# Patient Record
Sex: Male | Born: 1960
Health system: Southern US, Community
[De-identification: ages and names within clinical notes are randomized; demographics above are authoritative.]

## PROBLEM LIST (undated history)

## (undated) DIAGNOSIS — D500 Iron deficiency anemia secondary to blood loss (chronic): Secondary | ICD-10-CM

## (undated) DIAGNOSIS — H359 Unspecified retinal disorder: Secondary | ICD-10-CM

## (undated) DIAGNOSIS — C641 Malignant neoplasm of right kidney, except renal pelvis: Secondary | ICD-10-CM

## (undated) DIAGNOSIS — Z923 Personal history of irradiation: Secondary | ICD-10-CM

## (undated) DIAGNOSIS — M170 Bilateral primary osteoarthritis of knee: Secondary | ICD-10-CM

## (undated) DIAGNOSIS — K4090 Unilateral inguinal hernia, without obstruction or gangrene, not specified as recurrent: Secondary | ICD-10-CM

## (undated) DIAGNOSIS — K2980 Duodenitis without bleeding: Secondary | ICD-10-CM

## (undated) DIAGNOSIS — Z7189 Other specified counseling: Secondary | ICD-10-CM

## (undated) DIAGNOSIS — J189 Pneumonia, unspecified organism: Secondary | ICD-10-CM

## (undated) HISTORY — PX: HERNIA REPAIR: SHX51

## (undated) HISTORY — DX: Personal history of irradiation: Z92.3

## (undated) HISTORY — DX: Other specified counseling: Z71.89

## (undated) HISTORY — DX: Malignant neoplasm of right kidney, except renal pelvis: C64.1

## (undated) HISTORY — DX: Unilateral inguinal hernia, without obstruction or gangrene, not specified as recurrent: K40.90

## (undated) HISTORY — PX: INGUINAL HERNIA REPAIR: SUR1180

## (undated) HISTORY — DX: Bilateral primary osteoarthritis of knee: M17.0

## (undated) HISTORY — DX: Iron deficiency anemia secondary to blood loss (chronic): D50.0

---

## 2016-05-24 ENCOUNTER — Emergency Department (INDEPENDENT_AMBULATORY_CARE_PROVIDER_SITE_OTHER)
Admission: EM | Admit: 2016-05-24 | Discharge: 2016-05-24 | Disposition: A | Discharge status: Home / Self Care | Payer: BLUE CROSS/BLUE SHIELD | Source: Home / Self Care | Attending: Family Medicine | Admitting: Family Medicine

## 2016-05-24 ENCOUNTER — Encounter: Payer: Self-pay | Admitting: *Deleted

## 2016-05-24 DIAGNOSIS — L255 Unspecified contact dermatitis due to plants, except food: Secondary | ICD-10-CM

## 2016-05-24 MED ORDER — PREDNISONE 20 MG PO TABS: 20.0000 mg | ORAL_TABLET | Freq: Two times a day (BID) | ORAL | Status: DC

## 2016-05-24 MED ORDER — METHYLPREDNISOLONE SODIUM SUCC 125 MG IJ SOLR
80.0000 mg | Freq: Once | INTRAMUSCULAR | Status: AC
  Administered 2016-05-24: 80 mg via INTRAMUSCULAR

## 2016-05-24 NOTE — ED Provider Notes (Signed)
CSN: [id number]     Arrival date & time 05/24/16  1635 History   First MD Initiated Contact with Patient 05/24/16 1806     Chief Complaint  Patient presents with  . Rash      HPI Comments: Patient came into contact with poison ivy on his arms 4 days ago, and has persistent pruritic rash.  Patient is a 55 y.o. male presenting with poison ivy. The history is provided by the patient.  Poison Karlene Einstein This is a new problem. Episode onset: 4 days ago. The problem occurs constantly. The problem has been gradually worsening. Exacerbated by: sweating. Nothing relieves the symptoms. Treatments tried: anti itch cream. The treatment provided no relief.    History reviewed. No pertinent past medical history. Past Surgical History  Procedure Laterality Date  . Hernia repair     Family History  Problem Relation Age of Onset  . Heart disease Mother   . Diabetes Mother   . Heart disease Father    Social History  Substance Use Topics  . Smoking status: Never Smoker   . Smokeless tobacco: Never Used  . Alcohol Use: Yes     Comment: 0-1    Review of Systems  All other systems reviewed and are negative.   Allergies  Review of patient's allergies indicates no known allergies.  Home Medications   Prior to Admission medications   Medication Sig Start Date End Date Taking? Authorizing Provider  predniSONE (DELTASONE) 20 MG tablet Take 1 tablet (20 mg total) by mouth 2 (two) times daily. Take with food. 05/24/16   Kandra Nicolas, MD   Meds Ordered and Administered this Visit   Medications  methylPREDNISolone sodium succinate (SOLU-MEDROL) 125 mg/2 mL injection 80 mg (not administered)    BP 143/76 mmHg  Pulse 69  Temp(Src) 98.7 F (37.1 C) (Oral)  Ht 6' (1.829 m)  Wt 250 lb (113.399 kg)  BMI 33.90 kg/m2  SpO2 96% No data found.   Physical Exam  Constitutional: He is oriented to person, place, and time. He appears well-developed and well-nourished. He appears distressed.  HENT:    Head: Normocephalic.  Mouth/Throat: Oropharynx is clear and moist.  Eyes: Conjunctivae are normal. Pupils are equal, round, and reactive to light.  Neurological: He is alert and oriented to person, place, and time.  Skin: Skin is warm and dry. Rash noted. Rash is not pustular and not vesicular.     Scattered vesicular erythematous lesions on forearms and hands.  No evidence cellulitis  Nursing note and vitals reviewed.   ED Course  Procedures none  MDM   1. Rhus dermatitis    DepoMedrol 80mg  IM  Begin prednisone burtst Tuesday, May 25, 2016. May take an antihistamine such as Benadryl or Zyrtec as needed for itching. If lesions begin to weep, may apply soaks with DomeBoro solution (available non-prescription).    Kandra Nicolas, MD 05/24/16 Vernelle Emerald

## 2016-05-24 NOTE — Discharge Instructions (Signed)
Begin prednisone Tuesday, May 25, 2016. May take an antihistamine such as Benadryl or Zyrtec as needed for itching. If lesions begin to weep, may apply soaks with DomeBoro solution (available non-prescription).   Poison [employer] ivy is a rash caused by touching the leaves of the poison ivy plant. The rash often shows up 48 hours later. You might just have bumps, redness, and itching. Sometimes, blisters appear and break open. Your eyes may get puffy (swollen). Poison ivy often heals in 2 to 3 weeks without treatment. HOME CARE  If you touch poison ivy:  Wash your skin with soap and water right away. Wash under your fingernails. Do not rub the skin very hard.  Wash any clothes you were wearing.  Avoid poison ivy in the future. Poison ivy has 3 leaves on a stem.  Use medicine to help with itching as told by your doctor. Do not drive when you take this medicine.  Keep open sores dry, clean, and covered with a bandage and medicated cream, if needed.  Ask your doctor about medicine for children. GET HELP RIGHT AWAY IF:  You have open sores.  Redness spreads beyond the area of the rash.  There is yellowish white fluid (pus) coming from the rash.  Pain gets worse.  You have a temperature by mouth above 102 F (38.9 C), not controlled by medicine. MAKE SURE YOU:  Understand these instructions.  Will watch your condition.  Will get help right away if you are not doing well or get worse.   This information is not intended to replace advice given to you by your health care provider. Make sure you discuss any questions you have with your health care provider.   Document Released: 12/25/2010 Document Revised: 02/14/2012 Document Reviewed: 04/30/2015 Elsevier Interactive Patient Education [employer].

## 2016-05-24 NOTE — ED Notes (Signed)
Pt c/o possible poison ivy rash to both arms x 3 days. Blisters @ some sites. Used otc anti-itch cream with minimal relief. He is going out of the country in 6 days.

## 2016-06-06 ENCOUNTER — Encounter: Payer: Self-pay | Admitting: Emergency Medicine

## 2016-06-06 ENCOUNTER — Emergency Department (INDEPENDENT_AMBULATORY_CARE_PROVIDER_SITE_OTHER)
Admission: EM | Admit: 2016-06-06 | Discharge: 2016-06-06 | Disposition: A | Discharge status: Home / Self Care | Payer: BLUE CROSS/BLUE SHIELD | Source: Home / Self Care | Attending: Family Medicine | Admitting: Family Medicine

## 2016-06-06 DIAGNOSIS — L259 Unspecified contact dermatitis, unspecified cause: Secondary | ICD-10-CM

## 2016-06-06 MED ORDER — DOXYCYCLINE HYCLATE 100 MG PO CAPS: 100.0000 mg | ORAL_CAPSULE | Freq: Two times a day (BID) | ORAL | Status: DC

## 2016-06-06 MED ORDER — HYDROXYZINE HCL 25 MG PO TABS: 25.0000 mg | ORAL_TABLET | Freq: Four times a day (QID) | ORAL | Status: DC | PRN

## 2016-06-06 MED ORDER — CLOBETASOL PROPIONATE 0.05 % EX CREA: 1.0000 "application " | TOPICAL_CREAM | Freq: Two times a day (BID) | CUTANEOUS | Status: DC

## 2016-06-06 NOTE — ED Provider Notes (Signed)
CSN: [id number]     Arrival date & time 06/06/16  1107 History   First MD Initiated Contact with Patient 06/06/16 1131     Chief Complaint  Patient presents with  . Rash   (Consider location/radiation/quality/duration/timing/severity/associated sxs/prior Treatment) HPI Alexander Fry is a 55 y.o. male presenting to UC with c/o persistent erythematous, moderately pruritic rash to arms and now a few spots on his legs. Pt notes he was seen at [facility] 2 weeks ago, given a shot of steroids and discharged with prednisone but denies relief of symptoms.  He notes he did spend the last 1 week in [place] and was staying in a hotel so was unable to wash clothes but does not believe rash is from being down there. Denies fever, chills, n/v/d. Denies difficulty breathing or swallowing. No new soaps, lotions, or medications.   History reviewed. No pertinent past medical history. Past Surgical History  Procedure Laterality Date  . Hernia repair     Family History  Problem Relation Age of Onset  . Heart disease Mother   . Diabetes Mother   . Heart disease Father    Social History  Substance Use Topics  . Smoking status: Never Smoker   . Smokeless tobacco: Never Used  . Alcohol Use: Yes     Comment: 0-1    Review of Systems  Constitutional: Negative for fever and chills.  Respiratory: Negative for chest tightness, shortness of breath, wheezing and stridor.   Gastrointestinal: Negative for nausea, vomiting and diarrhea.    Allergies  Review of patient's allergies indicates no known allergies.  Home Medications   Prior to Admission medications   Medication Sig Start Date End Date Taking? Authorizing Provider  clobetasol cream (TEMOVATE) [id number] % Apply 1 application topically 2 (two) times daily. 06/06/16   Noland Fordyce, PA-C  doxycycline (VIBRAMYCIN) 100 MG capsule Take 1 capsule (100 mg total) by mouth 2 (two) times daily. One po bid x 7 days 06/06/16   Noland Fordyce, PA-C  hydrOXYzine  (ATARAX/VISTARIL) 25 MG tablet Take 1 tablet (25 mg total) by mouth every 6 (six) hours as needed for itching. 06/06/16   Noland Fordyce, PA-C  predniSONE (DELTASONE) 20 MG tablet Take 1 tablet (20 mg total) by mouth 2 (two) times daily. Take with food. 05/24/16   Kandra Nicolas, MD   Meds Ordered and Administered this Visit  Medications - No data to display  BP 122/77 mmHg  Pulse 78  Temp(Src) 98.6 F (37 C) (Oral)  Ht 6' (1.829 m)  Wt 244 lb 8 oz (110.904 kg)  BMI 33.15 kg/m2  SpO2 93% No data found.   Physical Exam  Constitutional: He is oriented to person, place, and time. He appears well-developed and well-nourished.  HENT:  Head: Normocephalic and atraumatic.  Mouth/Throat: Oropharynx is clear and moist.  Eyes: EOM are normal.  Neck: Normal range of motion.  Cardiovascular: Normal rate.   Pulmonary/Chest: Effort normal. No stridor. No respiratory distress.  Musculoskeletal: Normal range of motion.  Neurological: He is alert and oriented to person, place, and time.  Skin: Skin is warm and dry. Rash noted. There is erythema.  Scattered erythematous vesicular rash on both forearms and sparse appearance on lower legs. Worst of rash on Left wrist, multiple grouped vesicles, dried yellow discharge, 2in diameter of grouped lesions.  Psychiatric: He has a normal mood and affect. His behavior is normal.  Nursing note and vitals reviewed.   ED Course  Procedures (including critical care time)  Labs Review Labs Reviewed - No data to display  Imaging Review No results found.    MDM   1. Contact dermatitis    Pt c/o persistent erythematous pruritic rash reports mild relief of itching from injection of depomedrol but declining additional IM steroids or oral steroids as he states "they do not work"  Encouraged to make sure all clothing and linens including sheets and towels that may have oils on be washed. Keep rashes clean with soap and water.  Rx: clobetasol cream,  doxycycline to cover for secondary skin infection on Left wrist, and atarax   F/u with PCP in 1 week if not improving, sooner if worsening. Patient verbalized understanding and agreement with treatment plan.    Noland Fordyce, PA-C 06/06/16 1440

## 2016-06-06 NOTE — Discharge Instructions (Signed)
Atarax (hydroxizine) is an antihistamine that can be taken to help with itching and inflammation. This medication can cause drowsiness so do not drive or drink alcohol while taking.     Contact Dermatitis Dermatitis is redness, soreness, and swelling (inflammation) of the skin. Contact dermatitis is a reaction to certain substances that touch the skin. There are two types of contact dermatitis:   Irritant contact dermatitis. This type is caused by something that irritates your skin, such as dry hands from washing them too much. This type does not require previous exposure to the substance for a reaction to occur. This type is more common.  Allergic contact dermatitis. This type is caused by a substance that you are allergic to, such as a nickel allergy or poison ivy. This type only occurs if you have been exposed to the substance (allergen) before. Upon a repeat exposure, your body reacts to the substance. This type is less common. CAUSES  Many different substances can cause contact dermatitis. Irritant contact dermatitis is most commonly caused by exposure to:   Makeup.   Soaps.   Detergents.   Bleaches.   Acids.   Metal salts, such as nickel.  Allergic contact dermatitis is most commonly caused by exposure to:   Poisonous plants.   Chemicals.   Jewelry.   Latex.   Medicines.   Preservatives in products, such as clothing.  RISK FACTORS This condition is more likely to develop in:   People who have jobs that expose them to irritants or allergens.  People who have certain medical conditions, such as asthma or eczema.  SYMPTOMS  Symptoms of this condition may occur anywhere on your body where the irritant has touched you or is touched by you. Symptoms include:  Dryness or flaking.   Redness.   Cracks.   Itching.   Pain or a burning feeling.   Blisters.  Drainage of small amounts of blood or clear fluid from skin cracks. With allergic contact  dermatitis, there may also be swelling in areas such as the eyelids, mouth, or genitals.  DIAGNOSIS  This condition is diagnosed with a medical history and physical exam. A patch skin test may be performed to help determine the cause. If the condition is related to your job, you may need to see an occupational medicine specialist. TREATMENT Treatment for this condition includes figuring out what caused the reaction and protecting your skin from further contact. Treatment may also include:   Steroid creams or ointments. Oral steroid medicines may be needed in more severe cases.  Antibiotics or antibacterial ointments, if a skin infection is present.  Antihistamine lotion or an antihistamine taken by mouth to ease itching.  A bandage (dressing). HOME CARE INSTRUCTIONS Skin Care  Moisturize your skin as needed.   Apply cool compresses to the affected areas.  Try taking a bath with:  Epsom salts. Follow the instructions on the packaging. You can get these at your local pharmacy or grocery store.  Baking soda. Pour a small amount into the bath as directed by your health care provider.  Colloidal oatmeal. Follow the instructions on the packaging. You can get this at your local pharmacy or grocery store.  Try applying baking soda paste to your skin. Stir water into baking soda until it reaches a paste-like consistency.  Do not scratch your skin.  Bathe less frequently, such as every other day.  Bathe in lukewarm water. Avoid using hot water. Medicines  Take or apply over-the-counter and prescription medicines only  as told by your health care provider.   If you were prescribed an antibiotic medicine, take or apply your antibiotic as told by your health care provider. Do not stop using the antibiotic even if your condition starts to improve. General Instructions  Keep all follow-up visits as told by your health care provider. This is important.  Avoid the substance that caused  your reaction. If you do not know what caused it, keep a journal to try to track what caused it. Write down:  What you eat.  What cosmetic products you use.  What you drink.  What you wear in the affected area. This includes jewelry.  If you were given a dressing, take care of it as told by your health care provider. This includes when to change and remove it. SEEK MEDICAL CARE IF:   Your condition does not improve with treatment.  Your condition gets worse.  You have signs of infection such as swelling, tenderness, redness, soreness, or warmth in the affected area.  You have a fever.  You have new symptoms. SEEK IMMEDIATE MEDICAL CARE IF:   You have a severe headache, neck pain, or neck stiffness.  You vomit.  You feel very sleepy.  You notice red streaks coming from the affected area.  Your bone or joint underneath the affected area becomes painful after the skin has healed.  The affected area turns darker.  You have difficulty breathing.   This information is not intended to replace advice given to you by your health care provider. Make sure you discuss any questions you have with your health care provider.   Document Released: 11/19/2000 Document Revised: 08/13/2015 Document Reviewed: 04/09/2015 Elsevier Interactive Patient Education 2016 [employer].  Allergies An allergy is when your body reacts to a substance in a way that is not normal. An allergic reaction can happen after you:  Eat something.  Breathe in something.  Touch something. WHAT KINDS OF ALLERGIES ARE THERE? You can be allergic to:  Things that are only around during certain seasons, like molds and pollens.  Foods.  Drugs.  Insects.  Animal dander. WHAT ARE SYMPTOMS OF ALLERGIES?  Puffiness (swelling). This may happen on the lips, face, tongue, mouth, or throat.  Sneezing.  Coughing.  Breathing loudly (wheezing).  Stuffy nose.  Tingling in the mouth.  A  rash.  Itching.  Itchy, red, puffy areas of skin (hives).  Watery eyes.  Throwing up (vomiting).  Watery poop (diarrhea).  Dizziness.  Feeling faint or fainting.  Trouble breathing or swallowing.  A tight feeling in the chest.  A fast heartbeat. HOW ARE ALLERGIES DIAGNOSED? Allergies can be diagnosed with:  A medical and family history.  Skin tests.  Blood tests.  A food diary. A food diary is a record of all the foods, drinks, and symptoms you have each day.  The results of an elimination diet. This diet involves making sure not to eat certain foods and then seeing what happens when you start eating them again. HOW ARE ALLERGIES TREATED? There is no cure for allergies, but allergic reactions can be treated with medicine. Severe reactions usually need to be treated at a hospital.  HOW CAN REACTIONS BE PREVENTED? The best way to prevent an allergic reaction is to avoid the thing you are allergic to. Allergy shots and medicines can also help prevent reactions in some cases.   This information is not intended to replace advice given to you by your health care provider. Make sure  you discuss any questions you have with your health care provider.   Document Released: 03/19/2013 Document Revised: 12/13/2014 Document Reviewed: 09/03/2014 Elsevier Interactive Patient Education 2016 [employer]  Allergy Skin Testing WHY AM I HAVING THIS TEST? Allergy skin testing is done to check whether you have an allergy to something. Testing may be done in one of two ways:  Injecting a small amount of the substance you may be allergic to (allergen).  Applying patches to your skin. Your health care provider will determine the results of your test by checking for an allergic reaction on the skin where the allergen was injected or where the patches were applied.  HOW DO I PREPARE FOR THE TEST?  Let your health care provider know about all medicines you are taking, including vitamins,  herbs, eye drops, creams, and over-the-counter medicines. Some medicines can affect test results. Your health care provider will let you know when to stop taking those medicines and when you can begin taking them again.  If you are having a patch test:  Do not apply ointments, creams, or lotion to the skin where the patch will be placed. Usually, the patches are placed on your forearm or on your back.  Bring any items that you think you are allergic to, such as cosmetics, soaps, and perfume. WILL I NEED TO DO ANYTHING AT HOME? If you will receive an injection, you will not need to do anything at home. If patches will be applied to your skin, you will need to:  Wear them for 48 hours.  Return to your health care provider's office to have them removed. Do not remove them yourself.  Avoid bathing and activities that cause heavy sweating until after the patches are removed. WHAT ARE THE REFERENCE RANGES? Reference ranges are considered healthy ranges established after testing a large group of healthy people. Reference ranges may vary among different people, labs, and hospitals.  The reference range for allergy skin testing is a swollen area of skin (wheal) less than 65mm in diameter, with surrounding redness and swelling (flare) less than 18mm in diameter.  WHAT DO THE RESULTS MEAN?  A result within the reference range means you are probably not allergic to the allergen.  A result in which the wheal is 30mm or more and the flare is 35mm or more means you are likely allergic to the allergen. Your health care provider will consider the results of your test in addition to your symptoms before diagnosing you with an allergy. Talk with your health care provider to discuss your results, treatment options, and if necessary, the need for more tests. Talk with your health care provider if you have any questions about your results.   This information is not intended to replace advice given to you by your  health care provider. Make sure you discuss any questions you have with your health care provider.   Document Released: 12/15/2004 Document Revised: 12/13/2014 Document Reviewed: 09/03/2014 Elsevier Interactive Patient Education [employer].

## 2016-06-06 NOTE — ED Notes (Signed)
Pt c/o rash unsure if it's poison oak or poison ivy x 2 weeks, itching, no relief, no change w/Prednisone 20mg .  Rash in on arms.

## 2016-12-09 ENCOUNTER — Encounter: Payer: Self-pay | Admitting: Emergency Medicine

## 2016-12-09 ENCOUNTER — Emergency Department (INDEPENDENT_AMBULATORY_CARE_PROVIDER_SITE_OTHER): Payer: BLUE CROSS/BLUE SHIELD

## 2016-12-09 ENCOUNTER — Emergency Department (INDEPENDENT_AMBULATORY_CARE_PROVIDER_SITE_OTHER)
Admission: EM | Admit: 2016-12-09 | Discharge: 2016-12-09 | Disposition: A | Discharge status: Home / Self Care | Payer: BLUE CROSS/BLUE SHIELD | Source: Home / Self Care | Attending: Family Medicine | Admitting: Family Medicine

## 2016-12-09 DIAGNOSIS — Z760 Encounter for issue of repeat prescription: Secondary | ICD-10-CM

## 2016-12-09 DIAGNOSIS — R9389 Abnormal findings on diagnostic imaging of other specified body structures: Secondary | ICD-10-CM

## 2016-12-09 DIAGNOSIS — R05 Cough: Secondary | ICD-10-CM | POA: Diagnosis not present

## 2016-12-09 DIAGNOSIS — R053 Chronic cough: Secondary | ICD-10-CM

## 2016-12-09 DIAGNOSIS — S39012A Strain of muscle, fascia and tendon of lower back, initial encounter: Secondary | ICD-10-CM

## 2016-12-09 MED ORDER — HYDROCODONE-ACETAMINOPHEN 5-325 MG PO TABS: 1.0000 | ORAL_TABLET | Freq: Four times a day (QID) | ORAL | 0 refills | Status: DC | PRN

## 2016-12-09 MED ORDER — TERBINAFINE HCL 250 MG PO TABS: 250.0000 mg | ORAL_TABLET | Freq: Every day | ORAL | 0 refills | Status: DC

## 2016-12-09 MED ORDER — BENZONATATE 100 MG PO CAPS: 100.0000 mg | ORAL_CAPSULE | Freq: Three times a day (TID) | ORAL | 0 refills | Status: DC

## 2016-12-09 MED ORDER — CYCLOBENZAPRINE HCL 10 MG PO TABS: 10.0000 mg | ORAL_TABLET | Freq: Two times a day (BID) | ORAL | 0 refills | Status: DC | PRN

## 2016-12-09 NOTE — ED Provider Notes (Signed)
CSN: [id number]     Arrival date & time 12/09/16  [id number] History   First MD Initiated Contact with Patient 12/09/16 [phone]     Chief Complaint  Patient presents with  . Back Pain  . Cough   (Consider location/radiation/quality/duration/timing/severity/associated sxs/prior Treatment) HPI  Taliq Chilcoat is a 56 y.o. male presenting to UC with c/o cough that is mildly productive for about 3 months, and now Right lower back pain that is aching and sore, sharp on occasions while lying down. Back pain has been present for about 1 week, worse yesterday.  Hx of back strain a few years ago. Denies radiation of pain or numbness in arms or legs.  For the cough, he has taken Benadryl, which seems to have helped but cough is becoming more productive.  Denies fever, chills, n/v/d.   Denies chest pain or SOB. No hx of asthma or COPD. Denies change in bowel or bladder habits.  Pt also requesting a refill on his terbinafine as he has a hx of toenail fungus that has improved on Right foot but he feels he needs more medication to help with the nail on his Left foot.  Pt is new to area and has not established with a PCP yet.   History reviewed. No pertinent past medical history. Past Surgical History:  Procedure Laterality Date  . HERNIA REPAIR     Family History  Problem Relation Age of Onset  . Heart disease Mother   . Diabetes Mother   . Heart disease Father    Social History  Substance Use Topics  . Smoking status: Never Smoker  . Smokeless tobacco: Never Used  . Alcohol use Yes     Comment: 0-1    Review of Systems  Constitutional: Negative for chills and fever.  HENT: Positive for congestion and postnasal drip. Negative for ear pain, sore throat, trouble swallowing and voice change.   Respiratory: Positive for cough. Negative for shortness of breath.   Cardiovascular: Negative for chest pain and palpitations.  Gastrointestinal: Negative for abdominal pain, diarrhea, nausea and vomiting.   Musculoskeletal: Positive for back pain and myalgias. Negative for arthralgias.  Skin: Negative for rash.  Neurological: Negative for dizziness, light-headedness and headaches.    Allergies  Patient has no known allergies.  Home Medications   Prior to Admission medications   Medication Sig Start Date End Date Taking? Authorizing Provider  benzonatate (TESSALON) 100 MG capsule Take 1-2 capsules (100-200 mg total) by mouth every 8 (eight) hours. 12/09/16   Noland Fordyce, PA-C  cyclobenzaprine (FLEXERIL) 10 MG tablet Take 1 tablet (10 mg total) by mouth 2 (two) times daily as needed. 12/09/16   Noland Fordyce, PA-C  HYDROcodone-acetaminophen (NORCO/VICODIN) 5-325 MG tablet Take 1-2 tablets by mouth every 6 (six) hours as needed for severe pain. 12/09/16   Noland Fordyce, PA-C  terbinafine (LAMISIL) 250 MG tablet Take 1 tablet (250 mg total) by mouth daily. 12/09/16   Noland Fordyce, PA-C   Meds Ordered and Administered this Visit  Medications - No data to display  Ht 6' (1.829 m)   Wt 234 lb (106.1 kg)   BMI 31.74 kg/m  No data found.   Physical Exam  Constitutional: He is oriented to person, place, and time. He appears well-developed and well-nourished. No distress.  HENT:  Head: Normocephalic and atraumatic.  Right Ear: Tympanic membrane normal.  Left Ear: Tympanic membrane normal.  Nose: Nose normal.  Mouth/Throat: Uvula is midline, oropharynx is clear and moist and mucous  membranes are normal.  Eyes: EOM are normal.  Neck: Normal range of motion. Neck supple.  Cardiovascular: Normal rate and regular rhythm.   Pulmonary/Chest: Effort normal and breath sounds normal. No stridor. No respiratory distress. He has no wheezes. He has no rales.  Musculoskeletal: Normal range of motion. He exhibits tenderness. He exhibits no edema.  No midline spinal tenderness. Tenderness to Right lower lumbar muscles.   Lymphadenopathy:    He has no cervical adenopathy.  Neurological: He is alert and  oriented to person, place, and time.  Skin: Skin is warm and dry. No rash noted. He is not diaphoretic. No erythema.  Psychiatric: He has a normal mood and affect. His behavior is normal.  Nursing note and vitals reviewed.   Urgent Care Course   Clinical Course     Procedures (including critical care time)  Labs Review Labs Reviewed - No data to display  Imaging Review Dg Chest 2 View  Result Date: 12/09/2016 CLINICAL DATA:  Cough for past 3 months EXAM: CHEST  2 VIEW COMPARISON:  None in PACs FINDINGS: The lungs are adequately inflated. There is hilar prominence predominantly on the right but to a lesser extent on the left. The perihilar lung markings are increased as well. The cardiac silhouette is enlarged. The trachea is midline. There is no pleural effusion. There is soft tissue fullness along the left lower lateral ribcage that is of uncertain etiology. There is faint calcification in the wall of the aortic arch. IMPRESSION: Bilateral hilar prominence with perihilar interstitial prominence consistent with lymphadenopathy with mild interstitial infiltrate or noncardiac edema. Given the patient's chronic persistent cough, contrast-enhanced chest CT scanning is recommended to exclude occult malignancy and to further evaluate the mediastinum, hila, and pulmonary interstitium. Electronically Signed   By: David  Martinique M.D.   On: 12/09/2016 10:08     MDM   1. Chronic cough   2. Low back strain, initial encounter   3. Abnormal chest x-ray   4. Medication refill    Pt c/o cough for 3 months and now has Right lower back pain he believes from his cough.  Cough somewhat improving with Benadryl.  CXR: bilateral hilar prominence c/w lymphadenopathy Recommend contrast-enhanced chest CT   Consulted with Dr. Madilyn Fireman, Primary Care, recommends pt establish care next early next week with PCP for CT scan and further evaluation.  Pt provided copy of CXR results and strongly advised to f/u next  week with PCP for further evaluation and management of cough.  Back pain likely due to muscle strain, will treat with pain medication and muscle relaxer. May have ibuprofen OTC.  Patient verbalized understanding and agreement with treatment plan.     Noland Fordyce, PA-C 12/09/16 [place], PA-C 12/09/16 1154

## 2016-12-09 NOTE — Discharge Instructions (Signed)
°  Norco/Vicodin (hydrocodone-acetaminophen) is a narcotic pain medication, do not combine these medications with others containing tylenol. While taking, do not drink alcohol, drive, or perform any other activities that requires focus while taking these medications.   Flexeril is a muscle relaxer and may cause drowsiness. Do not drink alcohol, drive, or operate heavy machinery while taking.

## 2016-12-09 NOTE — ED Triage Notes (Signed)
LBP, mostly right side x 1 week Cough dry x 3 months

## 2016-12-10 ENCOUNTER — Ambulatory Visit (INDEPENDENT_AMBULATORY_CARE_PROVIDER_SITE_OTHER): Payer: BLUE CROSS/BLUE SHIELD | Admitting: Physician Assistant

## 2016-12-10 ENCOUNTER — Ambulatory Visit (INDEPENDENT_AMBULATORY_CARE_PROVIDER_SITE_OTHER): Payer: BLUE CROSS/BLUE SHIELD

## 2016-12-10 ENCOUNTER — Encounter: Payer: Self-pay | Admitting: Physician Assistant

## 2016-12-10 VITALS — BP 116/74 | HR 101 | Ht 71.0 in | Wt 238.0 lb

## 2016-12-10 DIAGNOSIS — K8020 Calculus of gallbladder without cholecystitis without obstruction: Secondary | ICD-10-CM

## 2016-12-10 DIAGNOSIS — K769 Liver disease, unspecified: Secondary | ICD-10-CM | POA: Diagnosis not present

## 2016-12-10 DIAGNOSIS — R9389 Abnormal findings on diagnostic imaging of other specified body structures: Secondary | ICD-10-CM | POA: Insufficient documentation

## 2016-12-10 DIAGNOSIS — N401 Enlarged prostate with lower urinary tract symptoms: Secondary | ICD-10-CM | POA: Diagnosis not present

## 2016-12-10 DIAGNOSIS — R590 Localized enlarged lymph nodes: Secondary | ICD-10-CM | POA: Insufficient documentation

## 2016-12-10 DIAGNOSIS — R938 Abnormal findings on diagnostic imaging of other specified body structures: Secondary | ICD-10-CM

## 2016-12-10 DIAGNOSIS — Z0000 Encounter for general adult medical examination without abnormal findings: Secondary | ICD-10-CM

## 2016-12-10 DIAGNOSIS — R351 Nocturia: Secondary | ICD-10-CM

## 2016-12-10 DIAGNOSIS — R918 Other nonspecific abnormal finding of lung field: Secondary | ICD-10-CM | POA: Insufficient documentation

## 2016-12-10 LAB — TSH: TSH: 3.21 mIU/L (ref 0.40–4.50)

## 2016-12-10 LAB — COMPREHENSIVE METABOLIC PANEL
ALK PHOS: 103 U/L (ref 40–115)
ALT: 15 U/L (ref 9–46)
AST: 17 U/L (ref 10–35)
Albumin: 3.4 g/dL — ABNORMAL LOW (ref 3.6–5.1)
BUN: 16 mg/dL (ref 7–25)
CO2: 26 mmol/L (ref 20–31)
CREATININE: 0.64 mg/dL — AB (ref 0.70–1.33)
Calcium: 9 mg/dL (ref 8.6–10.3)
Chloride: 99 mmol/L (ref 98–110)
Glucose, Bld: 80 mg/dL (ref 65–99)
Potassium: 4.1 mmol/L (ref 3.5–5.3)
SODIUM: 138 mmol/L (ref 135–146)
TOTAL PROTEIN: 6.9 g/dL (ref 6.1–8.1)
Total Bilirubin: 0.6 mg/dL (ref 0.2–1.2)

## 2016-12-10 LAB — LIPID PANEL
CHOL/HDL RATIO: 3.9 ratio (ref <5.0)
Cholesterol: 153 mg/dL (ref <200)
HDL: 39 mg/dL — ABNORMAL LOW (ref >40)
LDL CALC: 95 mg/dL (ref <100)
TRIGLYCERIDES: 93 mg/dL (ref <150)
VLDL: 19 mg/dL (ref <30)

## 2016-12-10 LAB — CBC
HEMATOCRIT: 35.2 % — AB (ref 38.5–50.0)
Hemoglobin: 11.5 g/dL — ABNORMAL LOW (ref 13.2–17.1)
MCH: 26.2 pg — AB (ref 27.0–33.0)
MCHC: 32.7 g/dL (ref 32.0–36.0)
MCV: 80.2 fL (ref 80.0–100.0)
MPV: 8.5 fL (ref 7.5–12.5)
Platelets: 418 10*3/uL — ABNORMAL HIGH (ref 140–400)
RBC: 4.39 MIL/uL (ref 4.20–5.80)
RDW: 14.5 % (ref 11.0–15.0)
WBC: 9.3 10*3/uL (ref 3.8–10.8)

## 2016-12-10 LAB — HEMOGLOBIN A1C
HEMOGLOBIN A1C: 5.4 % (ref <5.7)
Mean Plasma Glucose: 108 mg/dL

## 2016-12-10 MED ORDER — TAMSULOSIN HCL 0.4 MG PO CAPS: 0.4000 mg | ORAL_CAPSULE | Freq: Every day | ORAL | 3 refills | Status: DC

## 2016-12-10 MED ORDER — IOPAMIDOL (ISOVUE-300) INJECTION 61%
75.0000 mL | Freq: Once | INTRAVENOUS | Status: AC | PRN
  Administered 2016-12-10: 75 mL via INTRAVENOUS

## 2016-12-10 NOTE — Patient Instructions (Signed)
Please go downstairs for your Cat Scan Afterwards, go to the lab for blood work  Benign Prostatic Hyperplasia An enlarged prostate (benign prostatic hyperplasia) is common in older men. You may experience the following:  Weak urine stream.  Dribbling.  Feeling like the bladder has not emptied completely.  Difficulty starting urination.  Getting up frequently at night to urinate.  Urinating more frequently during the day. HOME CARE INSTRUCTIONS  Monitor your prostatic hyperplasia for any changes. The following actions may help to alleviate any discomfort you are experiencing:  Give yourself time when you urinate.  Stay away from alcohol.  Avoid beverages containing caffeine, such as coffee, tea, and colas, because they can make the problem worse.  Avoid decongestants, antihistamines, and some prescription medicines that can make the problem worse.  Follow up with your health care provider for further treatment as recommended. SEEK MEDICAL CARE IF:  You are experiencing progressive difficulty voiding.  Your urine stream is progressively getting narrower.  You are awaking from sleep with the urge to void more frequently.  You are constantly feeling the need to void.  You experience loss of urine, especially in small amounts. SEEK IMMEDIATE MEDICAL CARE IF:   You develop increased pain with urination or are unable to urinate.  You develop severe abdominal pain, vomiting, a high fever, or fainting.  You develop back pain or blood in your urine. MAKE SURE YOU:   Understand these instructions.  Will watch your condition.  Will get help right away if you are not doing well or get worse. This information is not intended to replace advice given to you by your health care provider. Make sure you discuss any questions you have with your health care provider. Document Released: 11/22/2005 Document Revised: 12/13/2014 Document Reviewed: 04/24/2013 Elsevier Interactive  Patient Education  2017 [employer].

## 2016-12-10 NOTE — Progress Notes (Signed)
HPI:                                                                Alexander Fry is a 56 y.o. male who presents to [facility]: [place] today to establish care and follow-up on an abnormal CXR  Patient was seen yesterday in urgent care for a chronic cough x 3 months. CXR showed bilateral hilar and perihilar adenopathy and CT chest was recommended.  Patient also complains of urinary symptoms. Endorses nocturia and difficulty starting to void.   Patient denies any other medical problems.  Patient recently relocated from [place]. He has not seen a PCP since 2016. He travels frequently for business.  Health Maintenance Health Maintenance  Topic Date Due  . Hepatitis C Screening  1961-12-03  . HIV Screening  07/22/1975  . COLONOSCOPY  07/21/2010  . INFLUENZA VACCINE  07/06/2016  . TETANUS/TDAP  12/06/2021  Has never had a colonoscopy  Past Medical History:  Diagnosis Date  . Inguinal hernia of left side without obstruction or gangrene    Past Surgical History:  Procedure Laterality Date  . HERNIA REPAIR    . INGUINAL HERNIA REPAIR Left    Social History  Substance Use Topics  . Smoking status: Never Smoker  . Smokeless tobacco: Never Used  . Alcohol use Yes     Comment: 0-1   family history includes Alcohol abuse in his brother; Diabetes in his mother; Heart disease in his father and mother.  Review of Systems  Constitutional: Negative for chills, fever and malaise/fatigue.  Respiratory: Positive for cough and sputum production.   Cardiovascular: Negative for chest pain and palpitations.  Gastrointestinal: Negative for abdominal pain.  Genitourinary: Positive for frequency. Negative for hematuria.  Musculoskeletal: Positive for back pain and myalgias (left rib).  Skin: Negative.   Neurological: Negative.      Medications: Current Outpatient Prescriptions  Medication Sig Dispense Refill  . benzonatate (TESSALON) 100 MG capsule  Take 1-2 capsules (100-200 mg total) by mouth every 8 (eight) hours. 21 capsule 0  . cyclobenzaprine (FLEXERIL) 10 MG tablet Take 1 tablet (10 mg total) by mouth 2 (two) times daily as needed. 20 tablet 0  . HYDROcodone-acetaminophen (NORCO/VICODIN) 5-325 MG tablet Take 1-2 tablets by mouth every 6 (six) hours as needed for severe pain. 12 tablet 0  . terbinafine (LAMISIL) 250 MG tablet Take 250 mg by mouth daily.    . tamsulosin (FLOMAX) 0.4 MG CAPS capsule Take 1 capsule (0.4 mg total) by mouth daily. 30 capsule 3   No current facility-administered medications for this visit.    No Known Allergies     Objective:  BP 116/74   Pulse (!) 101   Ht 5\' 11"  (1.803 m)   Wt 238 lb (108 kg)   BMI 33.19 kg/m  Gen: well-groomed, cooperative, not ill-appearing, no distress HEENT: normal conjunctiva, TM's clear, oropharynx clear, moist mucus membranes, no thyromegaly or tenderness Lungs: Normal work of breathing, clear to auscultation bilaterally, no wheezes, rales or rhonchi Heart: Rate 84, regular rhythm, s1 and s2 distinct, no murmurs, clicks or rubs appreciated on this exam, no carotid bruit Abd: Soft. Nondistended, Nontender Neuro: alert and oriented x 3, EOM's intact, PERRLA, DTR's intact MSK: strength 5/5 and  symmetric, normal gait Extremities: distal pulses intact, no peripheral edema Skin: warm and dry, no rashes or lesions on exposed skin Psych: normal affect, pleasant mood, normal speech and thought content    Dg Chest 2 View  Result Date: 12/09/2016 CLINICAL DATA:  Cough for past 3 months EXAM: CHEST  2 VIEW COMPARISON:  None in PACs FINDINGS: The lungs are adequately inflated. There is hilar prominence predominantly on the right but to a lesser extent on the left. The perihilar lung markings are increased as well. The cardiac silhouette is enlarged. The trachea is midline. There is no pleural effusion. There is soft tissue fullness along the left lower lateral ribcage that is of  uncertain etiology. There is faint calcification in the wall of the aortic arch. IMPRESSION: Bilateral hilar prominence with perihilar interstitial prominence consistent with lymphadenopathy with mild interstitial infiltrate or noncardiac edema. Given the patient's chronic persistent cough, contrast-enhanced chest CT scanning is recommended to exclude occult malignancy and to further evaluate the mediastinum, hila, and pulmonary interstitium. Electronically Signed   By: David  Martinique M.D.   On: 12/09/2016 10:08   Ct Chest W Contrast  Result Date: 12/10/2016 CLINICAL DATA:  Cough for past 3 months.  Abnormal chest x-ray EXAM: CT CHEST WITH CONTRAST TECHNIQUE: Multidetector CT imaging of the chest was performed during intravenous contrast administration. CONTRAST:  67mL ISOVUE-300 IOPAMIDOL (ISOVUE-300) INJECTION 61% COMPARISON:  12/09/2016 FINDINGS: Cardiovascular: Heart is normal size. Aorta is normal caliber. Mediastinum/Nodes: Extensive mediastinal and bilateral hilar adenopathy. Subcarinal lymph node has a short axis diameter of 3 cm. Right axillary lymph node has a short axis diameter of 3.1 cm. Left hilar lymph node has a short axis diameter of 2 cm. Posterior mediastinal adenopathy adjacent to the mid esophagus. No axillary adenopathy. Lungs/Pleura: There are numerous bilateral pulmonary nodules concerning for metastatic disease. The largest nodule on the right is in the right lower lobe where there is an elongated nodule measuring 2.6 x 1.3 cm on image 80. Largest nodule on the left is in the superior segment of the left lower lobe posteriorly measuring up to 1.5 cm. There is also a pleural or subpleural masslike area noted posteriorly in the right left lower hemithorax measuring up to 2.8 cm on image 44. No pleural effusions. Upper Abdomen: Low-density lesion in the right hepatic lobe measures 2.5 cm on image 53. Gallstones in the gallbladder. Musculoskeletal: No chest wall abnormality. There is a lucent  lesion partially imaged on the lowest image (number 67 within the L1 vertebral body measuring 3.3 cm. This appears expansile and destructive along the anterior right superior L1 vertebral body. IMPRESSION: Extensive mediastinal and bilateral hilar adenopathy. Numerous bilateral pulmonary nodules. Possible pleural mass in the posterior left lower chest. Low-density lesion within the right hepatic lobe and lytic destructive process in the superior L1 vertebral body. These findings are concerning for metastatic disease of unknown primary. Cholelithiasis. Electronically Signed   By: Rolm Baptise M.D.   On: 12/10/2016 11:08      Assessment and Plan: 56 y.o. male with  Multiple pulmonary nodules determined by computed tomography of lung - CT CHEST W CONTRAST ordered stat today. Received the results prior to signing the chart. 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 - Placed stat referral to Hem/Onc - I called the patient and discussed the results. He is aware that a referral to a cancer specialist has been placed. Instructed him to proceed with getting labs ordered today. Also explained that I may  order further imaging, such as CT Abd/Pel.  Encounter for preventive health examination - PSA, total and free - Comprehensive metabolic panel - Hemoglobin A1c - Hepatitis C antibody - Lipid panel - TSH - VITAMIN D 25 Hydroxy (Vit-D Deficiency, Fractures) - CBC - Ambulatory referral to Gastroenterology for screening colonoscopy  3. Benign prostatic hyperplasia with nocturia - tamsulosin (FLOMAX) 0.4 MG CAPS capsule; Take 1 capsule (0.4 mg total) by mouth daily.  Dispense: 30 capsule; Refill: 3   Orders Placed This Encounter  Procedures  . CT CHEST W CONTRAST    Order Specific Question:   If indicated for the ordered procedure, I authorize the administration of contrast media per Radiology protocol    Answer:   Yes    Order Specific Question:   Reason for Exam  (SYMPTOM  OR DIAGNOSIS REQUIRED)    Answer:   lymphadenopathy on cxr, chronic cough    Order Specific Question:   Preferred imaging location?    Answer:   [facility]  . PSA, total and free  . Comprehensive metabolic panel    Order Specific Question:   Has the patient fasted?    Answer:   No  . Hemoglobin A1c  . Hepatitis C antibody  . Lipid panel    Order Specific Question:   Has the patient fasted?    Answer:   No  . TSH  . VITAMIN D 25 Hydroxy (Vit-D Deficiency, Fractures)  . CBC  . Ambulatory referral to Gastroenterology    Referral Priority:   Routine    Referral Type:   Consultation    Referral Reason:   Specialty Services Required    Referred to Provider:   Jennye Boroughs, MD    Requested Specialty:   Gastroenterology    Number of Visits Requested:   1  . Ambulatory referral to Hematology / Oncology    Referral Priority:   Urgent    Referral Type:   Consultation    Referral Reason:   Specialty Services Required    Number of Visits Requested:   1    Patient education and anticipatory guidance given Patient agrees with treatment plan Follow-up in 2 weeks or sooner as needed  Darlyne Russian PA-C

## 2016-12-11 ENCOUNTER — Encounter: Payer: Self-pay | Admitting: Physician Assistant

## 2016-12-11 LAB — HEPATITIS C ANTIBODY: HCV Ab: NEGATIVE

## 2016-12-11 LAB — VITAMIN D 25 HYDROXY (VIT D DEFICIENCY, FRACTURES): VIT D 25 HYDROXY: 29 ng/mL — AB (ref 30–100)

## 2016-12-14 LAB — PSA, TOTAL AND FREE
PSA, % Free: 18 % — ABNORMAL LOW (ref >25)
PSA, Free: 0.3 ng/mL
PSA, Total: 1.7 ng/mL (ref <=4.0)

## 2016-12-15 ENCOUNTER — Ambulatory Visit: Payer: BLUE CROSS/BLUE SHIELD | Admitting: Hematology & Oncology

## 2016-12-15 ENCOUNTER — Other Ambulatory Visit: Payer: BLUE CROSS/BLUE SHIELD

## 2016-12-15 ENCOUNTER — Ambulatory Visit: Payer: BLUE CROSS/BLUE SHIELD

## 2016-12-20 ENCOUNTER — Encounter: Payer: Self-pay | Admitting: Physician Assistant

## 2016-12-21 ENCOUNTER — Other Ambulatory Visit: Payer: Self-pay | Admitting: Physician Assistant

## 2016-12-21 DIAGNOSIS — M5441 Lumbago with sciatica, right side: Secondary | ICD-10-CM

## 2016-12-21 MED ORDER — PREDNISONE 50 MG PO TABS: 50.0000 mg | ORAL_TABLET | Freq: Every day | ORAL | 0 refills | Status: DC

## 2016-12-22 ENCOUNTER — Other Ambulatory Visit (HOSPITAL_BASED_OUTPATIENT_CLINIC_OR_DEPARTMENT_OTHER): Payer: BLUE CROSS/BLUE SHIELD

## 2016-12-22 ENCOUNTER — Ambulatory Visit (HOSPITAL_BASED_OUTPATIENT_CLINIC_OR_DEPARTMENT_OTHER): Payer: BLUE CROSS/BLUE SHIELD | Admitting: Hematology & Oncology

## 2016-12-22 ENCOUNTER — Encounter: Payer: Self-pay | Admitting: Hematology & Oncology

## 2016-12-22 ENCOUNTER — Ambulatory Visit: Payer: BLUE CROSS/BLUE SHIELD

## 2016-12-22 VITALS — BP 128/60 | HR 94 | Temp 98.1°F | Resp 16 | Wt 230.0 lb

## 2016-12-22 DIAGNOSIS — R918 Other nonspecific abnormal finding of lung field: Secondary | ICD-10-CM

## 2016-12-22 DIAGNOSIS — D649 Anemia, unspecified: Secondary | ICD-10-CM

## 2016-12-22 DIAGNOSIS — R590 Localized enlarged lymph nodes: Secondary | ICD-10-CM | POA: Diagnosis not present

## 2016-12-22 DIAGNOSIS — Z7189 Other specified counseling: Secondary | ICD-10-CM

## 2016-12-22 HISTORY — DX: Other specified counseling: Z71.89

## 2016-12-22 LAB — CMP (CANCER CENTER ONLY)
ALBUMIN: 3.3 g/dL (ref 3.3–5.5)
ALT(SGPT): 25 U/L (ref 10–47)
AST: 24 U/L (ref 11–38)
Alkaline Phosphatase: 134 U/L — ABNORMAL HIGH (ref 26–84)
BUN, Bld: 19 mg/dL (ref 7–22)
CALCIUM: 9.8 mg/dL (ref 8.0–10.3)
CHLORIDE: 101 meq/L (ref 98–108)
CO2: 27 meq/L (ref 18–33)
Creat: 0.8 mg/dl (ref 0.6–1.2)
Glucose, Bld: 94 mg/dL (ref 73–118)
Potassium: 4 mEq/L (ref 3.3–4.7)
Sodium: 140 mEq/L (ref 128–145)
Total Bilirubin: 0.5 mg/dl (ref 0.20–1.60)
Total Protein: 8.6 g/dL — ABNORMAL HIGH (ref 6.4–8.1)

## 2016-12-22 LAB — CBC WITH DIFFERENTIAL (CANCER CENTER ONLY)
BASO#: 0 10*3/uL (ref 0.0–0.2)
BASO%: 0.2 % (ref 0.0–2.0)
EOS%: 0.4 % (ref 0.0–7.0)
Eosinophils Absolute: 0.1 10*3/uL (ref 0.0–0.5)
HEMATOCRIT: 36.9 % — AB (ref 38.7–49.9)
HEMOGLOBIN: 12.1 g/dL — AB (ref 13.0–17.1)
LYMPH#: 2.9 10*3/uL (ref 0.9–3.3)
LYMPH%: 22.1 % (ref 14.0–48.0)
MCH: 26.1 pg — AB (ref 28.0–33.4)
MCHC: 32.8 g/dL (ref 32.0–35.9)
MCV: 80 fL — ABNORMAL LOW (ref 82–98)
MONO#: 1.3 10*3/uL — ABNORMAL HIGH (ref 0.1–0.9)
MONO%: 10.2 % (ref 0.0–13.0)
NEUT%: 67.1 % (ref 40.0–80.0)
NEUTROS ABS: 8.8 10*3/uL — AB (ref 1.5–6.5)
Platelets: 567 10*3/uL — ABNORMAL HIGH (ref 145–400)
RBC: 4.64 10*6/uL (ref 4.20–5.70)
RDW: 14 % (ref 11.1–15.7)
WBC: 13.1 10*3/uL — ABNORMAL HIGH (ref 4.0–10.0)

## 2016-12-22 LAB — CHCC SATELLITE - SMEAR

## 2016-12-22 NOTE — Progress Notes (Signed)
Referral MD  Reason for Referral: Bilateral mediastinal and hilar adenopathy and pulmonary nodules   No chief complaint on file. : I have an abnormal CT scan  HPI: Alexander Fry is a very nice 56 year old white male. He is originally from [place]. He has a incredibly intriguing job. 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 We talked a lot about this. He is lived in many different areas. He has been living in [place] for the past 3 years.  He has never smoked. He does not chew tobacco. He really does not have any alcohol use.   There may be some occupational exposures with asbestos.  He began to have a cough back in September. He was not coughing up any mucus or blood. He has not lost any weight recently. 3 years ago he weighed 300 pounds.  He's had no change in bowel or bladder habits. He's had no rashes. He's had no palpable lymph glands.  He ultimately had a CT scan done. This was done in early January. Surprisingly, this showed numerous pulmonary nodules. The largest was in the right lower lobe measuring 2.6 x 1.3 cm. 1 in the right lower lobe also measured 2.8 cm. He has a low-density lesion in the right hepatic lobe measuring 2.5 cm. He has extensive mediastinal and bilateral hilar adenopathy. He has a 3 cm subcarinal lymph node. Also noted is a right axillary lymph node measuring 3.1 cm.  He has had no sweats.  He had a PSA that was done 2 weeks ago that was normal.  He has never had a colonoscopy.  He's never had any skin lesions removed.  Again, there is no family history of malignancy.  He's had no leg swelling.  He's had no hoarseness. There's no dysphagia or odynophagia.  I must say, that he does look quite healthy. His overall performance status is ECOG 0.    Past Medical History:  Diagnosis Date  . Inguinal hernia of left side without obstruction or gangrene   :  Past Surgical History:  Procedure Laterality Date  . HERNIA REPAIR    . INGUINAL HERNIA REPAIR Left    :   Current Outpatient Prescriptions:  .  tamsulosin (FLOMAX) 0.4 MG CAPS capsule, Take 1 capsule (0.4 mg total) by mouth daily., Disp: 30 capsule, Rfl: 3 .  terbinafine (LAMISIL) 250 MG tablet, Take 250 mg by mouth daily., Disp: , Rfl: :  :  No Known Allergies:  Family History  Problem Relation Age of Onset  . Heart disease Mother   . Diabetes Mother   . Heart disease Father   . Alcohol abuse Brother   :  Social History   Social History  . Marital status: Single    Spouse name: N/A  . Number of children: N/A  . Years of education: N/A   Occupational History  . Not on file.   Social History Main Topics  . Smoking status: Never Smoker  . Smokeless tobacco: Never Used  . Alcohol use Yes     Comment: 0-1  . Drug use: No  . Sexual activity: Not Currently   Other Topics Concern  . Not on file   Social History Narrative  . No narrative on file  :  Pertinent items are noted in HPI.  Exam: @IPVITALS @ Well-developed and well-nourished white male in no obvious distress. Vital signs show temperature of 98.1. Pulse 94. Blood pressure 128/60. Weight is 20-30 pounds. And neck exam shows no ocular or oral lesions. He has  no palpable cervical or supraclavicular lymph nodes. Lungs are clear bilaterally. Cardiac exam regular rate and rhythm with no murmurs, rubs or bruits. Abdomen is soft. He has good bowel sounds. There is no fluid wave. There is no guarding or rebound tenderness. There is no palpable liver or spleen tip. Axillary exam shows no bilateral axillary adenopathy. Back exam shows no tenderness over the spine, ribs or hips. Extremities shows no clubbing, cyanosis or edema. Neurological exam shows no focal neurological deficits. Skin exam shows no rashes, ecchymoses or petechia.   Recent Labs  12/22/16 1203  WBC 13.1*  HGB 12.1*  HCT 36.9*  PLT 567*    Recent Labs  12/22/16 1203  NA 140  K 4.0  CL 101  CO2 27  GLUCOSE 94  BUN 19  CREATININE 0.8   CALCIUM 9.8    Blood smear review:  None   Pathology: None     Assessment and Plan:  Alexander Fry is a very nice 56 year old white male. He has mediastinal and hilar adenopathy. He has bilateral pulmonary nodules. He has a liver lesion. There is right axillary adenopathy.   I am struck by the fact that he is slightly anemic with a low MCV. His [place] count is high. I have to suspect iron deficiency. As such, I would suspect that he may have a colorectal primary which might be the source of his CAT scan abnormalities. He says that he is going to have a colonoscopy at the end of January.  We definitely need a tissue biopsy. A PET scan can help [name] on this. I think if the right axillary lymph node is active, I would favor that as a biopsy site. This should be the least invasive way of making a diagnosis.   I told he and his son that we'll we are looking at is highly likely to be treatable but not curable. I think that the only curable malignancies would be lymphoma, Hodgkin's disease, testicular cancer. These would be highly on likely.   I started to talk to him about goals of care. I told him that we would focus on quality of life and that our goal of treatment would be to decrease his tumor burden so that he would be able to function and still work. He obviously likes to work. He travels. Again I find it fascinating as to what job he has.  Both he and his son realize understand that in all likelihood we are dealing with is a malignancy. I cannot imagine this being non-malignant.   Maybe, we will get "lucky" and have this be a colorectal primary that we can treat with good success.   I showed them the CAT scans. I went over the CAT scan findings. I answered all their questions. I spent about an hour and a half with he and his son.  We will try to get our PET scan done quickly. This will then dictate what the next step will be.

## 2016-12-23 ENCOUNTER — Encounter: Payer: Self-pay | Admitting: Hematology & Oncology

## 2016-12-24 ENCOUNTER — Other Ambulatory Visit: Payer: Self-pay | Admitting: Physician Assistant

## 2016-12-24 ENCOUNTER — Other Ambulatory Visit: Payer: Self-pay

## 2016-12-24 DIAGNOSIS — M5441 Lumbago with sciatica, right side: Secondary | ICD-10-CM | POA: Insufficient documentation

## 2016-12-24 DIAGNOSIS — Z9189 Other specified personal risk factors, not elsewhere classified: Secondary | ICD-10-CM

## 2016-12-24 LAB — CEA (IN HOUSE-CHCC): CEA (CHCC-In House): 1 ng/mL (ref 0.00–5.00)

## 2016-12-24 LAB — BETA HCG QUANT (REF LAB)

## 2016-12-24 LAB — LACTATE DEHYDROGENASE: LDH: 196 U/L (ref 125–245)

## 2016-12-24 LAB — BETA 2 MICROGLOBULIN, SERUM: BETA 2: 2.5 mg/L — AB (ref 0.6–2.4)

## 2016-12-24 LAB — AFP TUMOR MARKER: AFP, SERUM, TUMOR MARKER: 2.2 ng/mL (ref 0.0–8.3)

## 2016-12-24 LAB — PSA: PROSTATE SPECIFIC AG, SERUM: 1.2 ng/mL (ref 0.0–4.0)

## 2016-12-24 MED ORDER — MELOXICAM 15 MG PO TABS: 15.0000 mg | ORAL_TABLET | Freq: Every day | ORAL | 2 refills | Status: DC

## 2016-12-24 NOTE — Progress Notes (Signed)
Patient continues to have right-sided low-back pain with radiation down the right leg. Patient was seen for this in urgent care on 12/09/16 and prescribed Flexeril and Norco. Patient states medications did not help his pain. Sent a Prednisone 50mg  burst to his pharmacy on 12/21/16 and patient reported that he felt improved. Today he states that he overdid it working in the yard yesterday. Sent Meloxicam 15mg  to start when he completes Prednisone burst. Ice or heat x 15 minutes every 2 hours. Rest. Follow-up with Sports Medicine.  Patient requests a TB test for travel to [place] in the last year. Ordered Quantiferon gold.

## 2016-12-25 LAB — QUANTIFERON TB GOLD ASSAY (BLOOD)
INTERFERON GAMMA RELEASE ASSAY: NEGATIVE
Mitogen-Nil: 6.52 IU/mL
Quantiferon Nil Value: 0.03 IU/mL
Quantiferon Tb Ag Minus Nil Value: 0.01 IU/mL

## 2016-12-27 ENCOUNTER — Encounter: Payer: Self-pay | Admitting: Hematology & Oncology

## 2016-12-31 ENCOUNTER — Telehealth: Payer: Self-pay | Admitting: *Deleted

## 2016-12-31 NOTE — Telephone Encounter (Signed)
Was able to move PET scan up from Feb 2nd to January 29th at [facility] with patient and notified him of time change. Also instructed him to not eat or drink (NPO) after midnight the night before. He understood.  << Less Detail',event)" href="javascript:;">More Detail >>    RE: Visit Follow-Up Question  Alexander Fry  Sent: Thu December 30, 2016 11:02 AM  To: Nobie Putnam Nurse Hp            Message   Nurse Roselyn Reef,    Feb 2 is the first available open appointment. I would like it to be earlier as well.    The other question I have is with all the blood work? What is the results in English for me ? Were these good or is there some concerns?    Alexander Fry  ----- Message -----  From: Nurse Ewing Schlein  Sent: 12/27/2016 3:09 PM EST  To: [name]  Subject: RE: Visit Follow-Up Question  Alexander Fry,    Sorry for the confusion. It has only recently changed to where patient's are now scheduling their own radiology exams. Dr Marin Olp may have said we would do it for you out of habit. I apologize that the scheduling was unclear.     Was the earliest available February 2nd? It would be great if this could be sooner, but maybe your schedule had a conflict?    Roselyn Reef RN      ----- Message -----   From: [name]   Sent: 12/27/2016 2:48 PM EST    To: [name], MD  Subject: RE: Visit Follow-Up Question    Nurse Roselyn Reef    Thank you for the response. This is different than what was explained to me on Wednesday last week. I understand now, But I was still under the assumption that these were being scheduled for me. I found out today that I had to call and schedule the Pet Scan which will be on Feb 2.    Alexander Fry

## 2017-01-03 ENCOUNTER — Encounter (HOSPITAL_COMMUNITY)
Admission: RE | Admit: 2017-01-03 | Discharge: 2017-01-03 | Disposition: A | Discharge status: Home / Self Care | Payer: BLUE CROSS/BLUE SHIELD | Source: Ambulatory Visit | Attending: Hematology & Oncology | Admitting: Hematology & Oncology

## 2017-01-03 DIAGNOSIS — R918 Other nonspecific abnormal finding of lung field: Secondary | ICD-10-CM | POA: Diagnosis present

## 2017-01-03 DIAGNOSIS — R590 Localized enlarged lymph nodes: Secondary | ICD-10-CM | POA: Diagnosis present

## 2017-01-03 LAB — GLUCOSE, CAPILLARY: GLUCOSE-CAPILLARY: 127 mg/dL — AB (ref 65–99)

## 2017-01-03 MED ORDER — FLUDEOXYGLUCOSE F - 18 (FDG) INJECTION
11.5000 | Freq: Once | INTRAVENOUS | Status: AC | PRN
  Administered 2017-01-03: 11.5 via INTRAVENOUS

## 2017-01-04 ENCOUNTER — Encounter: Payer: Self-pay | Admitting: Hematology & Oncology

## 2017-01-05 ENCOUNTER — Encounter: Payer: Self-pay | Admitting: Physician Assistant

## 2017-01-05 ENCOUNTER — Other Ambulatory Visit: Payer: Self-pay | Admitting: Hematology & Oncology

## 2017-01-05 ENCOUNTER — Encounter: Payer: Self-pay | Admitting: Hematology & Oncology

## 2017-01-05 DIAGNOSIS — N2889 Other specified disorders of kidney and ureter: Secondary | ICD-10-CM

## 2017-01-05 DIAGNOSIS — C641 Malignant neoplasm of right kidney, except renal pelvis: Secondary | ICD-10-CM

## 2017-01-05 HISTORY — DX: Malignant neoplasm of right kidney, except renal pelvis: C64.1

## 2017-01-06 ENCOUNTER — Other Ambulatory Visit: Payer: Self-pay | Admitting: Physician Assistant

## 2017-01-07 ENCOUNTER — Encounter (HOSPITAL_COMMUNITY): Payer: BLUE CROSS/BLUE SHIELD

## 2017-01-07 ENCOUNTER — Encounter: Payer: Self-pay | Admitting: Physician Assistant

## 2017-01-07 ENCOUNTER — Ambulatory Visit (HOSPITAL_COMMUNITY)
Admission: RE | Admit: 2017-01-07 | Discharge: 2017-01-07 | Disposition: A | Discharge status: Home / Self Care | Payer: BLUE CROSS/BLUE SHIELD | Source: Ambulatory Visit | Attending: Hematology & Oncology | Admitting: Hematology & Oncology

## 2017-01-07 ENCOUNTER — Other Ambulatory Visit: Payer: Self-pay | Admitting: Family

## 2017-01-07 ENCOUNTER — Encounter (HOSPITAL_COMMUNITY): Payer: Self-pay

## 2017-01-07 DIAGNOSIS — Z833 Family history of diabetes mellitus: Secondary | ICD-10-CM | POA: Diagnosis not present

## 2017-01-07 DIAGNOSIS — C7951 Secondary malignant neoplasm of bone: Secondary | ICD-10-CM | POA: Insufficient documentation

## 2017-01-07 DIAGNOSIS — C799 Secondary malignant neoplasm of unspecified site: Secondary | ICD-10-CM | POA: Insufficient documentation

## 2017-01-07 DIAGNOSIS — C641 Malignant neoplasm of right kidney, except renal pelvis: Secondary | ICD-10-CM | POA: Diagnosis present

## 2017-01-07 DIAGNOSIS — R52 Pain, unspecified: Secondary | ICD-10-CM

## 2017-01-07 DIAGNOSIS — Z811 Family history of alcohol abuse and dependence: Secondary | ICD-10-CM | POA: Diagnosis not present

## 2017-01-07 DIAGNOSIS — Z8249 Family history of ischemic heart disease and other diseases of the circulatory system: Secondary | ICD-10-CM | POA: Diagnosis not present

## 2017-01-07 DIAGNOSIS — N2889 Other specified disorders of kidney and ureter: Secondary | ICD-10-CM | POA: Diagnosis present

## 2017-01-07 DIAGNOSIS — R590 Localized enlarged lymph nodes: Secondary | ICD-10-CM | POA: Diagnosis not present

## 2017-01-07 DIAGNOSIS — K648 Other hemorrhoids: Secondary | ICD-10-CM | POA: Insufficient documentation

## 2017-01-07 LAB — CBC
HCT: 34.8 % — ABNORMAL LOW (ref 39.0–52.0)
Hemoglobin: 11 g/dL — ABNORMAL LOW (ref 13.0–17.0)
MCH: 25.1 pg — ABNORMAL LOW (ref 26.0–34.0)
MCHC: 31.6 g/dL (ref 30.0–36.0)
MCV: 79.5 fL (ref 78.0–100.0)
Platelets: 385 K/uL (ref 150–400)
RBC: 4.38 MIL/uL (ref 4.22–5.81)
RDW: 14.1 % (ref 11.5–15.5)
WBC: 9.6 K/uL (ref 4.0–10.5)

## 2017-01-07 LAB — APTT: aPTT: 41 s — ABNORMAL HIGH (ref 24–36)

## 2017-01-07 LAB — PROTIME-INR
INR: 1.16
PROTHROMBIN TIME: 14.8 s (ref 11.4–15.2)

## 2017-01-07 MED ORDER — MIDAZOLAM HCL 2 MG/2ML IJ SOLN
INTRAMUSCULAR | Status: AC
  Filled 2017-01-07: qty 2

## 2017-01-07 MED ORDER — MIDAZOLAM HCL 2 MG/2ML IJ SOLN
INTRAMUSCULAR | Status: AC
  Filled 2017-01-07: qty 4

## 2017-01-07 MED ORDER — SODIUM CHLORIDE 0.9 % IV SOLN
INTRAVENOUS | Status: DC
  Administered 2017-01-07: 10 mL/h via INTRAVENOUS

## 2017-01-07 MED ORDER — FENTANYL CITRATE (PF) 100 MCG/2ML IJ SOLN
INTRAMUSCULAR | Status: AC
  Filled 2017-01-07: qty 4

## 2017-01-07 MED ORDER — MIDAZOLAM HCL 2 MG/2ML IJ SOLN
INTRAMUSCULAR | Status: AC | PRN
  Administered 2017-01-07: 0.5 mg via INTRAVENOUS
  Administered 2017-01-07: 1 mg via INTRAVENOUS
  Administered 2017-01-07: 0.5 mg via INTRAVENOUS

## 2017-01-07 MED ORDER — FENTANYL CITRATE (PF) 100 MCG/2ML IJ SOLN
INTRAMUSCULAR | Status: AC | PRN
  Administered 2017-01-07: 50 ug via INTRAVENOUS
  Administered 2017-01-07 (×2): 25 ug via INTRAVENOUS

## 2017-01-07 MED ORDER — LIDOCAINE-EPINEPHRINE (PF) 1.5 %-1:200000 IJ SOLN
30.0000 mL | Freq: Once | INTRAMUSCULAR | Status: DC
  Filled 2017-01-07: qty 30

## 2017-01-07 MED ORDER — OXYCODONE-ACETAMINOPHEN 5-325 MG PO TABS: 1.0000 | ORAL_TABLET | Freq: Four times a day (QID) | ORAL | 0 refills | Status: DC | PRN

## 2017-01-07 MED ORDER — GELATIN ABSORBABLE 12-7 MM EX MISC
CUTANEOUS | Status: AC
Start: 2017-01-07 — End: 2017-01-08
  Filled 2017-01-07: qty 1

## 2017-01-07 MED FILL — OXYCODONE W/APAP 5/325 TAB: 5-325 | 7 days supply | Qty: 60 | Fill #0

## 2017-01-07 NOTE — Sedation Documentation (Signed)
Patient is resting comfortably. 

## 2017-01-07 NOTE — H&P (Signed)
Chief Complaint: Patient was seen in consultation today for paraspinal mass biopsy at the request of Ennever,Peter R  Referring Physician(s): Ennever,Peter R  Supervising Physician: Sandi Mariscal  Patient Status: [facility] - Out-pt  History of Present Illness: Alexander Fry is a 56 y.o. male   Cough since October Worsening back pain for months Now has severe Rt leg pain--unable to sleep; Tylenol no real relief  PMD ordered CXR--abnormal CT followed 12/10/16: IMPRESSION: Extensive mediastinal and bilateral hilar adenopathy. Numerous bilateral pulmonary nodules. Possible pleural mass in the posterior left lower chest. Low-density lesion within the right hepatic lobe and lytic destructive process in the superior L1 vertebral body. These findings are concerning for metastatic disease of unknown primary.  Referred to Dr Marin Olp PET 1/29: IMPRESSION: 0000000 cm hypermetabolic mass involving middle and lower pole of right kidney, consistent with primary renal cell carcinoma. Metastatic lymphadenopathy in aortocaval space, and throughout the mediastinum and bilateral hilar regions. Diffuse bilateral pulmonary metastases. Liver metastasis in the posterior right hepatic lobe. 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  Now scheduled for right paraspinal mass biopsy per Dr Marin Olp Need tissue diagnosis for treatment   Past Medical History:  Diagnosis Date  . Goals of care, counseling/discussion 12/22/2016  . Inguinal hernia of left side without obstruction or gangrene   . 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 HERNIA REPAIR    . INGUINAL HERNIA REPAIR Left     Allergies: Patient has no known allergies.  Medications: Prior to Admission medications   Medication Sig Start Date End Date Taking? Authorizing Provider  acetaminophen (TYLENOL) 500 MG tablet Take 1,000 mg by mouth every 6 (six) hours as needed  for mild pain or moderate pain.   Yes Historical Provider, MD  tamsulosin (FLOMAX) 0.4 MG CAPS capsule Take 1 capsule (0.4 mg total) by mouth daily. 12/10/16  Yes [name], PA-C  terbinafine (LAMISIL) 250 MG tablet Take 250 mg by mouth daily.   Yes Historical Provider, MD  meloxicam (MOBIC) 15 MG tablet Take 1 tablet (15 mg total) by mouth daily. Patient not taking: Reported on 01/06/2017 12/24/16   [name], PA-C     Family History  Problem Relation Age of Onset  . Heart disease Mother   . Diabetes Mother   . Heart disease Father   . Alcohol abuse Brother     Social History   Social History  . Marital status: Single    Spouse name: N/A  . Number of children: N/A  . Years of education: N/A   Social History Main Topics  . Smoking status: Never Smoker  . Smokeless tobacco: Never Used  . Alcohol use Yes     Comment: 0-1  . Drug use: No  . Sexual activity: Not Currently   Other Topics Concern  . None   Social History Narrative  . None     Review of Systems: A 12 point ROS discussed and pertinent positives are indicated in the HPI above.  All other systems are negative.  Review of Systems  Constitutional: Positive for activity change and unexpected weight change. Negative for appetite change.  Respiratory: Positive for cough. Negative for shortness of breath.   Gastrointestinal: Negative for abdominal pain.  Musculoskeletal: Positive for back pain.  Neurological: Negative for dizziness and weakness.  Psychiatric/Behavioral: Negative for behavioral problems and confusion.    Vital Signs: BP 131/71   Pulse 91  Temp 98.6 F (37 C)   Resp 18   Ht 6' (1.829 m)   Wt 219 lb (99.3 kg)   SpO2 95%   BMI 29.70 kg/m   Physical Exam  Constitutional: He is oriented to person, place, and time. He appears well-nourished.  Cardiovascular: Normal rate, regular rhythm and normal heart sounds.   Pulmonary/Chest: Effort normal and breath sounds  normal.  Abdominal: Soft. Bowel sounds are normal.  Musculoskeletal: Normal range of motion. He exhibits no edema.  Neurological: He is alert and oriented to person, place, and time.  Skin: Skin is warm and dry.  Psychiatric: He has a normal mood and affect. His behavior is normal. Judgment and thought content normal.  Nursing note and vitals reviewed.   Mallampati Score:  MD Evaluation Airway: WNL Heart: WNL Abdomen: WNL Chest/ Lungs: WNL ASA  Classification: 3 Mallampati/Airway Score: One  Imaging: Dg Chest 2 View  Result Date: 12/09/2016 CLINICAL DATA:  Cough for past 3 months EXAM: CHEST  2 VIEW COMPARISON:  None in PACs FINDINGS: The lungs are adequately inflated. There is hilar prominence predominantly on the right but to a lesser extent on the left. The perihilar lung markings are increased as well. The cardiac silhouette is enlarged. The trachea is midline. There is no pleural effusion. There is soft tissue fullness along the left lower lateral ribcage that is of uncertain etiology. There is faint calcification in the wall of the aortic arch. IMPRESSION: Bilateral hilar prominence with perihilar interstitial prominence consistent with lymphadenopathy with mild interstitial infiltrate or noncardiac edema. Given the patient's chronic persistent cough, contrast-enhanced chest CT scanning is recommended to exclude occult malignancy and to further evaluate the mediastinum, hila, and pulmonary interstitium. Electronically Signed   By: David  Martinique M.D.   On: 12/09/2016 10:08   Ct Chest W Contrast  Result Date: 12/10/2016 CLINICAL DATA:  Cough for past 3 months.  Abnormal chest x-ray EXAM: CT CHEST WITH CONTRAST TECHNIQUE: Multidetector CT imaging of the chest was performed during intravenous contrast administration. CONTRAST:  57mL ISOVUE-300 IOPAMIDOL (ISOVUE-300) INJECTION 61% COMPARISON:  12/09/2016 FINDINGS: Cardiovascular: Heart is normal size. Aorta is normal caliber.  Mediastinum/Nodes: Extensive mediastinal and bilateral hilar adenopathy. Subcarinal lymph node has a short axis diameter of 3 cm. Right axillary lymph node has a short axis diameter of 3.1 cm. Left hilar lymph node has a short axis diameter of 2 cm. Posterior mediastinal adenopathy adjacent to the mid esophagus. No axillary adenopathy. Lungs/Pleura: There are numerous bilateral pulmonary nodules concerning for metastatic disease. The largest nodule on the right is in the right lower lobe where there is an elongated nodule measuring 2.6 x 1.3 cm on image 80. Largest nodule on the left is in the superior segment of the left lower lobe posteriorly measuring up to 1.5 cm. There is also a pleural or subpleural masslike area noted posteriorly in the right left lower hemithorax measuring up to 2.8 cm on image 44. No pleural effusions. Upper Abdomen: Low-density lesion in the right hepatic lobe measures 2.5 cm on image 53. Gallstones in the gallbladder. Musculoskeletal: No chest wall abnormality. There is a lucent lesion partially imaged on the lowest image (number 67 within the L1 vertebral body measuring 3.3 cm. This appears expansile and destructive along the anterior right superior L1 vertebral body. IMPRESSION: Extensive mediastinal and bilateral hilar adenopathy. Numerous bilateral pulmonary nodules. Possible pleural mass in the posterior left lower chest. Low-density lesion within the right hepatic lobe and lytic destructive process in the  superior L1 vertebral body. These findings are concerning for metastatic disease of unknown primary. Cholelithiasis. Electronically Signed   By: Rolm Baptise M.D.   On: 12/10/2016 11:08   Nm Pet Image Initial (pi) Skull Base To Thigh  Result Date: 01/03/2017 CLINICAL DATA:  Initial treatment strategy for pulmonary nodules and thoracic lymphadenopathy. EXAM: NUCLEAR MEDICINE PET SKULL BASE TO THIGH TECHNIQUE: 11.5 cm mCi F-18 FDG was injected intravenously. Full-ring PET imaging  was performed from the skull base to thigh after the radiotracer. CT data was obtained and used for attenuation correction and anatomic localization. FASTING BLOOD GLUCOSE:  Value: 127 mg/dl COMPARISON:  Chest CT on 12/10/2016 FINDINGS: NECK No hypermetabolic lymph nodes or other masses identified. CHEST Hypermetabolic mediastinal and bilateral hilar lymphadenopathy is seen. Index lymph node in the subcarinal region measures 2.5 cm short axis on image 75/4, with SUV max of 14.5. Numerous hypermetabolic bilateral pulmonary nodules are seen, consistent with pulmonary metastases. Index subpleural nodule posterior left lower lobe measures 2.5 x 1 cm image 94/4, and has SUV max of 15.2. This may represent a pulmonary or pleural metastasis. No evidence of pleural effusion. ABDOMEN/PELVIS 3.1 cm low-attenuation in segment 7 of the right hepatic lobe measures 3.1 cm on image 102/4, and has SUV max of 7.0. No other definite liver masses are identified on this unenhanced exam. A large hypermetabolic soft tissue mass is seen involving the mid and lower pole of the right kidney, measuring 11.5 x 8.3 cm on image 133/4 . This has SUV max of 161.7, and is consistent with primary renal cell carcinoma. This mass shows involvement of Gerota's fascia. 1.5 cm aorto-caval lymph node is seen on image 141/4, which is hypermetabolic with SUV max of 123XX123. No other hypermetabolic lymph nodes within the abdomen or pelvis. Hypermetabolic activity is seen in both adrenal glands, without evidence of mass on CT. Cholelithiasis is noted, without evidence of cholecystitis. SKELETON Hypermetabolic lytic bone metastases are seen involving L1 and L4 vertebral bodies, with associated right paraspinal soft tissue extension these levels . Index lesion at L4 has SUV max of 7.7. A small lytic hypermetabolic bone metastasis is also seen right ilium. IMPRESSION: 0000000 cm hypermetabolic mass involving middle and lower pole of right kidney, consistent with  primary renal cell carcinoma. Metastatic lymphadenopathy in aortocaval space, and throughout the mediastinum and bilateral hilar regions. Diffuse bilateral pulmonary metastases. Liver metastasis in the posterior right hepatic lobe. 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 Electronically Signed   By: Earle Gell M.D.   On: 01/03/2017 09:34    Labs:  CBC:  Recent Labs  12/10/16 1049 12/22/16 1203  WBC 9.3 13.1*  HGB 11.5* 12.1*  HCT 35.2* 36.9*  PLT 418* 567*    COAGS: No results for input(s): INR, APTT in the last 8760 hours.  BMP:  Recent Labs  12/10/16 1049 12/22/16 1203  NA 138 140  K 4.1 4.0  CL 99 101  CO2 26 27  GLUCOSE 80 94  BUN 16 19  CALCIUM 9.0 9.8  CREATININE 0.64* 0.8    LIVER FUNCTION TESTS:  Recent Labs  12/10/16 1049 12/22/16 1203  BILITOT 0.6 0.50  AST 17 24  ALT 15 25  ALKPHOS 103 134*  PROT 6.9 8.6*  ALBUMIN 3.4* 3.3    TUMOR MARKERS: No results for input(s): AFPTM, CEA, CA199, CHROMGRNA in the last 8760 hours.  Assessment and Plan:  Cough and worsening back pain for months Work up revealed: + hilar ;  mediastinal Lymphadenopathy Liver mass; bony lesions Rt renal mass Scheduled for Rt paraspinal mass biopsy Risks and Benefits discussed with the patient including, but not limited to bleeding, infection, damage to adjacent structures or low yield requiring additional tests. All of the patient's questions were answered, patient is agreeable to proceed. Consent signed and in chart.  Thank you for this interesting consult.  I greatly enjoyed meeting Alexander Fry and look forward to participating in their care.  A copy of this report was sent to the requesting provider on this date.  Electronically Signed: Monia Sabal A 01/07/2017, 10:09 AM   I spent a total of  30 Minutes   in face to face in clinical consultation, greater than 50% of which was counseling/coordinating care for right paraspinal mass  biopsy

## 2017-01-07 NOTE — Progress Notes (Signed)
Pt discharged but waiting for transportation from nephew.

## 2017-01-07 NOTE — Procedures (Signed)
Technically successful CT guided biopsy of lytic mass involving the right side of the L4 vertebral body. EBL: Minimal.  No immediate post procedural complications.   Ronny Bacon, MD Pager #: [phone]

## 2017-01-07 NOTE — Discharge Instructions (Signed)
Needle Biopsy, Care After °Introduction °These instructions give you information about caring for yourself after your procedure. Your doctor may also give you more specific instructions. Call your doctor if you have any problems or questions after your procedure. °Follow these instructions at home: °· Rest as told by your doctor. °· Take medicines only as told by your doctor. °· There are many different ways to close and cover the biopsy site, including stitches (sutures), skin glue, and adhesive strips. Follow instructions from your doctor about: °¨ How to take care of your biopsy site. °¨ When and how you should change your bandage (dressing). °¨ When you should remove your dressing. °¨ Removing whatever was used to close your biopsy site. °· Check your biopsy site every day for signs of infection. Watch for: °¨ Redness, swelling, or pain. °¨ Fluid, blood, or pus. °Contact a doctor if: °· You have a fever. °· You have redness, swelling, or pain at the biopsy site, and it lasts longer than a few days. °· You have fluid, blood, or pus coming from the biopsy site. °· You feel sick to your stomach (nauseous). °· You throw up (vomit). °Get help right away if: °· You are short of breath. °· You have trouble breathing. °· Your chest hurts. °· You feel dizzy or you pass out (faint). °· You have bleeding that does not stop with pressure or a bandage. °· You cough up blood. °· Your belly (abdomen) hurts. °This information is not intended to replace advice given to you by your health care provider. Make sure you discuss any questions you have with your health care provider. °Document Released: 11/04/2008 Document Revised: 04/29/2016 Document Reviewed: 11/18/2014 °© 2017 Elsevier ° °

## 2017-01-11 ENCOUNTER — Encounter: Payer: Self-pay | Admitting: Hematology & Oncology

## 2017-01-11 ENCOUNTER — Other Ambulatory Visit: Payer: Self-pay | Admitting: Hematology & Oncology

## 2017-01-11 DIAGNOSIS — C641 Malignant neoplasm of right kidney, except renal pelvis: Secondary | ICD-10-CM

## 2017-01-11 MED ORDER — PAZOPANIB HCL 200 MG PO TABS: 800.0000 mg | ORAL_TABLET | Freq: Every day | ORAL | 2 refills | Status: DC

## 2017-01-13 ENCOUNTER — Ambulatory Visit (HOSPITAL_BASED_OUTPATIENT_CLINIC_OR_DEPARTMENT_OTHER): Payer: BLUE CROSS/BLUE SHIELD

## 2017-01-13 ENCOUNTER — Other Ambulatory Visit (HOSPITAL_BASED_OUTPATIENT_CLINIC_OR_DEPARTMENT_OTHER): Payer: BLUE CROSS/BLUE SHIELD

## 2017-01-13 ENCOUNTER — Ambulatory Visit (HOSPITAL_BASED_OUTPATIENT_CLINIC_OR_DEPARTMENT_OTHER): Payer: BLUE CROSS/BLUE SHIELD | Admitting: Hematology & Oncology

## 2017-01-13 ENCOUNTER — Other Ambulatory Visit: Payer: Self-pay | Admitting: Family

## 2017-01-13 VITALS — BP 132/59 | HR 84 | Temp 96.8°F | Resp 18 | Wt 225.5 lb

## 2017-01-13 DIAGNOSIS — C641 Malignant neoplasm of right kidney, except renal pelvis: Secondary | ICD-10-CM | POA: Diagnosis not present

## 2017-01-13 DIAGNOSIS — C7951 Secondary malignant neoplasm of bone: Secondary | ICD-10-CM

## 2017-01-13 DIAGNOSIS — C787 Secondary malignant neoplasm of liver and intrahepatic bile duct: Secondary | ICD-10-CM

## 2017-01-13 LAB — CMP (CANCER CENTER ONLY)
ALBUMIN: 3.1 g/dL — AB (ref 3.3–5.5)
ALK PHOS: 176 U/L — AB (ref 26–84)
ALT(SGPT): 23 U/L (ref 10–47)
AST: 20 U/L (ref 11–38)
BUN, Bld: 12 mg/dL (ref 7–22)
CHLORIDE: 95 meq/L — AB (ref 98–108)
CO2: 30 mEq/L (ref 18–33)
CREATININE: 0.7 mg/dL (ref 0.6–1.2)
Calcium: 9.9 mg/dL (ref 8.0–10.3)
Glucose, Bld: 91 mg/dL (ref 73–118)
Potassium: 4.3 mEq/L (ref 3.3–4.7)
SODIUM: 141 meq/L (ref 128–145)
TOTAL PROTEIN: 8.6 g/dL — AB (ref 6.4–8.1)
Total Bilirubin: 0.7 mg/dl (ref 0.20–1.60)

## 2017-01-13 LAB — CBC WITH DIFFERENTIAL (CANCER CENTER ONLY)
BASO#: 0 10*3/uL (ref 0.0–0.2)
BASO%: 0.3 % (ref 0.0–2.0)
EOS ABS: 0.1 10*3/uL (ref 0.0–0.5)
EOS%: 1.2 % (ref 0.0–7.0)
HCT: 38.4 % — ABNORMAL LOW (ref 38.7–49.9)
HEMOGLOBIN: 12.2 g/dL — AB (ref 13.0–17.1)
LYMPH#: 2.5 10*3/uL (ref 0.9–3.3)
LYMPH%: 28.4 % (ref 14.0–48.0)
MCH: 25.6 pg — AB (ref 28.0–33.4)
MCHC: 31.8 g/dL — ABNORMAL LOW (ref 32.0–35.9)
MCV: 81 fL — AB (ref 82–98)
MONO#: 1 10*3/uL — AB (ref 0.1–0.9)
MONO%: 11.5 % (ref 0.0–13.0)
NEUT%: 58.6 % (ref 40.0–80.0)
NEUTROS ABS: 5.1 10*3/uL (ref 1.5–6.5)
Platelets: 455 10*3/uL — ABNORMAL HIGH (ref 145–400)
RBC: 4.76 10*6/uL (ref 4.20–5.70)
RDW: 14.2 % (ref 11.1–15.7)
WBC: 8.7 10*3/uL (ref 4.0–10.0)

## 2017-01-13 LAB — LACTATE DEHYDROGENASE: LDH: 223 U/L (ref 125–245)

## 2017-01-13 MED ORDER — KETOROLAC TROMETHAMINE 15 MG/ML IJ SOLN
INTRAMUSCULAR | Status: AC
  Filled 2017-01-13: qty 2

## 2017-01-13 MED ORDER — HYDROMORPHONE HCL 1 MG/ML IJ SOLN
INTRAMUSCULAR | Status: AC
  Filled 2017-01-13: qty 2

## 2017-01-13 MED ORDER — PROCHLORPERAZINE MALEATE 10 MG PO TABS: 10.0000 mg | ORAL_TABLET | Freq: Four times a day (QID) | ORAL | 0 refills | Status: DC | PRN

## 2017-01-13 MED ORDER — HYDROMORPHONE HCL 4 MG PO TABS: 4.0000 mg | ORAL_TABLET | ORAL | 0 refills | Status: DC | PRN

## 2017-01-13 MED ORDER — FENTANYL 12 MCG/HR TD PT72: 25.0000 ug | MEDICATED_PATCH | TRANSDERMAL | 0 refills | Status: DC

## 2017-01-13 MED ORDER — ONDANSETRON HCL 8 MG PO TABS: 8.0000 mg | ORAL_TABLET | Freq: Three times a day (TID) | ORAL | 3 refills | Status: DC | PRN

## 2017-01-13 MED ORDER — KETOROLAC TROMETHAMINE 15 MG/ML IJ SOLN
30.0000 mg | Freq: Once | INTRAMUSCULAR | Status: AC
  Administered 2017-01-13: 30 mg via INTRAVENOUS

## 2017-01-13 MED ORDER — SODIUM CHLORIDE 0.9 % IV SOLN
INTRAVENOUS | Status: AC
  Administered 2017-01-13: 13:00:00 via INTRAVENOUS

## 2017-01-13 MED ORDER — HYDROMORPHONE HCL 1 MG/ML IJ SOLN
2.0000 mg | Freq: Once | INTRAMUSCULAR | Status: AC
  Administered 2017-01-13: 2 mg via INTRAVENOUS

## 2017-01-13 MED ORDER — DENOSUMAB 120 MG/1.7ML ~~LOC~~ SOLN
120.0000 mg | Freq: Once | SUBCUTANEOUS | Status: AC
  Administered 2017-01-13: 120 mg via SUBCUTANEOUS
  Filled 2017-01-13: qty 1.7

## 2017-01-13 MED FILL — ONDANSETRON HCL 8 MG TABLET: 8 | 13 days supply | Qty: 40 | Fill #0

## 2017-01-13 MED FILL — HYDROmorphone HCL 4 MG TABS: 4 | 15 days supply | Qty: 90 | Fill #0

## 2017-01-13 MED FILL — fentaNYL 12 MCG/HR PT72: 12 | 15 days supply | Qty: 10 | Fill #0

## 2017-01-13 MED FILL — PROCHLORPERAZINE 10 MG TAB: 10 | 15 days supply | Qty: 60 | Fill #0

## 2017-01-13 NOTE — Patient Instructions (Signed)
Denosumab injection What is this medicine? DENOSUMAB (den oh sue mab) slows bone breakdown. Prolia is used to treat osteoporosis in women after menopause and in men. Xgeva is used to prevent bone fractures and other bone problems caused by cancer bone metastases. Xgeva is also used to treat giant cell tumor of the bone. COMMON BRAND NAME(S): Prolia, XGEVA What should I tell my health care provider before I take this medicine? They need to know if you have any of these conditions: -dental disease -eczema -infection or history of infections -kidney disease or on dialysis -low blood calcium or vitamin D -malabsorption syndrome -scheduled to have surgery or tooth extraction -taking medicine that contains denosumab -thyroid or parathyroid disease -an unusual reaction to denosumab, other medicines, foods, dyes, or preservatives -pregnant or trying to get pregnant -breast-feeding How should I use this medicine? This medicine is for injection under the skin. It is given by a health care professional in a hospital or clinic setting. If you are getting Prolia, a special MedGuide will be given to you by the pharmacist with each prescription and refill. Be sure to read this information carefully each time. For Prolia, talk to your pediatrician regarding the use of this medicine in children. Special care may be needed. For Xgeva, talk to your pediatrician regarding the use of this medicine in children. While this drug may be prescribed for children as young as 13 years for selected conditions, precautions do apply. What if I miss a dose? It is important not to miss your dose. Call your doctor or health care professional if you are unable to keep an appointment. What may interact with this medicine? Do not take this medicine with any of the following medications: -other medicines containing denosumab This medicine may also interact with the following medications: -medicines that suppress the immune  system -medicines that treat cancer -steroid medicines like prednisone or cortisone What should I watch for while using this medicine? Visit your doctor or health care professional for regular checks on your progress. Your doctor or health care professional may order blood tests and other tests to see how you are doing. Call your doctor or health care professional if you get a cold or other infection while receiving this medicine. Do not treat yourself. This medicine may decrease your body's ability to fight infection. You should make sure you get enough calcium and vitamin D while you are taking this medicine, unless your doctor tells you not to. Discuss the foods you eat and the vitamins you take with your health care professional. See your dentist regularly. Brush and floss your teeth as directed. Before you have any dental work done, tell your dentist you are receiving this medicine. Do not become pregnant while taking this medicine or for 5 months after stopping it. Women should inform their doctor if they wish to become pregnant or think they might be pregnant. There is a potential for serious side effects to an unborn child. Talk to your health care professional or pharmacist for more information. What side effects may I notice from receiving this medicine? Side effects that you should report to your doctor or health care professional as soon as possible: -allergic reactions like skin rash, itching or hives, swelling of the face, lips, or tongue -breathing problems -chest pain -fast, irregular heartbeat -feeling faint or lightheaded, falls -fever, chills, or any other sign of infection -muscle spasms, tightening, or twitches -numbness or tingling -skin blisters or bumps, or is dry, peels, or red -slow   healing or unexplained pain in the mouth or jaw -unusual bleeding or bruising Side effects that usually do not require medical attention (report to your doctor or health care professional  if they continue or are bothersome): -muscle pain -stomach upset, gas Where should I keep my medicine? This medicine is only given in a clinic, doctor's office, or other health care setting and will not be stored at home.  2017 Elsevier/Gold Standard (2015-12-25 10:06:55)  

## 2017-01-13 NOTE — Progress Notes (Signed)
Hematology and Oncology Follow Up Visit  Alexander Fry [id number] 10/30/1961 56 y.o. 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 This is a second office visit. I first saw him on January 17. At that point, he presented with pulmonary nodules.   We did routine staging studies. Shockingly enough, he was found to have a large right renal mass. This is where the primary is. He had a liver metastasis. He has lymphadenopathy. He has bone lesions.  We did get a biopsy. This was a bone biopsy of the L4 vertebral body. The pathology report [id number]) revealed metastatic renal cell carcinoma. This was clear cell histology.  He has not yet had his brain MRI.  The PET scan that he had done showed marked activity in multiple areas.  His renal lesion measures 11.5 cm. It starts in the lower pole of the right kidney.  I felt that because of the extent of his metastatic disease, that he probably would not benefit from upfront nephrectomy.  He's having some discomfort in the right hip. There is no weakness in the right leg. He's having some constipation and pain medications.  He has a poor appetite. Some of the poor appetite because he is hurting.  He's had no cough. His been no bleeding. Has been no headache. He's had no visual difficulties.  He appears to be a little bit dehydrated. I say that overall, his performance status is ECOG 1.  Medications:  Current Outpatient Prescriptions:  .  fentaNYL (DURAGESIC - DOSED MCG/HR) 12 MCG/HR, Place 2 patches (25 mcg total) onto the skin every 3 (three) days., Disp: 10 patch, Rfl: 0 .  HYDROmorphone (DILAUDID) 4 MG tablet, Take 1 tablet (4 mg total) by mouth every 4 (four) hours as needed for severe pain., Disp: 90  tablet, Rfl: 0 .  ondansetron (ZOFRAN) 8 MG tablet, Take 1 tablet (8 mg total) by mouth every 8 (eight) hours as needed for nausea or vomiting., Disp: 40 tablet, Rfl: 3 .  pazopanib (VOTRIENT) 200 MG tablet, Take 4 tablets (800 mg total) by mouth daily. Take on an empty stomach., Disp: 120 tablet, Rfl: 2 .  prochlorperazine (COMPAZINE) 10 MG tablet, Take 1 tablet (10 mg total) by mouth every 6 (six) hours as needed for nausea or vomiting., Disp: 60 tablet, Rfl: 0 .  tamsulosin (FLOMAX) 0.4 MG CAPS capsule, Take 1 capsule (0.4 mg total) by mouth daily., Disp: 30 capsule, Rfl: 3  Allergies: No Known Allergies  Past Medical History, Surgical history, Social history, and Family History were reviewed and updated.  Review of Systems:  As above  Physical Exam:  weight is 225 lb 8 oz (102.3 kg). His oral temperature is 96.8 F (36 C) (abnormal). His blood pressure is 132/59 (abnormal) and his pulse is 84. His respiration is 18 and oxygen saturation is 98%.   Wt Readings from Last 3 Encounters:  01/13/17 225 lb 8 oz (102.3 kg)  01/07/17 219 lb (99.3 kg)  12/22/16 230 lb (104.3 kg)     Head and neck exam shows no ocular or oral lesions. He has no palpable cervical or supraclavicular lymph nodes. Lungs are clear bilaterally. Cardiac exam regular rate and rhythm with no murmurs, rubs or bruits. Abdomen is soft. He has  good bowel sounds. There is no fluid wave. There is no guarding or rebound tenderness. There is no palpable liver or spleen tip. Axillary exam shows no bilateral axillary adenopathy. Back exam shows no tenderness over the spine, ribs or hips. Extremities shows no clubbing, cyanosis or edema. Neurological exam shows no focal neurological deficits. Skin exam shows no rashes, ecchymoses or petechia.  Lab Results  Component Value Date   WBC 8.7 01/13/2017   HGB 12.2 (L) 01/13/2017   HCT 38.4 (L) 01/13/2017   MCV 81 (L) 01/13/2017   PLT 455 (H) 01/13/2017     Chemistry      Component  Value Date/Time   NA 141 01/13/2017 1055   K 4.3 01/13/2017 1055   CL 95 (L) 01/13/2017 1055   CO2 30 01/13/2017 1055   BUN 12 01/13/2017 1055   CREATININE 0.7 01/13/2017 1055      Component Value Date/Time   CALCIUM 9.9 01/13/2017 1055   ALKPHOS 176 (H) 01/13/2017 1055   AST 20 01/13/2017 1055   ALT 23 01/13/2017 1055   BILITOT 0.70 01/13/2017 1055         Impression and Plan: Alexander Fry is a 56 her old white male. He has widely metastatic renal cell carcinoma of the right kidney. Thankfully, this is clear-cell histology.  I believe that standard of care right now would be Votrient. I have ordered Votrient for him. Unfortunately, he does not have prescription insurance. I will have to see how we can get this for him. If we cannot, we may have to consider IV therapy with immunotherapy. A recent clinical trial showed that combination of Nivolumab with Ipilimumab was superior to oral Sutent. There was some toxicity with this however. However, he clearly needs to have some type of therapy instituted fairly quickly.  I talked to him about Votrient. I talked him about the side effects. He understands the side effects. We will have to watch his liver function tests. We will have to watch for any pulmonary issues. We need to be careful with diarrhea.  I will go ahead and give him some Xgeva today. He clearly needs [name]. If the pain is not improved, then we may have to consider radiation therapy to the hip.  I talked him about goals of care. He was with his son. He understands fully that when he has is treatable but not curable. He did not ask about timeframe.  I told him that our goal of therapy was to improve his quality of life. I want him to have less pain. I want him to gain weight. I want him to have more functionality. Only way to do this is with therapy. I told him that with Votrient, the chance of responding to treatment should ultimately be about 50%. I would have him retested with a CT  scan in about 2 months.  Again, he understands completely that this is not curable. Thankfully, we have a lot of therapeutic options that we can use if necessary. He still has a good performance status.  I will give him IV fluids today. I think this will help.  I would like to see him back in 2 weeks. We will check his liver function test.  I spent about 45 minutes with he and his son. I answered all of their questions. I believe that he has a very good idea as to what is going on.     [name], MD 2/8/20185:52 PM

## 2017-01-14 ENCOUNTER — Other Ambulatory Visit: Payer: Self-pay | Admitting: *Deleted

## 2017-01-14 DIAGNOSIS — C641 Malignant neoplasm of right kidney, except renal pelvis: Secondary | ICD-10-CM

## 2017-01-14 MED ORDER — PAZOPANIB HCL 200 MG PO TABS: 800.0000 mg | ORAL_TABLET | Freq: Every day | ORAL | 2 refills | Status: DC

## 2017-01-17 ENCOUNTER — Encounter: Payer: Self-pay | Admitting: Hematology & Oncology

## 2017-01-17 ENCOUNTER — Other Ambulatory Visit: Payer: Self-pay | Admitting: *Deleted

## 2017-01-17 DIAGNOSIS — C641 Malignant neoplasm of right kidney, except renal pelvis: Secondary | ICD-10-CM

## 2017-01-17 MED ORDER — PAZOPANIB HCL 200 MG PO TABS: 800.0000 mg | ORAL_TABLET | Freq: Every day | ORAL | 2 refills | Status: DC

## 2017-01-17 MED FILL — VOTRIENT 200 MG TABLET: 200 | 30 days supply | Qty: 120 | Fill #0

## 2017-01-20 ENCOUNTER — Encounter: Payer: Self-pay | Admitting: Hematology & Oncology

## 2017-01-20 ENCOUNTER — Ambulatory Visit (HOSPITAL_BASED_OUTPATIENT_CLINIC_OR_DEPARTMENT_OTHER): Payer: BLUE CROSS/BLUE SHIELD | Admitting: Hematology & Oncology

## 2017-01-20 ENCOUNTER — Other Ambulatory Visit: Payer: Self-pay | Admitting: Hematology & Oncology

## 2017-01-20 VITALS — BP 118/88 | HR 88 | Temp 98.1°F | Resp 18 | Wt 220.0 lb

## 2017-01-20 DIAGNOSIS — C787 Secondary malignant neoplasm of liver and intrahepatic bile duct: Secondary | ICD-10-CM | POA: Diagnosis not present

## 2017-01-20 DIAGNOSIS — Z1389 Encounter for screening for other disorder: Secondary | ICD-10-CM

## 2017-01-20 DIAGNOSIS — C641 Malignant neoplasm of right kidney, except renal pelvis: Secondary | ICD-10-CM

## 2017-01-20 DIAGNOSIS — Z0189 Encounter for other specified special examinations: Secondary | ICD-10-CM

## 2017-01-20 DIAGNOSIS — C7951 Secondary malignant neoplasm of bone: Secondary | ICD-10-CM

## 2017-01-20 DIAGNOSIS — C7800 Secondary malignant neoplasm of unspecified lung: Secondary | ICD-10-CM

## 2017-01-21 ENCOUNTER — Encounter: Payer: Self-pay | Admitting: Hematology & Oncology

## 2017-01-21 ENCOUNTER — Encounter: Payer: Self-pay | Admitting: *Deleted

## 2017-01-21 ENCOUNTER — Telehealth: Payer: Self-pay | Admitting: *Deleted

## 2017-01-21 NOTE — Telephone Encounter (Signed)
Oral Chemotherapy Follow-Up Form Original Start date of oral chemotherapy: 01/14/2017 ? Called patient today to follow up regarding patient's oral chemotherapy medication: Votrient 800mg  daily  Pt is doing well today  Pt reports 0 tablets/doses missed in the last week/month.   Pt reports the following side effects: None Other Issues: None

## 2017-01-21 NOTE — Progress Notes (Signed)
Hematology and Oncology Follow Up Visit  Alexander Fry [id number] [birth year] 56 y.o. 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 He is doing well. He is now on Votrient. He is tolerated this well so far.  He wanted to talk to me about being able to work. Apparently, his company is worried about him being on chemotherapy and also on pain medication. He has tolerated both very well so far. The pain medication is actually making him very functional. He's had no adverse side effects from the pain medication. He is not confused. He is not fatigued. He is not wished to have a radiation therapy right now. He would like to hold off on radiation therapy until he is sees how the Votrient will work.  He has had no diarrhea. In fact, he's a little constipation. I told him to try some MiraLAX.  He's had no fever. He's had no cough. There's been no bleeding.  He's had no leg swelling.  This is actually the best that I have seen him look since I first saw him. Overall, I would say that his performance status is ECOG 1.  Medications:  Current Outpatient Prescriptions:  .  fentaNYL (DURAGESIC - DOSED MCG/HR) 12 MCG/HR, Place 2 patches (25 mcg total) onto the skin every 3 (three) days., Disp: 10 patch, Rfl: 0 .  HYDROmorphone (DILAUDID) 4 MG tablet, Take 1 tablet (4 mg total) by mouth every 4 (four) hours as needed for severe pain., Disp: 90 tablet, Rfl: 0 .  ondansetron (ZOFRAN) 8 MG tablet, Take 1 tablet (8 mg total) by mouth every 8 (eight) hours as needed for nausea or vomiting., Disp: 40 tablet, Rfl: 3 .  pazopanib (VOTRIENT) 200 MG tablet, Take 4 tablets (800 mg total) by mouth daily. Take on an empty stomach., Disp: 120 tablet, Rfl: 2 .  prochlorperazine  (COMPAZINE) 10 MG tablet, Take 1 tablet (10 mg total) by mouth every 6 (six) hours as needed for nausea or vomiting., Disp: 60 tablet, Rfl: 0 .  tamsulosin (FLOMAX) 0.4 MG CAPS capsule, Take 1 capsule (0.4 mg total) by mouth daily., Disp: 30 capsule, Rfl: 3  Allergies: No Known Allergies  Past Medical History, Surgical history, Social history, and Family History were reviewed and updated.  Review of Systems:  As above  Physical Exam:  weight is 220 lb (99.8 kg). His oral temperature is 98.1 F (36.7 C). His blood pressure is 118/88 and his pulse is 88. His respiration is 18 and oxygen saturation is 95%.   Wt Readings from Last 3 Encounters:  01/20/17 220 lb (99.8 kg)  01/13/17 225 lb 8 oz (102.3 kg)  01/07/17 219 lb (99.3 kg)     Head and neck exam shows no ocular or oral lesions. He has no palpable cervical or supraclavicular lymph nodes. Lungs are clear bilaterally. Cardiac exam regular rate and rhythm with no murmurs, rubs or bruits. Abdomen is soft. He has good bowel sounds. There is no fluid wave. There is no guarding or rebound tenderness. There is no palpable liver or spleen tip. Axillary exam shows no bilateral axillary adenopathy. Back exam shows no tenderness over the spine, ribs or hips. Extremities shows no clubbing, cyanosis or edema. Neurological exam shows no focal neurological deficits. Skin  exam shows no rashes, ecchymoses or petechia.  Lab Results  Component Value Date   WBC 8.7 01/13/2017   HGB 12.2 (L) 01/13/2017   HCT 38.4 (L) 01/13/2017   MCV 81 (L) 01/13/2017   PLT 455 (H) 01/13/2017     Chemistry      Component Value Date/Time   NA 141 01/13/2017 1055   K 4.3 01/13/2017 1055   CL 95 (L) 01/13/2017 1055   CO2 30 01/13/2017 1055   BUN 12 01/13/2017 1055   CREATININE 0.7 01/13/2017 1055      Component Value Date/Time   CALCIUM 9.9 01/13/2017 1055   ALKPHOS 176 (H) 01/13/2017 1055   AST 20 01/13/2017 1055   ALT 23 01/13/2017 1055   BILITOT 0.70  01/13/2017 1055         Impression and Plan: Alexander Fry is a 56 her old white male. He has widely metastatic renal cell carcinoma of the right kidney. Thankfully, this is clear-cell histology.  So far, he is doing quite well.  I don't see any problems with him wanting to work. I certainly would be okay with this. I don't think there are any restrictions although I told him that there really should be no lifting of more than 25 pounds.  We will continue him on his regular schedule. He is getting the Xgeva to try to help with his bones.  We can always consider radiation therapy for him if he has worsening issues with his right hip.  I spent about 25 minutes with him. I answered all other questions. We will write him a letter for his work so that they will have our documentation that he is okay for work. He has a lot of commonsense so he will know when he will have issues and will need to be out of work. If that does happen, we will be able to get him disability.   [name], MD 2/16/20181:11 PM

## 2017-01-22 ENCOUNTER — Ambulatory Visit (HOSPITAL_BASED_OUTPATIENT_CLINIC_OR_DEPARTMENT_OTHER)
Admission: RE | Admit: 2017-01-22 | Discharge: 2017-01-22 | Disposition: A | Discharge status: Home / Self Care | Payer: BLUE CROSS/BLUE SHIELD | Source: Ambulatory Visit | Attending: Hematology & Oncology | Admitting: Hematology & Oncology

## 2017-01-22 DIAGNOSIS — Z1389 Encounter for screening for other disorder: Secondary | ICD-10-CM | POA: Diagnosis not present

## 2017-01-22 DIAGNOSIS — C799 Secondary malignant neoplasm of unspecified site: Secondary | ICD-10-CM | POA: Insufficient documentation

## 2017-01-22 DIAGNOSIS — C641 Malignant neoplasm of right kidney, except renal pelvis: Secondary | ICD-10-CM | POA: Diagnosis present

## 2017-01-22 MED ORDER — GADOBENATE DIMEGLUMINE 529 MG/ML IV SOLN: 20.0000 mL | Freq: Once | INTRAVENOUS | Status: DC | PRN

## 2017-01-24 ENCOUNTER — Ambulatory Visit (HOSPITAL_BASED_OUTPATIENT_CLINIC_OR_DEPARTMENT_OTHER): Payer: BLUE CROSS/BLUE SHIELD | Admitting: Hematology & Oncology

## 2017-01-24 ENCOUNTER — Other Ambulatory Visit: Payer: Self-pay | Admitting: Radiation Therapy

## 2017-01-24 ENCOUNTER — Telehealth: Payer: Self-pay

## 2017-01-24 ENCOUNTER — Ambulatory Visit: Payer: BLUE CROSS/BLUE SHIELD | Admitting: Radiation Oncology

## 2017-01-24 DIAGNOSIS — C7949 Secondary malignant neoplasm of other parts of nervous system: Principal | ICD-10-CM

## 2017-01-24 DIAGNOSIS — C7951 Secondary malignant neoplasm of bone: Secondary | ICD-10-CM | POA: Diagnosis not present

## 2017-01-24 DIAGNOSIS — C787 Secondary malignant neoplasm of liver and intrahepatic bile duct: Secondary | ICD-10-CM

## 2017-01-24 DIAGNOSIS — C641 Malignant neoplasm of right kidney, except renal pelvis: Secondary | ICD-10-CM | POA: Diagnosis not present

## 2017-01-24 DIAGNOSIS — C7800 Secondary malignant neoplasm of unspecified lung: Secondary | ICD-10-CM

## 2017-01-24 DIAGNOSIS — C7931 Secondary malignant neoplasm of brain: Secondary | ICD-10-CM

## 2017-01-24 MED ORDER — FENTANYL 25 MCG/HR TD PT72: 25.0000 ug | MEDICATED_PATCH | TRANSDERMAL | 0 refills | Status: DC

## 2017-01-24 NOTE — Telephone Encounter (Signed)
Received confirmation that pt's Votrient has been approved from 11/14/16 through 01/14/2018 per [facility]. Auth # [id number] dph

## 2017-01-24 NOTE — Addendum Note (Signed)
Addended by: [name] on: 01/24/2017 08:33 AM   Modules accepted: Orders

## 2017-01-25 NOTE — Progress Notes (Signed)
Location/Histology of Brain Tumor:  01/22/17 MRI Brain IMPRESSION: 1. 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 Mild localized vasogenic edema without significant mass effect. 2. Additional osseous metastasis positioned at the superolateral aspect of the bony left orbit. Evidence for possible early extension into the superolateral aspect of the bony left orbit with probable underlying dural involvement. Correlation with dedicated CT is suggested for complete evaluation. 3. Additional punctate focus of enhancement within the parasagittal right parietal lobe as above, indeterminate, but could reflect a tiny early metastatic lesion. Attention at follow-up recommended  Patient presented with symptoms of:  None, MRI Brain performed for staging.   Past or anticipated interventions, if any, per neurosurgery:  None  Past or anticipated interventions, if any, per medical oncology:  Dr. Marin Olp last saw 01/24/17 Current Therapy:         Votrient 800 mg by mouth  Xgeva 120 mg subcutaneous every month                                       Dose of Decadron, if applicable: N/A  Recent neurologic symptoms, if any:   Seizures: No  Headaches: No  Nausea: He has had some nausea, and feels like it might have been related to starting his chemotherapy pills.  Dizziness/ataxia: No  Difficulty with hand coordination: No  Focal numbness/weakness: He does have general weakness since his Renal Cell Diagnosis.   Visual deficits/changes: No  Confusion/Memory deficits: No  Painful bone metastases at present, if any: His Right Hip is painful. He has tingling to his Right Shin area. He is using fentanyl patch with relief. He has decreased his use of dilaudid pills, but still uses them occasionally.   SAFETY ISSUES:  Prior radiation? No  Pacemaker/ICD? No  Possible current pregnancy? N/A  Is the patient on methotrexate? 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   The patient reports Dr. Marin Olp had mentioned possible radiation before his MRI to his Right Leg. He has questions about this today.   BP 127/88   Pulse 78   Temp 98.7 F (37.1 C)   Ht 6' (1.829 m)   Wt 213 lb 9.6 oz (96.9 kg)   SpO2 97% Comment: room air  BMI 28.97 kg/m    Wt Readings from Last 3 Encounters:  01/28/17 213 lb 9.6 oz (96.9 kg)  01/20/17 220 lb (99.8 kg)  01/13/17 225 lb 8 oz (102.3 kg)

## 2017-01-25 NOTE — Progress Notes (Signed)
Hematology and Oncology Follow Up Visit  Joe Fazzina [id number] 02-Apr-1961 56 y.o. 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 Baltzer is back for follow-up. 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 These were both quite small. There were located in the right frontal lobe. One measures 8 x 10 x 11 mm. The second is much smaller. No mention is made as to size. Also noted is an osseous metastasis along the left orbit measuring 17 x 26 x 31 mm.  There is no vasogenic edema noted.  He's had no neurological issues. There is no visual issues.  I have spoken with radiation oncology. I think he would be a good candidate for SRS. Hopefully they will see him this week.  Vital think he needs any kind of steroids.  He has go to [place] for his mother's 80th birthday. I know this is a big event. I want him to be able to go.  He has started the Votrient. So far, he's done well with this.   Overall, I would say that his performance status is ECOG 1.  Medications:  Current Outpatient Prescriptions:  .  fentaNYL (DURAGESIC - DOSED MCG/HR) 25 MCG/HR patch, Place 1 patch (25 mcg total) onto the skin every 3 (three) days., Disp: 10 patch, Rfl: 0 .  HYDROmorphone (DILAUDID) 4 MG tablet, Take 1 tablet (4 mg total) by mouth every 4 (four) hours as needed for severe pain., Disp: 90 tablet, Rfl: 0 .  ondansetron (ZOFRAN) 8 MG tablet, Take 1 tablet (8 mg total) by mouth every 8 (eight) hours as needed for nausea or vomiting., Disp: 40 tablet, Rfl: 3 .  pazopanib (VOTRIENT) 200 MG tablet, Take 4 tablets (800 mg total) by mouth daily. Take on an empty stomach., Disp: 120 tablet, Rfl: 2 .  prochlorperazine (COMPAZINE) 10 MG tablet, Take 1 tablet (10 mg total) by  mouth every 6 (six) hours as needed for nausea or vomiting., Disp: 60 tablet, Rfl: 0 .  tamsulosin (FLOMAX) 0.4 MG CAPS capsule, Take 1 capsule (0.4 mg total) by mouth daily., Disp: 30 capsule, Rfl: 3  Allergies: No Known Allergies  Past Medical History, Surgical history, Social history, and Family History were reviewed and updated.  Review of Systems:  As above  Physical Exam:  vitals were not taken for this visit.  Wt Readings from Last 3 Encounters:  01/20/17 220 lb (99.8 kg)  01/13/17 225 lb 8 oz (102.3 kg)  01/07/17 219 lb (99.3 kg)     Head and neck exam shows no ocular or oral lesions. He has no palpable cervical or supraclavicular lymph nodes. Lungs are clear bilaterally. Cardiac exam regular rate and rhythm with no murmurs, rubs or bruits. Abdomen is soft. He has good bowel sounds. There is no fluid wave. There is no guarding or rebound tenderness. There is no palpable liver or spleen tip. Axillary exam shows no bilateral axillary adenopathy. Back exam shows no tenderness over the spine, ribs or hips. Extremities shows no clubbing, cyanosis or edema. Neurological exam shows no focal neurological deficits. Skin exam shows no rashes, ecchymoses or petechia.  Lab Results  Component Value Date   WBC 8.7 01/13/2017   HGB 12.2 (L) 01/13/2017   HCT 38.4 (L) 01/13/2017  MCV 81 (L) 01/13/2017   PLT 455 (H) 01/13/2017     Chemistry      Component Value Date/Time   NA 141 01/13/2017 1055   K 4.3 01/13/2017 1055   CL 95 (L) 01/13/2017 1055   CO2 30 01/13/2017 1055   BUN 12 01/13/2017 1055   CREATININE 0.7 01/13/2017 1055      Component Value Date/Time   CALCIUM 9.9 01/13/2017 1055   ALKPHOS 176 (H) 01/13/2017 1055   AST 20 01/13/2017 1055   ALT 23 01/13/2017 1055   BILITOT 0.70 01/13/2017 1055         Impression and Plan: Mr. Domres is a 56 Year old white male. He has widely metastatic renal cell carcinoma of the right kidney. Thankfully, this is clear-cell  histology.  I suppose that it should be no surprise that he has brain metastasis. Again exam he may have just the one lesion. This second lesion is unclear as to whether or not it is malignant.  Hopefully, radiation oncology can get to him this week. I'm sure that they will want to do a 3T  MRI.  Again I don't thing we had to give him Decadron.  We will keep his appointments as scheduled.  [name], MD 2/20/20189:54 AM

## 2017-01-27 ENCOUNTER — Inpatient Hospital Stay: Admission: RE | Admit: 2017-01-27 | Payer: BLUE CROSS/BLUE SHIELD | Source: Ambulatory Visit

## 2017-01-27 ENCOUNTER — Other Ambulatory Visit (HOSPITAL_BASED_OUTPATIENT_CLINIC_OR_DEPARTMENT_OTHER): Payer: BLUE CROSS/BLUE SHIELD

## 2017-01-27 ENCOUNTER — Ambulatory Visit: Payer: BLUE CROSS/BLUE SHIELD | Admitting: Hematology & Oncology

## 2017-01-27 DIAGNOSIS — C641 Malignant neoplasm of right kidney, except renal pelvis: Secondary | ICD-10-CM

## 2017-01-27 LAB — CBC WITH DIFFERENTIAL (CANCER CENTER ONLY)
BASO#: 0 10*3/uL (ref 0.0–0.2)
BASO%: 0.4 % (ref 0.0–2.0)
EOS ABS: 0.1 10*3/uL (ref 0.0–0.5)
EOS%: 1.3 % (ref 0.0–7.0)
HEMATOCRIT: 41.4 % (ref 38.7–49.9)
HEMOGLOBIN: 13.6 g/dL (ref 13.0–17.1)
LYMPH#: 2.6 10*3/uL (ref 0.9–3.3)
LYMPH%: 37.9 % (ref 14.0–48.0)
MCH: 25.4 pg — AB (ref 28.0–33.4)
MCHC: 32.9 g/dL (ref 32.0–35.9)
MCV: 77 fL — ABNORMAL LOW (ref 82–98)
MONO#: 0.8 10*3/uL (ref 0.1–0.9)
MONO%: 11.4 % (ref 0.0–13.0)
NEUT%: 49 % (ref 40.0–80.0)
NEUTROS ABS: 3.3 10*3/uL (ref 1.5–6.5)
Platelets: 369 10*3/uL (ref 145–400)
RBC: 5.35 10*6/uL (ref 4.20–5.70)
RDW: 14.4 % (ref 11.1–15.7)
WBC: 6.8 10*3/uL (ref 4.0–10.0)

## 2017-01-27 LAB — COMPREHENSIVE METABOLIC PANEL
ALBUMIN: 2.9 g/dL — AB (ref 3.5–5.0)
ALK PHOS: 123 U/L (ref 40–150)
ALT: 18 U/L (ref 0–55)
ANION GAP: 9 meq/L (ref 3–11)
AST: 21 U/L (ref 5–34)
BILIRUBIN TOTAL: 0.36 mg/dL (ref 0.20–1.20)
BUN: 10.1 mg/dL (ref 7.0–26.0)
CALCIUM: 9.7 mg/dL (ref 8.4–10.4)
CO2: 26 mEq/L (ref 22–29)
Chloride: 103 mEq/L (ref 98–109)
Creatinine: 0.7 mg/dL (ref 0.7–1.3)
Glucose: 91 mg/dl (ref 70–140)
Potassium: 4.4 mEq/L (ref 3.5–5.1)
Sodium: 138 mEq/L (ref 136–145)
TOTAL PROTEIN: 8.3 g/dL (ref 6.4–8.3)

## 2017-01-27 LAB — LACTATE DEHYDROGENASE: LDH: 228 U/L (ref 125–245)

## 2017-01-27 MED FILL — fentaNYL 25 MCG/HR PT72: 25 | 30 days supply | Qty: 10 | Fill #0

## 2017-01-28 ENCOUNTER — Ambulatory Visit
Admission: RE | Admit: 2017-01-28 | Discharge: 2017-01-28 | Disposition: A | Discharge status: Home / Self Care | Payer: BLUE CROSS/BLUE SHIELD | Source: Ambulatory Visit | Attending: Radiation Oncology | Admitting: Radiation Oncology

## 2017-01-28 ENCOUNTER — Encounter: Payer: Self-pay | Admitting: Radiation Oncology

## 2017-01-28 DIAGNOSIS — Z510 Encounter for antineoplastic radiation therapy: Secondary | ICD-10-CM | POA: Insufficient documentation

## 2017-01-28 DIAGNOSIS — C7931 Secondary malignant neoplasm of brain: Secondary | ICD-10-CM

## 2017-01-28 DIAGNOSIS — C649 Malignant neoplasm of unspecified kidney, except renal pelvis: Secondary | ICD-10-CM | POA: Insufficient documentation

## 2017-01-28 DIAGNOSIS — C7951 Secondary malignant neoplasm of bone: Secondary | ICD-10-CM | POA: Diagnosis not present

## 2017-01-28 HISTORY — DX: Unspecified retinal disorder: H35.9

## 2017-01-28 HISTORY — DX: Pneumonia, unspecified organism: J18.9

## 2017-01-28 NOTE — Progress Notes (Signed)
Radiation Oncology         [phone]) [phone] ________________________________  Initial Outpatient Consultation  Name: Alexander Fry MRN: [id number]  Date: 01/28/2017  DOB: 1961-11-28  [id number] Rob Bunting, PA-C  Ennever, Rudell Cobb, MD   REFERRING PHYSICIAN: [name], MD  DIAGNOSIS: Diagnoses of Brain metastases [facility]) and Bone metastases [facility]) were pertinent to this visit.    ICD-9-CM ICD-10-CM   1. Brain metastases (HCC) 198.3 C79.31   2. Bone metastases (HCC) 198.5 C79.51    STAGE IV RENAL CANCER  HISTORY OF PRESENT ILLNESS::Alexander Fry is a 56 y.o. male with a history of metastatic renal carcinoma   He began having a cough in early September and low back pain which he related to strain from coughing. This led to a CXR which was abnormal and eventually at CT scan in January which showed bilateral pulmonary nodules, extensive mediastinal and hilar LAD, and lytic bony disease. He then underwent PET scan on 01/03/17 which showed known disease and a 0000000 cm hypermetabolic mass of the right kidney favored to be a primary renal cell carcinoma. 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 He has since been started on daily pazopanib 800 mg and Xgeva 120 mg subcutaneous every month.   Brain MRI on 01/22/17 showed two intracranial metastatic lesions involving the frontal lobe, one measuring 8 x 10 x 11 mm, and the other much smaller and unmeasured. There is additional osseous metastasis positioned at the superolateral aspect of the bony left orbit, measuring 17 x 26 x 31 mm. There is evidence for possible early extension into the superolateral aspect of the bony left orbit with probable underlying dural involvement.  I looked at the patient's MRI and PET personally and showed the images to him and his son  On review of systems, the patient denies any seizures, headaches, confusion or memory deficits,  dizziness or ataxia, difficulty with hand coordination, or visual deficits or changes. He reports some nausea, which he attributes to his chemotherapy. He reports generalized weakness since his initial diagnosis. The patient reports pain to his right hip and mid to lower lumbar region, as well as tingling to his right shin area. He is using Fentanyl patch with relief. He has decreased his use of dilaudid pills, but still uses them occassionally.  The patient has an upcoming brain 3T MRI on 02/07/17.   PREVIOUS RADIATION THERAPY: No  PAST MEDICAL HISTORY:  has a past medical history of Goals of care, counseling/discussion (12/22/2016); Inguinal hernia of left side without obstruction or gangrene; Pneumonia; Renal cell carcinoma, right ([place]) (01/05/2017); and Retina disorder.    PAST SURGICAL HISTORY: Past Surgical History:  Procedure Laterality Date  . HERNIA REPAIR    . INGUINAL HERNIA REPAIR Left     FAMILY HISTORY: family history includes Alcohol abuse in his brother; Diabetes in his mother; Heart disease in his father and mother.  SOCIAL HISTORY:  reports that he has never smoked. He has never used smokeless tobacco. He reports that he drinks alcohol. He reports that he does not use drugs.  ALLERGIES: Patient has no known allergies.  MEDICATIONS:  Current Outpatient Prescriptions  Medication Sig Dispense Refill  . fentaNYL (DURAGESIC - DOSED MCG/HR) 25 MCG/HR patch Place 1 patch (25 mcg total) onto the skin every 3 (three) days. 10 patch 0  . HYDROmorphone (DILAUDID) 4 MG tablet Take 1 tablet (4 mg total) by  mouth every 4 (four) hours as needed for severe pain. 90 tablet 0  . pazopanib (VOTRIENT) 200 MG tablet Take 4 tablets (800 mg total) by mouth daily. Take on an empty stomach. 120 tablet 2  . tamsulosin (FLOMAX) 0.4 MG CAPS capsule Take 1 capsule (0.4 mg total) by mouth daily. 30 capsule 3  . ondansetron (ZOFRAN) 8 MG tablet Take 1 tablet (8 mg total) by mouth every 8 (eight) hours as  needed for nausea or vomiting. (Patient not taking: Reported on 01/28/2017) 40 tablet 3  . prochlorperazine (COMPAZINE) 10 MG tablet Take 1 tablet (10 mg total) by mouth every 6 (six) hours as needed for nausea or vomiting. (Patient not taking: Reported on 01/28/2017) 60 tablet 0   No current facility-administered medications for this encounter.     REVIEW OF SYSTEMS:   As above   PHYSICAL EXAM:  height is 6' (1.829 m) and weight is 213 lb 9.6 oz (96.9 kg). His temperature is 98.7 F (37.1 C). His blood pressure is 127/88 and his pulse is 78. His oxygen saturation is 97%.   General: Alert and oriented, in no acute distress. HEENT: Extraocular movements are intact. Oropharynx and oral cavity are clear. Neck: Neck is supple, no palpable cervical or supraclavicular lymphadenopathy or masses. Heart: Regular in rate and rhythm with no murmurs. Chest: Clear to auscultation bilaterally. Abdomen: Soft, nontender, nondistended. Extremities: No edema. Lymphatics: see Neck Exam Musculoskeletal: symmetric strength and muscle tone throughout upper body. On exam there is no obvious weakness in his lower extremities.  No reproducible tenderness to palpation in the lumbar spine. Neurologic: No obvious focalities. Speech is fluent. Coordination is intact. Sensation is grossly intact in the lower extremities. Psychiatric: Judgment and insight are intact. Affect is appropriate.  LABORATORY DATA:  Lab Results  Component Value Date   WBC 6.8 01/27/2017   HGB 13.6 01/27/2017   HCT 41.4 01/27/2017   MCV 77 (L) 01/27/2017   PLT 369 01/27/2017   CMP     Component Value Date/Time   NA 138 01/27/2017 1128   K 4.4 01/27/2017 1128   CL 95 (L) 01/13/2017 1055   CO2 26 01/27/2017 1128   GLUCOSE 91 01/27/2017 1128   GLUCOSE 91 01/13/2017 1055   BUN 10.1 01/27/2017 1128   CREATININE 0.7 01/27/2017 1128   CALCIUM 9.7 01/27/2017 1128   PROT 8.3 01/27/2017 1128   ALBUMIN 2.9 (L) 01/27/2017 1128   AST 21  01/27/2017 1128   ALT 18 01/27/2017 1128   ALKPHOS 123 01/27/2017 1128   BILITOT 0.36 01/27/2017 1128     RADIOGRAPHY: Dg Eye Foreign Body  Result Date: 01/22/2017 CLINICAL DATA:  Metal working/exposure; clearance prior to MRI, prior extraction of a bit of metal from a drill removed from his RIGHT eye he thinks EXAM: ORBITS FOR FOREIGN BODY - 2 VIEW COMPARISON:  None FINDINGS: No orbital metallic foreign bodies. Pain and sinuses clear. Nasal septum midline. Bones unremarkable. IMPRESSION: No orbital metallic foreign body identified. Electronically Signed   By: Lavonia Dana M.D.   On: 01/22/2017 15:11   Mr Jeri Cos [id number] Contrast  Result Date: 01/22/2017 CLINICAL DATA:  Initial evaluation for staging. Recently diagnosed with metastatic renal carcinoma. No current neurological complaints. The the the EXAM: MRI HEAD WITHOUT AND WITH CONTRAST TECHNIQUE: Multiplanar, multiecho pulse sequences of the brain and surrounding structures were obtained without and with intravenous contrast. CONTRAST:  20 cc of MultiHance. COMPARISON:  None available. FINDINGS: Brain: Age appropriate cerebral atrophy present.  Minimal patchy T2/FLAIR hyperintensity within the periventricular and deep white matter, most consistent with chronic microvascular ischemic disease, felt to be within normal limits for age. No abnormal foci of restricted diffusion to suggest acute infarct. No evidence for acute or chronic intracranial hemorrhage. No evidence for chronic infarction. There is a ring-enhancing mass centered along the gray-white matter differentiation within the anterior right frontal lobe that measures 8 x 10 x 11 mm, consistent with an intracranial metastasis (series 13, image 38). Mild localized vasogenic edema without significant mass effect. No associated hemorrhage. Second small 4 mm ring-enhancing lesion more anteriorly and inferiorly within the right frontal lobe (series 13, image 33). Associated mild edema without significant  mass effect. Additional osseous metastasis located at the superior/lateral aspect of the bony left orbit measures 17 x 26 x 31 mm in greatest dimensions (series 14, image 25 on coronal sequence, series 13, image 29 on axial sequence, series 15, image 20 on sagittal sequence). Evidence of early extension into the superolateral aspect of the left orbit (series 15, image 25). Additionally, probable underlying involvement of the dura is evidenced by relative thickening and enhancement (series 13, image 32). No mass effect within the underlying brain parenchyma. Additional faint punctate focus of possible enhancement noted within the parasagittal right parietal lobe (series 13, image 37 on axial sequence, series 15, image 11 on sagittal sequence). Finding may reflect an early metastatic lesion or possibly small vascular structure. Attention at follow-up recommended. No other intracranial metastases identified. No other mass lesion. No midline shift or significant mass effect. No hydrocephalus. No extra-axial fluid collection. Major dural sinuses are grossly patent. Pituitary gland and suprasellar region within normal limits. Vascular: Major intracranial vascular flow voids are maintained. Skull and upper cervical spine: Craniocervical junction within normal limits. Visualized upper cervical spine unremarkable. Osseous metastasis at the superolateral left orbit as above. Calvarium otherwise intact. Sinuses/Orbits: Osseous metastasis at the superolateral left orbit as above. Globes and orbital soft tissues otherwise unremarkable. Few scattered retention cyst noted within the maxillary sinuses bilaterally. Paranasal sinuses are otherwise clear. Trace opacity bilateral mastoid air cells. Inner ear structures within normal limits. IMPRESSION: 1. 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 Mild localized vasogenic edema without significant mass effect. 2. Additional osseous metastasis  positioned at the superolateral aspect of the bony left orbit. Evidence for possible early extension into the superolateral aspect of the bony left orbit with probable underlying dural involvement. Correlation with dedicated CT is suggested for complete evaluation. 3. Additional punctate focus of enhancement within the parasagittal right parietal lobe as above, indeterminate, but could reflect a tiny early metastatic lesion. Attention at follow-up recommended. Electronically Signed   By: Jeannine Boga M.D.   On: 01/22/2017 22:38   Nm Pet Image Initial (pi) Skull Base To Thigh  Result Date: 01/03/2017 CLINICAL DATA:  Initial treatment strategy for pulmonary nodules and thoracic lymphadenopathy. EXAM: NUCLEAR MEDICINE PET SKULL BASE TO THIGH TECHNIQUE: 11.5 cm mCi F-18 FDG was injected intravenously. Full-ring PET imaging was performed from the skull base to thigh after the radiotracer. CT data was obtained and used for attenuation correction and anatomic localization. FASTING BLOOD GLUCOSE:  Value: 127 mg/dl COMPARISON:  Chest CT on 12/10/2016 FINDINGS: NECK No hypermetabolic lymph nodes or other masses identified. CHEST Hypermetabolic mediastinal and bilateral hilar lymphadenopathy is seen. Index lymph node in the subcarinal region measures 2.5 cm short axis on image 75/4, with SUV max of 14.5. Numerous hypermetabolic bilateral pulmonary nodules are seen,  consistent with pulmonary metastases. Index subpleural nodule posterior left lower lobe measures 2.5 x 1 cm image 94/4, and has SUV max of 15.2. This may represent a pulmonary or pleural metastasis. No evidence of pleural effusion. ABDOMEN/PELVIS 3.1 cm low-attenuation in segment 7 of the right hepatic lobe measures 3.1 cm on image 102/4, and has SUV max of 7.0. No other definite liver masses are identified on this unenhanced exam. A large hypermetabolic soft tissue mass is seen involving the mid and lower pole of the right kidney, measuring 11.5 x 8.3  cm on image 133/4 . This has SUV max of 161.7, and is consistent with primary renal cell carcinoma. This mass shows involvement of Gerota's fascia. 1.5 cm aorto-caval lymph node is seen on image 141/4, which is hypermetabolic with SUV max of 123XX123. No other hypermetabolic lymph nodes within the abdomen or pelvis. Hypermetabolic activity is seen in both adrenal glands, without evidence of mass on CT. Cholelithiasis is noted, without evidence of cholecystitis. SKELETON Hypermetabolic lytic bone metastases are seen involving L1 and L4 vertebral bodies, with associated right paraspinal soft tissue extension these levels . Index lesion at L4 has SUV max of 7.7. A small lytic hypermetabolic bone metastasis is also seen right ilium. IMPRESSION: 0000000 cm hypermetabolic mass involving middle and lower pole of right kidney, consistent with primary renal cell carcinoma. Metastatic lymphadenopathy in aortocaval space, and throughout the mediastinum and bilateral hilar regions. Diffuse bilateral pulmonary metastases. Liver metastasis in the posterior right hepatic lobe. 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 Electronically Signed   By: Earle Gell M.D.   On: 01/03/2017 09:34   Ct Biopsy  Result Date: 01/07/2017 INDICATION: History of presumed metastatic renal cell carcinoma. Please perform CT-guided biopsy of lytic expansile mass involving the right-sided the L4 vertebral body. EXAM: CT GUIDED BIOPSY OF LYTIC EXPANSILE MASS INVOLVING THE RIGHT-SIDED THE L4 VERTEBRAL BODY COMPARISON:  PET-CT - 01/03/2017 MEDICATIONS: None. ANESTHESIA/SEDATION: Fentanyl 100 mcg IV; Versed 2 mg IV Sedation time: 10 minutes; The patient was continuously monitored during the procedure by the interventional radiology nurse under my direct supervision. CONTRAST:  None. COMPLICATIONS: None immediate. PROCEDURE: Informed consent was obtained from the patient following an explanation of the procedure, risks, benefits and  alternatives. A time out was performed prior to the initiation of the procedure. The patient was positioned prone on the CT table and a limited CT was performed for procedural planning demonstrating grossly unchanged appearance of known lytic expansile mass involving the right-side of the L4 vertebral body with paraspinal extension measuring at least 6.2 x 3.2 cm (image 61, series 2). The procedure was planned. The operative site was prepped and draped in the usual sterile fashion. Appropriate trajectory was confirmed with a 22 gauge spinal needle after the adjacent tissues were anesthetized with 1% Lidocaine with epinephrine. Under intermittent CT guidance, a 17 gauge coaxial needle was advanced into the peripheral aspect of the mass. Appropriate positioning was confirmed and 5 core needle biopsy samples were obtained with an 18 gauge core needle biopsy device. The co-axial needle was removed following the administration of a Gel-Foam slurry. Superficial hemostasis was achieved with manual compression. A limited postprocedural CT was negative for hemorrhage or additional complication. A dressing was placed. The patient tolerated the procedure well without immediate postprocedural complication. IMPRESSION: Technically successful CT guided core needle biopsy of lytic expansile mass involving the right side of the L4 vertebral body. Electronically Signed   By: Eldridge Abrahams.D.  On: 01/07/2017 12:58      IMPRESSION/PLAN: This is a very pleasant 56 y.o. gentleman with metastatic disease to the brain.  I had a lengthy discussion with the patient after reviewing their MRI results with them.  We spoke about whole brain radiotherapy versus stereotactic radiosurgery to the brain. We spoke about the differing risks benefits and side effects of both of these treatments. During part of our discussion, we spoke about the hair loss, fatigue and cognitive effects that can result from whole brain radiotherapy.  Additionally,  we spoke about radionecrosis that can result from stereotactic radiosurgery. I explained that whole brain radiotherapy is more comprehensive and therefore can decrease the chance of recurrences elsewhere in the brain, while stereotactic radiosurgery only treats the areas of gross disease while sparing the rest of the brain parenchyma.  We also discussed the patient's radiating low back and hip pain, with tingling down the front of the right leg, very likely attributed to the L4 lesion. We discussed the potential for radiation therapy to the spine to alleviate some of the patient's discomfort. It may be necessary to have an MRI of this area to determine appropriate treatment - SRS vs conventional.  Will put on for tumor board discussion.   After lengthy discussion, the patient would like to proceed with stereotactic brain radiosurgery to their metastatic disease, pending upcoming MRI results. They will meet with neurosurgery in the near future to discuss this further; a neurosurgeon will participate in their case.  I would like the neurosurgeon to weigh in on whether the other sites of bony disease from the PET seem symptomatic.  Currently his exam and history seem to favor the L4 lesion alone.  CT simulation will take place on 02/08/17 and treatment on 02/11/17  (after his [place] trip)   We will plan to discuss this patient at the upcoming tumor board.  I spent 50 minutes face to face with the patient and more than 50% of that time was spent in counseling and/or coordination of care.    __________________________________________   [name], MD  This document serves as a record of services personally performed by [name], MD. It was created on her behalf by Maryla Morrow, a trained medical scribe. The creation of this record is based on the scribe's personal observations and the provider's statements to them. This document has been checked and approved by the attending provider.

## 2017-01-30 DIAGNOSIS — C7951 Secondary malignant neoplasm of bone: Secondary | ICD-10-CM | POA: Insufficient documentation

## 2017-01-30 DIAGNOSIS — C7931 Secondary malignant neoplasm of brain: Secondary | ICD-10-CM | POA: Insufficient documentation

## 2017-01-31 ENCOUNTER — Ambulatory Visit: Payer: BLUE CROSS/BLUE SHIELD | Admitting: Radiation Oncology

## 2017-02-01 ENCOUNTER — Ambulatory Visit: Payer: BLUE CROSS/BLUE SHIELD | Admitting: Radiation Oncology

## 2017-02-01 ENCOUNTER — Ambulatory Visit: Payer: BLUE CROSS/BLUE SHIELD

## 2017-02-01 ENCOUNTER — Institutional Professional Consult (permissible substitution): Payer: BLUE CROSS/BLUE SHIELD | Admitting: Radiation Oncology

## 2017-02-03 ENCOUNTER — Other Ambulatory Visit: Payer: Self-pay | Admitting: Radiation Therapy

## 2017-02-03 ENCOUNTER — Encounter: Payer: Self-pay | Admitting: Physician Assistant

## 2017-02-03 DIAGNOSIS — C7951 Secondary malignant neoplasm of bone: Secondary | ICD-10-CM

## 2017-02-07 ENCOUNTER — Ambulatory Visit
Admission: RE | Admit: 2017-02-07 | Discharge: 2017-02-07 | Disposition: A | Discharge status: Home / Self Care | Payer: BLUE CROSS/BLUE SHIELD | Source: Ambulatory Visit | Attending: Radiation Oncology | Admitting: Radiation Oncology

## 2017-02-07 ENCOUNTER — Ambulatory Visit (HOSPITAL_BASED_OUTPATIENT_CLINIC_OR_DEPARTMENT_OTHER): Payer: BLUE CROSS/BLUE SHIELD | Admitting: Hematology & Oncology

## 2017-02-07 ENCOUNTER — Ambulatory Visit (HOSPITAL_BASED_OUTPATIENT_CLINIC_OR_DEPARTMENT_OTHER): Payer: BLUE CROSS/BLUE SHIELD

## 2017-02-07 VITALS — BP 143/78 | HR 69 | Temp 98.3°F | Wt 215.1 lb

## 2017-02-07 DIAGNOSIS — C7800 Secondary malignant neoplasm of unspecified lung: Secondary | ICD-10-CM | POA: Diagnosis not present

## 2017-02-07 DIAGNOSIS — C641 Malignant neoplasm of right kidney, except renal pelvis: Secondary | ICD-10-CM | POA: Diagnosis not present

## 2017-02-07 DIAGNOSIS — C7951 Secondary malignant neoplasm of bone: Secondary | ICD-10-CM

## 2017-02-07 DIAGNOSIS — C787 Secondary malignant neoplasm of liver and intrahepatic bile duct: Secondary | ICD-10-CM

## 2017-02-07 DIAGNOSIS — C7931 Secondary malignant neoplasm of brain: Secondary | ICD-10-CM

## 2017-02-07 DIAGNOSIS — C7949 Secondary malignant neoplasm of other parts of nervous system: Principal | ICD-10-CM

## 2017-02-07 LAB — CBC WITH DIFFERENTIAL (CANCER CENTER ONLY)
BASO#: 0 10*3/uL (ref 0.0–0.2)
BASO%: 0.5 % (ref 0.0–2.0)
EOS%: 7.5 % — AB (ref 0.0–7.0)
Eosinophils Absolute: 0.5 10*3/uL (ref 0.0–0.5)
HEMATOCRIT: 42.2 % (ref 38.7–49.9)
HEMOGLOBIN: 13.9 g/dL (ref 13.0–17.1)
LYMPH#: 2.4 10*3/uL (ref 0.9–3.3)
LYMPH%: 39.5 % (ref 14.0–48.0)
MCH: 25.8 pg — ABNORMAL LOW (ref 28.0–33.4)
MCHC: 32.9 g/dL (ref 32.0–35.9)
MCV: 78 fL — AB (ref 82–98)
MONO#: 0.6 10*3/uL (ref 0.1–0.9)
MONO%: 9.4 % (ref 0.0–13.0)
NEUT%: 43.1 % (ref 40.0–80.0)
NEUTROS ABS: 2.7 10*3/uL (ref 1.5–6.5)
Platelets: 244 10*3/uL (ref 145–400)
RBC: 5.38 10*6/uL (ref 4.20–5.70)
RDW: 16.1 % — AB (ref 11.1–15.7)
WBC: 6.2 10*3/uL (ref 4.0–10.0)

## 2017-02-07 LAB — CMP (CANCER CENTER ONLY)
ALK PHOS: 134 U/L — AB (ref 26–84)
ALT: 49 U/L — AB (ref 10–47)
AST: 50 U/L — AB (ref 11–38)
Albumin: 3 g/dL — ABNORMAL LOW (ref 3.3–5.5)
BILIRUBIN TOTAL: 0.6 mg/dL (ref 0.20–1.60)
BUN, Bld: 10 mg/dL (ref 7–22)
CALCIUM: 9.2 mg/dL (ref 8.0–10.3)
CO2: 27 mEq/L (ref 18–33)
Chloride: 101 mEq/L (ref 98–108)
Creat: 0.8 mg/dl (ref 0.6–1.2)
GLUCOSE: 131 mg/dL — AB (ref 73–118)
Potassium: 3.7 mEq/L (ref 3.3–4.7)
Sodium: 139 mEq/L (ref 128–145)
TOTAL PROTEIN: 7.8 g/dL (ref 6.4–8.1)

## 2017-02-07 MED ORDER — DEXAMETHASONE 4 MG PO TABS: ORAL_TABLET | ORAL | 3 refills | Status: DC

## 2017-02-07 MED ORDER — GADOBENATE DIMEGLUMINE 529 MG/ML IV SOLN
20.0000 mL | Freq: Once | INTRAVENOUS | Status: AC | PRN
  Administered 2017-02-07: 20 mL via INTRAVENOUS

## 2017-02-07 MED FILL — DEXAMETHASONE 4 MG TABLET: 4 | 15 days supply | Qty: 60 | Fill #0

## 2017-02-07 NOTE — Progress Notes (Signed)
Has armband been applied?  Yes  Does patient have an allergy to IV contrast dye?: NO   Has patient ever received premedication for IV contrast dye?: NO  Does patient take metformin?: NO  If patient does take metformin when was the last dose: not diabetic  Date of lab work: 01/27/17 BUN: 10.1 CR: 0.7  IV site: LAC  Has IV site been added to flowsheet?  Yes Patient gave name and dob as identification, s IV start, tried left hand first,  Then tried left antecubital x 1 attempt, excellent blood return, patient toleratedd well 9:48 AM  [name], brain navigator removed patient IV catheter,tiop intact stated  10:46 AM

## 2017-02-07 NOTE — Progress Notes (Signed)
Hematology and Oncology Follow Up Visit  Alexander Fry [id number] [birth year] 56 y.o. 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 He is started the Votrient. So far, he has done well with Votrient. He's had no diarrhea. He's had no mouth sores. He's had no abdominal pain.  He still has a tingling in the right leg. At believe that this is from metastatic disease to the spine.  He has seen Dr. Camelia Eng of radiation oncology. She will do his SRS this Friday. His hale she also will consider some external beam radiation for his back.  He had an MRI of his brain today. This showed increase in the size of his metastatic disease. A right frontal lesion measures 5.5 mm. A second lesion measures 12 x 12 mm. A left frontal calvarial lesion measures 27 x 12 mm.  He's had no complaints of headache. There is no visual issues.  He had a wonderful time up in [place] for his mother's 80th birthday. It was a very nice family event. I'm glad he could go.  He has had no problems with cough. He's had no shortness of breath.  His pain regimen with the fentanyl patch and oxycodone seem to be helping quite a bit.  I do want to have moderate some Decadron. This is, more so for the spinal lesion and not so much for the brain metastasis. It does not look like he has much in the way of vasogenic edema.  Overall, his performance status is ECOG 1.   Medications:  Current Outpatient Prescriptions:  .  dexamethasone (DECADRON) 4 MG tablet, Take 4 pills a day with food., Disp: 60 tablet, Rfl: 3 .  fentaNYL (DURAGESIC - DOSED MCG/HR) 25 MCG/HR patch, Place 1 patch (25 mcg total) onto the skin every 3 (three) days., Disp: 10 patch, Rfl: 0 .  HYDROmorphone (DILAUDID) 4 MG tablet,  Take 1 tablet (4 mg total) by mouth every 4 (four) hours as needed for severe pain., Disp: 90 tablet, Rfl: 0 .  ondansetron (ZOFRAN) 8 MG tablet, Take 1 tablet (8 mg total) by mouth every 8 (eight) hours as needed for nausea or vomiting. (Patient not taking: Reported on 01/28/2017), Disp: 40 tablet, Rfl: 3 .  pazopanib (VOTRIENT) 200 MG tablet, Take 4 tablets (800 mg total) by mouth daily. Take on an empty stomach., Disp: 120 tablet, Rfl: 2 .  prochlorperazine (COMPAZINE) 10 MG tablet, Take 1 tablet (10 mg total) by mouth every 6 (six) hours as needed for nausea or vomiting. (Patient not taking: Reported on 01/28/2017), Disp: 60 tablet, Rfl: 0 .  tamsulosin (FLOMAX) 0.4 MG CAPS capsule, Take 1 capsule (0.4 mg total) by mouth daily., Disp: 30 capsule, Rfl: 3  Allergies: No Known Allergies  Past Medical History, Surgical history, Social history, and Family History were reviewed and updated.  Review of Systems:  As above  Physical Exam:  weight is 215 lb 1 oz (97.6 kg). His oral temperature is 98.3 F (36.8 C). His blood pressure is 143/78 (abnormal) and his pulse is 69.   Wt Readings from Last 3 Encounters:  02/07/17 215 lb 1 oz (97.6 kg)  01/28/17 213 lb 9.6 oz (96.9 kg)  01/20/17 220 lb (99.8 kg)     Head and neck  exam shows no ocular or oral lesions. He has no palpable cervical or supraclavicular lymph nodes. Lungs are clear bilaterally. Cardiac exam regular rate and rhythm with no murmurs, rubs or bruits. Abdomen is soft. He has good bowel sounds. There is no fluid wave. There is no guarding or rebound tenderness. There is no palpable liver or spleen tip. Axillary exam shows no bilateral axillary adenopathy. Back exam shows no tenderness over the spine, ribs or hips. Extremities shows no clubbing, cyanosis or edema. Neurological exam shows no focal neurological deficits. Skin exam shows no rashes, ecchymoses or petechia.  Lab Results  Component Value Date   WBC 6.2 02/07/2017   HGB 13.9  02/07/2017   HCT 42.2 02/07/2017   MCV 78 (L) 02/07/2017   PLT 244 02/07/2017     Chemistry      Component Value Date/Time   NA 139 02/07/2017 1527   NA 138 01/27/2017 1128   K 3.7 02/07/2017 1527   K 4.4 01/27/2017 1128   CL 101 02/07/2017 1527   CO2 27 02/07/2017 1527   CO2 26 01/27/2017 1128   BUN 10 02/07/2017 1527   BUN 10.1 01/27/2017 1128   CREATININE 0.8 02/07/2017 1527   CREATININE 0.7 01/27/2017 1128      Component Value Date/Time   CALCIUM 9.2 02/07/2017 1527   CALCIUM 9.7 01/27/2017 1128   ALKPHOS 134 (H) 02/07/2017 1527   ALKPHOS 123 01/27/2017 1128   AST 50 (H) 02/07/2017 1527   AST 21 01/27/2017 1128   ALT 49 (H) 02/07/2017 1527   ALT 18 01/27/2017 1128   BILITOT 0.60 02/07/2017 1527   BILITOT 0.36 01/27/2017 1128         Impression and Plan: Alexander Fry is a 56 year old white male. He has widely metastatic renal cell carcinoma of the right kidney. Thankfully, this is clear-cell histology.  It is still too early to know how well he is responding. He only has been on the Votrient for less than one month.   He clearly needs the [facility] for the brain lesions. I also suspect that he will need external beam radiation for his back. Hopefully, the Decadron will help his back and brain lesions with respect to edema.   His liver function tests are slightly elevated. This could be from the [place].  I need to follow-up and repeat his liver function studies in a week or so.   I will plan to see him back myself in 3 weeks.   I spent about 35 minutes with him today.   [name], MD 3/5/20186:14 PM

## 2017-02-08 ENCOUNTER — Ambulatory Visit
Admission: RE | Admit: 2017-02-08 | Discharge: 2017-02-08 | Disposition: A | Discharge status: Home / Self Care | Payer: BLUE CROSS/BLUE SHIELD | Source: Ambulatory Visit | Attending: Radiation Oncology | Admitting: Radiation Oncology

## 2017-02-08 VITALS — BP 141/91 | HR 83 | Temp 97.8°F | Resp 20 | Wt 215.0 lb

## 2017-02-08 DIAGNOSIS — C7931 Secondary malignant neoplasm of brain: Secondary | ICD-10-CM

## 2017-02-08 DIAGNOSIS — Z510 Encounter for antineoplastic radiation therapy: Secondary | ICD-10-CM | POA: Diagnosis not present

## 2017-02-08 MED ORDER — SODIUM CHLORIDE 0.9% FLUSH
10.0000 mL | Freq: Once | INTRAVENOUS | Status: AC
Start: 2017-02-08 — End: 2017-02-08
  Administered 2017-02-08: 10 mL via INTRAVENOUS

## 2017-02-08 NOTE — Progress Notes (Signed)
  Name: Alexander Fry MRN: [id number]  Date: 02/08/2017  DOB: Mar 04, 1961  SIMULATION AND TREATMENT PLANNING NOTE    ICD-9-CM ICD-10-CM   1. Brain metastases ([place]) 198.3 C79.31       Radiation Oncology         [phone] ________________________________  Name: Alexander Fry MRN: [id number]  Date: 02/08/2017  DOB: August 18, 1961  SIMULATION AND TREATMENT PLANNING NOTE    ICD-9-CM ICD-10-CM   1. Brain metastases ([place]) 198.3 C79.31     DIAGNOSIS:  As above  NARRATIVE:  The patient was brought to the [facility].  Identity was confirmed.  All relevant records and images related to the planned course of therapy were reviewed.  The patient freely provided informed written consent to proceed with treatment after reviewing the details related to the planned course of therapy. The consent form was witnessed and verified by the simulation staff. Intravenous access was established for contrast administration. Then, the patient was set-up in a stable reproducible supine position for radiation therapy.  A relocatable thermoplastic stereotactic head frame was fabricated for precise immobilization.  CT images were obtained.  Surface markings were placed.  The CT images were loaded into the planning software and fused with the patient's targeting MRI scan.  Then the target and avoidance structures were contoured.  Treatment planning then occurred.  The radiation prescription was entered and confirmed.  I have requested 3D planning  I have requested a DVH of the following structures: Brain stem, brain, left eye, right eye, lenses, optic chiasm, target volumes, uninvolved brain, and normal tissue.    SPECIAL TREATMENT PROCEDURE:  The planned course of therapy using radiation constitutes a special treatment procedure. Special care is required in the management of this patient for the following reasons:  High dose per fraction requiring special monitoring for increased toxicities of treatment including daily  imaging.  The special nature of the planned course of radiotherapy will require increased physician supervision and oversight to ensure patient's safety with optimal treatment outcomes.  PLAN:  The patient will receive 20 Gy in 1 fraction to the two parenchymal brain metastases  ________________________________    [name], MD

## 2017-02-09 ENCOUNTER — Ambulatory Visit (HOSPITAL_COMMUNITY)
Admission: RE | Admit: 2017-02-09 | Discharge: 2017-02-09 | Disposition: A | Discharge status: Home / Self Care | Payer: BLUE CROSS/BLUE SHIELD | Source: Ambulatory Visit | Attending: Radiation Oncology | Admitting: Radiation Oncology

## 2017-02-09 DIAGNOSIS — M48061 Spinal stenosis, lumbar region without neurogenic claudication: Secondary | ICD-10-CM | POA: Diagnosis not present

## 2017-02-09 DIAGNOSIS — M5137 Other intervertebral disc degeneration, lumbosacral region: Secondary | ICD-10-CM | POA: Diagnosis not present

## 2017-02-09 DIAGNOSIS — M5126 Other intervertebral disc displacement, lumbar region: Secondary | ICD-10-CM | POA: Diagnosis not present

## 2017-02-09 DIAGNOSIS — C7951 Secondary malignant neoplasm of bone: Secondary | ICD-10-CM | POA: Diagnosis present

## 2017-02-09 DIAGNOSIS — Z510 Encounter for antineoplastic radiation therapy: Secondary | ICD-10-CM | POA: Diagnosis not present

## 2017-02-09 DIAGNOSIS — M4856XA Collapsed vertebra, not elsewhere classified, lumbar region, initial encounter for fracture: Secondary | ICD-10-CM | POA: Diagnosis not present

## 2017-02-09 DIAGNOSIS — R590 Localized enlarged lymph nodes: Secondary | ICD-10-CM | POA: Insufficient documentation

## 2017-02-09 MED ORDER — GADOBENATE DIMEGLUMINE 529 MG/ML IV SOLN
20.0000 mL | Freq: Once | INTRAVENOUS | Status: DC | PRN
Start: 2017-02-09 — End: 2017-02-10

## 2017-02-10 ENCOUNTER — Encounter: Payer: Self-pay | Admitting: Hematology & Oncology

## 2017-02-11 ENCOUNTER — Ambulatory Visit
Admission: RE | Admit: 2017-02-11 | Discharge: 2017-02-11 | Disposition: A | Discharge status: Home / Self Care | Payer: BLUE CROSS/BLUE SHIELD | Source: Ambulatory Visit | Attending: Radiation Oncology | Admitting: Radiation Oncology

## 2017-02-11 ENCOUNTER — Encounter: Payer: Self-pay | Admitting: Radiation Oncology

## 2017-02-11 VITALS — BP 139/90 | HR 58 | Temp 97.8°F | Resp 20

## 2017-02-11 DIAGNOSIS — Z923 Personal history of irradiation: Secondary | ICD-10-CM

## 2017-02-11 DIAGNOSIS — C7931 Secondary malignant neoplasm of brain: Secondary | ICD-10-CM

## 2017-02-11 DIAGNOSIS — Z510 Encounter for antineoplastic radiation therapy: Secondary | ICD-10-CM | POA: Diagnosis not present

## 2017-02-11 HISTORY — DX: Personal history of irradiation: Z92.3

## 2017-02-11 NOTE — Progress Notes (Signed)
  Radiation Oncology         [phone] ________________________________  Stereotactic Treatment Procedure Note  Name: Devynn Scheff MRN: [id number]  Date: 02/11/2017  DOB: [birth year]  SPECIAL TREATMENT PROCEDURE   ICD-9-CM ICD-10-CM   1. Brain metastases ([place]) 198.3 C79.31     3D TREATMENT PLANNING AND DOSIMETRY:  The patient's radiation plan was reviewed and approved by neurosurgery and radiation oncology prior to treatment.  It showed 3-dimensional radiation distributions overlaid onto the planning CT/MRI image set.  The [facility] for the target structures as well as the organs at risk were reviewed. The documentation of the 3D plan and dosimetry are filed in the radiation oncology EMR.  NARRATIVE:  Navdeep Halt was brought to the TrueBeam stereotactic radiation treatment machine and placed supine on the CT couch. The head frame was applied, and the patient was set up for stereotactic radiosurgery.  Neurosurgery was present for the set-up and delivery  SIMULATION VERIFICATION:  In the couch zero-angle position, the patient underwent Exactrac imaging using the Brainlab system with orthogonal KV images.  These were carefully aligned and repeated to confirm treatment position for each of the isocenters.  The Exactrac snap film verification was repeated at each couch angle.  SPECIAL TREATMENT PROCEDURE: [name] received stereotactic radiosurgery to the following targets: Right posterior frontal 58mm target was treated using 3 Dynamic Conformal Arcs to a prescription dose of 20 Gy.  ExacTrac Snap verification was performed for each couch angle.  Right anterior frontal 67mm target was treated using 3 Dynamic Conformal Arcs to a prescription dose of 20 Gy.  ExacTrac Snap verification was performed for each couch angle.  This constitutes a special treatment procedure due to the ablative dose delivered and the technical nature of treatment.  This highly technical modality of treatment ensures that the  ablative dose is centered on the patient's tumor while sparing normal tissues from excessive dose and risk of detrimental effects.  STEREOTACTIC TREATMENT MANAGEMENT:  Following delivery, the patient was transported to nursing in stable condition and monitored for possible acute effects.  Vital signs were recorded BP 139/90   Pulse (!) 58   Temp 97.8 F (36.6 C)   Resp 20   SpO2 99% Comment: room air. The patient tolerated treatment without significant acute effects, and was discharged to home in stable condition.    PLAN: Follow-up in a week for spine simulation. ________________________________   [name], MD   This document serves as a record of services personally performed by [name], MD. It was created on her behalf by Bethann Humble, a trained medical scribe. The creation of this record is based on the scribe's personal observations and the provider's statements to them. This document has been checked and approved by the attending provider.

## 2017-02-11 NOTE — Progress Notes (Signed)
  Radiation Oncology         [phone] ________________________________  Name: Alexander Fry MRN: [id number]  Date: 02/11/2017  DOB: 09-Oct-1961  End of Treatment Note  Diagnosis:   brain metastases, renal cell carcinoma     Indication for treatment:  palliative       Radiation treatment dates:   02-11-17  Site/dose/Beams/energy:   Right posterior frontal 27mm target was treated using 3 Dynamic Conformal Arcs to a prescription dose of 20 Gy.    Right anterior frontal 29mm target was treated using 3 Dynamic Conformal Arcs to a prescription dose of 20 Gy.    6FFF photons were used for the entire radiosurgical brain treatment  Narrative: The patient tolerated radiation treatment relatively well.      Plan: The patient has completed radiation treatment. The patient will return to radiation oncology clinic spine simulation. I advised them to call or return sooner if they have any questions or concerns related to their recovery or treatment.  -----------------------------------  [name], MD

## 2017-02-11 NOTE — Op Note (Signed)
  Name: Alexander Fry  MRN: [id number]  Date: 02/11/2017   DOB: 1961/07/05  Stereotactic Radiosurgery Operative Note  PRE-OPERATIVE DIAGNOSIS:  Multiple Brain Metastases  POST-OPERATIVE DIAGNOSIS:  Multiple Brain Metastases  PROCEDURE:  Stereotactic Radiosurgery  SURGEON:  Peggyann Shoals, MD  NARRATIVE: The patient underwent a radiation treatment planning session in the radiation oncology simulation suite under the care of the radiation oncology physician and physicist.  I participated closely in the radiation treatment planning afterwards. The patient underwent planning CT which was fused to 3T high resolution MRI with 1 mm axial slices.  These images were fused on the planning system.  We contoured the gross target volumes and subsequently expanded this to yield the Planning Target Volume. I actively participated in the planning process.  I helped to define and review the target contours and also the contours of the optic pathway, eyes, brainstem and selected nearby organs at risk.  All the dose constraints for critical structures were reviewed and compared to AAPM Task Group 101.  The prescription dose conformity was reviewed.  I approved the plan electronically.    Accordingly, [name] was brought to the TrueBeam stereotactic radiation treatment linac and placed in the custom immobilization mask.  The patient was aligned according to the IR fiducial markers with BrainLab Exactrac, then orthogonal x-rays were used in ExacTrac with the 6DOF robotic table and the shifts were made to align the patient  [name] received stereotactic radiosurgery uneventfully.    Lesions treated:  2   Complex lesions treated:  0 (>3.5 cm, <23mm of optic path, or within the brainstem)   The detailed description of the procedure is recorded in the radiation oncology procedure note.  I was present for the duration of the procedure.  DISPOSITION:  Following delivery, the patient was transported to nursing in stable  condition and monitored for possible acute effects to be discharged to home in stable condition with follow-up in one month.  Peggyann Shoals, MD 02/11/2017 3:04 PM

## 2017-02-14 ENCOUNTER — Other Ambulatory Visit: Payer: Self-pay | Admitting: Radiation Therapy

## 2017-02-14 DIAGNOSIS — C7951 Secondary malignant neoplasm of bone: Secondary | ICD-10-CM

## 2017-02-15 ENCOUNTER — Other Ambulatory Visit (HOSPITAL_BASED_OUTPATIENT_CLINIC_OR_DEPARTMENT_OTHER): Payer: BLUE CROSS/BLUE SHIELD

## 2017-02-15 ENCOUNTER — Other Ambulatory Visit: Payer: Self-pay | Admitting: Family

## 2017-02-15 DIAGNOSIS — C641 Malignant neoplasm of right kidney, except renal pelvis: Secondary | ICD-10-CM

## 2017-02-15 LAB — CMP (CANCER CENTER ONLY)
ALT(SGPT): 88 U/L — ABNORMAL HIGH (ref 10–47)
AST: 45 U/L — ABNORMAL HIGH (ref 11–38)
Albumin: 3.5 g/dL (ref 3.3–5.5)
Alkaline Phosphatase: 114 U/L — ABNORMAL HIGH (ref 26–84)
BUN: 18 mg/dL (ref 7–22)
CHLORIDE: 99 meq/L (ref 98–108)
CO2: 28 meq/L (ref 18–33)
CREATININE: 0.6 mg/dL (ref 0.6–1.2)
Calcium: 9.2 mg/dL (ref 8.0–10.3)
GLUCOSE: 130 mg/dL — AB (ref 73–118)
POTASSIUM: 4 meq/L (ref 3.3–4.7)
Sodium: 141 mEq/L (ref 128–145)
TOTAL PROTEIN: 7.6 g/dL (ref 6.4–8.1)
Total Bilirubin: 0.8 mg/dl (ref 0.20–1.60)

## 2017-02-15 LAB — CBC WITH DIFFERENTIAL (CANCER CENTER ONLY)
BASO#: 0 10*3/uL (ref 0.0–0.2)
BASO%: 0.1 % (ref 0.0–2.0)
EOS ABS: 0 10*3/uL (ref 0.0–0.5)
EOS%: 0 % (ref 0.0–7.0)
HCT: 45.9 % (ref 38.7–49.9)
HGB: 15.5 g/dL (ref 13.0–17.1)
LYMPH#: 1.7 10*3/uL (ref 0.9–3.3)
LYMPH%: 12.6 % — AB (ref 14.0–48.0)
MCH: 26.2 pg — AB (ref 28.0–33.4)
MCHC: 33.8 g/dL (ref 32.0–35.9)
MCV: 78 fL — ABNORMAL LOW (ref 82–98)
MONO#: 0.4 10*3/uL (ref 0.1–0.9)
MONO%: 3 % (ref 0.0–13.0)
NEUT#: 11.4 10*3/uL — ABNORMAL HIGH (ref 1.5–6.5)
NEUT%: 84.3 % — AB (ref 40.0–80.0)
PLATELETS: 340 10*3/uL (ref 145–400)
RBC: 5.92 10*6/uL — AB (ref 4.20–5.70)
RDW: 19.4 % — AB (ref 11.1–15.7)
WBC: 13.6 10*3/uL — AB (ref 4.0–10.0)

## 2017-02-15 LAB — LACTATE DEHYDROGENASE: LDH: 238 U/L (ref 125–245)

## 2017-02-17 ENCOUNTER — Ambulatory Visit (INDEPENDENT_AMBULATORY_CARE_PROVIDER_SITE_OTHER): Payer: BLUE CROSS/BLUE SHIELD | Admitting: Physician Assistant

## 2017-02-17 VITALS — BP 153/91 | HR 76 | Wt 224.0 lb

## 2017-02-17 DIAGNOSIS — L84 Corns and callosities: Secondary | ICD-10-CM | POA: Diagnosis not present

## 2017-02-17 DIAGNOSIS — B351 Tinea unguium: Secondary | ICD-10-CM

## 2017-02-17 DIAGNOSIS — Z1389 Encounter for screening for other disorder: Secondary | ICD-10-CM | POA: Diagnosis not present

## 2017-02-17 DIAGNOSIS — Z1331 Encounter for screening for depression: Secondary | ICD-10-CM

## 2017-02-17 MED ORDER — CICLOPIROX 8 % EX KIT: PACK | CUTANEOUS | 2 refills | Status: DC

## 2017-02-17 NOTE — Patient Instructions (Addendum)
Apply lacquer to left great toe daily. Remove every 7 days with alcohol  Keep right 4th toe covered with band aid to prevent rubbing Return in 2 weeks for repeat cryotherapy treatment  Corns and Calluses Corns are small areas of thickened skin that occur on the top, sides, or tip of a toe. They contain a cone-shaped core with a point that can press on a nerve below. This causes pain. Calluses are areas of thickened skin that can occur anywhere on the body including hands, fingers, palms, soles of the feet, and heels.Calluses are usually larger than corns. What are the causes? Corns and calluses are caused by rubbing (friction) or pressure, such as from shoes that are too tight or do not fit properly. What increases the risk? Corns are more likely to develop in people who have toe deformities, such as hammer toes. Since calluses can occur with friction to any area of the skin, calluses are more likely to develop in people who:  Work with their hands.  Wear shoes that fit poorly, shoes that are too tight, or shoes that are high-heeled.  Have toes deformities. What are the signs or symptoms? Symptoms of a corn or callus include:  A hard growth on the skin.  Pain or tenderness under the skin.  Redness and swelling.  Increased discomfort while wearing tight-fitting shoes. How is this diagnosed? Corns and calluses may be diagnosed with a medical history and physical exam. How is this treated? Corns and calluses may be treated with:  Removing the cause of the friction or pressure. This may include:  Changing your shoes.  Wearing shoe inserts (orthotics) or other protective layers in your shoes, such as a corn pad.  Wearing gloves.  Medicines to help soften skin in the hardened, thickened areas.  Reducing the size of the corn or callus by removing the dead layers of skin.  Antibiotic medicines to treat infection.  Surgery, if a toe deformity is the cause. Follow these  instructions at home:  Take medicines only as directed by your health care provider.  If you were prescribed an antibiotic, finish all of it even if you start to feel better.  Wear shoes that fit well. Avoid wearing high-heeled shoes and shoes that are too tight or too loose.  Wear any padding, protective layers, gloves, or orthotics as directed by your health care provider.  Soak your hands or feet and then use a file or pumice stone to soften your corn or callus. Do this as directed by your health care provider.  Check your corn or callus every day for signs of infection. Watch for:  Redness, swelling, or pain.  Fluid, blood, or pus. Contact a health care provider if:  Your symptoms do not improve with treatment.  You have increased redness, swelling, or pain at the site of your corn or callus.  You have fluid, blood, or pus coming from your corn or callus.  You have new symptoms. This information is not intended to replace advice given to you by your health care provider. Make sure you discuss any questions you have with your health care provider. Document Released: 08/28/2004 Document Revised: 06/11/2016 Document Reviewed: 11/18/2014 Elsevier Interactive Patient Education  2017 [place].   Fungal Nail Infection Fungal nail infection is a common fungal infection of the toenails or fingernails. This condition affects toenails more often than fingernails. More than one nail may be infected. The condition can be passed from person to person (is contagious). What  are the causes? This condition is caused by a fungus. Several types of funguses can cause the infection. These funguses are common in moist and warm areas. If your hands or feet come into contact with the fungus, it may get into a crack in your fingernail or toenail and cause the infection. What increases the risk? The following factors may make you more likely to develop this condition:  Being male.  Having  diabetes.  Being of older age.  Living with someone who has the fungus.  Walking barefoot in areas where the fungus thrives, such as showers or locker rooms.  Having poor circulation.  Wearing shoes and socks that cause your feet to sweat.  Having athlete's foot.  Having a nail injury or history of a recent nail surgery.  Having psoriasis.  Having a weak body defense system (immune system). What are the signs or symptoms? Symptoms of this condition include:  A pale spot on the nail.  Thickening of the nail.  A nail that becomes yellow or brown.  A brittle or ragged nail edge.  A crumbling nail.  A nail that has lifted away from the nail bed. How is this diagnosed? This condition is diagnosed with a physical exam. Your health care provider may take a scraping or clipping from your nail to test for the fungus. How is this treated? Mild infections do not need treatment. If you have significant nail changes, treatment may include:  Oral antifungal medicines. You may need to take the medicine for several weeks or several months, and you may not see the results for a long time. These medicines can cause side effects. Ask your health care provider what problems to watch for.  Antifungal nail polish and nail cream. These may be used along with oral antifungal medicines.  Laser treatment of the nail.  Surgery to remove the nail. This may be needed for the most severe infections. Treatment takes a long time, and the infection may come back. Follow these instructions at home: Medicines   Take or apply over-the-counter and prescription medicines only as told by your health care provider.  Ask your health care provider about using over-the-counter mentholated ointment on your nails. Lifestyle    Do not share personal items, such as towels or nail clippers.  Trim your nails often.  Wash and dry your hands and feet every day.  Wear absorbent socks, and change your socks  frequently.  Wear shoes that allow air to circulate, such as sandals or canvas tennis shoes. Throw out old shoes.  Wear rubber gloves if you are working with your hands in wet areas.  Do not walk barefoot in shower rooms or locker rooms.  Do not use a nail salon that does not use clean instruments.  Do not use artificial nails. General instructions   Keep all follow-up visits as told by your health care provider. This is important.  Use antifungal foot powder on your feet and in your shoes. Contact a health care provider if: Your infection is not getting better or it is getting worse after several months. This information is not intended to replace advice given to you by your health care provider. Make sure you discuss any questions you have with your health care provider. Document Released: 11/19/2000 Document Revised: 04/29/2016 Document Reviewed: 05/26/2015 Elsevier Interactive Patient Education  2017 [employer].

## 2017-02-18 ENCOUNTER — Encounter: Payer: Self-pay | Admitting: Radiation Oncology

## 2017-02-18 DIAGNOSIS — Z510 Encounter for antineoplastic radiation therapy: Secondary | ICD-10-CM | POA: Diagnosis not present

## 2017-02-20 DIAGNOSIS — R740 Nonspecific elevation of levels of transaminase and lactic acid dehydrogenase [LDH]: Secondary | ICD-10-CM

## 2017-02-20 DIAGNOSIS — R7401 Elevation of levels of liver transaminase levels: Secondary | ICD-10-CM | POA: Insufficient documentation

## 2017-02-20 NOTE — Progress Notes (Signed)
HPI:                                                                Alexander Fry is a 56 y.o. male who presents to [facility] today for plantar's wart and toenail fungus  Patient with PMHx of metastatic right renal cell carcinoma (2018, active) reports a plantar's wart on his right fourth toe that has been present for at least 1 year. It is nontender, nonpruritic, and does not bleed or drain. Patient also reports persistent fungal infection under his left great toenail for which he states he has taken oral antifungals in the past. Denies fever, chills, or pain.   Past Medical History:  Diagnosis Date  . Goals of care, counseling/discussion 12/22/2016  . Inguinal hernia of left side without obstruction or gangrene   . Pneumonia    left lung  . Renal cell carcinoma, right ([place]) 01/05/2017  . 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 His vision has "spots" at times   Past Surgical History:  Procedure Laterality Date  . HERNIA REPAIR    . INGUINAL HERNIA REPAIR Left    Social History  Substance Use Topics  . Smoking status: Never Smoker  . Smokeless tobacco: Never Used  . Alcohol use Yes     Comment: social use in the past   family history includes Alcohol abuse in his brother; Diabetes in his mother; Heart disease in his father and mother.  ROS: negative except as noted in the HPI  Medications: Current Outpatient Prescriptions  Medication Sig Dispense Refill  . pazopanib (VOTRIENT) 200 MG tablet Take 4 tablets (800 mg total) by mouth daily. Take on an empty stomach. 120 tablet 2  . tamsulosin (FLOMAX) 0.4 MG CAPS capsule Take 1 capsule (0.4 mg total) by mouth daily. 30 capsule 3  . Ciclopirox 8 % KIT Apply to affected nail daily. Remove with alcohol every 7 days. 1 each 2   No current facility-administered medications for this visit.    No Known Allergies     Objective:  BP (!) 153/91   Pulse 76   Wt 224 lb  (101.6 kg)   BMI 30.38 kg/m  Gen: well-groomed, cooperative, not ill-appearing, no distress Skin: warm, dry, intact; thickened hyperkeratotic 0.2cm lesion on the lateral aspect of the right 4th toe; left great toe with thickening and yellowing of the distal nail plate Psych: good eye contact, appropriate affect, euthymic mood, normal speech and thought content  Depression screen [facility] 2/9 02/20/2017 01/28/2017  Decreased Interest 0 0  Down, Depressed, Hopeless 0 0  PHQ - 2 Score 0 0    Assessment and Plan: 56 y.o. male with  1. Onychomycosis of left great toe - avoiding oral agents given patient is currently undergoing chemotherapy and radiation treatments and do not want to cause any leukopenia - Ciclopirox 8 % KIT; Apply to affected nail daily. Remove with alcohol every 7 days.  Dispense: 1 each; Refill: 2  2. Corn of foot Procedure: Cryodestruction of: corn on lateral aspect of right 4th toe Consent obtained and verified. Time-out conducted. Noted no overlying erythema, induration, or other signs of local infection. Completed without difficulty using Cryo-Gun. Advised to call if  fevers/chills, erythema, induration, drainage, or persistent bleeding.  3. Screening for depression - patient is coping well with his diagnosis. He has good family supports locally and at home in [place]. He denies symptoms of depression or anxiety today.   Patient education and anticipatory guidance given Patient agrees with treatment plan Follow-up in 2 weeks for repeat cryotherapy if needed   Darlyne Russian PA-C

## 2017-02-21 ENCOUNTER — Ambulatory Visit
Admission: RE | Admit: 2017-02-21 | Discharge: 2017-02-21 | Disposition: A | Discharge status: Home / Self Care | Payer: BLUE CROSS/BLUE SHIELD | Source: Ambulatory Visit | Attending: Radiation Oncology | Admitting: Radiation Oncology

## 2017-02-21 ENCOUNTER — Encounter: Payer: Self-pay | Admitting: Radiation Oncology

## 2017-02-21 VITALS — BP 158/94 | HR 97 | Temp 97.6°F

## 2017-02-21 DIAGNOSIS — C7951 Secondary malignant neoplasm of bone: Secondary | ICD-10-CM

## 2017-02-21 DIAGNOSIS — C7931 Secondary malignant neoplasm of brain: Secondary | ICD-10-CM

## 2017-02-21 DIAGNOSIS — Z510 Encounter for antineoplastic radiation therapy: Secondary | ICD-10-CM | POA: Diagnosis not present

## 2017-02-21 MED ORDER — GADOBENATE DIMEGLUMINE 529 MG/ML IV SOLN
20.0000 mL | Freq: Once | INTRAVENOUS | Status: AC | PRN
  Administered 2017-02-21: 20 mL via INTRAVENOUS

## 2017-02-21 NOTE — Addendum Note (Signed)
Encounter addended by: [name], MD on: 02/21/2017  6:09 PM<BR>    Actions taken: Visit diagnoses modified, Sign clinical note

## 2017-02-21 NOTE — Progress Notes (Addendum)
.    Radiation Oncology         (336) [phone] ________________________________  Stereotactic Treatment Procedure Note  Name: Alexander Fry MRN: [id number]  Date: 02/21/2017  DOB: 1961/02/05  SPECIAL TREATMENT PROCEDURE   ICD-9-CM ICD-10-CM   1. Brain metastases ([place]) 198.3 C79.31     3D TREATMENT PLANNING AND DOSIMETRY:  The patient's radiation plan was reviewed and approved by neurosurgery and radiation oncology prior to treatment.  It showed 3-dimensional radiation distributions overlaid onto the planning CT/MRI image set.  The [facility] for the target structures as well as the organs at risk were reviewed. The documentation of the 3D plan and dosimetry are filed in the radiation oncology EMR.  NARRATIVE:  Alexander Fry was brought to the TrueBeam stereotactic radiation treatment machine and placed supine on the CT couch. The head frame was applied, and the patient was set up for stereotactic radiosurgery.  Neurosurgery was present for the set-up and delivery  SIMULATION VERIFICATION:  In the couch zero-angle position, the patient underwent Exactrac imaging using the Brainlab system with orthogonal KV images.  These were carefully aligned and repeated to confirm treatment position for each of the isocenters.  The Exactrac snap film verification was repeated at each couch angle.  SPECIAL TREATMENT PROCEDURE: Alexander Fry received stereotactic radiosurgery to the following targets: Right Post Parietal 4 mm target was treated using 4 Dynamic Conformal Arcs to a prescription dose of 20 Gy.  ExacTrac Snap verification was performed for each couch angle.  Left Post Parietal 3 mm target was treated using 3 circular collimator arcs to a prescription dose of 20 Gy.  ExacTrac Snap verification was performed for each couch angle.  Right Occipital 4 mm target was treated using 3 Dynamic Conformal Arcs to a prescription dose of 20 Gy.  ExacTrac Snap verification was performed for each couch angle.  Left Occipital 4 mm  target was treated using 3 Dynamic Conformal Arcs to a prescription dose of 20 Gy.  ExacTrac Snap verification was performed for each couch angle.  This constitutes a special treatment procedure due to the ablative dose delivered and the technical nature of treatment.  This highly technical modality of treatment ensures that the ablative dose is centered on the patient's tumor while sparing normal tissues from excessive dose and risk of detrimental effects.  STEREOTACTIC TREATMENT MANAGEMENT:  Following delivery, the patient was transported to nursing in stable condition and monitored for possible acute effects.  Vital signs were recorded .  Vitals with BMI 02/21/2017  Height   Weight   BMI   Systolic 409  Diastolic 94  Pulse 97  Respirations   The patient tolerated treatment without significant acute effects.  PLAN: Simulation for spine today to follow. Follow-up in a week for spine radiosurgery ________________________________   [name], MD

## 2017-02-21 NOTE — Progress Notes (Addendum)
      Radiation Oncology         [phone] ________________________________  Name: Alexander Fry MRN: [id number]  Date: 02/21/2017  DOB: 09/13/1961  SIMULATION AND TREATMENT PLANNING NOTE Outpatient    ICD-9-CM ICD-10-CM   1. Bone metastases ([facility]) 198.5 C79.51     DIAGNOSIS:  As above  NARRATIVE:  The patient was brought to the [facility].  Identity was confirmed.  All relevant records and images related to the planned course of therapy were reviewed.  The patient freely provided informed written consent to proceed with treatment after reviewing the details related to the planned course of therapy. The consent form was witnessed and verified by the simulation staff.   Then, the patient was set-up in a stable reproducible supine position for radiation therapy.  A body stereotactic frame was fabricated for precise immobilization with vacuum suction.  CT images were obtained.  Surface markings were placed.  The CT images were loaded into the planning software and fused with the patient's targeting MRI scan.  Then the target and avoidance structures were contoured.  Treatment planning then occurred.  The radiation prescription was entered and confirmed.  I have requested 3D radiosurgical planning  With IMRT if necessary.  I have requested a DVH of the following structures: spinal cord, kidneys, bowel, target volumes, and normal tissue.    SPECIAL TREATMENT PROCEDURE:  The planned course of therapy using radiation constitutes a special treatment procedure. Special care is required in the management of this patient for the following reasons:  High dose per fraction requiring special monitoring for increased toxicities of treatment including daily imaging.  The special nature of the planned course of radiotherapy will require increased physician supervision and oversight to ensure patient's safety with optimal treatment outcomes.  PLAN:  The patient will receive 27 Gy in 3 fractions to  L1 and L4 of the spine.  ________________________________    [name], MD

## 2017-02-21 NOTE — Progress Notes (Signed)
Patient completed SRS brain CT simulation treatment.  Vital signs accessed.  Patient denies any pain, dizziness, or headache.  Alert & Oriented X 4  Vitals:   02/21/17 0946  BP: (!) 158/94  Pulse: 97  Temp: 97.6 F (36.4 C)  TempSrc: Oral  SpO2: 98%

## 2017-02-21 NOTE — Op Note (Signed)
  Name: Alexander Fry  MRN: [id number]  Date: 02/21/2017   DOB: 09/05/1961  Stereotactic Radiosurgery Operative Note  PRE-OPERATIVE DIAGNOSIS:  Multiple Brain Metastases  POST-OPERATIVE DIAGNOSIS:  Multiple Brain Metastases  PROCEDURE:  Stereotactic Radiosurgery  SURGEON:  Peggyann Shoals, MD  NARRATIVE: The patient underwent a radiation treatment planning session in the radiation oncology simulation suite under the care of the radiation oncology physician and physicist.  I participated closely in the radiation treatment planning afterwards. The patient underwent planning CT which was fused to 3T high resolution MRI with 1 mm axial slices.  These images were fused on the planning system.  We contoured the gross target volumes and subsequently expanded this to yield the Planning Target Volume. I actively participated in the planning process.  I helped to define and review the target contours and also the contours of the optic pathway, eyes, brainstem and selected nearby organs at risk.  All the dose constraints for critical structures were reviewed and compared to AAPM Task Group 101.  The prescription dose conformity was reviewed.  I approved the plan electronically.    Accordingly, [name] was brought to the TrueBeam stereotactic radiation treatment linac and placed in the custom immobilization mask.  The patient was aligned according to the IR fiducial markers with BrainLab Exactrac, then orthogonal x-rays were used in ExacTrac with the 6DOF robotic table and the shifts were made to align the patient  [name] received stereotactic radiosurgery uneventfully.    Lesions treated:  4   Complex lesions treated:  0 (>3.5 cm, <81mm of optic path, or within the brainstem)   The detailed description of the procedure is recorded in the radiation oncology procedure note.  I was present for the duration of the procedure.  DISPOSITION:  Following delivery, the patient was transported to nursing in stable  condition and monitored for possible acute effects to be discharged to home in stable condition with follow-up in one month.  Peggyann Shoals, MD 02/21/2017 8:44 AM

## 2017-02-22 DIAGNOSIS — Z510 Encounter for antineoplastic radiation therapy: Secondary | ICD-10-CM | POA: Diagnosis not present

## 2017-02-23 MED FILL — DEXAMETHASONE 4 MG TABLET: 4 | 15 days supply | Qty: 60 | Fill #1

## 2017-02-24 DIAGNOSIS — Z510 Encounter for antineoplastic radiation therapy: Secondary | ICD-10-CM | POA: Diagnosis not present

## 2017-02-25 ENCOUNTER — Ambulatory Visit
Admission: RE | Admit: 2017-02-25 | Discharge: 2017-02-25 | Disposition: A | Discharge status: Home / Self Care | Payer: BLUE CROSS/BLUE SHIELD | Source: Ambulatory Visit | Attending: Radiation Oncology | Admitting: Radiation Oncology

## 2017-02-25 ENCOUNTER — Other Ambulatory Visit: Payer: Self-pay | Admitting: *Deleted

## 2017-02-25 VITALS — BP 156/100 | HR 64 | Temp 98.5°F | Resp 18

## 2017-02-25 DIAGNOSIS — Z510 Encounter for antineoplastic radiation therapy: Secondary | ICD-10-CM | POA: Diagnosis not present

## 2017-02-25 DIAGNOSIS — C7951 Secondary malignant neoplasm of bone: Secondary | ICD-10-CM

## 2017-02-25 DIAGNOSIS — C641 Malignant neoplasm of right kidney, except renal pelvis: Secondary | ICD-10-CM

## 2017-02-25 NOTE — Progress Notes (Addendum)
  Radiation Oncology         [phone]) [phone] ________________________________  Stereotactic Treatment Procedure Note  outpatient  Name: Alexander Fry MRN: [id number]  Date: 02/25/2017  DOB: 1961-07-20   ICD-9-CM ICD-10-CM   1. Bone metastases ([place]) 198.5 C79.51     SPECIAL TREATMENT PROCEDURE  3D TREATMENT PLANNING AND DOSIMETRY:  The patient's radiation plan was reviewed and approved by neurosurgery and radiation oncology prior to treatment.  It showed 3-dimensional radiation distributions overlaid onto the planning CT/MRI image set.  The [facility] for the target structures as well as the organs at risk were reviewed. The documentation of the 3D plan and dosimetry are filed in the radiation oncology EMR.  NARRATIVE:  Alexander Fry was brought to the TrueBeam stereotactic radiation treatment machine and placed supine on the CT couch. The head frame was applied, and the patient was set up for stereotactic radiosurgery.  Neurosurgery was present for the set-up and delivery  SIMULATION VERIFICATION:  In the couch zero-angle position, the patient underwent Exactrac imaging using the Brainlab system with orthogonal KV images.  These were carefully aligned and repeated to confirm treatment position for each of the isocenters.  The Exactrac snap film verification was repeated at each couch angle.  SPECIAL TREATMENT PROCEDURE: Alexander Fry received stereotactic radiosurgery to the following targets: Comments: L1 Spine target was treated using 3 Rapid Arc VMAT Beams to a prescription dose of 9 Gy out of 27 Gy.  ExacTrac Snap verification was performed for each couch angle. Comments: L4 Spine target was treated using 3 Rapid Arc VMAT Beams to a prescription dose of 9 Gy out of 27 Gy.  ExacTrac Snap verification was performed for each couch angle.   This constitutes a special treatment procedure due to the ablative dose delivered and the technical nature of treatment.  This highly technical modality of treatment ensures  that the ablative dose is centered on the patient's tumor while sparing normal tissues from excessive dose and risk of detrimental effects.  STEREOTACTIC TREATMENT MANAGEMENT:  Following delivery, the patient was transported to nursing in stable condition and monitored for possible acute effects.  Vital signs were recorded BP (!) 156/100 (BP Location: Left Arm, Patient Position: Sitting, Cuff Size: Normal)   Pulse 64   Temp 98.5 F (36.9 C) (Oral)   Resp 18   SpO2 97% .  No oral thrush on exam today. The patient tolerated treatment without significant acute effects, and was discharged to home in stable condition.    PLAN: Follow-up in 3 days for next fraction.  On 03-09-17 taper Dexamethasone to 1 tablet daily. On 03-23-17 taper to 1/2 tablet daily. On 04-06-17 taper to 1/2 tablet every other day. On 04-20-17 take you last dose and then stop. ________________________________   [name], MD

## 2017-02-28 ENCOUNTER — Other Ambulatory Visit (HOSPITAL_BASED_OUTPATIENT_CLINIC_OR_DEPARTMENT_OTHER): Payer: BLUE CROSS/BLUE SHIELD

## 2017-02-28 ENCOUNTER — Ambulatory Visit (HOSPITAL_BASED_OUTPATIENT_CLINIC_OR_DEPARTMENT_OTHER): Payer: BLUE CROSS/BLUE SHIELD | Admitting: Hematology & Oncology

## 2017-02-28 ENCOUNTER — Encounter: Payer: Self-pay | Admitting: Radiation Oncology

## 2017-02-28 ENCOUNTER — Ambulatory Visit (HOSPITAL_BASED_OUTPATIENT_CLINIC_OR_DEPARTMENT_OTHER): Payer: BLUE CROSS/BLUE SHIELD

## 2017-02-28 ENCOUNTER — Ambulatory Visit
Admission: RE | Admit: 2017-02-28 | Discharge: 2017-02-28 | Disposition: A | Discharge status: Home / Self Care | Payer: BLUE CROSS/BLUE SHIELD | Source: Ambulatory Visit | Attending: Radiation Oncology | Admitting: Radiation Oncology

## 2017-02-28 VITALS — BP 144/93 | HR 76 | Temp 98.1°F | Resp 19 | Wt 220.1 lb

## 2017-02-28 VITALS — BP 125/99 | HR 78 | Temp 98.6°F | Resp 20

## 2017-02-28 DIAGNOSIS — C7951 Secondary malignant neoplasm of bone: Secondary | ICD-10-CM

## 2017-02-28 DIAGNOSIS — C641 Malignant neoplasm of right kidney, except renal pelvis: Secondary | ICD-10-CM

## 2017-02-28 DIAGNOSIS — C7800 Secondary malignant neoplasm of unspecified lung: Secondary | ICD-10-CM | POA: Diagnosis not present

## 2017-02-28 DIAGNOSIS — Z510 Encounter for antineoplastic radiation therapy: Secondary | ICD-10-CM | POA: Diagnosis not present

## 2017-02-28 DIAGNOSIS — C787 Secondary malignant neoplasm of liver and intrahepatic bile duct: Secondary | ICD-10-CM | POA: Diagnosis not present

## 2017-02-28 LAB — CBC WITH DIFFERENTIAL (CANCER CENTER ONLY)
BASO#: 0 10*3/uL (ref 0.0–0.2)
BASO%: 0.2 % (ref 0.0–2.0)
EOS%: 0 % (ref 0.0–7.0)
Eosinophils Absolute: 0 10*3/uL (ref 0.0–0.5)
HCT: 45.2 % (ref 38.7–49.9)
HGB: 15.4 g/dL (ref 13.0–17.1)
LYMPH#: 1 10*3/uL (ref 0.9–3.3)
LYMPH%: 9.3 % — ABNORMAL LOW (ref 14.0–48.0)
MCH: 27 pg — ABNORMAL LOW (ref 28.0–33.4)
MCHC: 34.1 g/dL (ref 32.0–35.9)
MCV: 79 fL — ABNORMAL LOW (ref 82–98)
MONO#: 0.3 10*3/uL (ref 0.1–0.9)
MONO%: 3.2 % (ref 0.0–13.0)
NEUT#: 9.2 10*3/uL — ABNORMAL HIGH (ref 1.5–6.5)
NEUT%: 87.3 % — AB (ref 40.0–80.0)
Platelets: 140 10*3/uL — ABNORMAL LOW (ref 145–400)
RBC: 5.7 10*6/uL (ref 4.20–5.70)
RDW: 21.1 % — AB (ref 11.1–15.7)
WBC: 10.6 10*3/uL — ABNORMAL HIGH (ref 4.0–10.0)

## 2017-02-28 LAB — CMP (CANCER CENTER ONLY)
ALT(SGPT): 62 U/L — ABNORMAL HIGH (ref 10–47)
AST: 37 U/L (ref 11–38)
Albumin: 3 g/dL — ABNORMAL LOW (ref 3.3–5.5)
Alkaline Phosphatase: 113 U/L — ABNORMAL HIGH (ref 26–84)
BUN, Bld: 14 mg/dL (ref 7–22)
CO2: 29 meq/L (ref 18–33)
Calcium: 9.1 mg/dL (ref 8.0–10.3)
Chloride: 98 mEq/L (ref 98–108)
Creat: 0.8 mg/dl (ref 0.6–1.2)
GLUCOSE: 135 mg/dL — AB (ref 73–118)
POTASSIUM: 4.4 meq/L (ref 3.3–4.7)
Sodium: 140 mEq/L (ref 128–145)
Total Bilirubin: 0.9 mg/dl (ref 0.20–1.60)
Total Protein: 6.1 g/dL — ABNORMAL LOW (ref 6.4–8.1)

## 2017-02-28 MED ORDER — DENOSUMAB 120 MG/1.7ML ~~LOC~~ SOLN
120.0000 mg | Freq: Once | SUBCUTANEOUS | Status: AC
  Administered 2017-02-28: 120 mg via SUBCUTANEOUS
  Filled 2017-02-28: qty 1.7

## 2017-02-28 NOTE — Progress Notes (Signed)
  Radiation Oncology         [phone]) [phone] ________________________________  Stereotactic Treatment Procedure Note outpatient  Name: Alexander Fry                       MRN: [id number]         Date: 02/28/2017                      DOB: [birth year]    ICD-9-CM ICD-10-CM   1. Bone metastases ([place]) 198.5 C79.51     SPECIAL TREATMENT PROCEDURE  3D TREATMENT PLANNING AND DOSIMETRY:  The patient's radiation plan was reviewed and approved by neurosurgery and radiation oncology prior to treatment.  It showed 3-dimensional radiation distributions overlaid onto the planning CT/MRI image set.  The [facility] for the target structures as well as the organs at risk were reviewed. The documentation of the 3D plan and dosimetry are filed in the radiation oncology EMR.  NARRATIVE:  Alexander Fry was brought to the TrueBeam stereotactic radiation treatment machine and placed supine on the CT couch. The head frame was applied, and the patient was set up for stereotactic radiosurgery.  Neurosurgery was present for the set-up and delivery  SIMULATION VERIFICATION:  In the couch zero-angle position, the patient underwent Exactrac imaging using the Brainlab system with orthogonal KV images.  These were carefully aligned and repeated to confirm treatment position for each of the isocenters.  The Exactrac snap film verification was repeated at each couch angle.  SPECIAL TREATMENT PROCEDURE: Alexander Fry received stereotactic radiosurgery to the following targets: Comments: L1 Spine target was treated using 3 Rapid Arc VMAT Beams to a prescription dose of 9 Gy out of 27 Gy.  ExacTrac Snap verification was performed for each couch angle. Comments: L4 Spine target was treated using 3 Rapid Arc VMAT Beams to a prescription dose of 9 Gy out of 27 Gy.  ExacTrac Snap verification was performed for each couch angle.   This constitutes a special treatment procedure due to the ablative dose delivered and the technical nature of  treatment.  This highly technical modality of treatment ensures that the ablative dose is centered on the patient's tumor while sparing normal tissues from excessive dose and risk of detrimental effects.  STEREOTACTIC TREATMENT MANAGEMENT:  Following delivery, the patient was transported to nursing in stable condition and monitored for possible acute effects.  Vital signs were recorded  Vitals:   02/28/17 1315  BP: (!) 125/99  Pulse: 78  Resp: 20  Temp: 98.6 F (37 C)   .  No oral thrush on exam today. The patient tolerated treatment without significant acute effects, and was discharged to home in stable condition.    PLAN: Follow-up in later this week for next fraction of radiosurgery.   ------------------------------------------------  Jodelle Gross, MD, PhD

## 2017-02-28 NOTE — Progress Notes (Signed)
Patient ambulated to room 12, steady gait,, completed SRS 2/3 Spine, L1& L4, tolerated well, monitor for 15 minutes, will return this Wednesday for final SRS tx, no nausea, no pain, does have stated left temporal pressure point at times, none at present, no pain, patient  will call for any unusual symptoms, profuse vomiting, fever >100.5,  1:22 PM Gaspar Garbe, RN II Rad/Onc

## 2017-02-28 NOTE — Patient Instructions (Signed)

## 2017-02-28 NOTE — Progress Notes (Signed)
Hematology and Oncology Follow Up Visit  Alexander Fry [id number] 12/05/1961 56 y.o. 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 He is doing quite well. He has done radiation therapy to the brain. He had SRS. He tolerated this well.  He now is getting radiation to the spine. I think he has 3 more treatments left.  He has had no problems with pain. His pain is doing better. I have him on some Decadron. The radicular pain down the right leg seems to be much less.  He's tolerated the Votrient well. He's had no diarrhea. He's had no cough. His liver function tests are improving.   He's had no diarrhea. He's had a little nausea. He says he has had a couple episodes of emesis.   He's had no leg swelling.  He's had no headache.  Overall, his performance status is ECOG 1.   Medications:  Current Outpatient Prescriptions:  .  dexamethasone (DECADRON) 4 MG tablet, Take 2 tablets by mouth daily., Disp: , Rfl:  .  pazopanib (VOTRIENT) 200 MG tablet, Take 4 tablets (800 mg total) by mouth daily. Take on an empty stomach., Disp: 120 tablet, Rfl: 2 .  tamsulosin (FLOMAX) 0.4 MG CAPS capsule, Take 1 capsule (0.4 mg total) by mouth daily., Disp: 30 capsule, Rfl: 3 No current facility-administered medications for this visit.   Facility-Administered Medications Ordered in Other Visits:  .  denosumab (XGEVA) injection 120 mg, 120 mg, Subcutaneous, Once, [name], MD  Allergies: No Known Allergies  Past Medical History, Surgical history, Social history, and Family History were reviewed and updated.  Review of Systems:  As above  Physical Exam:  weight is 220 lb 1.3 oz (99.8 kg). His oral temperature is 98.1  F (36.7 C). His blood pressure is 144/93 (abnormal) and his pulse is 76. His respiration is 19 and oxygen saturation is 97%.   Wt Readings from Last 3 Encounters:  02/28/17 220 lb 1.3 oz (99.8 kg)  02/17/17 224 lb (101.6 kg)  02/08/17 215 lb (97.5 kg)     Head and neck exam shows no ocular or oral lesions. He has no palpable cervical or supraclavicular lymph nodes. Lungs are clear bilaterally. Cardiac exam regular rate and rhythm with no murmurs, rubs or bruits. Abdomen is soft. He has good bowel sounds. There is no fluid wave. There is no guarding or rebound tenderness. There is no palpable liver or spleen tip. Axillary exam shows no bilateral axillary adenopathy. Back exam shows no tenderness over the spine, ribs or hips. Extremities shows no clubbing, cyanosis or edema. Neurological exam shows no focal neurological deficits. Skin exam shows no rashes, ecchymoses or petechia.  Lab Results  Component Value Date   WBC 10.6 (H) 02/28/2017   HGB 15.4 02/28/2017   HCT 45.2 02/28/2017   MCV 79 (L) 02/28/2017   PLT 140 (L) 02/28/2017     Chemistry      Component Value Date/Time   NA 140 02/28/2017 1415   NA 138 01/27/2017 1128   K 4.4 02/28/2017 1415   K 4.4 01/27/2017 1128   CL 98 02/28/2017 1415   CO2 29 02/28/2017 1415   CO2 26 01/27/2017 1128   BUN  14 02/28/2017 1415   BUN 10.1 01/27/2017 1128   CREATININE 0.8 02/28/2017 1415   CREATININE 0.7 01/27/2017 1128      Component Value Date/Time   CALCIUM 9.1 02/28/2017 1415   CALCIUM 9.7 01/27/2017 1128   ALKPHOS 113 (H) 02/28/2017 1415   ALKPHOS 123 01/27/2017 1128   AST 37 02/28/2017 1415   AST 21 01/27/2017 1128   ALT 62 (H) 02/28/2017 1415   ALT 18 01/27/2017 1128   BILITOT 0.90 02/28/2017 1415   BILITOT 0.36 01/27/2017 1128         Impression and Plan: Mr. Chuba is a 56 year old white male. He has widely metastatic renal cell carcinoma of the right kidney. Thankfully, this is clear-cell histology.  I think that  things are working well. I believe that the Votrient along with the radiation is helping him.  I will give him Xgeva today. I think this will definitely help.  We will plan to get him back in one month. He does not need to have scans done probably until May.   I am just faint for that he is more active and is able to do more.   I spent about 35 minutes with him today.   [name], MD 3/26/20184:25 PM

## 2017-02-28 NOTE — Op Note (Signed)
   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  I participated closely in the radiation treatment planning afterwards. The patient underwent planning CT myelogram which was fused to the MRI.  These images were fused on the planning system.  Radiation oncology contoured the gross target volume and subsequently expanded this to yield the Planning Target Volume. I actively participated in the planning process.  I helped to define and review the target contours and also the contours of the spinal cord, and selected nearby organs at risk.  All the dose constraints for critical structures were reviewed and compared to AAPM Task Group 101.  The prescription dose conformity was reviewed.  I approved the plan electronically.    Accordingly, [name] was brought to the TrueBeam stereotactic radiation treatment linac and placed in the custom immobilization device.  The patient was aligned according to the IR fiducial markers with BrainLab Exactrac, then orthogonal x-rays were used in ExacTrac with the 6DOF robotic table and the shifts were made to align the patient.  Then conebeam CT was performed to verify precision.  [name] received stereotactic radiosurgery uneventfully.  The detailed description of the procedure is recorded in the radiation oncology procedure note.  I was present for the duration of the procedure.  DISPOSITION:  Following delivery, the patient was transported to nursing in stable condition and monitored for possible acute effects to  be discharged to home in stable condition with follow-up in one month.  Peggyann Shoals, MD 02/28/2017 12:54 PM

## 2017-03-02 ENCOUNTER — Encounter: Payer: Self-pay | Admitting: Radiation Oncology

## 2017-03-02 ENCOUNTER — Ambulatory Visit
Admission: RE | Admit: 2017-03-02 | Discharge: 2017-03-02 | Disposition: A | Discharge status: Home / Self Care | Payer: BLUE CROSS/BLUE SHIELD | Source: Ambulatory Visit | Attending: Radiation Oncology | Admitting: Radiation Oncology

## 2017-03-02 VITALS — BP 141/93 | HR 65 | Temp 98.4°F

## 2017-03-02 DIAGNOSIS — Z510 Encounter for antineoplastic radiation therapy: Secondary | ICD-10-CM | POA: Diagnosis not present

## 2017-03-02 DIAGNOSIS — C7951 Secondary malignant neoplasm of bone: Secondary | ICD-10-CM

## 2017-03-02 NOTE — Progress Notes (Addendum)
  Radiation Oncology         [id number]) [phone] ________________________________  Stereotactic Treatment Procedure Note  outpatient  Name: Dontrell Stuck MRN: [id number]  Date: 03/02/2017  DOB: [birth year]   ICD-9-CM ICD-10-CM   1. Bone metastases ([place]) 198.5 C79.51     SPECIAL TREATMENT PROCEDURE  3D TREATMENT PLANNING AND DOSIMETRY:  The patient's radiation plan was reviewed and approved by neurosurgery and radiation oncology prior to treatment.  It showed 3-dimensional radiation distributions overlaid onto the planning CT/MRI image set.  The [facility] for the target structures as well as the organs at risk were reviewed. The documentation of the 3D plan and dosimetry are filed in the radiation oncology EMR.  NARRATIVE:  Elizabeth Haff was brought to the TrueBeam stereotactic radiation treatment machine and placed supine on the CT couch. The head frame was applied, and the patient was set up for stereotactic radiosurgery.  Neurosurgery was present for the set-up and delivery  SIMULATION VERIFICATION:  In the couch zero-angle position, the patient underwent Exactrac imaging using the Brainlab system with orthogonal KV images.  These were carefully aligned and repeated to confirm treatment position for each of the isocenters.  The Exactrac snap film verification was repeated at each couch angle.  SPECIAL TREATMENT PROCEDURE: [name] received stereotactic radiosurgery to the following targets: Comments: L1 Spine target was treated using 3 Rapid Arc VMAT Beams to a prescription dose of 9 Gy out of 27 Gy.  ExacTrac Snap verification was performed for each couch angle. Comments: L4 Spine target was treated using 3 Rapid Arc VMAT Beams to a prescription dose of 9 Gy out of 27 Gy.  ExacTrac Snap verification was performed for each couch angle.   This constitutes a special treatment procedure due to the ablative dose delivered and the technical nature of treatment.  This highly technical modality of treatment ensures  that the ablative dose is centered on the patient's tumor while sparing normal tissues from excessive dose and risk of detrimental effects.  STEREOTACTIC TREATMENT MANAGEMENT:  Following delivery, the patient was transported to nursing in stable condition and monitored for possible acute effects.  Vital signs were recorded BP (!) 141/93   Pulse 65   Temp 98.4 F (36.9 C)   SpO2 97% Comment: room air.  No oral thrush on exam today. The patient tolerated treatment without significant acute effects, and was discharged to home in stable condition.    PLAN: Follow-up in one month.  I asked for him to be presented at urology tumor board to discuss if nephrectomy is warranted in future (ie good response to systemic therapy).  Taper steroids --- On 03-09-17 taper Dexamethasone to 1 tablet daily. On 03-23-17 taper to 1/2 tablet daily. On 04-06-17 taper to 1/2 tablet every other day. On 04-20-17 take you last dose and then stop. ________________________________   [name], MD

## 2017-03-02 NOTE — Progress Notes (Signed)
[facility] End of Treatment Note  Name:Alexander Fry  Date: 03/02/2017 WAQ:[id number] DOB:August 01, 1961    DIAGNOSIS:   1. 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 Comments: L1 Spine target was treated using 3 Rapid Arc VMAT Beams to a prescription dose of  27 Gy in 3 fractions.    Comments: L4 Spine target was treated using 3 Rapid Arc VMAT Beams to a prescription dose of  of 27 Gy in 3 fractions.             NARRATIVE:   He tolerated treatment well.                         PLAN: Routine followup in one month. Patient instructed to call if questions or worsening complaints in interim.  -----------------------------------  [name], MD

## 2017-03-04 NOTE — Progress Notes (Signed)
  Radiation Oncology         [phone] ________________________________  Name: Gonsalo Cuthbertson MRN: [id number]  Date: 02/21/2017  DOB: [birth year]  End of Treatment Note  Diagnosis:   brain metastases, renal cell carcinoma     Indication for treatment:  palliative       Radiation treatment dates:   02-21-17  Site/dose/Beams/energy:   Right Post Parietal 4 mm target was treated using 4 Dynamic Conformal Arcs to a prescription dose of 20 Gy.    Left Post Parietal 3 mm target was treated using 3 circular collimator arcs to a prescription dose of 20 Gy.   Right Occipital 4 mm target was treated using 3 Dynamic Conformal Arcs to a prescription dose of 20 Gy.   Left Occipital 4 mm target was treated using 3 Dynamic Conformal Arcs to a prescription dose of 20 Gy.     6FFF photons were used for the entire radiosurgical brain treatment  Narrative: The patient tolerated radiation treatment relatively well.      Plan: The patient has completed radiation treatment. The patient will return to radiation oncology clinic for  spine treatment. I advised them to call or return sooner if they have any questions or concerns related to their recovery or treatment.  -----------------------------------  [name], MD

## 2017-03-17 MED FILL — VOTRIENT 200 MG TABLET: 200 | 30 days supply | Qty: 120 | Fill #1

## 2017-03-25 ENCOUNTER — Encounter: Payer: Self-pay | Admitting: Hematology & Oncology

## 2017-03-31 ENCOUNTER — Ambulatory Visit (HOSPITAL_BASED_OUTPATIENT_CLINIC_OR_DEPARTMENT_OTHER): Payer: BLUE CROSS/BLUE SHIELD | Admitting: Hematology & Oncology

## 2017-03-31 ENCOUNTER — Other Ambulatory Visit (HOSPITAL_BASED_OUTPATIENT_CLINIC_OR_DEPARTMENT_OTHER): Payer: BLUE CROSS/BLUE SHIELD

## 2017-03-31 ENCOUNTER — Ambulatory Visit (HOSPITAL_BASED_OUTPATIENT_CLINIC_OR_DEPARTMENT_OTHER): Payer: BLUE CROSS/BLUE SHIELD

## 2017-03-31 VITALS — BP 153/91 | HR 67 | Temp 98.7°F | Resp 19 | Wt 234.8 lb

## 2017-03-31 DIAGNOSIS — C7951 Secondary malignant neoplasm of bone: Secondary | ICD-10-CM | POA: Diagnosis not present

## 2017-03-31 DIAGNOSIS — C787 Secondary malignant neoplasm of liver and intrahepatic bile duct: Secondary | ICD-10-CM

## 2017-03-31 DIAGNOSIS — C641 Malignant neoplasm of right kidney, except renal pelvis: Secondary | ICD-10-CM

## 2017-03-31 DIAGNOSIS — C7800 Secondary malignant neoplasm of unspecified lung: Secondary | ICD-10-CM | POA: Diagnosis not present

## 2017-03-31 LAB — CMP (CANCER CENTER ONLY)
ALBUMIN: 3.2 g/dL — AB (ref 3.3–5.5)
ALT(SGPT): 100 U/L — ABNORMAL HIGH (ref 10–47)
AST: 55 U/L — AB (ref 11–38)
Alkaline Phosphatase: 98 U/L — ABNORMAL HIGH (ref 26–84)
BILIRUBIN TOTAL: 1 mg/dL (ref 0.20–1.60)
BUN: 11 mg/dL (ref 7–22)
CHLORIDE: 103 meq/L (ref 98–108)
CO2: 30 meq/L (ref 18–33)
CREATININE: 0.7 mg/dL (ref 0.6–1.2)
Calcium: 9.3 mg/dL (ref 8.0–10.3)
GLUCOSE: 100 mg/dL (ref 73–118)
Potassium: 4.1 mEq/L (ref 3.3–4.7)
SODIUM: 141 meq/L (ref 128–145)
Total Protein: 5.9 g/dL — ABNORMAL LOW (ref 6.4–8.1)

## 2017-03-31 LAB — CBC WITH DIFFERENTIAL (CANCER CENTER ONLY)
BASO#: 0 10*3/uL (ref 0.0–0.2)
BASO%: 0.3 % (ref 0.0–2.0)
EOS%: 0.3 % (ref 0.0–7.0)
Eosinophils Absolute: 0 10*3/uL (ref 0.0–0.5)
HCT: 41.2 % (ref 38.7–49.9)
HEMOGLOBIN: 14 g/dL (ref 13.0–17.1)
LYMPH#: 2.1 10*3/uL (ref 0.9–3.3)
LYMPH%: 33.1 % (ref 14.0–48.0)
MCH: 27.9 pg — ABNORMAL LOW (ref 28.0–33.4)
MCHC: 34 g/dL (ref 32.0–35.9)
MCV: 82 fL (ref 82–98)
MONO#: 0.3 10*3/uL (ref 0.1–0.9)
MONO%: 5.1 % (ref 0.0–13.0)
NEUT#: 3.9 10*3/uL (ref 1.5–6.5)
NEUT%: 61.2 % (ref 40.0–80.0)
PLATELETS: 152 10*3/uL (ref 145–400)
RBC: 5.01 10*6/uL (ref 4.20–5.70)
RDW: 23.6 % — ABNORMAL HIGH (ref 11.1–15.7)
WBC: 6.4 10*3/uL (ref 4.0–10.0)

## 2017-03-31 LAB — LACTATE DEHYDROGENASE: LDH: 375 U/L — AB (ref 125–245)

## 2017-03-31 LAB — TECHNOLOGIST REVIEW CHCC SATELLITE

## 2017-03-31 MED ORDER — DENOSUMAB 120 MG/1.7ML ~~LOC~~ SOLN
120.0000 mg | Freq: Once | SUBCUTANEOUS | Status: AC
  Administered 2017-03-31: 120 mg via SUBCUTANEOUS
  Filled 2017-03-31: qty 1.7

## 2017-03-31 NOTE — Patient Instructions (Signed)

## 2017-03-31 NOTE — Progress Notes (Signed)
Hematology and Oncology Follow Up Visit  Alexander Fry [id number] 1961/10/19 56 y.o. 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 He is doing quite well. He has done radiation therapy to the brain. He had SRS. He tolerated this well.  He completed radiation to the spine. He did have some side effects with this. He does feel more tired.  He is eating quite well. He has gained weight.  He has had no problems with pain. His pain is doing better. The radicular pain down the right leg seems to be much less.  He's tolerated the Votrient well. He's had no diarrhea. He's had no cough. His liver function tests are improving.   He's had no diarrhea. He's had a little nausea. He says he has had a couple episodes of emesis.   He's had no leg swelling.  He's had no headache.  Overall, his performance status is ECOG 1.   Medications:  Current Outpatient Prescriptions:  .  dexamethasone (DECADRON) 4 MG tablet, Take 2 tablets by mouth daily., Disp: , Rfl:  .  pazopanib (VOTRIENT) 200 MG tablet, Take 4 tablets (800 mg total) by mouth daily. Take on an empty stomach., Disp: 120 tablet, Rfl: 2 .  tamsulosin (FLOMAX) 0.4 MG CAPS capsule, Take 1 capsule (0.4 mg total) by mouth daily., Disp: 30 capsule, Rfl: 3 No current facility-administered medications for this visit.   Facility-Administered Medications Ordered in Other Visits:  .  denosumab (XGEVA) injection 120 mg, 120 mg, Subcutaneous, Once, [name], MD  Allergies: No Known Allergies  Past Medical History, Surgical history, Social history, and Family History were reviewed and updated.  Review of Systems:  As above  Physical  Exam:  weight is 234 lb 12.8 oz (106.5 kg). His oral temperature is 98.7 F (37.1 C). His blood pressure is 153/91 (abnormal) and his pulse is 67. His respiration is 19 and oxygen saturation is 93%.   Wt Readings from Last 3 Encounters:  03/31/17 234 lb 12.8 oz (106.5 kg)  02/28/17 220 lb 1.3 oz (99.8 kg)  02/17/17 224 lb (101.6 kg)     Head and neck exam shows no ocular or oral lesions. He has no palpable cervical or supraclavicular lymph nodes. Lungs are clear bilaterally. Cardiac exam regular rate and rhythm with no murmurs, rubs or bruits. Abdomen is soft. He has good bowel sounds. There is no fluid wave. There is no guarding or rebound tenderness. There is no palpable liver or spleen tip. Axillary exam shows no bilateral axillary adenopathy. Back exam shows no tenderness over the spine, ribs or hips. Extremities shows no clubbing, cyanosis or edema. Neurological exam shows no focal neurological deficits. Skin exam shows no rashes, ecchymoses or petechia.  Lab Results  Component Value Date   WBC 6.4 03/31/2017   HGB 14.0 03/31/2017   HCT 41.2 03/31/2017   MCV 82 03/31/2017   PLT 152 03/31/2017     Chemistry      Component Value Date/Time   NA 140 02/28/2017 1415   NA 138 01/27/2017 1128   K 4.4 02/28/2017 1415   K 4.4 01/27/2017 1128   CL 98 02/28/2017 1415   CO2 29  02/28/2017 1415   CO2 26 01/27/2017 1128   BUN 14 02/28/2017 1415   BUN 10.1 01/27/2017 1128   CREATININE 0.8 02/28/2017 1415   CREATININE 0.7 01/27/2017 1128      Component Value Date/Time   CALCIUM 9.1 02/28/2017 1415   CALCIUM 9.7 01/27/2017 1128   ALKPHOS 113 (H) 02/28/2017 1415   ALKPHOS 123 01/27/2017 1128   AST 37 02/28/2017 1415   AST 21 01/27/2017 1128   ALT 62 (H) 02/28/2017 1415   ALT 18 01/27/2017 1128   BILITOT 0.90 02/28/2017 1415   BILITOT 0.36 01/27/2017 1128         Impression and Plan: Alexander Fry is a 56 year old white male. He has widely metastatic renal cell carcinoma of the right  kidney. Thankfully, this is clear-cell histology.  I think that things are working well. I believe that the Votrient along with the radiation is helping him.  I will give him Xgeva today. I think this will definitely help.  We will do a PET scan in 3 weeks. By then, he would've been on Xgeva for close to 3 months.  By his blood pressure, I have to believe that he is responding. By his increased weight, I think that he is responding.   He wants to go back to work. We can get him back to work I think. He works mostly out of his home. I will write a letter for him.  I spent about 35 minutes with him today.   [name], MD 4/26/201810:43 AM

## 2017-04-04 ENCOUNTER — Encounter: Payer: Self-pay | Admitting: *Deleted

## 2017-04-05 ENCOUNTER — Encounter: Payer: Self-pay | Admitting: *Deleted

## 2017-04-05 ENCOUNTER — Other Ambulatory Visit: Payer: Self-pay | Admitting: *Deleted

## 2017-04-05 DIAGNOSIS — C641 Malignant neoplasm of right kidney, except renal pelvis: Secondary | ICD-10-CM

## 2017-04-06 ENCOUNTER — Telehealth: Payer: Self-pay | Admitting: Hematology & Oncology

## 2017-04-06 NOTE — Telephone Encounter (Signed)
Per order to sch lab apt on the 10th.  Apt was sch and patient was called but there was no answer, so I left detailed message

## 2017-04-08 ENCOUNTER — Emergency Department (HOSPITAL_COMMUNITY): Payer: BLUE CROSS/BLUE SHIELD

## 2017-04-08 ENCOUNTER — Encounter (HOSPITAL_COMMUNITY): Payer: Self-pay | Admitting: *Deleted

## 2017-04-08 ENCOUNTER — Other Ambulatory Visit: Payer: Self-pay

## 2017-04-08 ENCOUNTER — Encounter: Payer: Self-pay | Admitting: Radiation Oncology

## 2017-04-08 ENCOUNTER — Emergency Department (HOSPITAL_COMMUNITY)
Admission: EM | Admit: 2017-04-08 | Discharge: 2017-04-08 | Disposition: A | Discharge status: Home / Self Care | Payer: BLUE CROSS/BLUE SHIELD | Attending: Emergency Medicine | Admitting: Emergency Medicine

## 2017-04-08 ENCOUNTER — Ambulatory Visit
Admission: RE | Admit: 2017-04-08 | Discharge: 2017-04-08 | Disposition: A | Discharge status: Home / Self Care | Payer: BLUE CROSS/BLUE SHIELD | Source: Ambulatory Visit | Attending: Radiation Oncology | Admitting: Radiation Oncology

## 2017-04-08 VITALS — BP 132/95 | HR 105 | Temp 99.1°F | Resp 22 | Ht 72.0 in | Wt 232.2 lb

## 2017-04-08 DIAGNOSIS — Z923 Personal history of irradiation: Secondary | ICD-10-CM | POA: Insufficient documentation

## 2017-04-08 DIAGNOSIS — R0789 Other chest pain: Secondary | ICD-10-CM | POA: Diagnosis not present

## 2017-04-08 DIAGNOSIS — C7931 Secondary malignant neoplasm of brain: Secondary | ICD-10-CM | POA: Diagnosis not present

## 2017-04-08 DIAGNOSIS — Z79899 Other long term (current) drug therapy: Secondary | ICD-10-CM | POA: Diagnosis not present

## 2017-04-08 DIAGNOSIS — J189 Pneumonia, unspecified organism: Secondary | ICD-10-CM | POA: Diagnosis not present

## 2017-04-08 DIAGNOSIS — C7951 Secondary malignant neoplasm of bone: Secondary | ICD-10-CM

## 2017-04-08 DIAGNOSIS — Y842 Radiological procedure and radiotherapy as the cause of abnormal reaction of the patient, or of later complication, without mention of misadventure at the time of the procedure: Secondary | ICD-10-CM | POA: Insufficient documentation

## 2017-04-08 DIAGNOSIS — C719 Malignant neoplasm of brain, unspecified: Secondary | ICD-10-CM | POA: Insufficient documentation

## 2017-04-08 DIAGNOSIS — R0602 Shortness of breath: Secondary | ICD-10-CM | POA: Diagnosis not present

## 2017-04-08 DIAGNOSIS — C7900 Secondary malignant neoplasm of unspecified kidney and renal pelvis: Secondary | ICD-10-CM | POA: Diagnosis not present

## 2017-04-08 DIAGNOSIS — R040 Epistaxis: Secondary | ICD-10-CM | POA: Insufficient documentation

## 2017-04-08 DIAGNOSIS — C649 Malignant neoplasm of unspecified kidney, except renal pelvis: Secondary | ICD-10-CM | POA: Diagnosis present

## 2017-04-08 DIAGNOSIS — R0600 Dyspnea, unspecified: Secondary | ICD-10-CM | POA: Diagnosis present

## 2017-04-08 LAB — CBC
HEMATOCRIT: 39 % (ref 39.0–52.0)
HEMOGLOBIN: 13.3 g/dL (ref 13.0–17.0)
MCH: 28.2 pg (ref 26.0–34.0)
MCHC: 34.1 g/dL (ref 30.0–36.0)
MCV: 82.8 fL (ref 78.0–100.0)
Platelets: 222 10*3/uL (ref 150–400)
RBC: 4.71 MIL/uL (ref 4.22–5.81)
RDW: 24.8 % — ABNORMAL HIGH (ref 11.5–15.5)
WBC: 6.8 10*3/uL (ref 4.0–10.5)

## 2017-04-08 LAB — BASIC METABOLIC PANEL
ANION GAP: 7 (ref 5–15)
BUN: 15 mg/dL (ref 6–20)
CHLORIDE: 106 mmol/L (ref 101–111)
CO2: 26 mmol/L (ref 22–32)
Calcium: 8.7 mg/dL — ABNORMAL LOW (ref 8.9–10.3)
Creatinine, Ser: 0.57 mg/dL — ABNORMAL LOW (ref 0.61–1.24)
GFR calc non Af Amer: >60 mL/min (ref >60)
GLUCOSE: 106 mg/dL — AB (ref 65–99)
POTASSIUM: 4 mmol/L (ref 3.5–5.1)
Sodium: 139 mmol/L (ref 135–145)

## 2017-04-08 LAB — I-STAT TROPONIN, ED: TROPONIN I, POC: 0 ng/mL (ref 0.00–0.08)

## 2017-04-08 MED ORDER — IOPAMIDOL (ISOVUE-370) INJECTION 76%
INTRAVENOUS | Status: AC
  Filled 2017-04-08: qty 100

## 2017-04-08 MED ORDER — IOPAMIDOL (ISOVUE-370) INJECTION 76%
100.0000 mL | Freq: Once | INTRAVENOUS | Status: AC | PRN
  Administered 2017-04-08: 100 mL via INTRAVENOUS

## 2017-04-08 MED ORDER — ALBUTEROL SULFATE (2.5 MG/3ML) 0.083% IN NEBU: 5.0000 mg | INHALATION_SOLUTION | Freq: Once | RESPIRATORY_TRACT | Status: DC

## 2017-04-08 NOTE — ED Triage Notes (Signed)
Pt brought in from Cancer ctr d/t SOB and chest tightness with exertion for a week now.  Denies noticing any swelling in his ankles.  Pt was at the cancer ctr this am for radiation therapy of his brain and takes chemo pills daily.  Denies any SOB or chest tightness at this time.  Was sent to the ED to r/o PE.

## 2017-04-08 NOTE — ED Provider Notes (Signed)
[facility] DEPT Provider Note   CSN: [id number] Arrival date & time: 04/08/17  1130     History   Chief Complaint Chief Complaint  Patient presents with  . Shortness of Breath  . Chest Pain    HPI Alexander Fry is a 56 y.o. male.  HPI Patient is referred from the [facility] for evaluation for pulmonary embolus. He has a history of metastatic brain cancer from renal cell carcinoma. He was being seen in routine follow-up. He did report increased dyspnea and chest pain with deep inspiration. Patient reports his symptoms haven't developing for several weeks. He reports he has started going to the gym again and is taking it easy while he is there but working his way back up. He denies large swelling or calf pain. He denies cough or sputum production. He denies hemoptysis or fever. Past Medical History:  Diagnosis Date  . Goals of care, counseling/discussion 12/22/2016  . History of radiation therapy 02/11/2017   SRT right posterior frontal 12 mm target 20 Gy, Right anterior frontal 34mm 20 Gy  . History of radiation therapy 02/21/2017   SRT Right post parietal 83mm target 20 Gy, left Post parietal 62mm 20 Gy, Right Occipital 4 mm 20Gy, Left Occipital 4 mm 20 Gy  . History of radiation therapy 02/25/2017, 02/28/2017, 03/02/2017   SRT L1 spine 27 Gy 3 fractions, L4 Spine 27 Gy 3 fractions  . Inguinal hernia of left side without obstruction or gangrene   . Pneumonia    left lung  . Renal cell carcinoma, right ([place]) 01/05/2017  . 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 His vision has "spots" at times    Patient Active Problem List   Diagnosis Date Noted  . Transaminitis 02/20/2017  . Onychomycosis of left great toe 02/17/2017  . Corn of foot 02/17/2017  . Brain metastases ([place]) 01/30/2017  . Bone metastases ([place]) 01/30/2017  . Internal hemorrhoids 01/07/2017  . Kidney cancer, primary, with metastasis from kidney to other site, right ([place]) 01/05/2017  . Acute  right-sided low back pain with right-sided sciatica 12/24/2016  . Goals of care, counseling/discussion 12/22/2016  . Benign prostatic hyperplasia with nocturia 12/10/2016  . Abnormal CT of the chest 12/10/2016  . Hilar adenopathy 12/10/2016  . Multiple pulmonary nodules determined by computed tomography of lung 12/10/2016    Past Surgical History:  Procedure Laterality Date  . HERNIA REPAIR    . INGUINAL HERNIA REPAIR Left        Home Medications    Prior to Admission medications   Medication Sig Start Date End Date Taking? Authorizing Provider  dexamethasone (DECADRON) 4 MG tablet Take 1 tablet by mouth daily. Taking 2mg  QOD through 04-15-17, then stop. 02/23/17  Yes Historical Provider, MD  pazopanib (VOTRIENT) 200 MG tablet Take 4 tablets (800 mg total) by mouth daily. Take on an empty stomach. 01/17/17  Yes [name], MD  tamsulosin (FLOMAX) 0.4 MG CAPS capsule Take 1 capsule (0.4 mg total) by mouth daily. 12/10/16  Yes [name], PA-C    Family History Family History  Problem Relation Age of Onset  . Heart disease Mother   . Diabetes Mother   . Heart disease Father   . Alcohol abuse Brother     Social History Social History  Substance Use Topics  . Smoking status: Never Smoker  . Smokeless tobacco: Never Used  . Alcohol use Yes     Comment: social use in  the past     Allergies   Patient has no known allergies.   Review of Systems Review of Systems 10 Systems reviewed and are negative for acute change except as noted in the HPI.   Physical Exam Updated Vital Signs BP 126/82   Pulse 81   Temp 98.2 F (36.8 C) (Oral)   Resp (!) 26   SpO2 91%   Physical Exam  Constitutional: He is oriented to person, place, and time. He appears well-developed and well-nourished.  HENT:  Head: Normocephalic and atraumatic.  Mouth/Throat: Oropharynx is clear and moist.  Eyes: Conjunctivae and EOM are normal.  Neck: Neck supple.  Cardiovascular:  Normal rate, regular rhythm, normal heart sounds and intact distal pulses.   No murmur heard. Pulmonary/Chest: Effort normal and breath sounds normal. No respiratory distress.  Abdominal: Soft. There is no tenderness.  Musculoskeletal: He exhibits no edema, tenderness or deformity.  Neurological: He is alert and oriented to person, place, and time. No cranial nerve deficit. He exhibits normal muscle tone. Coordination normal.  Skin: Skin is warm and dry.  Psychiatric: He has a normal mood and affect.  Nursing note and vitals reviewed.    ED Treatments / Results  Labs (all labs ordered are listed, but only abnormal results are displayed) Labs Reviewed  BASIC METABOLIC PANEL - Abnormal; Notable for the following:       Result Value   Glucose, Bld 106 (*)    Creatinine, Ser 0.57 (*)    Calcium 8.7 (*)    All other components within normal limits  CBC - Abnormal; Notable for the following:    RDW 24.8 (*)    All other components within normal limits  I-STAT TROPOININ, ED    EKG  EKG Interpretation  Date/Time:  Friday Apr 08 2017 11:38:20 EDT Ventricular Rate:  88 PR Interval:    QRS Duration: 103 QT Interval:  352 QTC Calculation: 426 R Axis:   4 Text Interpretation:  Sinus rhythm RSR' in V1 or V2, right VCD or RVH agree. no acute ischemic appearance. no old comparison Confirmed by Johnney Killian, MD, Jeannie Done [phone]) on 04/08/2017 1:41:39 PM       Radiology Dg Chest 2 View  Result Date: 04/08/2017 CLINICAL DATA:  Shortness of breath, chest tightness EXAM: CHEST  2 VIEW COMPARISON:  12/09/2016 FINDINGS: Mild cardiomegaly. No effusions or edema. No confluent opacity is. No acute bony abnormality. IMPRESSION: Mild cardiomegaly.  No confluent airspace opacity or acute findings. Electronically Signed   By: Rolm Baptise M.D.   On: 04/08/2017 12:36   Ct Angio Chest Pe W/cm &/or Wo Cm  Result Date: 04/08/2017 CLINICAL DATA:  Metastatic renal cell carcinoma. Dyspnea and chest pain. Currently  undergoing radiation therapy to the brain and lumbar spine. Oral chemotherapy. EXAM: CT ANGIOGRAPHY CHEST WITH CONTRAST TECHNIQUE: Multidetector CT imaging of the chest was performed using the standard protocol during bolus administration of intravenous contrast. Multiplanar CT image reconstructions and MIPs were obtained to evaluate the vascular anatomy. CONTRAST:  100 cc Isovue 370 IV. COMPARISON:  12/10/2016 chest CT.  01/03/2017 PET-CT. FINDINGS: Cardiovascular: The study is high quality for the evaluation of pulmonary embolism. There are no filling defects in the central, lobar, segmental or subsegmental pulmonary artery branches to suggest acute pulmonary embolism. Mildly atherosclerotic nonaneurysmal thoracic aorta. Normal caliber pulmonary arteries. Normal heart size. No significant pericardial fluid/thickening. Left anterior descending coronary atherosclerosis. Mediastinum/Nodes: No discrete thyroid nodules. Unremarkable esophagus. No axillary adenopathy. Subcarinal adenopathy measures 1.9 cm (series  5/ image 44), decreased from 2.7 cm on 12/10/2016. Left paratracheal 2.0 cm enlarged node (series 5/image 27), previously 1.9 cm, not appreciably changed. Enlarged AP window 2.1 cm node (series 5/image 38) is stable. Enlarged 1.7 cm right prevascular node (series 5/image 42) is stable. Bilateral hilar adenopathy is mildly decreased, for example 2.5 cm on the right (series 5/image 39) is decreased from 3.1 cm and 1.6 cm on the left (series 5/image 48) is decreased from 1.9 cm. Lungs/Pleura: No pneumothorax. No pleural effusion. Previously visualized numerous bilateral pulmonary nodules have significantly decreased in size or resolved. For example the previously visualized 2.6 x 1.3 cm right lower lobe pulmonary nodule is absent on this scan. Previously visualized 1.7 cm superior segment left lower lobe pulmonary nodule is stable in size although now predominantly cavitary on series 8/image 31. Previously  visualized apical left upper lobe 1.4 cm solid pulmonary nodule is decreased to 0.5 cm (series 8/ image 23). There is new extensive patchy ground-glass opacity throughout both lungs. Upper abdomen: Low-attenuation 2.8 cm right liver lobe lesion (series 5/ image 75) is slightly decreased from 3.1 cm on 01/03/2017. Cholelithiasis. Musculoskeletal: No aggressive appearing focal osseous lesions. Mild thoracic spondylosis. Review of the MIP images confirms the above findings. IMPRESSION: 1. No pulmonary embolism. 2. New extensive patchy ground-glass opacity throughout both lungs. Pulmonary drug toxicity is a leading consideration. Atypical infection is less likely. 3. Mediastinal and bilateral hilar adenopathy is decreased. Pulmonary metastases are significantly decreased. Right liver lobe metastasis is mildly decreased. 4. Additional findings include aortic atherosclerosis, 1 vessel coronary atherosclerosis and cholelithiasis. Electronically Signed   By: Ilona Sorrel M.D.   On: 04/08/2017 15:13    Procedures Procedures (including critical care time)  Medications Ordered in ED Medications  iopamidol (ISOVUE-370) 76 % injection 100 mL (100 mLs Intravenous Contrast Given 04/08/17 1430)     Initial Impression / Assessment and Plan / ED Course  I have reviewed the triage vital signs and the nursing notes.  Pertinent labs & imaging results that were available during my care of the patient were reviewed by me and considered in my medical decision making (see chart for details).      Consult: Reviewed as oncology. At this time may continue Decadron and follow-up as an outpatient. Final Clinical Impressions(s) / ED Diagnoses   Final diagnoses:  Pneumonitis  Metastatic renal cell carcinoma, unspecified laterality ([place])  Patient has had gradually developing exertional dyspnea. Pulmonary embolus is ruled out. CT chest indicates probable pneumonitis suspected secondary to chemotherapy or possibly radiation.  No signs of secondary infection. Patient is stable. Plan will be for continued outpatient management through oncology.  New Prescriptions New Prescriptions   No medications on file     Charlesetta Shanks, MD 04/08/17 1635

## 2017-04-08 NOTE — ED Notes (Signed)
Bed: WA07 Expected date:  Expected time:  Means of arrival:  Comments: Cancer center-r/o PE

## 2017-04-08 NOTE — ED Notes (Signed)
Pt resting comfortably without complaints-waiting for CT angio

## 2017-04-08 NOTE — Progress Notes (Signed)
Alexander Fry presents for follow up of radiation completed 02/11/17 to his Brain, and 02/21/17 to his Brain. He reports fatigue since completing radiation. He is now going to the gym every other day. He does have shortness of breath with activity. He also describes a tightness in his chest/throat when he takes a deep breath.  He denies pain at this time. He does admit to discomfort in his lower back. He denies headaches. He is eating well, but still continues to have some taste changes. He has had occasional nose bleeds to his right nostril. He reports soreness and dryness to this side of his nose.   BP (!) 132/95   Pulse (!) 105   Temp 99.1 F (37.3 C)   Ht 6' (1.829 m)   Wt 232 lb 3.2 oz (105.3 kg)   SpO2 94% Comment: room air  BMI 31.49 kg/m    Wt Readings from Last 3 Encounters:  04/08/17 232 lb 3.2 oz (105.3 kg)  03/31/17 234 lb 12.8 oz (106.5 kg)  02/28/17 220 lb 1.3 oz (99.8 kg)

## 2017-04-08 NOTE — Progress Notes (Addendum)
Radiation Oncology         [phone] ________________________________  Name: Alexander Fry MRN: [id number]  Date: 04/08/2017  DOB: Sep 28, 1961  Follow-Up Visit Note  Outpatient  CC: [name], PA-C  [name], MD  Diagnosis:  Metastatic renal cell carcinoma to the brain and lumbar spine     ICD-9-CM ICD-10-CM   1. Brain metastases (HCC) 198.3 C79.31   2. Bone metastases (HCC) 198.5 C79.51     CHIEF COMPLAINT: Here for follow-up and surveillance of metastatic renal cell carcinoma  Interval Since Last Radiation:  1 months   02-25-17, 02-28-17, 03-02-17                                                  [name] received stereotactic radiosurgery to the following targets using 10MV FFF photons. Comments: L1 Spine target was treated using 3 Rapid Arc VMAT Beams to a prescription dose of  27 Gy in 3 fractions.    Comments: L4 Spine target was treated using 3 Rapid Arc VMAT Beams to a prescription dose of  of 27 Gy in 3 fractions.    02-21-17 Right Post Parietal 51mm target was treated using 4Dynamic Conformal Arcs to a prescription dose of 20 Gy.   Left Post Parietal 3 mm target was treated using 3 circular collimator arcs to a prescription dose of 20 Gy.   Right Occipital 79mm target was treated using 3Dynamic Conformal Arcs to a prescription dose of 20 Gy.   Left Occipital 4 mm target was treated using 3Dynamic Conformal Arcs to a prescription dose of 20 Gy.   02-11-17 Right posterior frontal 46mm target was treated using 3 Dynamic Conformal Arcs to a prescription dose of 20 Gy.   Right anterior frontal 66mm target was treated using 3 Dynamic Conformal Arcs to a prescription dose of 20 Gy.    Narrative:  The patient returns today for routine follow-up.  The patient followed up with Dr. Marin Olp on 03/31/17. The patient's therapy include votrient 800 mg by mouth daily since February 2018 and Xgeva 120 mg sq monthly. Medical oncology's plans for the patient are  to continue his current therapy and a PET scan on 04/15/17.  He reports fatigue since completing radiation and he is now going to the gym every other day. He does have SOB with activity. He also describes a tightness in his chest/throat when he takes a deep breath.  He denies pain at this time. He does admit to discomfort in his lower back. He denies headaches. He is eating well, but still continues to have some taste changes. He has had occasional nose bleeds from his right nostril. He reports soreness and dryness to this side of his nose. He reports it's not much blood. The patient is on Decadron 4 mg (1/2 tablet daily equating to 2 mg daily). He reports his thigh muscles are now weaker.  ALLERGIES:  has No Known Allergies.  Meds: Current Outpatient Prescriptions  Medication Sig Dispense Refill  . dexamethasone (DECADRON) 4 MG tablet Take 1 tablet by mouth daily. He is taking 1/2 tablet daily until 04/20/17 then he will stop taking it.    . pazopanib (VOTRIENT) 200 MG tablet Take 4 tablets (800 mg total) by mouth daily. Take on an empty stomach. 120 tablet 2  . tamsulosin (FLOMAX) 0.4 MG CAPS  capsule Take 1 capsule (0.4 mg total) by mouth daily. 30 capsule 3   No current facility-administered medications for this encounter.     Physical Findings: The patient is in no acute distress. Patient is alert and oriented.  height is 6' (1.829 m) and weight is 232 lb 3.2 oz (105.3 kg). His temperature is 99.1 F (37.3 C). His blood pressure is 132/95 (abnormal) and his pulse is 105 (abnormal). His respiration is 22 (abnormal) and oxygen saturation is 94%.   General: Alert and oriented, in no acute distress HEENT: (+) Swelling of his face consisting with swelling from steroid use. Extraocular movements are intact. Oropharynx is clear with no thrush. Neck: Neck is supple, no palpable cervical or supraclavicular lymphadenopathy. Heart: (+) Heart is slightly tachycardic, regular rhythm with no  murmurs. Chest: Clear to auscultation bilaterally, with no rhonchi, wheezes, or rales. Extremities: No cyanosis or edema. Lymphatics: see Neck Exam Skin: No concerning lesions. Musculoskeletal: symmetric strength and muscle tone throughout. No calf tenderness to palpation. Neurologic: Cranial nerves II through XII are grossly intact. No obvious focalities. Speech is fluent. Coordination is intact. Psychiatric: Judgment and insight are intact. Affect is appropriate.  Lab Findings: Lab Results  Component Value Date   WBC 6.8 04/08/2017   HGB 13.3 04/08/2017   HCT 39.0 04/08/2017   MCV 82.8 04/08/2017   PLT 222 04/08/2017   CMP Latest Ref Rng & Units 04/08/2017 03/31/2017 02/28/2017  Glucose 65 - 99 mg/dL 106(H) 100 135(H)  BUN 6 - 20 mg/dL 15 11 14   Creatinine 0.61 - 1.24 mg/dL 0.57(L) 0.7 0.8  Sodium 135 - 145 mmol/L 139 141 140  Potassium 3.5 - 5.1 mmol/L 4.0 4.1 4.4  Chloride 101 - 111 mmol/L 106 103 98  CO2 22 - 32 mmol/L 26 30 29   Calcium 8.9 - 10.3 mg/dL 8.7(L) 9.3 9.1  Total Protein 6.4 - 8.1 g/dL - 5.9(L) 6.1(L)  Total Bilirubin 0.20 - 1.60 mg/dl - 1.00 0.90  Alkaline Phos 26 - 84 U/L - 98(H) 113(H)  AST 11 - 38 U/L - 55(H) 37  ALT 10 - 47 U/L - 100(H) 62(H)    Radiographic Findings: Dg Chest 2 View  Result Date: 04/08/2017 CLINICAL DATA:  Shortness of breath, chest tightness EXAM: CHEST  2 VIEW COMPARISON:  12/09/2016 FINDINGS: Mild cardiomegaly. No effusions or edema. No confluent opacity is. No acute bony abnormality. IMPRESSION: Mild cardiomegaly.  No confluent airspace opacity or acute findings. Electronically Signed   By: Rolm Baptise M.D.   On: 04/08/2017 12:36    Impression/Plan:  Recovering from the effects of radiation.  The patient's pulse oximetry is lower than normal, he is tachypneic, tachycardic, experiences more SOB than normal, chest tightness when taking a deep breath, he has cancer, and he is on steroids. All of these factors point towards a potential  pulmonary embolism. We will have him worked up in the ED after this encounter. We will also have the patient taper his steroids further as he is no longer in severe pain.  Decadron 2mg  QOD for one more week, then stop.  The patient is scheduled for a PET scan on 04/15/17 w/ med onc. We will schedule MRI of the treated spine and brain in 2 months and follow up with Dr. Vertell Limber of Neurosurgery. I will alternate appointments with Dr. Vertell Limber.  PT referral for muscle atrophy, deconditioning from decadron.   _____________________________________   [name], MD  This document serves as a record of services personally performed  by [name], MD. It was created on her behalf by Darcus Austin, a trained medical scribe. The creation of this record is based on the scribe's personal observations and the provider's statements to them. This document has been checked and approved by the attending provider.

## 2017-04-11 ENCOUNTER — Telehealth: Payer: Self-pay | Admitting: *Deleted

## 2017-04-11 ENCOUNTER — Encounter: Payer: Self-pay | Admitting: Hematology & Oncology

## 2017-04-11 NOTE — Telephone Encounter (Signed)
CALLED PATIENT TO INFORM OF PT APPT. FOR Tuesday MAY 8 @ 1:45 PM @ [facility], LVM FOR A RETURN CALL

## 2017-04-12 ENCOUNTER — Ambulatory Visit: Payer: BLUE CROSS/BLUE SHIELD | Attending: Radiation Oncology | Admitting: Physical Therapy

## 2017-04-12 DIAGNOSIS — M6281 Muscle weakness (generalized): Secondary | ICD-10-CM | POA: Insufficient documentation

## 2017-04-12 DIAGNOSIS — G8929 Other chronic pain: Secondary | ICD-10-CM | POA: Diagnosis present

## 2017-04-12 DIAGNOSIS — M5441 Lumbago with sciatica, right side: Secondary | ICD-10-CM | POA: Diagnosis present

## 2017-04-13 NOTE — Therapy (Signed)
[place], [place], [address] Phone: [phone]   Fax:  [phone]  Physical Therapy Evaluation  Patient Details  Name: Alexander Fry MRN: [id number] Date of Birth: 57-19-1962 Referring Provider: Dr. Isidore Moos   Encounter Date: 04/12/2017      PT End of Session - 04/13/17 1313    Visit Number 1   Number of Visits 5   Date for PT Re-Evaluation 05/20/17   PT Start Time 8921   PT Stop Time 1430   PT Time Calculation (min) 45 min   Activity Tolerance Patient tolerated treatment well   Behavior During Therapy [facility] for tasks assessed/performed      Past Medical History:  Diagnosis Date  . Goals of care, counseling/discussion 12/22/2016  . History of radiation therapy 02/11/2017   SRT right posterior frontal 12 mm target 20 Gy, Right anterior frontal 49mm 20 Gy  . History of radiation therapy 02/21/2017   SRT Right post parietal 8mm target 20 Gy, left Post parietal 74mm 20 Gy, Right Occipital 4 mm 20Gy, Left Occipital 4 mm 20 Gy  . History of radiation therapy 02/25/2017, 02/28/2017, 03/02/2017   SRT L1 spine 27 Gy 3 fractions, L4 Spine 27 Gy 3 fractions  . Inguinal hernia of left side without obstruction or gangrene   . Pneumonia    left lung  . Renal cell carcinoma, right ([place]) 01/05/2017  . 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 His vision has "spots" at times    Past Surgical History:  Procedure Laterality Date  . HERNIA REPAIR    . INGUINAL HERNIA REPAIR Left     There were no vitals filed for this visit.       Subjective Assessment - 04/12/17 1353    Subjective Pt has weakness in his upper legs    Pertinent History Right kidney tumor diagnosed in Jan 18. He has metastatic lesions in lumbar spine with right sided leg pain  with radiation and brain with sterotactic radiation in March 2018, with possible pneumonitis that may be a consequence from stepping down from a steriod   Remote his of back injury.  In 2015 weight 310# but lost >75 pounds with diet and exercise.    Limitations --  moves slowly    Patient Stated Goals To stengthen my upper thigh muscles ( weakness from steroids and treatments )    Currently in Pain? No/denies  discomfort and tightness in back             [facility] PT Assessment - 04/13/17 0001      Assessment   Medical Diagnosis renal cancer    Referring Provider Dr. Isidore Moos    Onset Date/Surgical Date 12/13/16   Hand Dominance Right     Precautions   Precautions Other (comment)  on chemo pills      Restrictions   Weight Bearing Restrictions No     Balance Screen   Has the patient fallen in the past 6 months Yes   How many times? 1   Has the patient had a decrease in activity level because of a fear of falling?  No   Is the patient reluctant to leave their home because of a fear of falling?  No     Home Environment   Living Environment Private residence   Living Arrangements Other relatives   Available Help at Discharge Available PRN/intermittently     Prior Function   Level of  Independence Independent   Vocation Other (comment)  wants to return to work    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  does exercise machines , treadmill 8 degrees  elevation  4.0 mph      Cognition   Overall Cognitive Status Within Functional Limits for tasks assessed     Observation/Other Assessments   Observations pt comes in walking without a cane      Sensation   Additional Comments had numbness in his right leg, now only occasional tingling since the radiation      Coordination   Gross Motor Movements are Fluid and Coordinated Yes     Sit to Stand   Comments 9 full ones with 8/10 exertion with dyspnea      Posture/Postural Control   Posture/Postural Control Postural limitations   Postural  Limitations Rounded Shoulders;Forward head     AROM   Overall AROM  Within functional limits for tasks performed     Strength   Strength Assessment Site Hip;Knee;Ankle   Right/Left Hip Right;Left   Right Hip Flexion 4+/5   Right Hip Extension 4+/5   Right Hip ABduction 3-/5   Left Hip Flexion 5/5   Left Hip Extension 5/5   Left Hip ABduction 4/5     Timed Up and Go Test   Normal TUG (seconds) 8.85   Cognitive TUG (seconds) 8.5  counting backward from 50     Functional Gait  Assessment   Gait assessed  Yes   Gait Level Surface Walks 20 ft in less than 5.5 sec, no assistive devices, good speed, no evidence for imbalance, normal gait pattern, deviates no more than 6 in outside of the 12 in walkway width.   Change in Gait Speed Able to smoothly change walking speed without loss of balance or gait deviation. Deviate no more than 6 in outside of the 12 in walkway width.   Gait with Horizontal Head Turns Performs head turns smoothly with no change in gait. Deviates no more than 6 in outside 12 in walkway width   Gait with Vertical Head Turns Performs head turns with no change in gait. Deviates no more than 6 in outside 12 in walkway width.   Gait and Pivot Turn Pivot turns safely within 3 sec and stops quickly with no loss of balance.   Step Over Obstacle Is able to step over 2 stacked shoe boxes taped together (9 in total height) without changing gait speed. No evidence of imbalance.   Gait with Narrow Base of Support Is able to ambulate for 10 steps heel to toe with no staggering.   Gait with Eyes Closed Walks 20 ft, no assistive devices, good speed, no evidence of imbalance, normal gait pattern, deviates no more than 6 in outside 12 in walkway width. Ambulates 20 ft in less than 7 sec.   Ambulating Backwards Walks 20 ft, no assistive devices, good speed, no evidence for imbalance, normal gait   FGA comment: gait velocity 1.55 meters per second                                     [facility] Goals - 04/13/17 1321      CC Long Term Goal  #1   Title Patient will be  independent in an exercise program using body weight for strenthening lower extremities    Time 4   Period Weeks   Status New     CC Long Term Goal  #2   Title Pt will report decrease in pain and feelings of weakness in his legs by 50%    Time 4   Period Weeks   Status New     CC Long Term Goal  #3   Title Pt will state he has an understanding of good body mechanics so he can return to work and protect his back from injury    Time 4   Period Weeks   Status New            Plan - 04/13/17 1314    Clinical Impression Statement 56 yo male with metastatic renal cancer to bone and brain who did very well with functional evaluation.  The only deficit to objective testing on this date with decreased hip abductor strength on the right with some dyspnea on exertions  He is concerned about right low back pain his perceived muscle atrophy in his legs.  He will benefit from insturction in core strengthening an body weight exercise for strengthening instead of external force of exercise machines at the gym. Becasue of comorbidity of metastatic cancer this is a moderately complex eval    Rehab Potential Excellent   Clinical Impairments Affecting Rehab Potential metastatic cancer to bone and brain   PT Frequency 1x / week   PT Duration 4 weeks   PT Treatment/Interventions ADLs/Self Care Home Management;Patient/family education;Therapeutic exercise;Neuromuscular re-education   PT Next Visit Plan Do 6 minute walk test monitoring vitals. instruct in transverse abdominus series , hip abductor exercises, one legged stance strength progression.  Begin instruction in body mechanics to protect low back from injury    Consulted and Agree with Plan of Care Patient      Patient will benefit from skilled therapeutic intervention in order to improve the  following deficits and impairments:  Postural dysfunction, Pain, Decreased strength, Cardiopulmonary status limiting activity  Visit Diagnosis: Decreased muscle strength  Chronic right-sided low back pain with right-sided sciatica     Problem List Patient Active Problem List   Diagnosis Date Noted  . Transaminitis 02/20/2017  . Onychomycosis of left great toe 02/17/2017  . Corn of foot 02/17/2017  . Brain metastases ([place]) 01/30/2017  . Bone metastases ([place]) 01/30/2017  . Internal hemorrhoids 01/07/2017  . Kidney cancer, primary, with metastasis from kidney to other site, right ([place]) 01/05/2017  . Acute right-sided low back pain with right-sided sciatica 12/24/2016  . Goals of care, counseling/discussion 12/22/2016  . Benign prostatic hyperplasia with nocturia 12/10/2016  . Abnormal CT of the chest 12/10/2016  . Hilar adenopathy 12/10/2016  . Multiple pulmonary nodules determined by computed tomography of lung 12/10/2016   Donato Heinz. Owens Shark PT  Norwood Levo 04/13/2017, 1:25 PM  [place], [place], [address] Phone: [phone]   Fax:  [phone]  Name: Maceo Hernan MRN: [id number] Date of Birth: 1961-05-09

## 2017-04-14 ENCOUNTER — Telehealth: Payer: Self-pay | Admitting: *Deleted

## 2017-04-14 ENCOUNTER — Other Ambulatory Visit (HOSPITAL_BASED_OUTPATIENT_CLINIC_OR_DEPARTMENT_OTHER): Payer: BLUE CROSS/BLUE SHIELD

## 2017-04-14 ENCOUNTER — Telehealth: Payer: Self-pay | Admitting: Hematology & Oncology

## 2017-04-14 DIAGNOSIS — C641 Malignant neoplasm of right kidney, except renal pelvis: Secondary | ICD-10-CM | POA: Diagnosis not present

## 2017-04-14 LAB — COMPREHENSIVE METABOLIC PANEL
ALT: 324 U/L (ref 0–55)
ANION GAP: 10 meq/L (ref 3–11)
AST: 245 U/L (ref 5–34)
Albumin: 3.3 g/dL — ABNORMAL LOW (ref 3.5–5.0)
Alkaline Phosphatase: 219 U/L — ABNORMAL HIGH (ref 40–150)
BILIRUBIN TOTAL: 1.19 mg/dL (ref 0.20–1.20)
BUN: 13.5 mg/dL (ref 7.0–26.0)
CALCIUM: 9.5 mg/dL (ref 8.4–10.4)
CHLORIDE: 105 meq/L (ref 98–109)
CO2: 26 meq/L (ref 22–29)
CREATININE: 0.7 mg/dL (ref 0.7–1.3)
Glucose: 114 mg/dl (ref 70–140)
Potassium: 4.5 mEq/L (ref 3.5–5.1)
Sodium: 141 mEq/L (ref 136–145)
Total Protein: 6.8 g/dL (ref 6.4–8.3)

## 2017-04-14 NOTE — Telephone Encounter (Signed)
Critical Value AST 245  ALT 324 Dr Marin Olp notified. No orders at this time

## 2017-04-14 NOTE — Telephone Encounter (Signed)
I left a phone message for Mr. buss. His liver function tests are quite high. Because of this, we have to have him stop the Votrient. I'm just very disappointed by this. I thought Votrient would clearly be helpful for him.  I told him to stop the Votrient. I told him to call us if he has any questions. I said if he continues the Votrient, then his liver can really have a hard time.  He will need to come back in next week so we can re-check his liver function test. I probably will see him next week.  Again, told him to stop the Votrient. If he has any questions he can let me know. If he has any issues with nausea or vomiting, diarrhea, abdominal pain, etc., please let me know.  Lattie Haw, MD

## 2017-04-15 ENCOUNTER — Encounter (HOSPITAL_COMMUNITY)
Admission: RE | Admit: 2017-04-15 | Discharge: 2017-04-15 | Disposition: A | Discharge status: Home / Self Care | Payer: BLUE CROSS/BLUE SHIELD | Source: Ambulatory Visit | Attending: Hematology & Oncology | Admitting: Hematology & Oncology

## 2017-04-15 ENCOUNTER — Encounter: Payer: Self-pay | Admitting: Hematology & Oncology

## 2017-04-15 DIAGNOSIS — C641 Malignant neoplasm of right kidney, except renal pelvis: Secondary | ICD-10-CM | POA: Insufficient documentation

## 2017-04-15 LAB — GLUCOSE, CAPILLARY: GLUCOSE-CAPILLARY: 103 mg/dL — AB (ref 65–99)

## 2017-04-15 MED ORDER — FLUDEOXYGLUCOSE F - 18 (FDG) INJECTION
11.3000 | Freq: Once | INTRAVENOUS | Status: AC | PRN
  Administered 2017-04-15: 11.3 via INTRAVENOUS

## 2017-04-19 ENCOUNTER — Ambulatory Visit (HOSPITAL_BASED_OUTPATIENT_CLINIC_OR_DEPARTMENT_OTHER): Payer: BLUE CROSS/BLUE SHIELD | Admitting: Family

## 2017-04-19 ENCOUNTER — Other Ambulatory Visit (HOSPITAL_BASED_OUTPATIENT_CLINIC_OR_DEPARTMENT_OTHER): Payer: BLUE CROSS/BLUE SHIELD

## 2017-04-19 ENCOUNTER — Telehealth: Payer: Self-pay | Admitting: *Deleted

## 2017-04-19 VITALS — BP 123/78 | HR 92 | Temp 99.4°F | Resp 18 | Wt 236.0 lb

## 2017-04-19 DIAGNOSIS — C641 Malignant neoplasm of right kidney, except renal pelvis: Secondary | ICD-10-CM | POA: Diagnosis not present

## 2017-04-19 DIAGNOSIS — C7931 Secondary malignant neoplasm of brain: Secondary | ICD-10-CM

## 2017-04-19 DIAGNOSIS — C787 Secondary malignant neoplasm of liver and intrahepatic bile duct: Secondary | ICD-10-CM | POA: Diagnosis not present

## 2017-04-19 DIAGNOSIS — C7951 Secondary malignant neoplasm of bone: Secondary | ICD-10-CM | POA: Diagnosis not present

## 2017-04-19 LAB — CBC WITH DIFFERENTIAL (CANCER CENTER ONLY)
BASO#: 0 10*3/uL (ref 0.0–0.2)
BASO%: 0.2 % (ref 0.0–2.0)
EOS ABS: 0 10*3/uL (ref 0.0–0.5)
EOS%: 0.6 % (ref 0.0–7.0)
HCT: 39.4 % (ref 38.7–49.9)
HGB: 13.1 g/dL (ref 13.0–17.1)
LYMPH#: 2.4 10*3/uL (ref 0.9–3.3)
LYMPH%: 49 % — ABNORMAL HIGH (ref 14.0–48.0)
MCH: 29.2 pg (ref 28.0–33.4)
MCHC: 33.2 g/dL (ref 32.0–35.9)
MCV: 88 fL (ref 82–98)
MONO#: 0.4 10*3/uL (ref 0.1–0.9)
MONO%: 8.4 % (ref 0.0–13.0)
NEUT#: 2.1 10*3/uL (ref 1.5–6.5)
NEUT%: 41.8 % (ref 40.0–80.0)
Platelets: 223 10*3/uL (ref 145–400)
RBC: 4.48 10*6/uL (ref 4.20–5.70)
RDW: 28 % — AB (ref 11.1–15.7)
WBC: 4.9 10*3/uL (ref 4.0–10.0)

## 2017-04-19 LAB — CMP (CANCER CENTER ONLY)
ALT(SGPT): 389 U/L (ref 10–47)
AST: 167 U/L — ABNORMAL HIGH (ref 11–38)
Albumin: 2.7 g/dL — ABNORMAL LOW (ref 3.3–5.5)
Alkaline Phosphatase: 406 U/L — ABNORMAL HIGH (ref 26–84)
BUN: 9 mg/dL (ref 7–22)
CHLORIDE: 103 meq/L (ref 98–108)
CO2: 26 meq/L (ref 18–33)
CREATININE: 0.8 mg/dL (ref 0.6–1.2)
Calcium: 9 mg/dL (ref 8.0–10.3)
GLUCOSE: 154 mg/dL — AB (ref 73–118)
Potassium: 3.6 mEq/L (ref 3.3–4.7)
SODIUM: 137 meq/L (ref 128–145)
TOTAL PROTEIN: 6 g/dL — AB (ref 6.4–8.1)
Total Bilirubin: 4.7 mg/dl — ABNORMAL HIGH (ref 0.20–1.60)

## 2017-04-19 NOTE — Progress Notes (Signed)
Hematology and Oncology Follow Up Visit  Alexander Fry [id number] 09-Mar-1961 56 y.o. 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 His Votrient was stopped last week on Friday due to elevated LFT's. We rechecked these today and his counts have gone up. ALT is now 389, AST 167 and Bilirubin is up to 4.70.  He has tenderness in the right upper quadrant on exam. Sclera are also mildly icteric.  He is feeling quite fatigued and has no stamina. He has weaned off of Decadron finishing yesterday. He has had some nausea, no vomiting, while weaning this down.  He has had some SOB with exertion but this has improved. He went to the ED 2 weeks ago with SOB and chest pain and had probable pneumonitis on CT angio. No PE. Treated with Decadron.  No fever, chills, cough, rash, dizziness, chest pain, palpitations or changes in bowel or bladder habits.  He states that he needs to drink more fluids but they taste "bad". He denies needing IV hydration today. Her verbalized that he will increase his fluid intake.  He has kept a good appetite and weight is stable.  No swelling, numbness or tingling in his extremities. The pain in his lower back and right leg comes and goes but seems to be a little better.   ECOG Performance Status: 1 - Symptomatic but completely ambulatory  Medications:  Allergies as of 04/19/2017   No Known Allergies     Medication List       Accurate as of 04/19/17 11:21 PM. Always use your most recent med list.          dexamethasone 4 MG tablet Commonly known as:  DECADRON Take 1 tablet by mouth daily. Taking 2mg  QOD through 04-15-17, then stop.   pazopanib 200 MG  tablet Commonly known as:  VOTRIENT Take 4 tablets (800 mg total) by mouth daily. Take on an empty stomach.   tamsulosin 0.4 MG Caps capsule Commonly known as:  FLOMAX Take 1 capsule (0.4 mg total) by mouth daily.       Allergies: No Known Allergies  Past Medical History, Surgical history, Social history, and Family History were reviewed and updated.  Review of Systems: All other 10 point review of systems is negative.   Physical Exam:  weight is 236 lb (107 kg). His oral temperature is 99.4 F (37.4 C). His blood pressure is 123/78 and his pulse is 92. His respiration is 18 and oxygen saturation is 94%.   Wt Readings from Last 3 Encounters:  04/19/17 236 lb (107 kg)  04/08/17 232 lb 3.2 oz (105.3 kg)  03/31/17 234 lb 12.8 oz (106.5 kg)    Ocular: Sclerae unicteric, pupils equal, round and reactive to light Ear-nose-throat: Oropharynx clear, dentition fair Lymphatic: No cervical, supraclavicular or axillary adenopathy Lungs no rales or rhonchi, good excursion bilaterally Heart regular rate and rhythm, no murmur appreciated Abd soft, tender in the right upper quadrant on exam, positive bowel sounds, no liver or spleen tip palpated on exam, no fluid wave MSK no focal spinal tenderness, no joint edema Neuro: non-focal, well-oriented, appropriate affect Breasts: Deferred   Lab Results  Component Value Date   WBC 4.9 04/19/2017  HGB 13.1 04/19/2017   HCT 39.4 04/19/2017   MCV 88 04/19/2017   PLT 223 04/19/2017   No results found for: FERRITIN, IRON, TIBC, UIBC, IRONPCTSAT Lab Results  Component Value Date   RBC 4.48 04/19/2017   No results found for: KPAFRELGTCHN, LAMBDASER, KAPLAMBRATIO No results found for: IGGSERUM, IGA, IGMSERUM No results found for: Odetta Pink, SPEI   Chemistry      Component Value Date/Time   NA 137 04/19/2017 1421   NA 141 04/14/2017 1041   K 3.6 04/19/2017 1421   K 4.5 04/14/2017  1041   CL 103 04/19/2017 1421   CO2 26 04/19/2017 1421   CO2 26 04/14/2017 1041   BUN 9 04/19/2017 1421   BUN 13.5 04/14/2017 1041   CREATININE 0.8 04/19/2017 1421   CREATININE 0.7 04/14/2017 1041      Component Value Date/Time   CALCIUM 9.0 04/19/2017 1421   CALCIUM 9.5 04/14/2017 1041   ALKPHOS 406 (H) 04/19/2017 1421   ALKPHOS 219 (H) 04/14/2017 1041   AST 167 (H) 04/19/2017 1421   AST 245 (HH) 04/14/2017 1041   ALT 389 (HH) 04/19/2017 1421   ALT 324 (HH) 04/14/2017 1041   BILITOT 4.70 (H) 04/19/2017 1421   BILITOT 1.19 04/14/2017 1041      Impression and Plan: Alexander Fry is a very pleasant 56 yo caucasian gentleman with extensive metastatic renal cell carcinoma of the right kidney (clear cell histology). He had to stop Votrient last week due to hepatotoxicity and is here today for follow-up and lab. His LFT are extremely elevated and bilirubin is up to 4.70.  He is symptomatic with fatigued and his sclera are mildly icteric. He verbalized that he will hydrate better over the next few days and we will repeat a CMP on Thursday. He is in agreement with this.    He understands that he is to avoid any medication containing Tylenol.  He has his current appointment schedule and his follow-up is next week and Delton See will be due at this time.  He will contact our office with any questions or concerns. We can certainly see him sooner if need be.   Eliezer Bottom, NP 5/15/201811:21 PM

## 2017-04-19 NOTE — Telephone Encounter (Signed)
Critical Value ALT 389 Dr Marin Olp notified. No orders at this time

## 2017-04-20 ENCOUNTER — Other Ambulatory Visit: Payer: Self-pay | Admitting: Family

## 2017-04-20 ENCOUNTER — Other Ambulatory Visit: Payer: Self-pay | Admitting: *Deleted

## 2017-04-20 DIAGNOSIS — C641 Malignant neoplasm of right kidney, except renal pelvis: Secondary | ICD-10-CM

## 2017-04-20 DIAGNOSIS — R945 Abnormal results of liver function studies: Secondary | ICD-10-CM

## 2017-04-20 DIAGNOSIS — R7989 Other specified abnormal findings of blood chemistry: Secondary | ICD-10-CM

## 2017-04-20 LAB — LACTATE DEHYDROGENASE: LDH: 400 U/L — AB (ref 125–245)

## 2017-04-21 ENCOUNTER — Telehealth: Payer: Self-pay | Admitting: *Deleted

## 2017-04-21 ENCOUNTER — Encounter (HOSPITAL_COMMUNITY): Payer: Self-pay | Admitting: *Deleted

## 2017-04-21 ENCOUNTER — Observation Stay (HOSPITAL_COMMUNITY): Payer: BLUE CROSS/BLUE SHIELD

## 2017-04-21 ENCOUNTER — Ambulatory Visit: Payer: BLUE CROSS/BLUE SHIELD | Admitting: Physical Therapy

## 2017-04-21 ENCOUNTER — Ambulatory Visit (HOSPITAL_BASED_OUTPATIENT_CLINIC_OR_DEPARTMENT_OTHER): Payer: BLUE CROSS/BLUE SHIELD | Admitting: Family

## 2017-04-21 ENCOUNTER — Observation Stay (HOSPITAL_COMMUNITY)
Admission: AD | Admit: 2017-04-21 | Discharge: 2017-04-23 | Disposition: A | Discharge status: Home / Self Care | Payer: BLUE CROSS/BLUE SHIELD | Source: Ambulatory Visit | Attending: Internal Medicine | Admitting: Internal Medicine

## 2017-04-21 ENCOUNTER — Other Ambulatory Visit (HOSPITAL_BASED_OUTPATIENT_CLINIC_OR_DEPARTMENT_OTHER): Payer: BLUE CROSS/BLUE SHIELD

## 2017-04-21 VITALS — BP 127/76 | HR 106 | Temp 99.2°F | Resp 20 | Wt 236.7 lb

## 2017-04-21 DIAGNOSIS — T50905A Adverse effect of unspecified drugs, medicaments and biological substances, initial encounter: Secondary | ICD-10-CM | POA: Insufficient documentation

## 2017-04-21 DIAGNOSIS — C641 Malignant neoplasm of right kidney, except renal pelvis: Secondary | ICD-10-CM | POA: Diagnosis not present

## 2017-04-21 DIAGNOSIS — Y638 Failure in dosage during other surgical and medical care: Secondary | ICD-10-CM | POA: Diagnosis not present

## 2017-04-21 DIAGNOSIS — K838 Other specified diseases of biliary tract: Secondary | ICD-10-CM

## 2017-04-21 DIAGNOSIS — Z79899 Other long term (current) drug therapy: Secondary | ICD-10-CM | POA: Insufficient documentation

## 2017-04-21 DIAGNOSIS — C7951 Secondary malignant neoplasm of bone: Secondary | ICD-10-CM

## 2017-04-21 DIAGNOSIS — R17 Unspecified jaundice: Secondary | ICD-10-CM | POA: Diagnosis not present

## 2017-04-21 DIAGNOSIS — R945 Abnormal results of liver function studies: Secondary | ICD-10-CM

## 2017-04-21 DIAGNOSIS — R7989 Other specified abnormal findings of blood chemistry: Secondary | ICD-10-CM

## 2017-04-21 DIAGNOSIS — R21 Rash and other nonspecific skin eruption: Secondary | ICD-10-CM | POA: Diagnosis not present

## 2017-04-21 DIAGNOSIS — R5383 Other fatigue: Secondary | ICD-10-CM | POA: Diagnosis present

## 2017-04-21 DIAGNOSIS — T887XXA Unspecified adverse effect of drug or medicament, initial encounter: Secondary | ICD-10-CM | POA: Insufficient documentation

## 2017-04-21 DIAGNOSIS — R351 Nocturia: Secondary | ICD-10-CM

## 2017-04-21 DIAGNOSIS — Z85528 Personal history of other malignant neoplasm of kidney: Secondary | ICD-10-CM | POA: Insufficient documentation

## 2017-04-21 DIAGNOSIS — N401 Enlarged prostate with lower urinary tract symptoms: Secondary | ICD-10-CM | POA: Diagnosis present

## 2017-04-21 DIAGNOSIS — C7931 Secondary malignant neoplasm of brain: Secondary | ICD-10-CM | POA: Diagnosis not present

## 2017-04-21 DIAGNOSIS — Z85830 Personal history of malignant neoplasm of bone: Secondary | ICD-10-CM | POA: Diagnosis not present

## 2017-04-21 LAB — COMPREHENSIVE METABOLIC PANEL
ALBUMIN: 2.8 g/dL — AB (ref 3.5–5.0)
ALT: 365 U/L (ref 0–55)
AST: 184 U/L — AB (ref 5–34)
Alkaline Phosphatase: 595 U/L — ABNORMAL HIGH (ref 40–150)
Anion Gap: 8 mEq/L (ref 3–11)
BUN: 7.8 mg/dL (ref 7.0–26.0)
CHLORIDE: 105 meq/L (ref 98–109)
CO2: 26 meq/L (ref 22–29)
Calcium: 9 mg/dL (ref 8.4–10.4)
Creatinine: 0.7 mg/dL (ref 0.7–1.3)
EGFR: >90 mL/min/{1.73_m2} (ref >90)
GLUCOSE: 180 mg/dL — AB (ref 70–140)
POTASSIUM: 3.9 meq/L (ref 3.5–5.1)
SODIUM: 138 meq/L (ref 136–145)
Total Bilirubin: 7.66 mg/dL (ref 0.20–1.20)
Total Protein: 6.5 g/dL (ref 6.4–8.3)

## 2017-04-21 LAB — CBC
HEMATOCRIT: 38.5 % — AB (ref 39.0–52.0)
HEMOGLOBIN: 12.6 g/dL — AB (ref 13.0–17.0)
MCH: 28.8 pg (ref 26.0–34.0)
MCHC: 32.7 g/dL (ref 30.0–36.0)
MCV: 88.1 fL (ref 78.0–100.0)
Platelets: 234 10*3/uL (ref 150–400)
RBC: 4.37 MIL/uL (ref 4.22–5.81)
RDW: 28 % — ABNORMAL HIGH (ref 11.5–15.5)
WBC: 5.6 10*3/uL (ref 4.0–10.5)

## 2017-04-21 LAB — CREATININE, SERUM: Creatinine, Ser: <0.3 mg/dL — ABNORMAL LOW (ref 0.61–1.24)

## 2017-04-21 MED ORDER — ONDANSETRON HCL 4 MG PO TABS: 4.0000 mg | ORAL_TABLET | Freq: Four times a day (QID) | ORAL | Status: DC | PRN

## 2017-04-21 MED ORDER — ENSURE ENLIVE PO LIQD
237.0000 mL | Freq: Two times a day (BID) | ORAL | Status: DC
  Administered 2017-04-22 (×2): 237 mL via ORAL

## 2017-04-21 MED ORDER — ACETAMINOPHEN 325 MG PO TABS: 650.0000 mg | ORAL_TABLET | Freq: Four times a day (QID) | ORAL | Status: DC | PRN

## 2017-04-21 MED ORDER — ONDANSETRON HCL 4 MG/2ML IJ SOLN: 4.0000 mg | Freq: Four times a day (QID) | INTRAMUSCULAR | Status: DC | PRN

## 2017-04-21 MED ORDER — ENOXAPARIN SODIUM 40 MG/0.4ML ~~LOC~~ SOLN
40.0000 mg | SUBCUTANEOUS | Status: DC
  Administered 2017-04-22: 30 mg via SUBCUTANEOUS
  Administered 2017-04-22: 40 mg via SUBCUTANEOUS
  Filled 2017-04-21 (×2): qty 0.4

## 2017-04-21 MED ORDER — IBUPROFEN 200 MG PO TABS: 400.0000 mg | ORAL_TABLET | Freq: Four times a day (QID) | ORAL | Status: DC | PRN

## 2017-04-21 MED ORDER — SODIUM CHLORIDE 0.9 % IV SOLN
INTRAVENOUS | Status: DC
  Administered 2017-04-21 – 2017-04-22 (×2): 1000 mL via INTRAVENOUS
  Administered 2017-04-23: 06:00:00 via INTRAVENOUS

## 2017-04-21 MED ORDER — ACETAMINOPHEN 650 MG RE SUPP: 650.0000 mg | Freq: Four times a day (QID) | RECTAL | Status: DC | PRN

## 2017-04-21 MED ORDER — DIPHENHYDRAMINE HCL 50 MG/ML IJ SOLN
25.0000 mg | Freq: Four times a day (QID) | INTRAMUSCULAR | Status: DC | PRN
  Administered 2017-04-22: 25 mg via INTRAVENOUS
  Filled 2017-04-21: qty 1

## 2017-04-21 MED ORDER — TAMSULOSIN HCL 0.4 MG PO CAPS
0.4000 mg | ORAL_CAPSULE | Freq: Every day | ORAL | Status: DC
  Administered 2017-04-21 – 2017-04-23 (×3): 0.4 mg via ORAL
  Filled 2017-04-21 (×3): qty 1

## 2017-04-21 NOTE — Progress Notes (Signed)
Hematology and Oncology Follow Up Visit  Alexander Fry [id number] [birth year] 56 y.o. 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 Alexander Fry is here today for follow-up. Bilirubin is now 7.66, ALT 365 and AST 184. Skin is a pale yellow at this time, sclera are jaundiced. He has a diffuse sandpaper rash on his arms, chest, abdomen and back that itches. He has multiple whelps on both sides of his back that look similar to bug bites.  He is feeling fatigued and states that he feels bloated in his abdomen. Since Tuesday his stoll has gotten pale and urine is dark.  He is still off the Votrient and not taking any Decadron.  No fever, n/v, cough, dizziness,vision changes, palpitations or chest pain. He has occasional small headaches that come and go.  No swelling or tenderness in his extremities at this time. No neuropathic pain.  He has a good appetite but states that he does not feel like he is well hydrated despite pushing fluids. His weight is stable.  I called and spoke with Dr. Lindi Adie and then spoke with Dr. Laurena Bering about direct admitting the patient for further work up and GI consult.     ECOG Performance Status: 2 - Symptomatic, <50% confined to bed  Medications:  Allergies as of 04/21/2017   No Known Allergies     Medication List       Accurate as of 04/21/17  4:15 PM. Always use your most recent med list.          tamsulosin 0.4 MG Caps capsule Commonly known as:  FLOMAX Take 1 capsule (0.4 mg total) by mouth daily.       Allergies: No Known Allergies  Past Medical History, Surgical history, Social history, and Family History were reviewed and updated.  Review of Systems: All other 10  point review of systems is negative.   Physical Exam:  weight is 236 lb 11.2 oz (107.4 kg). His oral temperature is 99.2 F (37.3 C). His blood pressure is 127/76 and his pulse is 106 (abnormal). His respiration is 20 and oxygen saturation is 95%.   Wt Readings from Last 3 Encounters:  04/21/17 236 lb 11.2 oz (107.4 kg)  04/19/17 236 lb (107 kg)  04/08/17 232 lb 3.2 oz (105.3 kg)    Ocular: Sclerae icteric, pupils equal, round and reactive to light Ear-nose-throat: Oropharynx clear, dentition fair Lymphatic: No cervical, supraclavicular or axillary adenopathy Lungs no rales or rhonchi, good excursion bilaterally Heart regular rate and rhythm, no murmur appreciated Abd soft, nontender, positive bowel sounds, no l MSK no focal spinal tenderness, no joint edema, still no liver or spleen tip palpated on exam, no fluid wave  Neuro: non-focal, well-oriented, appropriate affect Breasts: Deferred  Lab Results  Component Value Date   WBC 4.9 04/19/2017   HGB 13.1 04/19/2017   HCT 39.4 04/19/2017   MCV 88 04/19/2017   PLT 223 04/19/2017   No results found for: FERRITIN, IRON, TIBC, UIBC, IRONPCTSAT Lab Results  Component Value Date   RBC 4.48 04/19/2017   No results found for: KPAFRELGTCHN, LAMBDASER, KAPLAMBRATIO No results found for: IGGSERUM, IGA, IGMSERUM No results found for: TOTALPROTELP, ALBUMINELP, A1GS, A2GS, BETS, BETA2SER, GAMS, [place], SPEI   Chemistry  Component Value Date/Time   NA 138 04/21/2017 0916   K 3.9 04/21/2017 0916   CL 103 04/19/2017 1421   CO2 26 04/21/2017 0916   BUN 7.8 04/21/2017 0916   CREATININE 0.7 04/21/2017 0916      Component Value Date/Time   CALCIUM 9.0 04/21/2017 0916   ALKPHOS 595 (H) 04/21/2017 0916   AST 184 (HH) 04/21/2017 0916   ALT 365 (HH) 04/21/2017 0916   BILITOT 7.66 ([place]) 04/21/2017 0916      Impression and Plan: Mr. Alexander Fry is a very pleasant 56 yo caucasian gentleman with extensive metastatic renal cell (clear cell)  carcinoma of the right kidney. He is still off the Votrient and bilirubin and LFT's continue to climb. He is becoming jaundiced and having increased fatigue. He has also developed a sandpaper rash on his arms, chest, abdomen and back. Bilirubin is now up to 7.66.  I have updated Dr. Lindi Adie and Hospitalist Service, Dr. Sloan Leiter, is direct admitting the patient at [name].  He is leaving our office now and heading straight over to [place] than 50% of his 25 minute appointment was spent counseling and coordinating his care.  He will contact our office with any questions or concerns. We can certainly see her sooner if need be.   Eliezer Bottom, NP 5/17/20184:15 PM

## 2017-04-21 NOTE — H&P (Addendum)
History and Physical    Elchonon Maxson NWG:[id number] DOB: [birth year] DOA: 04/21/2017    PCP: [name], PA-C  Patient coming from: home  Chief Complaint: sent from cancer center for abnormal labs  HPI: Alexander Fry is a 56 y.o. 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 He has completed radiation to the brain and spine. He has recently been noted to have rising LFTs and Votrient was discontinued as it was suspected to be the cause. He is complaining of fatigue and abdominal bloating. His LFTs are continuing to rise and he was therefore referred for a direct admission. He has had diffuse itching lately and has also developed a rash on his arms, chest and back. Urine is dark in color and stools are light. No abdominal pain. He has had occasional nausea but no vomiting.  He has had chills and night sweats at least twice this week but is not sure if he had a fever. No cough or dysuria.     ED Course: direct admit  Review of Systems:  No weight loss but has a poor appetite over the past week.  Has had dribbling of urine over the past week.  Has lower back pain and occasional tingling in right leg.  Has been having green/yellow discharge from nose since March. No face pain or headaches. All other systems reviewed and apart from HPI, are negative.  Past Medical History:  Diagnosis Date  . Goals of care, counseling/discussion 12/22/2016  . History of radiation therapy 02/11/2017   SRT right posterior frontal 12 mm target 20 Gy, Right anterior frontal 45mm 20 Gy  . History of radiation therapy 02/21/2017   SRT Right post parietal 28mm target 20 Gy, left Post parietal 62mm 20 Gy, Right Occipital 4 mm 20Gy, Left Occipital 4 mm 20 Gy  . History of radiation therapy 02/25/2017, 02/28/2017, 03/02/2017   SRT L1 spine 27 Gy 3 fractions, L4 Spine 27 Gy 3 fractions  . Inguinal hernia of left side without  obstruction or gangrene   . Pneumonia    left lung  . Renal cell carcinoma, right ([place]) 01/05/2017  . 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 His vision has "spots" at times    Past Surgical History:  Procedure Laterality Date  . HERNIA REPAIR    . INGUINAL HERNIA REPAIR Left     Social History:  Reports that he has never smoked. He has never used smokeless tobacco. He reports that he does not drink alcohol. He reports that he does not use drugs. Living with nephew.   No Known Allergies  Family History  Problem Relation Age of Onset  . Heart disease Mother   . Diabetes Mother   . Heart disease Father   . Alcohol abuse Brother      Prior to Admission medications   Medication Sig Start Date End Date Taking? Authorizing Provider  tamsulosin (FLOMAX) 0.4 MG CAPS capsule Take 1 capsule (0.4 mg total) by mouth daily. 12/10/16   [name], PA-C    Physical Exam: Wt Readings from Last 3 Encounters:  04/21/17 104.3 kg (230 lb)  04/21/17 107.4 kg (236 lb 11.2 oz)  04/19/17 107 kg (236 lb)   Vitals:   04/21/17 1640 04/21/17 1643  BP: (!) 108/93   Pulse: (!) 102   Resp: 20   Temp: 100 F (37.8 C)  TempSrc: Oral   SpO2: 94%   Weight:  104.3 kg (230 lb)  Height:  6' (1.829 m)      Constitutional: NAD, calm, comfortable Eyes: PERTLA, lids and conjunctivae normal ENMT: Mucous membranes are moist. Posterior pharynx clear of any exudate or lesions. Normal dentition.  Neck: normal, supple, no masses, no thyromegaly Respiratory: clear to auscultation bilaterally, no wheezing, no crackles. Normal respiratory effort. No accessory muscle use.  Cardiovascular: S1 & S2 heard, regular rate and rhythm, no murmurs / rubs / gallops. No extremity edema. 2+ pedal pulses. No carotid bruits.  Abdomen: No distension, no tenderness, no masses palpated. No hepatosplenomegaly. Bowel sounds normal.  Musculoskeletal: no clubbing / cyanosis. No joint deformity  upper and lower extremities. Good ROM, no contractures. Normal muscle tone.  Skin: no ulcers. No induration. Has a faint maculopapular rash on arms, legs, chest and upper back. He also has a larger maculopapular rash on mostly the right side of his back ( almost looks like Acne) - he states he usually sleeps on this side.  Neurologic: CN 2-12 grossly intact. Sensation intact, DTR normal. Strength 5/5 in all 4 limbs.  Psychiatric: Normal judgment and insight. Alert and oriented x 3. Normal mood.     Labs on Admission: I have personally reviewed following labs and imaging studies  CBC:  Recent Labs Lab 04/19/17 1421  WBC 4.9  NEUTROABS 2.1  HGB 13.1  HCT 39.4  MCV 88  PLT 654   Basic Metabolic Panel:  Recent Labs Lab 04/19/17 1421 04/21/17 0916  NA 137 138  K 3.6 3.9  CL 103  --   CO2 26 26  GLUCOSE 154* 180*  BUN 9 7.8  CREATININE 0.8 0.7  CALCIUM 9.0 9.0   GFR: Estimated Creatinine Clearance: 128.8 mL/min (by C-G formula based on SCr of 0.7 mg/dL). Liver Function Tests:  Recent Labs Lab 04/19/17 1421 04/21/17 0916  AST 167* 184*  ALT 389* 365*  ALKPHOS 406* 595*  BILITOT 4.70* 7.66*  PROT 6.0* 6.5  ALBUMIN 2.7* 2.8*   No results for input(s): LIPASE, AMYLASE in the last 168 hours. No results for input(s): AMMONIA in the last 168 hours. Coagulation Profile: No results for input(s): INR, PROTIME in the last 168 hours. Cardiac Enzymes: No results for input(s): CKTOTAL, CKMB, CKMBINDEX, TROPONINI in the last 168 hours. BNP (last 3 results) No results for input(s): PROBNP in the last 8760 hours. HbA1C: No results for input(s): HGBA1C in the last 72 hours. CBG:  Recent Labs Lab 04/15/17 0822  GLUCAP 103*   Lipid Profile: No results for input(s): CHOL, HDL, LDLCALC, TRIG, CHOLHDL, LDLDIRECT in the last 72 hours. Thyroid Function Tests: No results for input(s): TSH, T4TOTAL, FREET4, T3FREE, THYROIDAB in the last 72 hours. Anemia Panel: No results for  input(s): VITAMINB12, FOLATE, FERRITIN, TIBC, IRON, RETICCTPCT in the last 72 hours. Urine analysis: No results found for: COLORURINE, APPEARANCEUR, LABSPEC, PHURINE, GLUCOSEU, HGBUR, BILIRUBINUR, KETONESUR, PROTEINUR, UROBILINOGEN, NITRITE, LEUKOCYTESUR Sepsis Labs: @LABRCNTIP (procalcitonin:4,lacticidven:4) )No results found for this or any previous visit (from the past 240 hour(s)).   Radiological Exams on Admission: No results found.     Assessment/Plan Principal Problem: Jaundice with itching, clay colors stool, dark urine - will obtain an ultrasound of abdomen - may simply be hepatotoxicity due to Votrient which was discontinued last week - half life is about 30 hrs - question whether he has a metastasis causing an obstruction- 2 lesions noted in liver on PET on 5/11 - - - no  history of gallstones- choledocholithiasis would likely cause pain and vomiting.  - check Hepatitis panel - follow temps due to complaint of chills and sweats    Active Problems: Anorexia - follow - will order Ensure-  Rash - cause uncertain- itching is likely related to hyperbilirubinemia but rash probably has a separate etiology- he is not on any other new medications and Votrient may be the culprit for this as well- not consistent with bedbugs- would order Benardryl for now and follow    Benign prostatic hyperplasia with nocturia - cont Flomax- may need to increase dose and refer to Urology if current symptoms of dribbling continue    Kidney cancer, primary, with metastasis from kidney to other site, right [facility])   Bone metastases, lung, lumbar spine and liver mets  - Votrient on hold - has completed radiation to L spine and brain in March - will add Dr Marin Olp to his treatment team    DVT prophylaxis: Lovenox  Code Status: Full code  Family Communication: nephew- Cheri Rous- [phone] Disposition Plan: follow on med/surg  Consults called: none  Admission status: observation    [name] MD [facility] Pager: [email or web address] Password [email or web address] 7PM-7AM, please contact night-coverage   04/21/2017, 6:04 PM

## 2017-04-21 NOTE — Telephone Encounter (Signed)
Critical Value   Total Bilirubin 7.66 AST 184 ALT [place] NP notified. Orders will be placed.

## 2017-04-22 DIAGNOSIS — N401 Enlarged prostate with lower urinary tract symptoms: Secondary | ICD-10-CM | POA: Diagnosis not present

## 2017-04-22 DIAGNOSIS — T50905A Adverse effect of unspecified drugs, medicaments and biological substances, initial encounter: Secondary | ICD-10-CM | POA: Diagnosis not present

## 2017-04-22 DIAGNOSIS — R21 Rash and other nonspecific skin eruption: Secondary | ICD-10-CM

## 2017-04-22 DIAGNOSIS — R17 Unspecified jaundice: Principal | ICD-10-CM

## 2017-04-22 DIAGNOSIS — R7989 Other specified abnormal findings of blood chemistry: Secondary | ICD-10-CM | POA: Diagnosis not present

## 2017-04-22 DIAGNOSIS — C7951 Secondary malignant neoplasm of bone: Secondary | ICD-10-CM | POA: Diagnosis not present

## 2017-04-22 LAB — COMPREHENSIVE METABOLIC PANEL
ALBUMIN: 2.7 g/dL — AB (ref 3.5–5.0)
ALT: 277 U/L — ABNORMAL HIGH (ref 17–63)
AST: 157 U/L — AB (ref 15–41)
Alkaline Phosphatase: 556 U/L — ABNORMAL HIGH (ref 38–126)
Anion gap: 6 (ref 5–15)
BILIRUBIN TOTAL: 7.7 mg/dL — AB (ref 0.3–1.2)
BUN: 8 mg/dL (ref 6–20)
CO2: 26 mmol/L (ref 22–32)
Calcium: 7.9 mg/dL — ABNORMAL LOW (ref 8.9–10.3)
Chloride: 105 mmol/L (ref 101–111)
Creatinine, Ser: 0.42 mg/dL — ABNORMAL LOW (ref 0.61–1.24)
GFR calc Af Amer: >60 mL/min (ref >60)
GFR calc non Af Amer: >60 mL/min (ref >60)
GLUCOSE: 114 mg/dL — AB (ref 65–99)
POTASSIUM: 3.5 mmol/L (ref 3.5–5.1)
SODIUM: 137 mmol/L (ref 135–145)
TOTAL PROTEIN: 5.8 g/dL — AB (ref 6.5–8.1)

## 2017-04-22 LAB — PROTIME-INR
INR: 0.86
Prothrombin Time: 11.6 seconds (ref 11.4–15.2)

## 2017-04-22 NOTE — Progress Notes (Signed)
PROGRESS NOTE    Alexander Fry   MHD:[id number]  DOB: [birth year]  DOA: 04/21/2017 PCP: Alexander Dredge, PA-C   Brief Narrative:   Alexander Fry is a 56 y.o. 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 He has completed radiation to the brain and spine. He has recently been noted to have rising LFTs and Votrient was discontinued as it was suspected to be the cause. He is complaining of fatigue and abdominal bloating. His LFTs are continuing to rise and he was therefore referred for a direct admission. He has had diffuse itching lately and has also developed a rash on his arms, chest and back. Urine is dark in color and stools are light. No abdominal pain. He has had occasional nausea but no vomiting.  He has had chills and night sweats at least twice this week but is not sure if he had a fever. No cough or dysuria.   Subjective: No new complaints. Abdominal bloating is better and urine is not as dark.   Assessment & Plan:  Principal Problem: Jaundice with itching, clay colors stool, dark urine -   ultrasound of abdomen does not reveal an obstruction- see report below - likely is hepatotoxicity due to Votrient which was discontinued last week   - cont to treat conservatively    Active Problems: Anorexia - follow - will order Ensure- - cont IVF due to poor oral intake  Rash - cause uncertain- itching is likely related to hyperbilirubinemia but rash probably has a separate etiology- he is not on any other new medications and Votrient may be the culprit for this as well- not consistent with bedbugs - rash noted to be improving- cont Benadryl    Benign prostatic hyperplasia with nocturia - cont Flomax- may need to increase dose and refer to Urology if current symptoms of dribbling continue    Kidney cancer, primary, with metastasis from kidney to other site, right [facility])   Bone metastases, lung,  lumbar spine and liver mets  - Votrient on hold - has completed radiation to L spine and brain in March - will add Dr Alexander Fry to his treatment team    DVT prophylaxis: Lovenox  Code Status: Full code  Family Communication: nephewCheri Fry- [phone] Disposition Plan: follow on med/surg  Procedures:     Antimicrobials:  Anti-infectives    None       Objective: Vitals:   04/21/17 1643 04/21/17 2155 04/22/17 0512 04/22/17 1322  BP:  125/78 118/79 126/88  Pulse:  93 93 98  Resp:  20 20 20   Temp:  98.5 F (36.9 C) 98.5 F (36.9 C) 98.9 F (37.2 C)  TempSrc:  Oral Oral Oral  SpO2:  96% 95% 95%  Weight: 104.3 kg (230 lb)     Height: 6' (1.829 m)       Intake/Output Summary (Last 24 hours) at 04/22/17 1408 Last data filed at 04/22/17 1323  Gross per 24 hour  Intake             2115 ml  Output                0 ml  Net             2115 ml   Filed Weights   04/21/17 1643  Weight: 104.3 kg (230 lb)    Examination: General exam: Appears comfortable  HEENT: PERRLA, oral mucosa moist, +  sclera icterus   Respiratory system: Clear to auscultation. Respiratory effort normal. Cardiovascular system: S1 & S2 heard, RRR.  No murmurs  Gastrointestinal system: Abdomen soft, non-tender, nondistended. Normal bowel sound. No organomegaly Central nervous system: Alert and oriented. No focal neurological deficits. Extremities: No cyanosis, clubbing or edema Skin: No rashes or ulcers Psychiatry:  Mood & affect appropriate.     Data Reviewed: I have personally reviewed following labs and imaging studies  CBC:  Recent Labs Lab 04/19/17 1421 04/21/17 1851  WBC 4.9 5.6  NEUTROABS 2.1  --   HGB 13.1 12.6*  HCT 39.4 38.5*  MCV 88 88.1  PLT 223 810   Basic Metabolic Panel:  Recent Labs Lab 04/19/17 1421 04/21/17 0916 04/21/17 1851 04/22/17 0342  NA 137 138  --  137  K 3.6 3.9  --  3.5  CL 103  --   --  105  CO2 26 26  --  26  GLUCOSE 154* 180*  --  114*    BUN 9 7.8  --  8  CREATININE 0.8 0.7 <0.30* 0.42*  CALCIUM 9.0 9.0  --  7.9*   GFR: Estimated Creatinine Clearance: 128.8 mL/min (A) (by C-G formula based on SCr of 0.42 mg/dL (L)). Liver Function Tests:  Recent Labs Lab 04/19/17 1421 04/21/17 0916 04/22/17 0342  AST 167* 184* 157*  ALT 389* 365* 277*  ALKPHOS 406* 595* 556*  BILITOT 4.70* 7.66* 7.7*  PROT 6.0* 6.5 5.8*  ALBUMIN 2.7* 2.8* 2.7*   No results for input(s): LIPASE, AMYLASE in the last 168 hours. No results for input(s): AMMONIA in the last 168 hours. Coagulation Profile:  Recent Labs Lab 04/22/17 1035  INR 0.86   Cardiac Enzymes: No results for input(s): CKTOTAL, CKMB, CKMBINDEX, TROPONINI in the last 168 hours. BNP (last 3 results) No results for input(s): PROBNP in the last 8760 hours. HbA1C: No results for input(s): HGBA1C in the last 72 hours. CBG: No results for input(s): GLUCAP in the last 168 hours. Lipid Profile: No results for input(s): CHOL, HDL, LDLCALC, TRIG, CHOLHDL, LDLDIRECT in the last 72 hours. Thyroid Function Tests: No results for input(s): TSH, T4TOTAL, FREET4, T3FREE, THYROIDAB in the last 72 hours. Anemia Panel: No results for input(s): VITAMINB12, FOLATE, FERRITIN, TIBC, IRON, RETICCTPCT in the last 72 hours. Urine analysis: No results found for: COLORURINE, APPEARANCEUR, LABSPEC, PHURINE, GLUCOSEU, HGBUR, BILIRUBINUR, KETONESUR, PROTEINUR, UROBILINOGEN, NITRITE, LEUKOCYTESUR Sepsis Labs: @LABRCNTIP (procalcitonin:4,lacticidven:4) )No results found for this or any previous visit (from the past 240 hour(s)).       Radiology Studies: US Abdomen Complete  Result Date: 04/21/2017 CLINICAL DATA:  Elevated LFTs. EXAM: ABDOMEN ULTRASOUND COMPLETE COMPARISON:  PET-CT Apr 15, 2017 FINDINGS: Gallbladder: Cholelithiasis is identified with a stone in the neck of the gallbladder. No Murphy's sign, wall thickening, or pericholecystic fluid. Common bile duct: Diameter: 3.2 mm Liver: A mass  in the right hepatic lobe may represent the patient's previously identified metastatic disease measuring 2.5 cm today versus 2.9 cm on the Apr 15, 2017 PET-CT. IVC: No abnormality visualized. Pancreas: Not well visualized due to shadowing bowel gas. Spleen: Size and appearance within normal limits. Right Kidney: Length: 15.3 cm. Known the solid mass in the lower pole consistent with the patient's primary malignancy measuring up to 8 cm today. Left Kidney: Length: 12 cm.  Cyst in the midpole. Abdominal aorta: Obscured by bowel gas. Other findings: None. IMPRESSION: 1. Cholelithiasis. No Murphy's sign, wall thickening, or pericholecystic fluid. 2. Known right hepatic mass, also seen  on previous PET CTs. 3. Known primary malignancy in the lower pole the right kidney. 4. No other acute abnormalities. Electronically Signed   By: Dorise Bullion III M.D   On: 04/21/2017 18:56      Scheduled Meds: . enoxaparin (LOVENOX) injection  40 mg Subcutaneous Q24H  . feeding supplement (ENSURE ENLIVE)  237 mL Oral BID BM  . tamsulosin  0.4 mg Oral Daily   Continuous Infusions: . sodium chloride 1,000 mL (04/22/17 0739)     LOS: 1 day    Time spent in minutes: 35    [name], MD [facility] Pager: [email or web address] Password [email or web address] 04/22/2017, 2:08 PM

## 2017-04-22 NOTE — Consult Note (Signed)
Referring Provider:  Dr. Wynelle Cleveland Primary Care Physician:  [name], PA-C Primary Gastroenterologist:  Dr. Delle Reining in [place]  Reason for Consultation:  Elevated LFT's  HPI: Alexander Fry is a 56 y.o. 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 He has completed radiation to the brain and spine.  Has been taking Votrient for his cancer as well since February.  He has recently been noted to have rising LFTs and Votrient was discontinued as of 5/11 as it was suspected to be the cause since it can cause severe, even fatal hepatotoxicity.  LFTs are continuing to rise and he was therefore referred for a direct admission. He has had diffuse itching lately and has also developed a rash on his arms, chest and back. Urine is dark in color and stools are light. No abdominal pain. He has had occasional nausea but no vomiting.  Complains of fatigue.  Today he feels ok but says that he does not like just laying around here.  LFT's from 3 weeks ago to now as follows:    AST:  55-->245-->167-->184-->157 ALT:  100-->324-->389-->365-->277 ALP:  98-->219-->406-->595-->556 Total bili:  1.0-->1.19-->4.7-->7.66-->7.7  Viral hepatitis panel is pending.   Past Medical History:  Diagnosis Date  . Goals of care, counseling/discussion 12/22/2016  . History of radiation therapy 02/11/2017   SRT right posterior frontal 12 mm target 20 Gy, Right anterior frontal 58mm 20 Gy  . History of radiation therapy 02/21/2017   SRT Right post parietal 3mm target 20 Gy, left Post parietal 32mm 20 Gy, Right Occipital 4 mm 20Gy, Left Occipital 4 mm 20 Gy  . History of radiation therapy 02/25/2017, 02/28/2017, 03/02/2017   SRT L1 spine 27 Gy 3 fractions, L4 Spine 27 Gy 3 fractions  . Inguinal hernia of left side without obstruction or gangrene   . Pneumonia    left lung  . Renal cell carcinoma, right ([place]) 01/05/2017  . Retina  disorder    He had a right torn retina last year. His vision has "spots" at times    Past Surgical History:  Procedure Laterality Date  . HERNIA REPAIR    . INGUINAL HERNIA REPAIR Left     Prior to Admission medications   Medication Sig Start Date End Date Taking? Authorizing Provider  tamsulosin (FLOMAX) 0.4 MG CAPS capsule Take 1 capsule (0.4 mg total) by mouth daily. 12/10/16  Yes [name], PA-C    Current Facility-Administered Medications  Medication Dose Route Frequency Provider Last Rate Last Dose  . 0.9 %  sodium chloride infusion   Intravenous Continuous [name], MD 100 mL/hr at 04/22/17 0739 1,000 mL at 04/22/17 0739  . acetaminophen (TYLENOL) tablet 650 mg  650 mg Oral Q6H PRN [name], MD       Or  . acetaminophen (TYLENOL) suppository 650 mg  650 mg Rectal Q6H PRN [name], MD      . diphenhydrAMINE (BENADRYL) injection 25 mg  25 mg Intravenous Q6H PRN [name], MD   25 mg at 04/22/17 0801  . enoxaparin (LOVENOX) injection 40 mg  40 mg Subcutaneous Q24H [name], MD   40 mg at 04/22/17 0039  . feeding supplement (ENSURE ENLIVE) (ENSURE ENLIVE) liquid 237 mL  237 mL Oral BID BM Rizwan, Saima, MD      . ibuprofen (ADVIL,MOTRIN) tablet 400 mg  400 mg Oral Q6H PRN [name], MD      .  ondansetron (ZOFRAN) tablet 4 mg  4 mg Oral Q6H PRN [name], MD       Or  . ondansetron (ZOFRAN) injection 4 mg  4 mg Intravenous Q6H PRN Rizwan, Eunice Blase, MD      . tamsulosin (FLOMAX) capsule 0.4 mg  0.4 mg Oral Daily [name], MD   0.4 mg at 04/21/17 1946    Allergies as of 04/21/2017  . (No Known Allergies)    Family History  Problem Relation Age of Onset  . Heart disease Mother   . Diabetes Mother   . Heart disease Father   . Alcohol abuse Brother     Social History   Social History  . Marital status: Single    Spouse name: N/A  . Number of children: N/A  . Years of education: N/A   Occupational History  . Not on  file.   Social History Main Topics  . Smoking status: Never Smoker  . Smokeless tobacco: Never Used  . Alcohol use Yes     Comment: social use in the past  . Drug use: No  . Sexual activity: Not Currently   Other Topics Concern  . Not on file   Social History Narrative  . No narrative on file    Review of Systems: ROS is O/W negative except as mentioned in HPI.  Physical Exam: Vital signs in last 24 hours: Temp:  [98.5 F (36.9 C)-100 F (37.8 C)] 98.5 F (36.9 C) (05/18 0512) Pulse Rate:  [93-106] 93 (05/18 0512) Resp:  [20] 20 (05/18 0512) BP: (108-127)/(76-93) 118/79 (05/18 0512) SpO2:  [94 %-96 %] 95 % (05/18 0512) Weight:  [230 lb (104.3 kg)-236 lb 11.2 oz (107.4 kg)] 230 lb (104.3 kg) (05/17 1643) Last BM Date: 04/21/17 General:  Alert, Well-developed, well-nourished, pleasant and cooperative in NAD; jaundice noted. Head:  Normocephalic and atraumatic. Eyes:  Sclerae icteric. Ears:  Normal auditory acuity. Mouth:  No deformity or lesions.   Neck:  Supple; no masses or thyromegaly. Lungs:  Clear throughout to auscultation.  No wheezes, crackles, or rhonchi.  No increased WOB. Heart:  Regular rate and rhythm; no murmurs, clicks, rubs,  or gallops. Abdomen:  Soft, non-distended.  BS present.  Non-tender. Rectal:  Deferred  Msk:  Symmetrical without gross deformities. Pulses:  Normal pulses noted. Extremities:  Without clubbing or edema. Neurologic:  Alert and  oriented x4;  grossly normal neurologically. Skin:  Intact without significant lesions or rashes. Psych:  Alert and cooperative. Normal mood and affect.  Intake/Output from previous day: 05/17 0701 - 05/18 0700 In: 1275 [P.O.:600; I.V.:675] Out: -  Intake/Output this shift: Total I/O In: 480 [P.O.:480] Out: -   Lab Results:  Recent Labs  04/19/17 1421 04/21/17 1851  WBC 4.9 5.6  HGB 13.1 12.6*  HCT 39.4 38.5*  PLT 223 234   BMET  Recent Labs  04/19/17 1421 04/21/17 0916 04/21/17 1851  04/22/17 0342  NA 137 138  --  137  K 3.6 3.9  --  3.5  CL 103  --   --  105  CO2 26 26  --  26  GLUCOSE 154* 180*  --  114*  BUN 9 7.8  --  8  CREATININE 0.8 0.7 <0.30* 0.42*  CALCIUM 9.0 9.0  --  7.9*   LFT  Recent Labs  04/22/17 0342  PROT 5.8*  ALBUMIN 2.7*  AST 157*  ALT 277*  ALKPHOS 556*  BILITOT 7.7*   Studies/Results: US Abdomen Complete  Result Date: 04/21/2017 CLINICAL DATA:  Elevated LFTs. EXAM: ABDOMEN ULTRASOUND COMPLETE COMPARISON:  PET-CT Apr 15, 2017 FINDINGS: Gallbladder: Cholelithiasis is identified with a stone in the neck of the gallbladder. No Murphy's sign, wall thickening, or pericholecystic fluid. Common bile duct: Diameter: 3.2 mm Liver: A mass in the right hepatic lobe may represent the patient's previously identified metastatic disease measuring 2.5 cm today versus 2.9 cm on the Apr 15, 2017 PET-CT. IVC: No abnormality visualized. Pancreas: Not well visualized due to shadowing bowel gas. Spleen: Size and appearance within normal limits. Right Kidney: Length: 15.3 cm. Known the solid mass in the lower pole consistent with the patient's primary malignancy measuring up to 8 cm today. Left Kidney: Length: 12 cm.  Cyst in the midpole. Abdominal aorta: Obscured by bowel gas. Other findings: None. IMPRESSION: 1. Cholelithiasis. No Murphy's sign, wall thickening, or pericholecystic fluid. 2. Known right hepatic mass, also seen on previous PET CTs. 3. Known primary malignancy in the lower pole the right kidney. 4. No other acute abnormalities. Electronically Signed   By: Dorise Bullion III M.D   On: 04/21/2017 18:56   IMPRESSION/PLAN:  -Elevated LFT's:  It is quite convincing that this is secondary to his medication, Votrient.  This can cause a very severe, even fatal, hepatotoxicity.  This was started in February and enzyme elevation started after initiation of that medication.  The effects and enzyme elevation can last a long time.  Unfortunately there is nothing  else to do other than to terminate use of the medication.  We agree with viral hepatitis studies and we are going to check a PT/INR.  Otherwise need to monitor enzymes, coags, and clinical status closely.  We do not think that this is obstructive without some degree of dilation seen on imaging.   ZEHR, JESSICA D.  04/22/2017, 9:25 AM  Pager number [phone]   ________________________________________________________________________  Velora Heckler GI MD note:  I personally examined the patient, reviewed the data and agree with the assessment and plan described above.  Votrient is well described to cause hepatotoxicity and I suspect that is the problem here.  He has no biliary dilation on PET/CT or [name] to suggested biliary obstruction.  Bloodwork for viral hepatitis has been drawn and is pending.   His LFTs may have already peaked however there are documented cases of votrient related hepatic failure.  His INR is normal and he has no signs of encephalopathy.  I don't know of any treatment to speed recovery but suspect that his LFTs will improve over the next week or two from review of the literature.  If oncology agrees, votrient could be tried again (after recovery from this acute episode) at lower dosing and possibly along with steroids.     Owens Loffler, MD [facility] Pager [phone]

## 2017-04-23 DIAGNOSIS — R7989 Other specified abnormal findings of blood chemistry: Secondary | ICD-10-CM | POA: Diagnosis not present

## 2017-04-23 DIAGNOSIS — R17 Unspecified jaundice: Secondary | ICD-10-CM | POA: Diagnosis not present

## 2017-04-23 DIAGNOSIS — C7951 Secondary malignant neoplasm of bone: Secondary | ICD-10-CM | POA: Diagnosis not present

## 2017-04-23 DIAGNOSIS — N401 Enlarged prostate with lower urinary tract symptoms: Secondary | ICD-10-CM | POA: Diagnosis not present

## 2017-04-23 DIAGNOSIS — T50905A Adverse effect of unspecified drugs, medicaments and biological substances, initial encounter: Secondary | ICD-10-CM | POA: Diagnosis not present

## 2017-04-23 LAB — HIV ANTIBODY (ROUTINE TESTING W REFLEX): HIV Screen 4th Generation wRfx: NONREACTIVE

## 2017-04-23 LAB — COMPREHENSIVE METABOLIC PANEL
ALBUMIN: 2.5 g/dL — AB (ref 3.5–5.0)
ALK PHOS: 566 U/L — AB (ref 38–126)
ALT: 216 U/L — AB (ref 17–63)
AST: 120 U/L — ABNORMAL HIGH (ref 15–41)
Anion gap: 7 (ref 5–15)
BILIRUBIN TOTAL: 8.4 mg/dL — AB (ref 0.3–1.2)
BUN: 8 mg/dL (ref 6–20)
CALCIUM: 7.6 mg/dL — AB (ref 8.9–10.3)
CO2: 22 mmol/L (ref 22–32)
CREATININE: 0.31 mg/dL — AB (ref 0.61–1.24)
Chloride: 106 mmol/L (ref 101–111)
GFR calc Af Amer: >60 mL/min (ref >60)
GFR calc non Af Amer: >60 mL/min (ref >60)
Glucose, Bld: 110 mg/dL — ABNORMAL HIGH (ref 65–99)
Potassium: 3.4 mmol/L — ABNORMAL LOW (ref 3.5–5.1)
SODIUM: 135 mmol/L (ref 135–145)
TOTAL PROTEIN: 5.5 g/dL — AB (ref 6.5–8.1)

## 2017-04-23 LAB — HEPATITIS PANEL, ACUTE
HCV Ab: <0.1 s/co ratio (ref 0.0–0.9)
Hep A IgM: NEGATIVE
Hep B C IgM: NEGATIVE
Hepatitis B Surface Ag: NEGATIVE

## 2017-04-23 MED ORDER — DIPHENHYDRAMINE HCL 25 MG PO CAPS: 25.0000 mg | ORAL_CAPSULE | ORAL | 2 refills | Status: DC | PRN

## 2017-04-23 MED ORDER — POTASSIUM CHLORIDE CRYS ER 20 MEQ PO TBCR
40.0000 meq | EXTENDED_RELEASE_TABLET | Freq: Once | ORAL | Status: AC
  Administered 2017-04-23: 40 meq via ORAL
  Filled 2017-04-23: qty 2

## 2017-04-23 NOTE — Discharge Summary (Signed)
Physician Discharge Summary  Alexander Fry JXB:[id number] DOB: 1961-08-25 DOA: 04/21/2017  PCP: [name], PA-C  Admit date: 04/21/2017 Discharge date: 04/23/2017  Admitted From: home  Disposition:  home   Recommendations for Outpatient Follow-up:  1. F/u LFTs later this week 2. He will f/u with Dr Marin Olp later this week  Discharge Condition:  stable   CODE STATUS:  Full code   Consultations:  GI    Discharge Diagnoses:  Principal Problem:   Drug-induced jaundice Active Problems:   Rash and nonspecific skin eruption   Benign prostatic hyperplasia with nocturia   Kidney cancer, primary, with metastasis from kidney to other site, right ([place])   Bone metastases ([place])    Subjective: Itching is still present- abdominal bloating improved and he is eating better today. Urine is still dark and stool is still light in color.   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 He has completed radiation to the brain and spine. He has recently been noted to have rising LFTs and Votrient was discontinued as it was suspected to be the cause. He is complaining of fatigue and abdominal bloating. His LFTs are continuing to rise and he was therefore referred for a direct admission. He has had diffuse itching lately and has also developed a rash on his arms, chest and back. Urine is dark in color and stools are light. No abdominal pain. He has had occasional nausea but no vomiting.  He has had chills and night sweats at least twice this week but is not sure if he had a fever. No cough or dysuria.   Hospital Course:  Principal Problem: Jaundice with itching, clay colors stool, dark urine - ultrasound of abdomen does not reveal an obstruction- see report below - likely is hepatotoxicity due to Votrient which was discontinued last week   - I did request a GI eval and  they feel that this is likely Votrient related as well - cont to treat conservatively   Active Problems: Anorexia - follow - appears to have improved for now- he has eaten all of his breakfast - have stressed the importance of maintaining a balanced diet and addition nutritional supplements if he is losing weight.    Rash - cause uncertain- he is not on any other new medications and Votrient may be the culprit for this as well  - started on PRN Benardryl - rash noted to be improving- cont Benadryl as needed   Benign prostatic hyperplasia with nocturia - cont Flomax- may need to increase dose and refer to Urology if current symptoms of dribbling continue  Kidney cancer, primary, with metastasis from kidney to other site, right [facility]) Bone metastases, lung, lumbar spine and liver mets - Votrient on hold - has completed radiation to L spine and brain in March - will add Dr Marin Olp to his treatment team  Discharge Instructions  Discharge Instructions    Diet general    Complete by:  As directed    Regular diet   Increase activity slowly    Complete by:  As directed      Allergies as of 04/23/2017   No Known Allergies     Medication List    TAKE these medications   diphenhydrAMINE 25 mg capsule Commonly known as:  BENADRYL Take 1-2 capsules (25-50 mg total) by mouth every 4 (four) hours as needed.   tamsulosin 0.4  MG Caps capsule Commonly known as:  FLOMAX Take 1 capsule (0.4 mg total) by mouth daily.       No Known Allergies   Procedures/Studies:    Dg Chest 2 View  Result Date: 04/08/2017 CLINICAL DATA:  Shortness of breath, chest tightness EXAM: CHEST  2 VIEW COMPARISON:  12/09/2016 FINDINGS: Mild cardiomegaly. No effusions or edema. No confluent opacity is. No acute bony abnormality. IMPRESSION: Mild cardiomegaly.  No confluent airspace opacity or acute findings. Electronically Signed   By: Rolm Baptise M.D.   On: 04/08/2017 12:36   Ct Angio Chest  Pe W/cm &/or Wo Cm  Result Date: 04/08/2017 CLINICAL DATA:  Metastatic renal cell carcinoma. Dyspnea and chest pain. Currently undergoing radiation therapy to the brain and lumbar spine. Oral chemotherapy. EXAM: CT ANGIOGRAPHY CHEST WITH CONTRAST TECHNIQUE: Multidetector CT imaging of the chest was performed using the standard protocol during bolus administration of intravenous contrast. Multiplanar CT image reconstructions and MIPs were obtained to evaluate the vascular anatomy. CONTRAST:  100 cc Isovue 370 IV. COMPARISON:  12/10/2016 chest CT.  01/03/2017 PET-CT. FINDINGS: Cardiovascular: The study is high quality for the evaluation of pulmonary embolism. There are no filling defects in the central, lobar, segmental or subsegmental pulmonary artery branches to suggest acute pulmonary embolism. Mildly atherosclerotic nonaneurysmal thoracic aorta. Normal caliber pulmonary arteries. Normal heart size. No significant pericardial fluid/thickening. Left anterior descending coronary atherosclerosis. Mediastinum/Nodes: No discrete thyroid nodules. Unremarkable esophagus. No axillary adenopathy. Subcarinal adenopathy measures 1.9 cm (series 5/ image 44), decreased from 2.7 cm on 12/10/2016. Left paratracheal 2.0 cm enlarged node (series 5/image 27), previously 1.9 cm, not appreciably changed. Enlarged AP window 2.1 cm node (series 5/image 38) is stable. Enlarged 1.7 cm right prevascular node (series 5/image 42) is stable. Bilateral hilar adenopathy is mildly decreased, for example 2.5 cm on the right (series 5/image 39) is decreased from 3.1 cm and 1.6 cm on the left (series 5/image 48) is decreased from 1.9 cm. Lungs/Pleura: No pneumothorax. No pleural effusion. Previously visualized numerous bilateral pulmonary nodules have significantly decreased in size or resolved. For example the previously visualized 2.6 x 1.3 cm right lower lobe pulmonary nodule is absent on this scan. Previously visualized 1.7 cm superior segment  left lower lobe pulmonary nodule is stable in size although now predominantly cavitary on series 8/image 31. Previously visualized apical left upper lobe 1.4 cm solid pulmonary nodule is decreased to 0.5 cm (series 8/ image 23). There is new extensive patchy ground-glass opacity throughout both lungs. Upper abdomen: Low-attenuation 2.8 cm right liver lobe lesion (series 5/ image 75) is slightly decreased from 3.1 cm on 01/03/2017. Cholelithiasis. Musculoskeletal: No aggressive appearing focal osseous lesions. Mild thoracic spondylosis. Review of the MIP images confirms the above findings. IMPRESSION: 1. No pulmonary embolism. 2. New extensive patchy ground-glass opacity throughout both lungs. Pulmonary drug toxicity is a leading consideration. Atypical infection is less likely. 3. Mediastinal and bilateral hilar adenopathy is decreased. Pulmonary metastases are significantly decreased. Right liver lobe metastasis is mildly decreased. 4. Additional findings include aortic atherosclerosis, 1 vessel coronary atherosclerosis and cholelithiasis. Electronically Signed   By: Ilona Sorrel M.D.   On: 04/08/2017 15:13   US Abdomen Complete  Result Date: 04/21/2017 CLINICAL DATA:  Elevated LFTs. EXAM: ABDOMEN ULTRASOUND COMPLETE COMPARISON:  PET-CT Apr 15, 2017 FINDINGS: Gallbladder: Cholelithiasis is identified with a stone in the neck of the gallbladder. No Murphy's sign, wall thickening, or pericholecystic fluid. Common bile duct: Diameter: 3.2 mm Liver: A mass in the  right hepatic lobe may represent the patient's previously identified metastatic disease measuring 2.5 cm today versus 2.9 cm on the Apr 15, 2017 PET-CT. IVC: No abnormality visualized. Pancreas: Not well visualized due to shadowing bowel gas. Spleen: Size and appearance within normal limits. Right Kidney: Length: 15.3 cm. Known the solid mass in the lower pole consistent with the patient's primary malignancy measuring up to 8 cm today. Left Kidney: Length:  12 cm.  Cyst in the midpole. Abdominal aorta: Obscured by bowel gas. Other findings: None. IMPRESSION: 1. Cholelithiasis. No Murphy's sign, wall thickening, or pericholecystic fluid. 2. Known right hepatic mass, also seen on previous PET CTs. 3. Known primary malignancy in the lower pole the right kidney. 4. No other acute abnormalities. Electronically Signed   By: Dorise Bullion III M.D   On: 04/21/2017 18:56   Nm Pet Image Restag (ps) Skull Base To Thigh  Result Date: 04/15/2017 CLINICAL DATA:  Subsequent treatment strategy for metastatic kidney cancer, on chemotherapy. EXAM: NUCLEAR MEDICINE PET SKULL BASE TO THIGH TECHNIQUE: 11.3 mCi F-18 FDG was injected intravenously. Full-ring PET imaging was performed from the skull base to thigh after the radiotracer. CT data was obtained and used for attenuation correction and anatomic localization. FASTING BLOOD GLUCOSE:  Value: 103 mg/dl COMPARISON:  PET-CT dated 01/03/2017 FINDINGS: NECK No hypermetabolic lymph nodes in the neck. CHEST Bilateral pulmonary metastases. For example, there is a 1.4 cm subpleural left upper lobe nodule (series 8/image 19, max SUV 4.9, previously 10 mm with max SUV 5.4. Index 17 x 5 mm pleural-based nodule in the left lower hemithorax (series 4/image 94), max SUV 2.9, previously 15.2. Associated thoracic nodal metastases, including a 16 mm short axis left paratracheal node (series 4/ image 63), max SUV 12.9, previously 13.9. Index 16 mm subcarinal node with max SUV 8.7, previously 2.5 cm with max SUV 14.5. ABDOMEN/PELVIS 2.9 cm hypodense lesion in segment 7 of the liver (series 4/image 98), non FDG avid, previously 3.1 cm with max SUV 7.0. 6.8 x 8.0 cm right lower pole renal mass, max SUV 6.5, previously 11.5 x 8.3 cm with max SUV 16.0 (when excluding the renal collecting system). 13 mm short axis aortocaval node (series 4/image 142) with max SUV 7.3, previously 15 mm with max SUV 16.8. No abnormal hypermetabolic activity within the  pancreas, adrenal glands, or spleen. SKELETON Improving osseous metastases in the right L1 vertebral body (series 4/ image 122), right L4 vertebral body/transverse process (series 4/ image 147), and right iliac bone (series 4/ image 172), max SUV 10.9, previously 16.5. IMPRESSION: Partial metabolic response, as above. 6.8 x 8.0 cm right lower pole renal mass, corresponding to known renal cell carcinoma. Mediastinal and aortocaval nodal metastases, improved. Bilateral pulmonary metastases and pleural-based metastasis in the left lower hemithorax, improved. Right liver metastasis is no longer FDG avid. Improving osseous metastases in the lumbar spine and right iliac bone. Electronically Signed   By: Julian Hy M.D.   On: 04/15/2017 11:35       Discharge Exam: Vitals:   04/22/17 2142 04/23/17 0509  BP: 123/77 131/77  Pulse: 97 92  Resp: 18 18  Temp: 99 F (37.2 C) 99 F (37.2 C)   Vitals:   04/22/17 0512 04/22/17 1322 04/22/17 2142 04/23/17 0509  BP: 118/79 126/88 123/77 131/77  Pulse: 93 98 97 92  Resp: 20 20 18 18   Temp: 98.5 F (36.9 C) 98.9 F (37.2 C) 99 F (37.2 C) 99 F (37.2 C)  TempSrc: Oral Oral  Oral Oral  SpO2: 95% 95% 95% 97%  Weight:      Height:        General: Pt is alert, awake, not in acute distress Cardiovascular: RRR, S1/S2 +, no rubs, no gallops Respiratory: CTA bilaterally, no wheezing, no rhonchi Abdominal: Soft, NT, ND, bowel sounds + Extremities: no edema, no cyanosis    The results of significant diagnostics from this hospitalization (including imaging, microbiology, ancillary and laboratory) are listed below for reference.     Microbiology: No results found for this or any previous visit (from the past 240 hour(s)).   Labs: BNP (last 3 results) No results for input(s): BNP in the last 8760 hours. Basic Metabolic Panel:  Recent Labs Lab 04/19/17 1421 04/21/17 0916 04/21/17 1851 04/22/17 0342 04/23/17 0428  NA 137 138  --  137 135   K 3.6 3.9  --  3.5 3.4*  CL 103  --   --  105 106  CO2 26 26  --  26 22  GLUCOSE 154* 180*  --  114* 110*  BUN 9 7.8  --  8 8  CREATININE 0.8 0.7 <0.30* 0.42* 0.31*  CALCIUM 9.0 9.0  --  7.9* 7.6*   Liver Function Tests:  Recent Labs Lab 04/19/17 1421 04/21/17 0916 04/22/17 0342 04/23/17 0428  AST 167* 184* 157* 120*  ALT 389* 365* 277* 216*  ALKPHOS 406* 595* 556* 566*  BILITOT 4.70* 7.66* 7.7* 8.4*  PROT 6.0* 6.5 5.8* 5.5*  ALBUMIN 2.7* 2.8* 2.7* 2.5*   No results for input(s): LIPASE, AMYLASE in the last 168 hours. No results for input(s): AMMONIA in the last 168 hours. CBC:  Recent Labs Lab 04/19/17 1421 04/21/17 1851  WBC 4.9 5.6  NEUTROABS 2.1  --   HGB 13.1 12.6*  HCT 39.4 38.5*  MCV 88 88.1  PLT 223 234   Cardiac Enzymes: No results for input(s): CKTOTAL, CKMB, CKMBINDEX, TROPONINI in the last 168 hours. BNP: Invalid input(s): POCBNP CBG: No results for input(s): GLUCAP in the last 168 hours. D-Dimer No results for input(s): DDIMER in the last 72 hours. Hgb A1c No results for input(s): HGBA1C in the last 72 hours. Lipid Profile No results for input(s): CHOL, HDL, LDLCALC, TRIG, CHOLHDL, LDLDIRECT in the last 72 hours. Thyroid function studies No results for input(s): TSH, T4TOTAL, T3FREE, THYROIDAB in the last 72 hours.  Invalid input(s): FREET3 Anemia work up No results for input(s): VITAMINB12, FOLATE, FERRITIN, TIBC, IRON, RETICCTPCT in the last 72 hours. Urinalysis No results found for: COLORURINE, APPEARANCEUR, LABSPEC, [place], GLUCOSEU, HGBUR, BILIRUBINUR, KETONESUR, PROTEINUR, UROBILINOGEN, NITRITE, LEUKOCYTESUR Sepsis Labs Invalid input(s): PROCALCITONIN,  WBC,  LACTICIDVEN Microbiology No results found for this or any previous visit (from the past 240 hour(s)).   Time coordinating discharge: Over 30 minutes  SIGNED:   [name], MD  [facility] 04/23/2017, 4:07 PM Pager   If 7PM-7AM, please contact  night-coverage [email or web address] Password [email or web address]

## 2017-04-23 NOTE — Discharge Instructions (Signed)
Avoid Tylenol/ Acetaminophen.   Please take all your medications with you for your next visit with your Primary MD. Please request your Primary MD to go over all hospital test results at the follow up. Please ask your Primary MD to get all Hospital records sent to his/her office.  If you experience worsening of your admission symptoms, develop shortness of breath, chest pain, suicidal or homicidal thoughts or a life threatening emergency, you must seek medical attention immediately by calling 911 or calling your MD.  Dennis Bast must read the complete instructions/literature along with all the possible adverse reactions/side effects for all the medicines you take including new medications that have been prescribed to you. Take new medicines after you have completely understood and accpet all the possible adverse reactions/side effects.   Do not drive when taking pain medications or sedatives.    Do not take more than prescribed Pain, Sleep and Anxiety Medications  If you have smoked or chewed Tobacco in the last 2 yrs please stop. Stop any regular alcohol and or recreational drug use.  Wear Seat belts while driving.

## 2017-04-23 NOTE — Progress Notes (Signed)
[facility] Progress Note    Since last GI note: Feeling overall well, eating well, no abd pains. Mild itching, seems better with benedryl and when room is cool.  Objective: Vital signs in last 24 hours: Temp:  [98.9 F (37.2 C)-99 F (37.2 C)] 99 F (37.2 C) (05/19 0509) Pulse Rate:  [92-98] 92 (05/19 0509) Resp:  [18-20] 18 (05/19 0509) BP: (123-131)/(77-88) 131/77 (05/19 0509) SpO2:  [95 %-97 %] 97 % (05/19 0509) Last BM Date: 04/21/17 General: alert and oriented times 3 Heart: regular rate and rythm Abdomen: soft, non-tender, non-distended, normal bowel sounds   Lab Results:  Recent Labs  04/21/17 1851  WBC 5.6  HGB 12.6*  PLT 234  MCV 88.1    Recent Labs  04/21/17 0916 04/21/17 1851 04/22/17 0342 04/23/17 0428  NA 138  --  137 135  K 3.9  --  3.5 3.4*  CL  --   --  105 106  CO2 26  --  26 22  GLUCOSE 180*  --  114* 110*  BUN 7.8  --  8 8  CREATININE 0.7 <0.30* 0.42* 0.31*  CALCIUM 9.0  --  7.9* 7.6*    Recent Labs  04/21/17 0916 04/22/17 0342 04/23/17 0428  PROT 6.5 5.8* 5.5*  ALBUMIN 2.8* 2.7* 2.5*  AST 184* 157* 120*  ALT 365* 277* 216*  ALKPHOS 595* 556* 566*  BILITOT 7.66* 7.7* 8.4*    Recent Labs  04/22/17 1035  INR 0.86   5/18 HIV and Acute viral hepatitis panel were both negative   Medications: Scheduled Meds: . enoxaparin (LOVENOX) injection  40 mg Subcutaneous Q24H  . feeding supplement (ENSURE ENLIVE)  237 mL Oral BID BM  . potassium chloride  40 mEq Oral Once  . tamsulosin  0.4 mg Oral Daily   Continuous Infusions: . sodium chloride 100 mL/hr at 04/23/17 0535   PRN Meds:.acetaminophen **OR** acetaminophen, diphenhydrAMINE, ibuprofen, ondansetron **OR** ondansetron (ZOFRAN) IV    Assessment/Plan: 56 y.o. male drug induced liver injury (DILI);   Votrient is well described to cause hepatotoxicity and I suspect that is the problem here.  He has no biliary dilation on PET/CT or [name] to suggested biliary  obstruction.  Bloodwork for viral hepatitis and HIV were both negative. No further testing from is necessary from my standpoint.  He has no sign of liver decompensation, is eating well and not debilitated by itching. I think he is safe for d/c and follow up with Dr. Marin Olp in next week or two.  He should have LFTs rechecked next week sometime.  He knows to be on alert for signs of encephalpathy, significant worsening of his jaundice, severe abd pains.  Please call or page with any further questions or concerns.   Milus Banister, MD  04/23/2017, 8:29 AM [facility] Pager [phone]

## 2017-04-25 ENCOUNTER — Ambulatory Visit (HOSPITAL_BASED_OUTPATIENT_CLINIC_OR_DEPARTMENT_OTHER): Payer: BLUE CROSS/BLUE SHIELD | Admitting: Family

## 2017-04-25 ENCOUNTER — Other Ambulatory Visit: Payer: Self-pay | Admitting: Family

## 2017-04-25 ENCOUNTER — Ambulatory Visit (HOSPITAL_BASED_OUTPATIENT_CLINIC_OR_DEPARTMENT_OTHER): Payer: BLUE CROSS/BLUE SHIELD

## 2017-04-25 VITALS — BP 112/80 | HR 103 | Temp 98.8°F | Resp 20 | Wt 235.8 lb

## 2017-04-25 DIAGNOSIS — R17 Unspecified jaundice: Secondary | ICD-10-CM | POA: Diagnosis not present

## 2017-04-25 DIAGNOSIS — R21 Rash and other nonspecific skin eruption: Secondary | ICD-10-CM | POA: Diagnosis not present

## 2017-04-25 DIAGNOSIS — C641 Malignant neoplasm of right kidney, except renal pelvis: Secondary | ICD-10-CM

## 2017-04-25 DIAGNOSIS — R5383 Other fatigue: Secondary | ICD-10-CM

## 2017-04-25 LAB — COMPREHENSIVE METABOLIC PANEL (CC13)
ALT: 290 IU/L — AB (ref 0–44)
AST: 220 IU/L — AB (ref 0–40)
Albumin, Serum: 3.2 g/dL — ABNORMAL LOW (ref 3.5–5.5)
Albumin/Globulin Ratio: 1.1 — ABNORMAL LOW (ref 1.2–2.2)
Alkaline Phosphatase, S: 923 IU/L — ABNORMAL HIGH (ref 39–117)
BILIRUBIN TOTAL: 12.9 mg/dL — AB (ref 0.0–1.2)
BUN / CREAT RATIO: 15 (ref 9–20)
BUN: 9 mg/dL (ref 6–24)
CREATININE: 0.6 mg/dL — AB (ref 0.76–1.27)
Calcium, Ser: 8.5 mg/dL — ABNORMAL LOW (ref 8.7–10.2)
Carbon Dioxide, Total: 24 mmol/L (ref 18–29)
Chloride, Ser: 101 mmol/L (ref 96–106)
GFR calc Af Amer: 130 mL/min/{1.73_m2} (ref >59)
GFR calc non Af Amer: 112 mL/min/{1.73_m2} (ref >59)
GLOBULIN, TOTAL: 3 g/dL (ref 1.5–4.5)
GLUCOSE: 109 mg/dL — AB (ref 65–99)
Potassium, Ser: 3.9 mmol/L (ref 3.5–5.2)
Sodium: 132 mmol/L — ABNORMAL LOW (ref 134–144)
Total Protein: 6.2 g/dL (ref 6.0–8.5)

## 2017-04-25 MED ORDER — SODIUM CHLORIDE 0.9 % IV SOLN
1000.0000 mL | Freq: Once | INTRAVENOUS | Status: DC
  Administered 2017-04-25: 1000 mL via INTRAVENOUS

## 2017-04-25 MED ORDER — CHOLESTYRAMINE 4 G PO PACK: 4.0000 g | PACK | Freq: Three times a day (TID) | ORAL | 1 refills | Status: DC

## 2017-04-25 NOTE — Patient Instructions (Signed)
Dehydration, Adult Dehydration is a condition in which there is not enough fluid or water in the body. This happens when you lose more fluids than you take in. Important organs, such as the kidneys, brain, and heart, cannot function without a proper amount of fluids. Any loss of fluids from the body can lead to dehydration. Dehydration can range from mild to severe. This condition should be treated right away to prevent it from becoming severe. What are the causes? This condition may be caused by:  Vomiting.  Diarrhea.  Excessive sweating, such as from heat exposure or exercise.  Not drinking enough fluid, especially:  When ill.  While doing activity that requires a lot of energy.  Excessive urination.  Fever.  Infection.  Certain medicines, such as medicines that cause the body to lose excess fluid (diuretics).  Inability to access safe drinking water.  Reduced physical ability to get adequate water and food. What increases the risk? This condition is more likely to develop in people:  Who have a poorly controlled long-term (chronic) illness, such as diabetes, heart disease, or kidney disease.  Who are age 65 or older.  Who are disabled.  Who live in a place with high altitude.  Who play endurance sports. What are the signs or symptoms? Symptoms of mild dehydration may include:   Thirst.  Dry lips.  Slightly dry mouth.  Dry, warm skin.  Dizziness. Symptoms of moderate dehydration may include:   Very dry mouth.  Muscle cramps.  Dark urine. Urine may be the color of tea.  Decreased urine production.  Decreased tear production.  Heartbeat that is irregular or faster than normal (palpitations).  Headache.  Light-headedness, especially when you stand up from a sitting position.  Fainting (syncope). Symptoms of severe dehydration may include:   Changes in skin, such as:  Cold and clammy skin.  Blotchy (mottled) or pale skin.  Skin that does  not quickly return to normal after being lightly pinched and released (poor skin turgor).  Changes in body fluids, such as:  Extreme thirst.  No tear production.  Inability to sweat when body temperature is high, such as in hot weather.  Very little urine production.  Changes in vital signs, such as:  Weak pulse.  Pulse that is more than 100 beats a minute when sitting still.  Rapid breathing.  Low blood pressure.  Other changes, such as:  Sunken eyes.  Cold hands and feet.  Confusion.  Lack of energy (lethargy).  Difficulty waking up from sleep.  Short-term weight loss.  Unconsciousness. How is this diagnosed? This condition is diagnosed based on your symptoms and a physical exam. Blood and urine tests may be done to help confirm the diagnosis. How is this treated? Treatment for this condition depends on the severity. Mild or moderate dehydration can often be treated at home. Treatment should be started right away. Do not wait until dehydration becomes severe. Severe dehydration is an emergency and it needs to be treated in a hospital. Treatment for mild dehydration may include:   Drinking more fluids.  Replacing salts and minerals in your blood (electrolytes) that you may have lost. Treatment for moderate dehydration may include:   Drinking an oral rehydration solution (ORS). This is a drink that helps you replace fluids and electrolytes (rehydrate). It can be found at pharmacies and retail stores. Treatment for severe dehydration may include:   Receiving fluids through an IV tube.  Receiving an electrolyte solution through a feeding tube that is   passed through your nose and into your stomach (nasogastric tube, or NG tube).  Correcting any abnormalities in electrolytes.  Treating the underlying cause of dehydration. Follow these instructions at home:  If directed by your health care provider, drink an ORS:  Make an ORS by following instructions on the  package.  Start by drinking small amounts, about  cup (120 mL) every 5-10 minutes.  Slowly increase how much you drink until you have taken the amount recommended by your health care provider.  Drink enough clear fluid to keep your urine clear or pale yellow. If you were told to drink an ORS, finish the ORS first, then start slowly drinking other clear fluids. Drink fluids such as:  Water. Do not drink only water. Doing that can lead to having too little salt (sodium) in the body (hyponatremia).  Ice chips.  Fruit juice that you have added water to (diluted fruit juice).  Low-calorie sports drinks.  Avoid:  Alcohol.  Drinks that contain a lot of sugar. These include high-calorie sports drinks, fruit juice that is not diluted, and soda.  Caffeine.  Foods that are greasy or contain a lot of fat or sugar.  Take over-the-counter and prescription medicines only as told by your health care provider.  Do not take sodium tablets. This can lead to having too much sodium in the body (hypernatremia).  Eat foods that contain a healthy balance of electrolytes, such as bananas, oranges, potatoes, tomatoes, and spinach.  Keep all follow-up visits as told by your health care provider. This is important. Contact a health care provider if:  You have abdominal pain that:  Gets worse.  Stays in one area (localizes).  You have a rash.  You have a stiff neck.  You are more irritable than usual.  You are sleepier or more difficult to wake up than usual.  You feel weak or dizzy.  You feel very thirsty.  You have urinated only a small amount of very dark urine over 6-8 hours. Get help right away if:  You have symptoms of severe dehydration.  You cannot drink fluids without vomiting.  Your symptoms get worse with treatment.  You have a fever.  You have a severe headache.  You have vomiting or diarrhea that:  Gets worse.  Does not go away.  You have blood or green matter  (bile) in your vomit.  You have blood in your stool. This may cause stool to look black and tarry.  You have not urinated in 6-8 hours.  You faint.  Your heart rate while sitting still is over 100 beats a minute.  You have trouble breathing. This information is not intended to replace advice given to you by your health care provider. Make sure you discuss any questions you have with your health care provider. Document Released: 11/22/2005 Document Revised: 06/18/2016 Document Reviewed: 01/16/2016 Elsevier Interactive Patient Education  2017 [employer]  

## 2017-04-25 NOTE — Progress Notes (Signed)
Hematology and Oncology Follow Up Visit  Alexander Fry [id number] 1961-02-22 56 y.o. 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 He was admitted Thursday of last week and discharged on Saturday after receiving fluids. His skin today is jaundiced and sclera are icteric. He still has the diffuse maculopapular rash all over. He does not feel that benadryl is helping with the itching.   His Bilirubin is now up to 12.7, Alk/Phos is 764, ALT 272 and AST 227.  He still has some fatigue.  No fever, chills, n/v, cough, rash, dizziness, SOB, chest pain, palpitations, abdominal pain or changes in bowel or bladder habits. He feels that his urine is not as dark but stool is still pale.  No swelling, tenderness, numbness or tingling in his extremities. No c/o pain at this time.  He is eating well but admits that he is not hydrating as much as he should. His weight is stable.   ECOG Performance Status: 1 - Symptomatic but completely ambulatory  Medications:  Allergies as of 04/25/2017   No Known Allergies     Medication List       Accurate as of 04/25/17  2:23 PM. Always use your most recent med list.          diphenhydrAMINE 25 mg capsule Commonly known as:  BENADRYL Take 1-2 capsules (25-50 mg total) by mouth every 4 (four) hours as needed.   tamsulosin 0.4 MG Caps capsule Commonly known as:  FLOMAX Take 1 capsule (0.4 mg total) by mouth daily.       Allergies: No Known Allergies  Past Medical History, Surgical history, Social history, and Family History were reviewed and updated.  Review of Systems: All other 10 point review of systems is negative.     Physical Exam:  vitals were not taken for this visit.  Wt Readings from Last 3 Encounters:  04/21/17 230 lb (104.3 kg)  04/21/17 236 lb 11.2 oz (107.4 kg)  04/19/17 236 lb (107 kg)    Ocular: Sclerae unicteric, pupils equal, round and reactive to light Ear-nose-throat: Oropharynx clear, dentition fair Lymphatic: No cervical, supraclavicular or axillary adenopathy Lungs no rales or rhonchi, good excursion bilaterally Heart regular rate and rhythm, no murmur appreciated Abd soft, nontender, positive bowel sounds, no liver or spleen tip palpated on exam, no fluid wave MSK no focal spinal tenderness, no joint edema Neuro: non-focal, well-oriented, appropriate affect Breasts: Deferred  Lab Results  Component Value Date   WBC 5.6 04/21/2017   HGB 12.6 (L) 04/21/2017   HCT 38.5 (L) 04/21/2017   MCV 88.1 04/21/2017   PLT 234 04/21/2017   No results found for: FERRITIN, IRON, TIBC, UIBC, IRONPCTSAT Lab Results  Component Value Date   RBC 4.37 04/21/2017   No results found for: KPAFRELGTCHN, LAMBDASER, KAPLAMBRATIO No results found for: IGGSERUM, IGA, IGMSERUM No results found for: Ronnald Ramp, A1GS, A2GS, Tillman Sers, SPEI   Chemistry      Component Value Date/Time   NA 135 04/23/2017 0428   NA 138 04/21/2017 0916   K 3.4 (L) 04/23/2017 0428   K 3.9 04/21/2017 0916   CL 106 04/23/2017 0428   CL 103 04/19/2017  1421   CO2 22 04/23/2017 0428   CO2 26 04/21/2017 0916   BUN 8 04/23/2017 0428   BUN 7.8 04/21/2017 0916   CREATININE 0.31 (L) 04/23/2017 0428   CREATININE 0.7 04/21/2017 0916      Component Value Date/Time   CALCIUM 7.6 (L) 04/23/2017 0428   CALCIUM 9.0 04/21/2017 0916   ALKPHOS 566 (H) 04/23/2017 0428   ALKPHOS 595 (H) 04/21/2017 0916   AST 120 (H) 04/23/2017 0428   AST 184 (HH) 04/21/2017 0916   ALT 216 (H) 04/23/2017 0428   ALT 365 (HH) 04/21/2017 0916   BILITOT 8.4 (H) 04/23/2017 0428   BILITOT 7.66 ([place]) 04/21/2017 0916       Impression and Plan: Alexander Fry is a very pleasant caucasian male with extensive metastatic renal call (clear cell) carcinoma of the right kidney. He was hospitalized over the weekend due to the elevated bilirubin and LFT's. He is still jaundiced, has the rash and feels fatigued at times. His counts continue to climb. We will have to vigilant in following these. We will give him fluids today and have him start Cholestyramine PO TID for the rash.  We will plan to see him back on Wednesday for repeat lab work and fluids. He will contact us with any questions or concerns. We can certainly bring him back in tomorrow if needed.   Eliezer Bottom, NP 5/21/20182:23 PM

## 2017-04-28 ENCOUNTER — Ambulatory Visit: Payer: BLUE CROSS/BLUE SHIELD | Admitting: Physical Therapy

## 2017-04-28 DIAGNOSIS — M6281 Muscle weakness (generalized): Secondary | ICD-10-CM | POA: Diagnosis not present

## 2017-04-28 DIAGNOSIS — M5441 Lumbago with sciatica, right side: Secondary | ICD-10-CM

## 2017-04-28 DIAGNOSIS — G8929 Other chronic pain: Secondary | ICD-10-CM

## 2017-04-28 NOTE — Therapy (Signed)
[place], [place], [address] Phone: [phone]   Fax:  [phone]  Physical Therapy Treatment  Patient Details  Name: Alexander Fry MRN: [id number] Date of Birth: October 13, 1961 Referring Provider: Dr. Isidore Moos   Encounter Date: 04/28/2017      PT End of Session - 04/28/17 1239    Visit Number 2   Number of Visits 5   Date for PT Re-Evaluation 05/20/17   PT Start Time 1020   PT Stop Time 1100   PT Time Calculation (min) 40 min   Activity Tolerance Patient tolerated treatment well   Behavior During Therapy [facility] for tasks assessed/performed      Past Medical History:  Diagnosis Date  . Goals of care, counseling/discussion 12/22/2016  . History of radiation therapy 02/11/2017   SRT right posterior frontal 12 mm target 20 Gy, Right anterior frontal 8m 20 Gy  . History of radiation therapy 02/21/2017   SRT Right post parietal 475mtarget 20 Gy, left Post parietal 82m39m0 Gy, Right Occipital 4 mm 20Gy, Left Occipital 4 mm 20 Gy  . History of radiation therapy 02/25/2017, 02/28/2017, 03/02/2017   SRT L1 spine 27 Gy 3 fractions, L4 Spine 27 Gy 3 fractions  . Inguinal hernia of left side without obstruction or gangrene   . Pneumonia    left lung  . Renal cell carcinoma, right ([facility]/31/2018  . 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 His vision has "spots" at times    Past Surgical History:  Procedure Laterality Date  . HERNIA REPAIR    . INGUINAL HERNIA REPAIR Left     There were no vitals filed for this visit.      Subjective Assessment - 04/28/17 1023    Subjective "I was in the hospital Thursday, Friday and Saturday"  He has been itching all over   pt states he is feeling fatigued    Pertinent History Right kidney tumor diagnosed in Jan 18. He has metastatic lesions in lumbar spine with right sided leg pain  with radiation and brain with sterotactic radiation in March 2018, with  possible pneumonitis that may be a consequence from stepping down from a steriod  Remote his of back injury.  In 2015 weight 310# but lost >75 pounds with diet and exercise.    Patient Stated Goals To stengthen my upper thigh muscles ( weakness from steroids and treatments )    Currently in Pain? No/denies  I feel that spot in my back             [facility] Assessment - 04/28/17 0001      Observation/Other Assessments   Observations pt with visible jaundice and dry skin      6 Minute Walk- Baseline   6 Minute Walk- Baseline yes   HR (bpm) 106   02 Sat (%RA) 97 %   Modified Borg Scale for Dyspnea 0- Nothing at all   Perceived Rate of Exertion (Borg) 6-     6 Minute walk- Post Test   6 Minute Walk Post Test yes   HR (bpm) 90   02 Sat (%RA) 94 %   Modified Borg Scale for Dyspnea 2- Mild shortness of breath   Perceived Rate of Exertion (Borg) 9- very light     6 minute walk test results    Aerobic Endurance Distance Walked [phone] additional comments pt was able to maintain even pace  and required no rest stops                      [facility] Adult PT Treatment/Exercise - 04/28/17 0001      Self-Care   Self-Care Other Self-Care Comments   Other Self-Care Comments  educated in posture and body mechanincs for back protection. pt expressed understanding      Lumbar Exercises: Supine   Ab Set 5 reps   AB Set Limitations with pelvic floor activation    Clam 10 reps   Clam Limitations unilateral and bilateral with cues to engage abdominls.  also did with green theraband for home    Heel Slides 5 reps   Heel Slides Limitations  on each leg with cues for abdominal activation    Bent Knee Raise 5 reps   Bent Knee Raise Limitations for each leg with cues for core activaiton                 PT Education - 04/28/17 1238    Education provided Yes   Education Details posture and body mechanics eduction supine abdominal and hip exercise    Person(s) Educated  Patient   Methods Explanation;Handout   Comprehension Verbalized understanding;Returned demonstration                [facility] Goals - 04/28/17 1243      CC Long Term Goal  #1   Title Patient will be independent in an exercise program using body weight for strenthening lower extremities    Time 4   Period Weeks   Status On-going     CC Long Term Goal  #2   Title Pt will report decrease in pain and feelings of weakness in his legs by 50%    Time 4   Period Weeks   Status On-going     CC Long Term Goal  #3   Title Pt will state he has an understanding of good body mechanics so he can return to work and protect his back from injury    Status Achieved            Plan - 04/28/17 1240    Clinical Impression Statement Pt has had a hospitalizationa and is struggling with increased bilirubin levels with jaundice, generalized itching and fatigue.  He did well with 6 minute walk test and beginning core exercise today and has met goal for body mechanics education.  Encouraged pt to walk as much as possible to increase his general strength to previous level.    Rehab Potential Excellent   Clinical Impairments Affecting Rehab Potential metastatic cancer to bone and brain   PT Frequency 1x / week   PT Duration 4 weeks   PT Treatment/Interventions ADLs/Self Care Home Management;Patient/family education;Therapeutic exercise;Neuromuscular re-education   PT Next Visit Plan HEP for sidelying hip abductiona, repeated sit to stand and standing hip strenthening,  do standing low rows with blue theraband and progress to other core and leg strenthening. Assess back pain    Consulted and Agree with Plan of Care Patient      Patient will benefit from skilled therapeutic intervention in order to improve the following deficits and impairments:  Postural dysfunction, Pain, Decreased strength, Cardiopulmonary status limiting activity  Visit Diagnosis: Decreased muscle strength  Chronic  right-sided low back pain with right-sided sciatica     Problem List Patient Active Problem List   Diagnosis Date Noted  . Drug-induced jaundice 04/21/2017  . Rash and  nonspecific skin eruption 04/21/2017  . Transaminitis 02/20/2017  . Onychomycosis of left great toe 02/17/2017  . Corn of foot 02/17/2017  . Brain metastases ([place]) 01/30/2017  . Bone metastases ([place]) 01/30/2017  . Internal hemorrhoids 01/07/2017  . Kidney cancer, primary, with metastasis from kidney to other site, right ([place]) 01/05/2017  . Acute right-sided low back pain with right-sided sciatica 12/24/2016  . Goals of care, counseling/discussion 12/22/2016  . Benign prostatic hyperplasia with nocturia 12/10/2016  . Abnormal CT of the chest 12/10/2016  . Hilar adenopathy 12/10/2016  . Multiple pulmonary nodules determined by computed tomography of lung 12/10/2016   Donato Heinz. Owens Shark PT  Norwood Levo 04/28/2017, 12:44 PM  [place], [place], [address] Phone: [phone]   Fax:  [phone]  Name: Jujuan Dugo MRN: [id number] Date of Birth: 1961/10/11

## 2017-04-28 NOTE — Patient Instructions (Addendum)
Sleeping on Back  Place pillow under knees. A pillow with cervical support and a roll around waist are also helpful. Copyright  VHI. All rights reserved.  Sleeping on Side Place pillow between knees. Use cervical support under neck and a roll around waist as needed. Copyright  VHI. All rights reserved.   Sleeping on Stomach   If this is the only desirable sleeping position, place pillow under lower legs, and under stomach or chest as needed.  Posture - Sitting   Sit upright, head facing forward. Try using a roll to support lower back. Keep shoulders relaxed, and avoid rounded back. Keep hips level with knees. Avoid crossing legs for long periods. Stand to Sit / Sit to Stand   To sit: Bend knees to lower self onto front edge of chair, then scoot back on seat. To stand: Reverse sequence by placing one foot forward, and scoot to front of seat. Use rocking motion to stand up.   Work Height and Reach  Ideal work height is no more than 2 to 4 inches below elbow level when standing, and at elbow level when sitting. Reaching should be limited to arm's length, with elbows slightly bent.  Bending  Bend at hips and knees, not back. Keep feet shoulder-width apart.    Posture - Standing   Good posture is important. Avoid slouching and forward head thrust. Maintain curve in low back and align ears over shoul- ders, hips over ankles.  Alternating Positions   Alternate tasks and change positions frequently to reduce fatigue and muscle tension. Take rest breaks. Computer Work   Position work to face forward. Use proper work and seat height. Keep shoulders back and down, wrists straight, and elbows at right angles. Use chair that provides full back support. Add footrest and lumbar roll as needed.  Getting Into / Out of Car  Lower self onto seat, scoot back, then bring in one leg at a time. Reverse sequence to get out.  Dressing  Lie on back to pull socks or slacks over feet, or sit  and bend leg while keeping back straight.    Housework - Sink  Place one foot on ledge of cabinet under sink when standing at sink for prolonged periods.   Pushing / Pulling  Pushing is preferable to pulling. Keep back in proper alignment, and use leg muscles to do the work.  Deep Squat   Squat and lift with both arms held against upper trunk. Tighten stomach muscles without holding breath. Use smooth movements to avoid jerking.  Avoid Twisting   Avoid twisting or bending back. Pivot around using foot movements, and bend at knees if needed when reaching for articles.  Carrying Luggage   Distribute weight evenly on both sides. Use a cart whenever possible. Do not twist trunk. Move body as a unit.   Lifting Principles .Maintain proper posture and head alignment. .Slide object as close as possible before lifting. .Move obstacles out of the way. .Test before lifting; ask for help if too heavy. .Tighten stomach muscles without holding breath. .Use smooth movements; do not jerk. .Use legs to do the work, and pivot with feet. .Distribute the work load symmetrically and close to the center of trunk. .Push instead of pull whenever possible.   Ask For Help   Ask for help and delegate to others when possible. Coordinate your movements when lifting together, and maintain the low back curve.  Log Roll   Lying on back, bend left knee and place left   arm across chest. Roll all in one movement to the right. Reverse to roll to the left. Always move as one unit. Housework - Sweeping  Use long-handled equipment to avoid stooping.   Housework - Wiping  Position yourself as close as possible to reach work surface. Avoid straining your back.  Laundry - Unloading Wash   To unload small items at bottom of washer, lift leg opposite to arm being used to reach.  [place] close to area to be raked. Use arm movements to do the work. Keep back straight and avoid  twisting.     Cart  When reaching into cart with one arm, lift opposite leg to keep back straight.   Getting Into / Out of Bed  Lower self to lie down on one side by raising legs and lowering head at the same time. Use arms to assist moving without twisting. Bend both knees to roll onto back if desired. To sit up, start from lying on side, and use same move-ments in reverse. Housework - Vacuuming  Hold the vacuum with arm held at side. Step back and forth to move it, keeping head up. Avoid twisting.   Laundry - IT consultant so that bending and twisting can be avoided.   Laundry - Unloading Dryer  Squat down to reach into clothes dryer or use a reacher.  Gardening - Weeding / Probation officer or Kneel. Knee pads may be helpful.                         PELVIC TILT  Lie on back, legs bent. Exhale, tilting top of pelvis back, pubic bone up, to flatten lower back. Inhale, rolling pelvis opposite way, top forward, pubic bone down, arch in back. Repeat __10__ times. Do __2__ sessions per day. Copyright  VHI. All rights reserved.     Lie with hips and knees bent. Slowly inhale, and then exhale. Pull navel toward spine and tighten pelvic floor. Hold for __10_ seconds. Continue to breathe in and out during hold. Rest for _10__ seconds. Repeat __10_ times. Do __2-3_ times a day.   Copyright  VHI. All rights reserved.  Knee Fold   Lie on back, legs bent, arms by sides. Exhale, lifting knee to chest. Inhale, returning. Keep abdominals flat, navel to spine. Repeat __10__ times, alternating legs. Do __2__ sessions per day.  Copyright  VHI. All rights reserved.  Knee Drop   Keep pelvis stable. Without rotating hips, slowly drop knee to side, pause, return to center, bring knee across midline toward opposite hip. Feel obliques engaging. Repeat for ___10_ times each leg.  Copyright  VHI. All rights reserved.  Isometric Hold With  Pelvic Floor (Hook-Lying)    Copyright  VHI. All rights reserved.  Heel Slide to Straight   Slide one leg down to straight. Return. Be sure pelvis does not rock forward, tilt, rotate, or tip to side. Do _10__ times. Restabilize pelvis. Repeat with other leg. Do __1-2_ sets, __2_ times per day.  http://ss.exer.us/16   Copyright  VHI. All rights reserved.

## 2017-04-29 ENCOUNTER — Ambulatory Visit (HOSPITAL_BASED_OUTPATIENT_CLINIC_OR_DEPARTMENT_OTHER): Payer: BLUE CROSS/BLUE SHIELD | Admitting: Hematology & Oncology

## 2017-04-29 ENCOUNTER — Other Ambulatory Visit: Payer: Self-pay | Admitting: Radiation Therapy

## 2017-04-29 ENCOUNTER — Ambulatory Visit (HOSPITAL_BASED_OUTPATIENT_CLINIC_OR_DEPARTMENT_OTHER): Payer: BLUE CROSS/BLUE SHIELD

## 2017-04-29 ENCOUNTER — Telehealth: Payer: Self-pay | Admitting: *Deleted

## 2017-04-29 ENCOUNTER — Other Ambulatory Visit (HOSPITAL_BASED_OUTPATIENT_CLINIC_OR_DEPARTMENT_OTHER): Payer: BLUE CROSS/BLUE SHIELD

## 2017-04-29 VITALS — BP 125/71 | HR 94 | Temp 99.2°F | Resp 20 | Wt 239.4 lb

## 2017-04-29 DIAGNOSIS — C787 Secondary malignant neoplasm of liver and intrahepatic bile duct: Secondary | ICD-10-CM

## 2017-04-29 DIAGNOSIS — C641 Malignant neoplasm of right kidney, except renal pelvis: Secondary | ICD-10-CM

## 2017-04-29 DIAGNOSIS — C7931 Secondary malignant neoplasm of brain: Secondary | ICD-10-CM

## 2017-04-29 DIAGNOSIS — C7951 Secondary malignant neoplasm of bone: Secondary | ICD-10-CM

## 2017-04-29 DIAGNOSIS — C649 Malignant neoplasm of unspecified kidney, except renal pelvis: Secondary | ICD-10-CM

## 2017-04-29 LAB — COMPREHENSIVE METABOLIC PANEL
ALT: 198 U/L — AB (ref 0–55)
ANION GAP: 9 meq/L (ref 3–11)
AST: 134 U/L — ABNORMAL HIGH (ref 5–34)
Albumin: 2.4 g/dL — ABNORMAL LOW (ref 3.5–5.0)
Alkaline Phosphatase: 803 U/L — ABNORMAL HIGH (ref 40–150)
BILIRUBIN TOTAL: 14.63 mg/dL — AB (ref 0.20–1.20)
BUN: 7.5 mg/dL (ref 7.0–26.0)
CALCIUM: 9.1 mg/dL (ref 8.4–10.4)
CHLORIDE: 103 meq/L (ref 98–109)
CO2: 23 mEq/L (ref 22–29)
CREATININE: 0.7 mg/dL (ref 0.7–1.3)
EGFR: >90 mL/min/{1.73_m2} (ref >90)
Glucose: 150 mg/dl — ABNORMAL HIGH (ref 70–140)
Potassium: 3.7 mEq/L (ref 3.5–5.1)
Sodium: 136 mEq/L (ref 136–145)
Total Protein: 6.2 g/dL — ABNORMAL LOW (ref 6.4–8.3)

## 2017-04-29 LAB — CBC WITH DIFFERENTIAL (CANCER CENTER ONLY)
BASO#: 0 10*3/uL (ref 0.0–0.2)
BASO%: 0.5 % (ref 0.0–2.0)
EOS ABS: 0.4 10*3/uL (ref 0.0–0.5)
EOS%: 6.1 % (ref 0.0–7.0)
HCT: 34.9 % — ABNORMAL LOW (ref 38.7–49.9)
HEMOGLOBIN: 11.4 g/dL — AB (ref 13.0–17.1)
LYMPH#: 2.1 10*3/uL (ref 0.9–3.3)
LYMPH%: 35.3 % (ref 14.0–48.0)
MCH: 30.9 pg (ref 28.0–33.4)
MCHC: 32.7 g/dL (ref 32.0–35.9)
MCV: 95 fL (ref 82–98)
MONO#: 0.7 10*3/uL (ref 0.1–0.9)
MONO%: 10.8 % (ref 0.0–13.0)
NEUT#: 2.9 10*3/uL (ref 1.5–6.5)
NEUT%: 47.3 % (ref 40.0–80.0)
PLATELETS: 400 10*3/uL (ref 145–400)
RBC: 3.69 10*6/uL — ABNORMAL LOW (ref 4.20–5.70)
RDW: 21.2 % — AB (ref 11.1–15.7)
WBC: 6 10*3/uL (ref 4.0–10.0)

## 2017-04-29 LAB — LACTATE DEHYDROGENASE: LDH: 365 U/L — ABNORMAL HIGH (ref 125–245)

## 2017-04-29 LAB — TECHNOLOGIST REVIEW CHCC SATELLITE

## 2017-04-29 MED ORDER — DENOSUMAB 120 MG/1.7ML ~~LOC~~ SOLN
120.0000 mg | Freq: Once | SUBCUTANEOUS | Status: AC
  Administered 2017-04-29: 120 mg via SUBCUTANEOUS
  Filled 2017-04-29: qty 1.7

## 2017-04-29 NOTE — Telephone Encounter (Signed)
Received panic Bilirubin 14.63 from [place] at [facility] lab.  Reviewed with Dr. Marin Olp. No orders received

## 2017-04-29 NOTE — Progress Notes (Signed)
Hematology and Oncology Follow Up Visit  Alexander Fry [id number] 03-Mar-1961 56 y.o. 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 We are following him for his hepatic toxicity from the Votrient. He is feeling okay. He still has some pruritus. He did not like taking the cholestyramine.  He has had no nausea or vomiting. He's had no diarrhea. He's had no fever. He's had no headache.  He will be going to the [place] weekend.  His last bilirubin was 12.9.  He's had no urinary issues. He's had no hematuria.  Overall, I see his performance status is ECOG 0.   Medications:  Allergies as of 04/29/2017   No Known Allergies     Medication List       Accurate as of 04/29/17 10:25 AM. Always use your most recent med list.          cholestyramine 4 g packet Commonly known as:  QUESTRAN Take 1 packet (4 g total) by mouth 3 (three) times daily with meals.   diphenhydrAMINE 25 mg capsule Commonly known as:  BENADRYL Take 1-2 capsules (25-50 mg total) by mouth every 4 (four) hours as needed.   tamsulosin 0.4 MG Caps capsule Commonly known as:  FLOMAX Take 1 capsule (0.4 mg total) by mouth daily.       Allergies: No Known Allergies  Past Medical History, Surgical history, Social history, and Family History were reviewed and updated.  Review of Systems: All other 10 point review of systems is negative.   Physical Exam:  weight is 239 lb 6.4 oz (108.6 kg). His oral temperature is 99.2 F (37.3 C). His blood pressure is 125/71 and his pulse is 94. His respiration is 20 and oxygen saturation is 96%.   Wt Readings from Last 3  Encounters:  04/29/17 239 lb 6.4 oz (108.6 kg)  04/25/17 235 lb 12.8 oz (107 kg)  04/21/17 230 lb (104.3 kg)    Head and neck exam shows some scleral icterus. He has some palatal icterus. There is no adenopathy in the neck. Extraocular muscles move well. Lungs are clear. Cardiac exam regular rate and rhythm with no murmurs, rubs or bruits. Abdomen is soft. There is no guarding or rebound tenderness. He has no palpable hepatomegaly. There is no fluid wave. Back exam shows no tenderness over the spine, ribs or hips. Extremities shows no clubbing, cyanosis or edema. Neurological exam shows no focal neurological deficits. Skin exam does show some jaundice.  Lab Results  Component Value Date   WBC 6.0 04/29/2017   HGB 11.4 (L) 04/29/2017   HCT 34.9 (L) 04/29/2017   MCV 95 04/29/2017   PLT 400 04/29/2017   No results found for: FERRITIN, IRON, TIBC, UIBC, IRONPCTSAT Lab Results  Component Value Date   RBC 3.69 (L) 04/29/2017   No results found for: KPAFRELGTCHN, LAMBDASER, KAPLAMBRATIO No results found for: IGGSERUM, IGA, IGMSERUM No results found for: TOTALPROTELP, ALBUMINELP, A1GS, A2GS, BETS, BETA2SER, GAMS, MSPIKE, SPEI   Chemistry      Component Value Date/Time   NA 132 (L) 04/25/2017 1406   NA 138 04/21/2017 0916   K 3.9 04/25/2017 1406   K  3.9 04/21/2017 0916   CL 101 04/25/2017 1406   CL 103 04/19/2017 1421   CO2 24 04/25/2017 1406   CO2 26 04/21/2017 0916   BUN 9 04/25/2017 1406   BUN 7.8 04/21/2017 0916   CREATININE 0.60 (L) 04/25/2017 1406   CREATININE 0.7 04/21/2017 0916      Component Value Date/Time   CALCIUM 8.5 (L) 04/25/2017 1406   CALCIUM 9.0 04/21/2017 0916   ALKPHOS 923 (H) 04/25/2017 1406   ALKPHOS 595 (H) 04/21/2017 0916   AST 220 (H) 04/25/2017 1406   AST 184 (HH) 04/21/2017 0916   ALT 290 (H) 04/25/2017 1406   ALT 365 (HH) 04/21/2017 0916   BILITOT 12.9 (H) 04/25/2017 1406   BILITOT 7.66 ([place]) 04/21/2017 0916      Impression and Plan: Alexander Fry is  a 56 year old white male. He has metastatic renal cell carcinoma. He has responded to Votrient. Unfortunately, the Votrient  is causing significant hepatic toxicity. His actual liver function tests are improving. The bilirubin still is going up. Hopefully, it has peaked and will begin to come down.  There is no encephalitis. He has no renal dysfunction. There is no hepatorenal syndrome.  I have to keep following him closely. We will go ahead and plan to get him back next week. Again, we will hopefully see his bilirubin improve.  I just am very disappointed that he has developed this and yet is responding to therapy. I think that when we have to get him back on therapy I am not sure that we will be able to retry him on Votrient at a lower dose.  With the latest study that showed that combined immunotherapy is quite effective, we might consider this.  I spent about 30 minutes with him. He really looks great outside of the jaundice. He says he feels good. I do not see any problems with him going to visit friends in the [place] part of [place].  I did tell him to try to avoid heavy protein meals. I told him that if he is going to have protein that it should be fish protein.    [name], MD 5/25/201810:25 AM.

## 2017-04-29 NOTE — Patient Instructions (Signed)

## 2017-05-05 ENCOUNTER — Ambulatory Visit (HOSPITAL_BASED_OUTPATIENT_CLINIC_OR_DEPARTMENT_OTHER): Payer: BLUE CROSS/BLUE SHIELD | Admitting: Family

## 2017-05-05 ENCOUNTER — Ambulatory Visit: Payer: BLUE CROSS/BLUE SHIELD

## 2017-05-05 ENCOUNTER — Other Ambulatory Visit (HOSPITAL_BASED_OUTPATIENT_CLINIC_OR_DEPARTMENT_OTHER): Payer: BLUE CROSS/BLUE SHIELD

## 2017-05-05 VITALS — BP 119/67 | HR 94 | Temp 99.2°F | Resp 17 | Wt 239.0 lb

## 2017-05-05 DIAGNOSIS — C649 Malignant neoplasm of unspecified kidney, except renal pelvis: Secondary | ICD-10-CM | POA: Diagnosis not present

## 2017-05-05 DIAGNOSIS — C787 Secondary malignant neoplasm of liver and intrahepatic bile duct: Secondary | ICD-10-CM

## 2017-05-05 DIAGNOSIS — M6281 Muscle weakness (generalized): Secondary | ICD-10-CM

## 2017-05-05 DIAGNOSIS — C7951 Secondary malignant neoplasm of bone: Secondary | ICD-10-CM | POA: Diagnosis not present

## 2017-05-05 DIAGNOSIS — C641 Malignant neoplasm of right kidney, except renal pelvis: Secondary | ICD-10-CM

## 2017-05-05 DIAGNOSIS — C7800 Secondary malignant neoplasm of unspecified lung: Secondary | ICD-10-CM

## 2017-05-05 DIAGNOSIS — C7931 Secondary malignant neoplasm of brain: Secondary | ICD-10-CM

## 2017-05-05 DIAGNOSIS — G8929 Other chronic pain: Secondary | ICD-10-CM

## 2017-05-05 DIAGNOSIS — M5441 Lumbago with sciatica, right side: Secondary | ICD-10-CM

## 2017-05-05 LAB — CBC WITH DIFFERENTIAL (CANCER CENTER ONLY)
BASO#: 0.1 10*3/uL (ref 0.0–0.2)
BASO%: 1.3 % (ref 0.0–2.0)
EOS%: 13 % — AB (ref 0.0–7.0)
Eosinophils Absolute: 1.2 10*3/uL — ABNORMAL HIGH (ref 0.0–0.5)
HCT: 35.9 % — ABNORMAL LOW (ref 38.7–49.9)
HGB: 11.6 g/dL — ABNORMAL LOW (ref 13.0–17.1)
LYMPH#: 2.6 10*3/uL (ref 0.9–3.3)
LYMPH%: 27.2 % (ref 14.0–48.0)
MCH: 31.5 pg (ref 28.0–33.4)
MCHC: 32.3 g/dL (ref 32.0–35.9)
MCV: 98 fL (ref 82–98)
MONO#: 1.2 10*3/uL — ABNORMAL HIGH (ref 0.1–0.9)
MONO%: 13 % (ref 0.0–13.0)
NEUT#: 4.3 10*3/uL (ref 1.5–6.5)
NEUT%: 45.5 % (ref 40.0–80.0)
PLATELETS: 452 10*3/uL — AB (ref 145–400)
RBC: 3.68 10*6/uL — ABNORMAL LOW (ref 4.20–5.70)
RDW: 18 % — AB (ref 11.1–15.7)
WBC: 9.4 10*3/uL (ref 4.0–10.0)

## 2017-05-05 LAB — COMPREHENSIVE METABOLIC PANEL (CC13)
ALK PHOS: 749 IU/L — AB (ref 39–117)
ALT: 132 IU/L — ABNORMAL HIGH (ref 0–44)
AST: 98 IU/L — AB (ref 0–40)
Albumin, Serum: 3 g/dL — ABNORMAL LOW (ref 3.5–5.5)
Albumin/Globulin Ratio: 0.9 — ABNORMAL LOW (ref 1.2–2.2)
BILIRUBIN TOTAL: 11.6 mg/dL — AB (ref 0.0–1.2)
BUN / CREAT RATIO: 13 (ref 9–20)
BUN: 8 mg/dL (ref 6–24)
CHLORIDE: 100 mmol/L (ref 96–106)
CO2: 23 mmol/L (ref 18–29)
CREATININE: 0.64 mg/dL — AB (ref 0.76–1.27)
Calcium, Ser: 8.8 mg/dL (ref 8.7–10.2)
GFR calc Af Amer: 127 mL/min/{1.73_m2} (ref >59)
GFR calc non Af Amer: 109 mL/min/{1.73_m2} (ref >59)
GLUCOSE: 109 mg/dL — AB (ref 65–99)
Globulin, Total: 3.3 g/dL (ref 1.5–4.5)
Potassium, Ser: 3.9 mmol/L (ref 3.5–5.2)
SODIUM: 131 mmol/L — AB (ref 134–144)
Total Protein: 6.3 g/dL (ref 6.0–8.5)

## 2017-05-05 LAB — TECHNOLOGIST REVIEW CHCC SATELLITE

## 2017-05-05 NOTE — Progress Notes (Signed)
Hematology and Oncology Follow Up Visit  Alexander Fry [id number] 1961-04-29 56 y.o. 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 He is feeling better and states that his energy has improved.  His sclera are still jaundiced. He still has some itching but benadryl helps resolve this.  His bilirubin is now 11.6, alk/phos 749, ALT 132 and AST 98.   No fever, chills, cough, rash, dizziness, SOB, chest pain, palpitations, abdominal pain or changes in bowel or bladder habits.  He has had some nausea, no vomiting. He states that he has zofran and compazine for nausea at home but has not tried this yet. He will try taking as needed.  No episodes of bleeding, bruising or petechiae. No lymphadenopathy found on exam.  No swelling, tenderness, numbness or tingling in his extremities. No c/o pain at this time.  He has no appetite but is making himself eat. He is staying hydrated. His weight is stable.   ECOG Performance Status: 1 - Symptomatic but completely ambulatory  Medications:  Allergies as of 05/05/2017   No Known Allergies     Medication List       Accurate as of 05/05/17  3:06 PM. Always use your most recent med list.          cholestyramine 4 g packet Commonly known as:  QUESTRAN Take 1 packet (4 g total) by mouth 3 (three) times daily with meals.   diphenhydrAMINE 25 mg capsule Commonly known as:  BENADRYL Take 1-2 capsules (25-50 mg total) by mouth every 4 (four) hours as needed.   tamsulosin 0.4 MG Caps capsule Commonly known as:  FLOMAX Take 1 capsule (0.4 mg total) by mouth daily.       Allergies: No Known Allergies  Past Medical  History, Surgical history, Social history, and Family History were reviewed and updated.  Review of Systems: All other 10 point review of systems is negative.   Physical Exam:  weight is 239 lb (108.4 kg). His oral temperature is 99.2 F (37.3 C). His blood pressure is 119/67 and his pulse is 94. His respiration is 17 and oxygen saturation is 97%.   Wt Readings from Last 3 Encounters:  05/05/17 239 lb (108.4 kg)  04/29/17 239 lb 6.4 oz (108.6 kg)  04/25/17 235 lb 12.8 oz (107 kg)    Ocular: Sclerae unicteric, pupils equal, round and reactive to light Ear-nose-throat: Oropharynx clear, dentition fair Lymphatic: No cervical, supraclavicular or axillary adenopathy Lungs no rales or rhonchi, good excursion bilaterally Heart regular rate and rhythm, no murmur appreciated Abd soft, nontender, positive bowel sounds, no liver or spleen tip palpated on exam, no fluid wave MSK no focal spinal tenderness, no joint edema Neuro: non-focal, well-oriented, appropriate affect Breasts: Deferred   Lab Results  Component Value Date   WBC 9.4 05/05/2017   HGB 11.6 (L) 05/05/2017   HCT 35.9 (L) 05/05/2017   MCV 98 05/05/2017   PLT 452 (H) 05/05/2017   No results found for: FERRITIN, IRON, TIBC, UIBC, IRONPCTSAT Lab Results  Component Value Date   RBC 3.68 (L) 05/05/2017   No results found for: KPAFRELGTCHN, LAMBDASER, KAPLAMBRATIO No results found for: IGGSERUM, IGA, IGMSERUM  No results found for: Odetta Pink, SPEI   Chemistry      Component Value Date/Time   NA 136 04/29/2017 0919   K 3.7 04/29/2017 0919   CL 101 04/25/2017 1406   CL 103 04/19/2017 1421   CO2 23 04/29/2017 0919   BUN 7.5 04/29/2017 0919   CREATININE 0.7 04/29/2017 0919      Component Value Date/Time   CALCIUM 9.1 04/29/2017 0919   ALKPHOS 803 (H) 04/29/2017 0919   AST 134 (H) 04/29/2017 0919   ALT 198 (H) 04/29/2017 0919   BILITOT 14.63 ([place]) 04/29/2017 0919        Impression and Plan: Alexander Fry is a very pleasant caucasian gentleman with metastatic renal cell carcinoma. He is still off of Votrient and his LFTS and bilirubin continue to slowly improve. He is feeling much better. His energy has improved and benadryl has helped resolve his itching.  He will stay off the Votrient for now.  We will plan to see him back in 3 weeks for repeat lab work, follow-up and to discuss treatment, possibly immunotherapy.  He will contact our office with any questions or concerns and go to the ED in the event of an emergency. We can certainly see him sooner if need be.   Eliezer Bottom, NP 5/31/20183:06 PM

## 2017-05-05 NOTE — Therapy (Signed)
[place], [place], [address] Phone: [phone]   Fax:  [phone]  Physical Therapy Treatment  Patient Details  Name: Alexander Fry MRN: [id number] Date of Birth: [birth year] Referring Provider: Dr. Isidore Moos   Encounter Date: 05/05/2017      PT End of Session - 05/05/17 1205    Visit Number 3   Number of Visits 5   Date for PT Re-Evaluation 05/20/17   PT Start Time 1009   PT Stop Time 1059   PT Time Calculation (min) 50 min   Activity Tolerance Patient tolerated treatment well   Behavior During Therapy [facility] for tasks assessed/performed      Past Medical History:  Diagnosis Date  . Goals of care, counseling/discussion 12/22/2016  . History of radiation therapy 02/11/2017   SRT right posterior frontal 12 mm target 20 Gy, Right anterior frontal 38mm 20 Gy  . History of radiation therapy 02/21/2017   SRT Right post parietal 64mm target 20 Gy, left Post parietal 61mm 20 Gy, Right Occipital 4 mm 20Gy, Left Occipital 4 mm 20 Gy  . History of radiation therapy 02/25/2017, 02/28/2017, 03/02/2017   SRT L1 spine 27 Gy 3 fractions, L4 Spine 27 Gy 3 fractions  . Inguinal hernia of left side without obstruction or gangrene   . Pneumonia    left lung  . Renal cell carcinoma, right ([place]) 01/05/2017  . 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 His vision has "spots" at times    Past Surgical History:  Procedure Laterality Date  . HERNIA REPAIR    . INGUINAL HERNIA REPAIR Left     There were no vitals filed for this visit.      Subjective Assessment - 05/05/17 1016    Subjective I saw Dr. Marin Olp last week and it wasa good appointment with nothing new, in a good way. The itching is a little more under control as well. Haven't gotten to do the exercises since I was here last bc I've been dealing with some nauseous and trying to find things I can eat that agree with me. My back is okay right ow, just  really tight when I stand up due to my lack of moving around lately.     Pertinent History Right kidney tumor diagnosed in Jan 18. He has metastatic lesions in lumbar spine with right sided leg pain  with radiation and brain with sterotactic radiation in March 2018, with possible pneumonitis that may be a consequence from stepping down from a steriod  Remote his of back injury.  In 2015 weight 310# but lost >75 pounds with diet and exercise.    Patient Stated Goals To stengthen my upper thigh muscles ( weakness from steroids and treatments )    Currently in Pain? No/denies  Back just always feels tight                         [facility] Adult PT Treatment/Exercise - 05/05/17 0001      Knee/Hip Exercises: Stretches   Passive Hamstring Stretch Right;Left;3 reps;20 seconds  With gait belt   Passive Hamstring Stretch Limitations Also had pt return demonstration in sitting   Piriformis Stretch Right;Left;1 rep;20 seconds   Piriformis Stretch Limitations Unable to do supine, too challenging     Knee/Hip Exercises: Aerobic   Recumbent Bike Leel 2 x5 minutes, mod SOB after      Knee/Hip Exercises: Standing  Hip Flexion Stengthening;Right;Left;10 reps  Standing at back of bike for +1 HHA   Hip Abduction Stengthening;Right;Left;10 reps  Standing at back of bike for +1 HHA   Abduction Limitations VC for correct technique   Hip Extension Stengthening;Right;Left;10 reps  Standing at back of bike for +1 HHA   Extension Limitations VC for decrease forward trunk lean   Functional Squat 10 reps  In front of chair     Knee/Hip Exercises: Seated   Long Arc Quad Strengthening;Right;Left;10 reps  5 second holds   Marching Strengthening;Right;Left;10 reps  Slow and controlled     Knee/Hip Exercises: Supine   Bridges Strengthening;10 reps   Other Supine Knee/Hip Exercises Pelvic tilts x10, tilt with : alternate marching and then Rt hip abduction, then Lt 10 times each with VC throughout  for pelvic stability/hold tilt.     Knee/Hip Exercises: Sidelying   Hip ABduction Strengthening;Right;Left;10 reps                PT Education - 05/05/17 1056    Education provided Yes   Education Details Hip flexibility and strength                Long Term Clinic Goals - 04/28/17 1243      CC Long Term Goal  #1   Title Patient will be independent in an exercise program using body weight for strenthening lower extremities    Time 4   Period Weeks   Status On-going     CC Long Term Goal  #2   Title Pt will report decrease in pain and feelings of weakness in his legs by 50%    Time 4   Period Weeks   Status On-going     CC Long Term Goal  #3   Title Pt will state he has an understanding of good body mechanics so he can return to work and protect his back from injury    Status Achieved            Plan - 05/05/17 1211    Clinical Impression Statement Pt tolerated new exercises very well today. His itching did increase as he got warmer from exercises/increased circulation, but he did not have any increased LBP today with any exercises.    Rehab Potential Excellent   Clinical Impairments Affecting Rehab Potential metastatic cancer to bone and brain   PT Frequency 1x / week   PT Duration 4 weeks   PT Treatment/Interventions ADLs/Self Care Home Management;Patient/family education;Therapeutic exercise;Neuromuscular re-education   PT Next Visit Plan Review HEP issued today; add repeated sit to stand and standing low rows with blue theraband; cont core and leg strengthening. Assess back pain.    Consulted and Agree with Plan of Care Patient      Patient will benefit from skilled therapeutic intervention in order to improve the following deficits and impairments:  Postural dysfunction, Pain, Decreased strength, Cardiopulmonary status limiting activity  Visit Diagnosis: Decreased muscle strength  Chronic right-sided low back pain with right-sided  sciatica     Problem List Patient Active Problem List   Diagnosis Date Noted  . Drug-induced jaundice 04/21/2017  . Rash and nonspecific skin eruption 04/21/2017  . Transaminitis 02/20/2017  . Onychomycosis of left great toe 02/17/2017  . Corn of foot 02/17/2017  . Brain metastases ([place]) 01/30/2017  . Bone metastases ([place]) 01/30/2017  . Internal hemorrhoids 01/07/2017  . Kidney cancer, primary, with metastasis from kidney to other site, right ([place]) 01/05/2017  . Acute right-sided  low back pain with right-sided sciatica 12/24/2016  . Goals of care, counseling/discussion 12/22/2016  . Benign prostatic hyperplasia with nocturia 12/10/2016  . Abnormal CT of the chest 12/10/2016  . Hilar adenopathy 12/10/2016  . Multiple pulmonary nodules determined by computed tomography of lung 12/10/2016    Otelia Limes, PTA 05/05/2017, 12:25 PM  [place], [place], [address] Phone: [phone]   Fax:  [phone]  Name: Alexander Fry MRN: [id number] Date of Birth: 1961-07-06

## 2017-05-05 NOTE — Patient Instructions (Addendum)
Long Arc Knee Extension    Squeeze pelvic floor and hold. Straighten knee. Hold for _5__ seconds. Repeat _10__ times. Do _2-3__ times a day. Repeat with other leg.   Seated Alternating Leg Raise (Marching)    Sit on chair. Raise bent knee and return. Repeat with other leg. Slow and controlled with no shifting weight back and forth.  Do _2-3__ sets of _10__ repetitions.  Copyright  VHI. All rights reserved.  3 way leg swings: HIP: Abduction - Standing    Holding counter. Squeeze glutes and keep abdominals engaged throughout. Raise leg out to side leading with heel and making sure to not lean over to side with trunk.  _10__ reps per set, _1-2__ sets per day.   Also with holding onto counter: Front leg swings slow and controlled 10 times       Back leg swings keeping chest up 10 times.   Copyright  VHI. All rights reserved.   Flexors, Supine Bridge   Lie supine, feet shoulder-width apart. Lift hips toward ceiling. Hold ___ seconds. Repeat ___ times per session. Do ___ sessions per day.  Hamstring: Towel Stretch (Supine)    Lie on back. Loop towel around left foot, hip and knee at 90. Straighten knee and pull foot toward body. Hold _20-30__ seconds. Relax. Repeat _3__ times. Do _2-3__ times a day. Repeat with other leg. Can also do this in sitting by sitting on edge of chair, leaning forward with chest until stretch is felt and hold. 3 times, 20 sec holds each leg.    Copyright  VHI. All rights reserved.   Hip Stretch    Sit on chair, one foot on floor. Bend other leg and place ankle on knee. Flex hip and spine until stretch is achieved in the hip of the crossed leg leading with chest!  Hold _20__ seconds. Do _3__ repetitions.  Copyright  VHI. All rights reserved.   Abduction: Clam (Eccentric) - Side-Lying    Lie on side with knees bent. Lift top knee, keeping feet together. Keep trunk steady. Slowly lower for 3-5 seconds. _10__ reps per set, _2-3__ sets per  day.   Cancer Rehab [phone]

## 2017-05-06 LAB — LACTATE DEHYDROGENASE: LDH: 298 U/L — AB (ref 125–245)

## 2017-05-10 ENCOUNTER — Ambulatory Visit: Payer: BLUE CROSS/BLUE SHIELD | Attending: Radiation Oncology | Admitting: Physical Therapy

## 2017-05-10 DIAGNOSIS — M5441 Lumbago with sciatica, right side: Secondary | ICD-10-CM | POA: Diagnosis present

## 2017-05-10 DIAGNOSIS — G8929 Other chronic pain: Secondary | ICD-10-CM | POA: Diagnosis present

## 2017-05-10 DIAGNOSIS — M6281 Muscle weakness (generalized): Secondary | ICD-10-CM

## 2017-05-10 NOTE — Therapy (Signed)
[place], [place], [address] Phone: [phone]   Fax:  [phone]  Physical Therapy Treatment  Patient Details  Name: Alexander Fry MRN: [id number] Date of Birth: 1961-07-04 Referring Provider: Dr. Isidore Moos   Encounter Date: 05/10/2017      PT End of Session - 05/10/17 1113    Visit Number 4   Number of Visits 5   Date for PT Re-Evaluation 05/20/17   PT Start Time 6147   PT Stop Time 1105   PT Time Calculation (min) 50 min   Activity Tolerance Patient tolerated treatment well   Behavior During Therapy [facility] for tasks assessed/performed      Past Medical History:  Diagnosis Date  . Goals of care, counseling/discussion 12/22/2016  . History of radiation therapy 02/11/2017   SRT right posterior frontal 12 mm target 20 Gy, Right anterior frontal 47m 20 Gy  . History of radiation therapy 02/21/2017   SRT Right post parietal 411mtarget 20 Gy, left Post parietal 65m86m0 Gy, Right Occipital 4 mm 20Gy, Left Occipital 4 mm 20 Gy  . History of radiation therapy 02/25/2017, 02/28/2017, 03/02/2017   SRT L1 spine 27 Gy 3 fractions, L4 Spine 27 Gy 3 fractions  . Inguinal hernia of left side without obstruction or gangrene   . Pneumonia    left lung  . [place]  . 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 His vision has "spots" at times    Past Surgical History:  Procedure Laterality Date  . HERNIA REPAIR    . INGUINAL HERNIA REPAIR Left     There were no vitals filed for this visit.      Subjective Assessment - 05/10/17 1024    Subjective Pt states she has been having problems with and upset stomach that has been keeping him from going to the gym. He feels that he has to lie down and rest after. He feels that he wants to get back to the gym. He has been working alot outside and feels that his back is tight and tender    Pertinent History Right kidney tumor  diagnosed in Jan 18. He has metastatic lesions in lumbar spine with right sided leg pain  with radiation and brain with sterotactic radiation in March 2018, with possible pneumonitis that may be a consequence from stepping down from a steriod  Remote his of back injury.  In 2015 weight 310# but lost >75 pounds with diet and exercise.    Patient Stated Goals To stengthen my upper thigh muscles ( weakness from steroids and treatments )    Currently in Pain? No/denies                         [facility] - 05[facility] 0001      Self-Care   Self-Care Other Self-Care Comments   Other Self-Care Comments  reviewed body mechanics in relation to working out on machines in the gym. Advised him to avoid exercises and machines with lumbar flexion and to always focus on activating his core and keeping it stable while he is performing exercises with his arms and legs      Knee/Hip Exercises: Aerobic   Elliptical 5 minutes at 10 on ellliptical at 5 out of 10 on RPE  Pt with some dyspnea   pt to return to the gym on treadmill 0% grade for  10 -15 mi   Nustep 3 minutes at level 5 , RPE 1-2 out of 10      Knee/Hip Exercises: Standing   Forward Step Up Both;15 reps;Step Height: 8"   Forward Step Up Limitations encouraged pt to do steps up at home (during TV commercials) to help him increase LE and  general strength and endurance      Shoulder Exercises: Standing   Other Standing Exercises with cable machine with 10 pounds pt did 2 sets of 10 reps of bilateral low rows, one set of 10 of each diagonal pattern with cues to establish stable core and maintain it throughout [facility] Goals - 05/10/17 1103      CC Long Term Goal  #1   Title Patient will be independent in an exercise program using body weight for strenthening lower extremities    Status Achieved     CC Long Term Goal  #2   Title Pt will report decrease in  pain and feelings of weakness in his legs by 50%    Status Achieved     CC Long Term Goal  #3   Title Pt will state he has an understanding of good body mechanics so he can return to work and protect his back from injury    Status Achieved            Plan - 05/10/17 1113    Clinical Impression Statement Pt is making gradual improvement from his recent set back of increased bilirubin and still has some "stomach" problems.  He states he thinks he can work around them to get back to the gym. Reinforce being mindful of core stabalization to protect his back and choose activties with lumbar extension, avoiding lumbar flexion. Reviewed PT goals today and he feels that he has achieved them so this episode of PT is discharged.    Rehab Potential Excellent   Clinical Impairments Affecting Rehab Potential metastatic cancer to bone and brain   PT Frequency 1x / week   PT Duration 4 weeks   PT Treatment/Interventions ADLs/Self Care Home Management;Patient/family education;Therapeutic exercise;Neuromuscular re-education   PT Next Visit Plan Discharge this episode    Consulted and Agree with Plan of Care Patient      Patient will benefit from skilled therapeutic intervention in order to improve the following deficits and impairments:  Postural dysfunction, Pain, Decreased strength, Cardiopulmonary status limiting activity  Visit Diagnosis: Decreased muscle strength  Chronic right-sided low back pain with right-sided sciatica     Problem List Patient Active Problem List   Diagnosis Date Noted  . Drug-induced jaundice 04/21/2017  . Rash and nonspecific skin eruption 04/21/2017  . Transaminitis 02/20/2017  . Onychomycosis of left great toe 02/17/2017  . Corn of foot 02/17/2017  . Brain metastases ([place]) 01/30/2017  . Bone metastases ([place]) 01/30/2017  . Internal hemorrhoids 01/07/2017  . Kidney cancer, primary, with metastasis from kidney to other site, right ([place]) 01/05/2017  . Acute  right-sided low back pain with right-sided sciatica 12/24/2016  . Goals of care, counseling/discussion 12/22/2016  . Benign prostatic hyperplasia with nocturia 12/10/2016  . Abnormal CT of the chest 12/10/2016  . Hilar adenopathy 12/10/2016  . Multiple pulmonary nodules determined by computed tomography of lung 12/10/2016   PHYSICAL THERAPY DISCHARGE SUMMARY  Visits from Start of Care: 4  Current functional  level related to goals / functional outcomes: Pt readty to return to the gym for general exercise   Remaining deficits: Intermittent minor back pain    Education / Equipment: Body mechanics, home exercise  Plan: Patient agrees to discharge.  Patient goals were met. Patient is being discharged due to meeting the stated rehab goals.  ?????        Donato Heinz. Owens Shark PT  Norwood Levo 05/10/2017, 11:19 AM  [place], [place], [address] Phone: [phone]   Fax:  [phone]  Name: Alexander Fry MRN: [id number] Date of Birth: June 27, 1961

## 2017-05-11 ENCOUNTER — Encounter: Payer: Self-pay | Admitting: Hematology & Oncology

## 2017-05-19 ENCOUNTER — Encounter: Payer: Self-pay | Admitting: Physician Assistant

## 2017-05-24 ENCOUNTER — Other Ambulatory Visit: Payer: Self-pay

## 2017-05-24 DIAGNOSIS — N401 Enlarged prostate with lower urinary tract symptoms: Secondary | ICD-10-CM

## 2017-05-24 DIAGNOSIS — R351 Nocturia: Principal | ICD-10-CM

## 2017-05-24 MED ORDER — TAMSULOSIN HCL 0.4 MG PO CAPS: 0.4000 mg | ORAL_CAPSULE | Freq: Every day | ORAL | 0 refills | Status: DC

## 2017-05-26 ENCOUNTER — Ambulatory Visit (HOSPITAL_BASED_OUTPATIENT_CLINIC_OR_DEPARTMENT_OTHER): Payer: BLUE CROSS/BLUE SHIELD | Admitting: Family

## 2017-05-26 ENCOUNTER — Other Ambulatory Visit (HOSPITAL_BASED_OUTPATIENT_CLINIC_OR_DEPARTMENT_OTHER): Payer: BLUE CROSS/BLUE SHIELD

## 2017-05-26 VITALS — BP 115/67 | HR 83 | Temp 99.0°F | Resp 18 | Wt 242.0 lb

## 2017-05-26 DIAGNOSIS — C7951 Secondary malignant neoplasm of bone: Secondary | ICD-10-CM | POA: Diagnosis not present

## 2017-05-26 DIAGNOSIS — C649 Malignant neoplasm of unspecified kidney, except renal pelvis: Secondary | ICD-10-CM

## 2017-05-26 DIAGNOSIS — C7800 Secondary malignant neoplasm of unspecified lung: Secondary | ICD-10-CM

## 2017-05-26 DIAGNOSIS — C787 Secondary malignant neoplasm of liver and intrahepatic bile duct: Secondary | ICD-10-CM

## 2017-05-26 DIAGNOSIS — C7931 Secondary malignant neoplasm of brain: Secondary | ICD-10-CM

## 2017-05-26 DIAGNOSIS — C641 Malignant neoplasm of right kidney, except renal pelvis: Secondary | ICD-10-CM

## 2017-05-26 LAB — CBC WITH DIFFERENTIAL (CANCER CENTER ONLY)
BASO#: 0 10*3/uL (ref 0.0–0.2)
BASO%: 0.5 % (ref 0.0–2.0)
EOS%: 4.3 % (ref 0.0–7.0)
Eosinophils Absolute: 0.3 10*3/uL (ref 0.0–0.5)
HCT: 38.5 % — ABNORMAL LOW (ref 38.7–49.9)
HGB: 12.9 g/dL — ABNORMAL LOW (ref 13.0–17.1)
LYMPH#: 2.9 10*3/uL (ref 0.9–3.3)
LYMPH%: 37.1 % (ref 14.0–48.0)
MCH: 31.8 pg (ref 28.0–33.4)
MCHC: 33.5 g/dL (ref 32.0–35.9)
MCV: 95 fL (ref 82–98)
MONO#: 0.8 10*3/uL (ref 0.1–0.9)
MONO%: 9.6 % (ref 0.0–13.0)
NEUT#: 3.8 10*3/uL (ref 1.5–6.5)
NEUT%: 48.5 % (ref 40.0–80.0)
PLATELETS: 323 10*3/uL (ref 145–400)
RBC: 4.06 10*6/uL — AB (ref 4.20–5.70)
RDW: 13.9 % (ref 11.1–15.7)
WBC: 7.8 10*3/uL (ref 4.0–10.0)

## 2017-05-26 LAB — CMP (CANCER CENTER ONLY)
ALT: 86 U/L — AB (ref 10–47)
AST: 71 U/L — ABNORMAL HIGH (ref 11–38)
Albumin: 3.1 g/dL — ABNORMAL LOW (ref 3.3–5.5)
Alkaline Phosphatase: 294 U/L — ABNORMAL HIGH (ref 26–84)
BUN: 9 mg/dL (ref 7–22)
CHLORIDE: 106 meq/L (ref 98–108)
CO2: 29 mEq/L (ref 18–33)
CREATININE: 0.8 mg/dL (ref 0.6–1.2)
Calcium: 9.4 mg/dL (ref 8.0–10.3)
GLUCOSE: 111 mg/dL (ref 73–118)
POTASSIUM: 3.7 meq/L (ref 3.3–4.7)
SODIUM: 138 meq/L (ref 128–145)
TOTAL PROTEIN: 7.1 g/dL (ref 6.4–8.1)
Total Bilirubin: 2.4 mg/dl — ABNORMAL HIGH (ref 0.20–1.60)

## 2017-05-26 LAB — LACTATE DEHYDROGENASE: LDH: 227 U/L (ref 125–245)

## 2017-05-26 NOTE — Progress Notes (Signed)
Hematology and Oncology Follow Up Visit  Alexander Fry [id number] 1961/01/23 56 y.o. 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 He is doing well and states that he feels great. He is no longer jaundiced. His LFT's and Bilirubin continue to come down nicely.  We will now look at getting him back on treatment.  He is scheduled for MRIs of the brain and lumbar spine next week on Wednesday and follow-up with Dr. Isidore Fry that Friday.  He has had no fever, chills, n/v, rash, dizziness, SOB, chest pain, palpitations, abdominal pain or changes.  He has the same dry cough.  He has intermittent tingling in the right leg in the mornings. No swelling, tenderness or numbness in her extremities. No c/o pain.  He has a good appetite but has found certain smells when cooking bother him and he will have to go outside.   ECOG Performance Status: 1 - Symptomatic but completely ambulatory  Medications:  Allergies as of 05/26/2017   No Known Allergies     Medication List       Accurate as of 05/26/17  1:11 PM. Always use your most recent med list.          cholestyramine 4 g packet Commonly known as:  QUESTRAN Take 1 packet (4 g total) by mouth 3 (three) times daily with meals.   diphenhydrAMINE 25 mg capsule Commonly known as:  BENADRYL Take 1-2 capsules (25-50 mg total) by mouth every 4 (four) hours as needed.   tamsulosin 0.4 MG Caps capsule Commonly known as:  FLOMAX Take 1 capsule (0.4 mg total) by mouth daily.       Allergies: No Known Allergies  Past Medical History, Surgical history, Social history, and Family History were reviewed and  updated.  Review of Systems: All other 10 point review of systems is negative.   Physical Exam:  weight is 242 lb (109.8 kg). His oral temperature is 99 F (37.2 C). His blood pressure is 115/67 and his pulse is 83. His respiration is 18 and oxygen saturation is 95%.   Wt Readings from Last 3 Encounters:  05/26/17 242 lb (109.8 kg)  05/05/17 239 lb (108.4 kg)  04/29/17 239 lb 6.4 oz (108.6 kg)    Ocular: Sclerae unicteric, pupils equal, round and reactive to light Ear-nose-throat: Oropharynx clear, dentition fair Lymphatic: No cervical, supraclavicular or axillary adenopathy Lungs no rales or rhonchi, good excursion bilaterally Heart regular rate and rhythm, no murmur appreciated Abd soft, nontender, positive bowel sounds, no liver or spleen tip palpated on exam, no fluid wave  MSK no focal spinal tenderness, no joint edema Neuro: non-focal, well-oriented, appropriate affect Breasts: Deferred   Lab Results  Component Value Date   WBC 9.4 05/05/2017   HGB 11.6 (L) 05/05/2017   HCT 35.9 (L) 05/05/2017   MCV 98 05/05/2017   PLT 452 (H) 05/05/2017   No results found for: FERRITIN, IRON, TIBC, UIBC, IRONPCTSAT Lab Results  Component Value Date   RBC 3.68 (L) 05/05/2017   No results found for: KPAFRELGTCHN, LAMBDASER, KAPLAMBRATIO No results found for: IGGSERUM, IGA, IGMSERUM No results found for: TOTALPROTELP, ALBUMINELP, A1GS, A2GS, BETS, BETA2SER, GAMS,  MSPIKE, SPEI   Chemistry      Component Value Date/Time   NA 131 (L) 05/05/2017 1415   NA 136 04/29/2017 0919   K 3.9 05/05/2017 1415   K 3.7 04/29/2017 0919   CL 100 05/05/2017 1415   CL 103 04/19/2017 1421   CO2 23 05/05/2017 1415   CO2 23 04/29/2017 0919   BUN 8 05/05/2017 1415   BUN 7.5 04/29/2017 0919   CREATININE 0.64 (L) 05/05/2017 1415   CREATININE 0.7 04/29/2017 0919      Component Value Date/Time   CALCIUM 8.8 05/05/2017 1415   CALCIUM 9.1 04/29/2017 0919   ALKPHOS 749 (H) 05/05/2017 1415   ALKPHOS  803 (H) 04/29/2017 0919   AST 98 (H) 05/05/2017 1415   AST 134 (H) 04/29/2017 0919   ALT 132 (H) 05/05/2017 1415   ALT 198 (H) 04/29/2017 0919   BILITOT 11.6 (H) 05/05/2017 1415   BILITOT 14.63 ([place]) 04/29/2017 0919      Impression and Plan: Alexander Fry is a very pleasant 57 yo caucasian gentleman with metastatic renal cell carcinoma. His has had a break from treatment while waiting for his liver enzymes and bilirubin to come down. His numbers continue to improve.  I spoke with Dr. Marin Fry and we will now get him back on therapy with Cabometyx.  We discussed his new medication in detail and also gave him information sheets to look over at home.   All questions were answered and he is in agreement with the plan.  He will have MRIs of the brain and lumbar spine next week and follow-up with Dr. Isidore Fry that Friday.  He will follow-up with our office again in 3 weeks for follow-up and repeat lab work.  He will contact our office with any questions or concerns. We can certainly see him sooner if need be.   Eliezer Bottom, NP 6/21/20181:11 PM

## 2017-05-31 ENCOUNTER — Ambulatory Visit
Admission: RE | Admit: 2017-05-31 | Discharge: 2017-05-31 | Disposition: A | Discharge status: Home / Self Care | Payer: BLUE CROSS/BLUE SHIELD | Source: Ambulatory Visit | Attending: Radiation Oncology | Admitting: Radiation Oncology

## 2017-05-31 DIAGNOSIS — C641 Malignant neoplasm of right kidney, except renal pelvis: Secondary | ICD-10-CM

## 2017-05-31 MED ORDER — GADOBENATE DIMEGLUMINE 529 MG/ML IV SOLN
20.0000 mL | Freq: Once | INTRAVENOUS | Status: AC | PRN
  Administered 2017-05-31: 20 mL via INTRAVENOUS

## 2017-06-03 ENCOUNTER — Ambulatory Visit
Admission: RE | Admit: 2017-06-03 | Discharge: 2017-06-03 | Disposition: A | Discharge status: Home / Self Care | Payer: BLUE CROSS/BLUE SHIELD | Source: Ambulatory Visit | Attending: Radiation Oncology | Admitting: Radiation Oncology

## 2017-06-03 ENCOUNTER — Encounter: Payer: Self-pay | Admitting: Radiation Oncology

## 2017-06-03 DIAGNOSIS — C7931 Secondary malignant neoplasm of brain: Secondary | ICD-10-CM | POA: Diagnosis present

## 2017-06-03 DIAGNOSIS — C7951 Secondary malignant neoplasm of bone: Secondary | ICD-10-CM

## 2017-06-03 MED ORDER — SODIUM CHLORIDE 0.9 % IJ SOLN
10.0000 mL | Freq: Once | INTRAMUSCULAR | Status: AC
  Administered 2017-06-03: 10 mL via INTRAVENOUS

## 2017-06-03 NOTE — Progress Notes (Signed)
Has armband been applied?  Yes.    Does patient have an allergy to IV contrast dye?: No.   Has patient ever received premedication for IV contrast dye?: No.   Does patient take metformin?: No.  If patient does take metformin when was the last dose: n/a  Date of lab work: May 26, 2017 BUN: 9 CR: 0.8  IV site: antecubital left, condition patent  Has IV site been added to flowsheet?  Yes.    There were no vitals taken for this visit.

## 2017-06-03 NOTE — Progress Notes (Signed)
Alexander Fry is here for follow up after SRS to his brain and spine.  He denies having any pain, headaches, balance issues or vision changes.  He reports having occasional nausea caused by food odors.  He stopped taking Votrient on 04/15/17.  He reports his lower back is "tight and tender" which is worse with prolonged sitting or standing.  He is not taking decadron. He reports having a good energy level.  BP 122/72 (BP Location: Right Arm, Patient Position: Sitting)   Pulse 91   Temp 99.5 F (37.5 C) (Oral)   Ht 6' (1.829 m)   Wt 245 lb 3.2 oz (111.2 kg)   SpO2 98%   BMI 33.26 kg/m    Wt Readings from Last 3 Encounters:  06/03/17 245 lb 3.2 oz (111.2 kg)  05/26/17 242 lb (109.8 kg)  05/05/17 239 lb (108.4 kg)

## 2017-06-04 NOTE — Progress Notes (Signed)
  Name: Alexander Fry MRN: [id number]  Date: 06/03/2017  DOB: [birth year]  SIMULATION AND TREATMENT PLANNING NOTE    ICD-10-CM   1. Brain metastases [facility]) C79.31       Radiation Oncology         [phone] ________________________________  Name: Alexander Fry MRN: [id number]  Date: 06/03/2017  DOB: 08-Mar-1961  SIMULATION AND TREATMENT PLANNING NOTE    ICD-10-CM   1. Brain metastases ([place]) C79.31     DIAGNOSIS:  As above  NARRATIVE:  The patient was brought to the [facility].  Identity was confirmed.  All relevant records and images related to the planned course of therapy were reviewed.  The patient freely provided informed written consent to proceed with treatment after reviewing the details related to the planned course of therapy. The consent form was witnessed and verified by the simulation staff. Intravenous access was established for contrast administration. Then, the patient was set-up in a stable reproducible supine position for radiation therapy.  A relocatable thermoplastic stereotactic head frame was fabricated for precise immobilization.  CT images were obtained.  Surface markings were placed.  The CT images were loaded into the planning software and fused with the patient's targeting MRI scan.  Then the target and avoidance structures were contoured.  Treatment planning then occurred.  The radiation prescription was entered and confirmed.  I have requested 3D planning  I have requested a DVH of the following structures: Brain stem, brain, left eye, right eye, lenses, optic chiasm, target volumes, uninvolved brain, and normal tissue.    SPECIAL TREATMENT PROCEDURE:  The planned course of therapy using radiation constitutes a special treatment procedure. Special care is required in the management of this patient for the following reasons:  High dose per fraction requiring special monitoring for increased toxicities of treatment including daily imaging.  The special nature  of the planned course of radiotherapy will require increased physician supervision and oversight to ensure patient's safety with optimal treatment outcomes.  PLAN:  The patient will receive 20 Gy in 1 fraction to the 14 new brain metastases  ________________________________    [name], MD

## 2017-06-06 DIAGNOSIS — C7931 Secondary malignant neoplasm of brain: Secondary | ICD-10-CM | POA: Diagnosis not present

## 2017-06-07 ENCOUNTER — Ambulatory Visit
Admission: RE | Admit: 2017-06-07 | Discharge: 2017-06-07 | Disposition: A | Discharge status: Home / Self Care | Payer: BLUE CROSS/BLUE SHIELD | Source: Ambulatory Visit | Attending: Radiation Oncology | Admitting: Radiation Oncology

## 2017-06-07 ENCOUNTER — Encounter: Payer: Self-pay | Admitting: Radiation Oncology

## 2017-06-07 VITALS — BP 131/79 | HR 81 | Temp 98.3°F

## 2017-06-07 DIAGNOSIS — C7931 Secondary malignant neoplasm of brain: Secondary | ICD-10-CM | POA: Diagnosis not present

## 2017-06-07 NOTE — Op Note (Signed)
  Name: Alexander Fry  MRN: [id number]  Date: 06/07/2017   DOB: Jun 19, 1961  Stereotactic Radiosurgery Operative Note  PRE-OPERATIVE DIAGNOSIS:  Multiple Brain Metastases  POST-OPERATIVE DIAGNOSIS:  Multiple Brain Metastases  PROCEDURE:  Stereotactic Radiosurgery  SURGEON:  Peggyann Shoals, MD  NARRATIVE: The patient underwent a radiation treatment planning session in the radiation oncology simulation suite under the care of the radiation oncology physician and physicist.  I participated closely in the radiation treatment planning afterwards. The patient underwent planning CT which was fused to 3T high resolution MRI with 1 mm axial slices.  These images were fused on the planning system.  We contoured the gross target volumes and subsequently expanded this to yield the Planning Target Volume. I actively participated in the planning process.  I helped to define and review the target contours and also the contours of the optic pathway, eyes, brainstem and selected nearby organs at risk.  All the dose constraints for critical structures were reviewed and compared to AAPM Task Group 101.  The prescription dose conformity was reviewed.  I approved the plan electronically.    Accordingly, Alexander Fry was brought to the TrueBeam stereotactic radiation treatment linac and placed in the custom immobilization mask.  The patient was aligned according to the IR fiducial markers with BrainLab Exactrac, then orthogonal x-rays were used in ExacTrac with the 6DOF robotic table and the shifts were made to align the patient  Alexander Fry received stereotactic radiosurgery uneventfully.    Lesions treated:  15   Complex lesions treated:  0 (>3.5 cm, <18mm of optic path, or within the brainstem)   The detailed description of the procedure is recorded in the radiation oncology procedure note.  I was present for the duration of the procedure.  DISPOSITION:  Following delivery, the patient was transported to nursing in stable  condition and monitored for possible acute effects to be discharged to home in stable condition with follow-up in one month.  Peggyann Shoals, MD 06/07/2017 4:44 PM

## 2017-06-08 NOTE — Progress Notes (Signed)
  Radiation Oncology         [phone] ________________________________  Stereotactic Treatment Procedure Note  Name: Alexander Fry MRN: [id number]  Date: 06/07/2017  DOB: [birth year]  SPECIAL TREATMENT PROCEDURE  Brain metastases ([place])    ICD-10-CM   1. Brain metastases ([place]) C79.31     3D TREATMENT PLANNING AND DOSIMETRY:  The patient's radiation plan was reviewed and approved by neurosurgery and radiation oncology prior to treatment.  It showed 3-dimensional radiation distributions overlaid onto the planning CT/MRI image set.  [facility] for the target structures as well as the organs at risk were reviewed. The documentation of the 3D plan and dosimetry are filed in the radiation oncology EMR.  NARRATIVE:  Alexander Fry was brought to the TrueBeam stereotactic radiation treatment machine and placed supine on the CT couch. The head frame was applied, and the patient was set up for stereotactic radiosurgery.  Neurosurgery was present for the set-up and delivery  SIMULATION VERIFICATION:  In the couch zero-angle position, the patient underwent Exactrac imaging using the Brainlab system with orthogonal KV images.  These were carefully aligned and repeated to confirm treatment position for each of the isocenters.  The Exactrac snap film verification was repeated at each couch angle.  SPECIAL TREATMENT PROCEDURE: Alexander Fry received stereotactic radiosurgery to the following targets: 15 targets (orginially intended on 14 targets but an additional lesion was appreciated during RT planning)  were treated using 6 Rapid Arc VMAT Beams to a prescription dose of 20 Gy.  ExacTrac Snap verification was performed for each couch angle.  This constitutes a special treatment procedure due to the ablative dose delivered and the technical nature of treatment.  This highly technical modality of treatment ensures that the ablative dose is centered on the patient's tumor while sparing normal tissues from excessive dose  and risk of detrimental effects.  STEREOTACTIC TREATMENT MANAGEMENT:  Following delivery, the patient was transported to nursing in stable condition and monitored for possible acute effects.  Vital signs were recorded BP 131/79   Pulse 81   Temp 98.3 F (36.8 C)   SpO2 99% Comment: room air. The patient tolerated treatment without significant acute effects, and was discharged to home in stable condition.    PLAN: Follow-up in one month.  ________________________________   [name], MD

## 2017-06-17 ENCOUNTER — Encounter: Payer: Self-pay | Admitting: Radiation Oncology

## 2017-06-17 NOTE — Progress Notes (Signed)
  Radiation Oncology         [phone] ________________________________  Name: Alexander Fry MRN: [id number]  Date: 06/17/2017  DOB: 1961/01/22  End of Treatment Note  Diagnosis:   Metastatic renal cell carcinoma to the brain and lumbar spine  Indication for treatment:  Palliative       Radiation treatment dates:   06/07/17  Site/dose:   Brain metastases treated to 20 Gy in 1 fraction; 15 targets (orginially intended on 14 targets but an additional lesion was appreciated during RT planning)  were treated using 6 Rapid Arc VMAT Beams to a prescription dose of 20 Gy.   Beams/energy:   3D SBRT/SRT-VMAT // 6FFF photons  Narrative: The patient tolerated radiation treatment relatively well.   Overall the patient was without complaint.  Plan: The patient has completed radiation treatment. The patient will return to radiation oncology clinic for routine followup in one month. I advised them to call or return sooner if they have any questions or concerns related to their recovery or treatment.  -----------------------------------  [name], MD  This document serves as a record of services personally performed by [name], MD. It was created on her behalf by Maryla Morrow, a trained medical scribe. The creation of this record is based on the scribe's personal observations and the provider's statements to them. This document has been checked and approved by the attending provider.

## 2017-06-20 ENCOUNTER — Other Ambulatory Visit (HOSPITAL_BASED_OUTPATIENT_CLINIC_OR_DEPARTMENT_OTHER): Payer: BLUE CROSS/BLUE SHIELD

## 2017-06-20 ENCOUNTER — Ambulatory Visit (HOSPITAL_BASED_OUTPATIENT_CLINIC_OR_DEPARTMENT_OTHER): Payer: BLUE CROSS/BLUE SHIELD | Admitting: Hematology & Oncology

## 2017-06-20 VITALS — BP 120/69 | HR 92 | Temp 98.8°F | Resp 17 | Wt 245.0 lb

## 2017-06-20 DIAGNOSIS — C7800 Secondary malignant neoplasm of unspecified lung: Secondary | ICD-10-CM | POA: Diagnosis not present

## 2017-06-20 DIAGNOSIS — C641 Malignant neoplasm of right kidney, except renal pelvis: Secondary | ICD-10-CM

## 2017-06-20 DIAGNOSIS — C7931 Secondary malignant neoplasm of brain: Secondary | ICD-10-CM

## 2017-06-20 DIAGNOSIS — C787 Secondary malignant neoplasm of liver and intrahepatic bile duct: Secondary | ICD-10-CM

## 2017-06-20 DIAGNOSIS — C649 Malignant neoplasm of unspecified kidney, except renal pelvis: Secondary | ICD-10-CM

## 2017-06-20 DIAGNOSIS — C7951 Secondary malignant neoplasm of bone: Secondary | ICD-10-CM

## 2017-06-20 LAB — CBC WITH DIFFERENTIAL (CANCER CENTER ONLY)
BASO#: 0 10*3/uL (ref 0.0–0.2)
BASO%: 0.3 % (ref 0.0–2.0)
EOS%: 1.8 % (ref 0.0–7.0)
Eosinophils Absolute: 0.1 10*3/uL (ref 0.0–0.5)
HEMATOCRIT: 41.5 % (ref 38.7–49.9)
HEMOGLOBIN: 13.9 g/dL (ref 13.0–17.1)
LYMPH#: 2.4 10*3/uL (ref 0.9–3.3)
LYMPH%: 34.1 % (ref 14.0–48.0)
MCH: 30.2 pg (ref 28.0–33.4)
MCHC: 33.5 g/dL (ref 32.0–35.9)
MCV: 90 fL (ref 82–98)
MONO#: 0.8 10*3/uL (ref 0.1–0.9)
MONO%: 11.5 % (ref 0.0–13.0)
NEUT%: 52.3 % (ref 40.0–80.0)
NEUTROS ABS: 3.8 10*3/uL (ref 1.5–6.5)
Platelets: 305 10*3/uL (ref 145–400)
RBC: 4.6 10*6/uL (ref 4.20–5.70)
RDW: 13.5 % (ref 11.1–15.7)
WBC: 7.2 10*3/uL (ref 4.0–10.0)

## 2017-06-20 LAB — CMP (CANCER CENTER ONLY)
ALK PHOS: 220 U/L — AB (ref 26–84)
ALT: 38 U/L (ref 10–47)
AST: 41 U/L — AB (ref 11–38)
Albumin: 3.3 g/dL (ref 3.3–5.5)
BILIRUBIN TOTAL: 0.9 mg/dL (ref 0.20–1.60)
BUN, Bld: 14 mg/dL (ref 7–22)
CALCIUM: 9.7 mg/dL (ref 8.0–10.3)
CO2: 31 mEq/L (ref 18–33)
Chloride: 106 mEq/L (ref 98–108)
Creat: 0.7 mg/dl (ref 0.6–1.2)
GLUCOSE: 131 mg/dL — AB (ref 73–118)
POTASSIUM: 3.7 meq/L (ref 3.3–4.7)
Sodium: 143 mEq/L (ref 128–145)
TOTAL PROTEIN: 8.1 g/dL (ref 6.4–8.1)

## 2017-06-20 LAB — LACTATE DEHYDROGENASE: LDH: 249 U/L — ABNORMAL HIGH (ref 125–245)

## 2017-06-20 MED ORDER — CABOZANTINIB S-MALATE 40 MG PO TABS: 40.0000 mg | ORAL_TABLET | Freq: Every day | ORAL | 3 refills | Status: DC

## 2017-06-20 NOTE — Progress Notes (Signed)
Hematology and Oncology Follow Up Visit  Damonta Cossey [id number] Jun 09, 1961 56 y.o. 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 Ned is here today for follow-up.his liver function tests have normalized. His alkaline phosphatase is still up just a little bit. However, I think we have to get back onto some systemic therapy.  We will try him on Cabometyx. I will time on 40 mg daily. This is not full dose. Hopefully, if he tolerates the 40 mg dose, we can get him on to full dose.  Unfortunately, it looks like he developed more brain metastases. He has 15 brain lesions. He was treated with SRS last week.  MRI of the spine show that the spinal lesions were better. However, there were noted to be some increase in retroperitoneal adenopathy.   He has been having some oral issues. He has been off the [place]. I will hold off on this for right now.   He is doing pretty well pain wise. He's had no nausea or vomiting. He's had no diarrhea. He's had no cough or shortness of breath.   Overall, his performance status is ECOG 1.  Medications:  Allergies as of 06/20/2017   No Known Allergies     Medication List       Accurate as of 06/20/17 11:32 AM. Always use your most recent med list.          amoxicillin 500 MG capsule Commonly known as:  AMOXIL Take 500 mg by mouth 3 (three) times daily.   tamsulosin 0.4 MG Caps capsule Commonly known as:  FLOMAX Take 1 capsule (0.4 mg total) by mouth daily.       Allergies: No Known Allergies  Past Medical History, Surgical history, Social history, and Family History were reviewed and  updated.  Review of Systems: All other 10 point review of systems is negative.   Physical Exam:  weight is 245 lb (111.1 kg). His oral temperature is 98.8 F (37.1 C). His blood pressure is 120/69 and his pulse is 92. His respiration is 17 and oxygen saturation is 94%.   Wt Readings from Last 3 Encounters:  06/20/17 245 lb (111.1 kg)  06/03/17 245 lb 3.2 oz (111.2 kg)  05/26/17 242 lb (109.8 kg)    Head and neck exam shows no scleral icterus. He has no palatal icterus. There is no adenopathy in the neck. Extraocular muscles move well. Lungs are clear. Cardiac exam regular rate and rhythm with no murmurs, rubs or bruits. Abdomen is soft. There is no guarding or rebound tenderness. He has no palpable hepatomegaly. There is no fluid wave. Back exam shows no tenderness over the spine, ribs or hips. Extremities shows no clubbing, cyanosis or edema. Neurological exam shows no focal neurological deficits.   Lab Results  Component Value Date   WBC 7.2 06/20/2017   HGB 13.9 06/20/2017   HCT 41.5 06/20/2017   MCV 90 06/20/2017   PLT 305 06/20/2017   No results found for: FERRITIN, IRON, TIBC, UIBC, IRONPCTSAT Lab Results  Component Value Date   RBC 4.60 06/20/2017   No results found for: KPAFRELGTCHN, LAMBDASER, KAPLAMBRATIO No results found for:  IGGSERUM, IGA, IGMSERUM No results found for: Odetta Pink, SPEI   Chemistry      Component Value Date/Time   NA 143 06/20/2017 1053   NA 136 04/29/2017 0919   K 3.7 06/20/2017 1053   K 3.7 04/29/2017 0919   CL 106 06/20/2017 1053   CO2 31 06/20/2017 1053   CO2 23 04/29/2017 0919   BUN 14 06/20/2017 1053   BUN 7.5 04/29/2017 0919   CREATININE 0.7 06/20/2017 1053   CREATININE 0.7 04/29/2017 0919      Component Value Date/Time   CALCIUM 9.7 06/20/2017 1053   CALCIUM 9.1 04/29/2017 0919   ALKPHOS 220 (H) 06/20/2017 1053   ALKPHOS 803 (H) 04/29/2017 0919   AST 41 (H) 06/20/2017 1053    AST 134 (H) 04/29/2017 0919   ALT 38 06/20/2017 1053   ALT 198 (H) 04/29/2017 0919   BILITOT 0.90 06/20/2017 1053   BILITOT 14.63 ([place]) 04/29/2017 0919      Impression and Plan: Mr. Buchan is a very pleasant 56 yo caucasian gentleman with metastatic renal cell carcinoma. His has had a break from treatment while waiting for his liver enzymes and bilirubin to come down. His numbers continue to improve.   Hopefully, we will get him on Cabometyx this week. We will have to watch his there are function tests very carefully.  If, his liver function tests did go up, then we will have to consider him for immunotherapy.  I spent about 30 minutes with him.  I still want hold off on the Xgeva until we know that his dental issues are resolved.   [name], MD 7/16/201811:32 AM

## 2017-07-06 ENCOUNTER — Encounter: Payer: Self-pay | Admitting: Radiation Oncology

## 2017-07-12 ENCOUNTER — Ambulatory Visit
Admission: RE | Admit: 2017-07-12 | Discharge: 2017-07-12 | Disposition: A | Discharge status: Home / Self Care | Payer: BLUE CROSS/BLUE SHIELD | Source: Ambulatory Visit | Attending: Radiation Oncology | Admitting: Radiation Oncology

## 2017-07-12 ENCOUNTER — Encounter: Payer: Self-pay | Admitting: Radiation Oncology

## 2017-07-12 VITALS — BP 134/82 | HR 78 | Temp 98.5°F | Ht 72.0 in | Wt 244.0 lb

## 2017-07-12 DIAGNOSIS — Z08 Encounter for follow-up examination after completed treatment for malignant neoplasm: Secondary | ICD-10-CM | POA: Diagnosis present

## 2017-07-12 DIAGNOSIS — Z79899 Other long term (current) drug therapy: Secondary | ICD-10-CM | POA: Insufficient documentation

## 2017-07-12 DIAGNOSIS — Z923 Personal history of irradiation: Secondary | ICD-10-CM | POA: Diagnosis not present

## 2017-07-12 DIAGNOSIS — C7931 Secondary malignant neoplasm of brain: Secondary | ICD-10-CM | POA: Diagnosis not present

## 2017-07-12 DIAGNOSIS — C7951 Secondary malignant neoplasm of bone: Secondary | ICD-10-CM

## 2017-07-12 NOTE — Progress Notes (Signed)
Radiation Oncology         [phone] ________________________________  Name: Alexander Fry MRN: [id number]  Date: 07/12/2017  DOB: November 28, 1961  Follow-Up Visit Note  Outpatient  CC: Alexander Dredge, PA-C  Alexander Napoleon, MD  Diagnosis:   Metastatic renal cell carcinoma to the brain and lumbar spine.     ICD-10-CM   1. Bone metastases ([facility]) C79.51   2. Brain metastases ([place]) C79.31     CHIEF COMPLAINT: Here for follow-up and surveillance of metastatic renal carcinoma.   Narrative:  The patient returns today for routine follow-up 1 month since radiosurgery to the brain. Symptomatically, denies  seizures, headaches, nausea, new neurologic deficits, visual changes. Patient complains of stable manageable back pain that improved since radiosurgery, sinus drainage issues, and peeling of his skin on forehead and around his nose and cheeks, he described it as a sunburn (after SRS, since resolved).   Medical oncology's plans for the patient are to continue: Cabometyx which was started on 06/22/2017 ; Votrient was stopped on 04/15/2017 due to hepatoxcity.    ALLERGIES:  has No Known Allergies.  Meds: Current Outpatient Prescriptions  Medication Sig Dispense Refill  . amoxicillin (AMOXIL) 500 MG capsule Take 500 mg by mouth 3 (three) times daily.    . cabozantinib S-Malate (CABOMETYX) 40 MG TABS Take 40 mg by mouth daily. 30 tablet 3  . tamsulosin (FLOMAX) 0.4 MG CAPS capsule Take 1 capsule (0.4 mg total) by mouth daily. 30 capsule 0   No current facility-administered medications for this encounter.     Physical Findings: Vitals:   07/12/17 1403  BP: 134/82  Pulse: 78  Temp: 98.5 F (36.9 C)    The patient is in no acute distress. Patient is alert and oriented. No significant changes. General: Alert and oriented, in no acute distress HEENT: Head is normocephalic. Extraocular movements are intact. Oropharynx is clear. Neck: Neck is supple, no palpable cervical or  supraclavicular lymphadenopathy. No pain or soreness in the maxillary sinuses. Heart: Regular in rate and rhythm with no murmurs, rubs, or gallops. Chest: Clear to auscultation bilaterally, with no rhonchi, wheezes, or rales. Abdomen: Soft, nontender, nondistended, with no rigidity or guarding. Extremities: No cyanosis or edema. Lymphatics: see Neck Exam Skin: No concerning lesions. Musculoskeletal: symmetric strength and muscle tone throughout. Neurologic: Cranial nerves II through XII are grossly intact. No obvious focalities. Speech is fluent. Coordination is intact. Psychiatric: Judgment and insight are intact. Affect is appropriate.   Lab Findings: Lab Results  Component Value Date   WBC 7.2 06/20/2017   HGB 13.9 06/20/2017   HCT 41.5 06/20/2017   MCV 90 06/20/2017   PLT 305 06/20/2017    Radiographic Findings: No results found.  Impression/Plan: doing well neurologically and symptomatically overall  Patient has a 1 month history of continuing sinus discharge of the maxillary sinuses, MRI  from 05/31/2017 revealed mild mucosal thickening and retention cyst along the maxillary sinus floors.  He will discuss with PCP if the symptoms bother him.  ENT consult to be considered if PCP wishes.  Patient will have a repeat MRI scan of the brain +/- spine and meet with Dr. Vertell Fry in 73mo. Patient will follow up with me in future - timing depending on Tumor board recommendations. Alexander Fry will navigate appts and scheduling.    _____________________________________   [name], MD This document serves as a record of services personally performed by [name], MD. It was created on her behalf by [name], a  trained medical scribe. The creation of this record is based on the scribe's personal observations and the provider's statements to them. This document has been checked and approved by the attending provider.

## 2017-07-12 NOTE — Progress Notes (Signed)
Alexander Fry is here for follow up of SBRT/SRT-VMAT to his Brain in 1 fraction on 06/07/17. He denies pain or fatigue. He does thick mucous that he can't clear that has occurred since 06/07/17 after his radiation. He is taking 10 mg Zyrtec 3 times daily to dry the secretions. He also reports having peeling to his forehead, nose and below his eyes after his radiation treatment. He denies any other concerns at this time.   BP 134/82   Pulse 78   Temp 98.5 F (36.9 C)   Ht 6' (1.829 m)   Wt 244 lb (110.7 kg)   SpO2 98% Comment: room air  BMI 33.09 kg/m    Wt Readings from Last 3 Encounters:  07/12/17 244 lb (110.7 kg)  06/20/17 245 lb (111.1 kg)  06/03/17 245 lb 3.2 oz (111.2 kg)

## 2017-07-14 ENCOUNTER — Ambulatory Visit (HOSPITAL_BASED_OUTPATIENT_CLINIC_OR_DEPARTMENT_OTHER): Payer: BLUE CROSS/BLUE SHIELD | Admitting: Hematology & Oncology

## 2017-07-14 ENCOUNTER — Other Ambulatory Visit (HOSPITAL_BASED_OUTPATIENT_CLINIC_OR_DEPARTMENT_OTHER): Payer: BLUE CROSS/BLUE SHIELD

## 2017-07-14 VITALS — BP 125/84 | HR 68 | Temp 98.2°F | Resp 19 | Wt 244.0 lb

## 2017-07-14 DIAGNOSIS — C7951 Secondary malignant neoplasm of bone: Secondary | ICD-10-CM | POA: Diagnosis not present

## 2017-07-14 DIAGNOSIS — R945 Abnormal results of liver function studies: Secondary | ICD-10-CM

## 2017-07-14 DIAGNOSIS — C641 Malignant neoplasm of right kidney, except renal pelvis: Secondary | ICD-10-CM

## 2017-07-14 DIAGNOSIS — C649 Malignant neoplasm of unspecified kidney, except renal pelvis: Secondary | ICD-10-CM

## 2017-07-14 DIAGNOSIS — C787 Secondary malignant neoplasm of liver and intrahepatic bile duct: Secondary | ICD-10-CM | POA: Diagnosis not present

## 2017-07-14 DIAGNOSIS — C7800 Secondary malignant neoplasm of unspecified lung: Secondary | ICD-10-CM | POA: Diagnosis not present

## 2017-07-14 LAB — CMP (CANCER CENTER ONLY)
ALT(SGPT): 63 U/L — ABNORMAL HIGH (ref 10–47)
AST: 63 U/L — ABNORMAL HIGH (ref 11–38)
Albumin: 3.1 g/dL — ABNORMAL LOW (ref 3.3–5.5)
Alkaline Phosphatase: 284 U/L — ABNORMAL HIGH (ref 26–84)
BILIRUBIN TOTAL: 0.7 mg/dL (ref 0.20–1.60)
BUN, Bld: 11 mg/dL (ref 7–22)
CHLORIDE: 103 meq/L (ref 98–108)
CO2: 29 meq/L (ref 18–33)
CREATININE: 0.7 mg/dL (ref 0.6–1.2)
Calcium: 9 mg/dL (ref 8.0–10.3)
GLUCOSE: 85 mg/dL (ref 73–118)
Potassium: 3.7 mEq/L (ref 3.3–4.7)
Sodium: 138 mEq/L (ref 128–145)
Total Protein: 7.6 g/dL (ref 6.4–8.1)

## 2017-07-14 LAB — CBC WITH DIFFERENTIAL (CANCER CENTER ONLY)
BASO#: 0 10*3/uL (ref 0.0–0.2)
BASO%: 0.5 % (ref 0.0–2.0)
EOS ABS: 0.2 10*3/uL (ref 0.0–0.5)
EOS%: 3.4 % (ref 0.0–7.0)
HCT: 42.7 % (ref 38.7–49.9)
HGB: 14.5 g/dL (ref 13.0–17.1)
LYMPH#: 2.8 10*3/uL (ref 0.9–3.3)
LYMPH%: 42.4 % (ref 14.0–48.0)
MCH: 28.4 pg (ref 28.0–33.4)
MCHC: 34 g/dL (ref 32.0–35.9)
MCV: 84 fL (ref 82–98)
MONO#: 0.5 10*3/uL (ref 0.1–0.9)
MONO%: 7.2 % (ref 0.0–13.0)
NEUT#: 3.1 10*3/uL (ref 1.5–6.5)
NEUT%: 46.5 % (ref 40.0–80.0)
Platelets: 303 10*3/uL (ref 145–400)
RBC: 5.11 10*6/uL (ref 4.20–5.70)
RDW: 13.3 % (ref 11.1–15.7)
WBC: 6.6 10*3/uL (ref 4.0–10.0)

## 2017-07-14 LAB — LACTATE DEHYDROGENASE: LDH: 317 U/L — ABNORMAL HIGH (ref 125–245)

## 2017-07-14 NOTE — Progress Notes (Signed)
Hematology and Oncology Follow Up Visit  Alexander Fry [id number] 27-Oct-1961 56 y.o. 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 His alkaline phosphatase is still up just a little bit. However, I think we have to get back onto some systemic therapy.  So far, he has tolerated the Cabometyx okay. He said no nausea or vomiting. He's had no abdominal pain. This been no diarrhea. He's had no cough. He's had no shortness of breath.  [place]   I think he just saw radiation oncology. They are pleased with how well he is doing.   He's had no mouth sores. He's had no issues with his appetite.  Overall, his performance status is ECOG 1.  Medications:  Allergies as of 07/14/2017   No Known Allergies     Medication List       Accurate as of 07/14/17 11:24 AM. Always use your most recent med list.          cabozantinib S-Malate 40 MG Tabs Commonly known as:  CABOMETYX Take 40 mg by mouth daily.   cetirizine 10 MG tablet Commonly known as:  ZYRTEC Take 10 mg by mouth daily. He is taking it 3 times daily   tamsulosin 0.4 MG Caps capsule Commonly known as:  FLOMAX Take 1 capsule (0.4 mg total) by mouth daily.       Allergies: No Known Allergies  Past Medical History, Surgical history, Social history, and Family History were reviewed and updated.  Review of Systems: All other 10 point review of systems is negative.   Physical Exam:  weight is 244 lb (110.7 kg). His oral temperature is 98.2 F (36.8 C). His  blood pressure is 125/84 and his pulse is 68. His respiration is 19 and oxygen saturation is 93%.   Wt Readings from Last 3 Encounters:  07/14/17 244 lb (110.7 kg)  07/12/17 244 lb (110.7 kg)  06/20/17 245 lb (111.1 kg)    Head and neck exam shows no scleral icterus. He has no palatal icterus. There is no adenopathy in the neck. Extraocular muscles move well. Lungs are clear. Cardiac exam regular rate and rhythm with no murmurs, rubs or bruits. Abdomen is soft. There is no guarding or rebound tenderness. He has no palpable hepatomegaly. There is no fluid wave. Back exam shows no tenderness over the spine, ribs or hips. Extremities shows no clubbing, cyanosis or edema. Neurological exam shows no focal neurological deficits.   Lab Results  Component Value Date   WBC 6.6 07/14/2017   HGB 14.5 07/14/2017   HCT 42.7 07/14/2017   MCV 84 07/14/2017   PLT 303 07/14/2017   No results found for: FERRITIN, IRON, TIBC, UIBC, IRONPCTSAT Lab Results  Component Value Date   RBC 5.11 07/14/2017   No results found for: KPAFRELGTCHN, LAMBDASER, KAPLAMBRATIO No results found for: IGGSERUM, IGA, IGMSERUM No results found for: Ronnald Ramp, A1GS, A2GS, Violet Baldy, MSPIKE, SPEI   Chemistry      Component Value Date/Time   NA 138 07/14/2017 0911  NA 136 04/29/2017 0919   K 3.7 07/14/2017 0911   K 3.7 04/29/2017 0919   CL 103 07/14/2017 0911   CO2 29 07/14/2017 0911   CO2 23 04/29/2017 0919   BUN 11 07/14/2017 0911   BUN 7.5 04/29/2017 0919   CREATININE 0.7 07/14/2017 0911   CREATININE 0.7 04/29/2017 0919      Component Value Date/Time   CALCIUM 9.0 07/14/2017 0911   CALCIUM 9.1 04/29/2017 0919   ALKPHOS 284 (H) 07/14/2017 0911   ALKPHOS 803 (H) 04/29/2017 0919   AST 63 (H) 07/14/2017 0911   AST 134 (H) 04/29/2017 0919   ALT 63 (H) 07/14/2017 0911   ALT 198 (H) 04/29/2017 0919   BILITOT 0.70 07/14/2017 0911   BILITOT 14.63 ([place]) 04/29/2017 0919      Impression and  Plan: Alexander Fry is a very pleasant 56 yo caucasian gentleman with metastatic renal cell carcinoma.  Unfortunately, I have a feeling that his liver tests will continue to rise. I just have a suspicion that he just may be one of these patients that just cannot tolerate TKI therapy.  I will have him come back in 1 week for lab work. If his liver tests continue to elevate, then we will have to get him off Cabometyx and I would then consider him for immunotherapy with Nivolumab/Yervoy.  I spent about 30 minutes with him today.   [name], MD 8/9/201811:24 AM

## 2017-07-20 ENCOUNTER — Other Ambulatory Visit (HOSPITAL_BASED_OUTPATIENT_CLINIC_OR_DEPARTMENT_OTHER): Payer: BLUE CROSS/BLUE SHIELD

## 2017-07-20 ENCOUNTER — Encounter: Payer: Self-pay | Admitting: *Deleted

## 2017-07-20 ENCOUNTER — Other Ambulatory Visit: Payer: Self-pay | Admitting: *Deleted

## 2017-07-20 DIAGNOSIS — C641 Malignant neoplasm of right kidney, except renal pelvis: Secondary | ICD-10-CM

## 2017-07-20 DIAGNOSIS — C649 Malignant neoplasm of unspecified kidney, except renal pelvis: Secondary | ICD-10-CM

## 2017-07-20 LAB — COMPREHENSIVE METABOLIC PANEL
ALT: 59 U/L — ABNORMAL HIGH (ref 0–55)
AST: 52 U/L — AB (ref 5–34)
Albumin: 3.3 g/dL — ABNORMAL LOW (ref 3.5–5.0)
Alkaline Phosphatase: 291 U/L — ABNORMAL HIGH (ref 40–150)
Anion Gap: 10 mEq/L (ref 3–11)
BUN: 11.5 mg/dL (ref 7.0–26.0)
CHLORIDE: 103 meq/L (ref 98–109)
CO2: 27 mEq/L (ref 22–29)
Calcium: 9.9 mg/dL (ref 8.4–10.4)
Creatinine: 0.7 mg/dL (ref 0.7–1.3)
EGFR: >90 mL/min/{1.73_m2} (ref >90)
GLUCOSE: 94 mg/dL (ref 70–140)
POTASSIUM: 4.5 meq/L (ref 3.5–5.1)
SODIUM: 139 meq/L (ref 136–145)
Total Bilirubin: 0.6 mg/dL (ref 0.20–1.20)
Total Protein: 8 g/dL (ref 6.4–8.3)

## 2017-07-20 LAB — CBC WITH DIFFERENTIAL (CANCER CENTER ONLY)
BASO#: 0 10*3/uL (ref 0.0–0.2)
BASO%: 0.1 % (ref 0.0–2.0)
EOS%: 3.1 % (ref 0.0–7.0)
Eosinophils Absolute: 0.2 10*3/uL (ref 0.0–0.5)
HEMATOCRIT: 45.6 % (ref 38.7–49.9)
HGB: 15.7 g/dL (ref 13.0–17.1)
LYMPH#: 3.3 10*3/uL (ref 0.9–3.3)
LYMPH%: 49.3 % — AB (ref 14.0–48.0)
MCH: 28.5 pg (ref 28.0–33.4)
MCHC: 34.4 g/dL (ref 32.0–35.9)
MCV: 83 fL (ref 82–98)
MONO#: 0.5 10*3/uL (ref 0.1–0.9)
MONO%: 7.5 % (ref 0.0–13.0)
NEUT#: 2.7 10*3/uL (ref 1.5–6.5)
NEUT%: 40 % (ref 40.0–80.0)
PLATELETS: 259 10*3/uL (ref 145–400)
RBC: 5.5 10*6/uL (ref 4.20–5.70)
RDW: 13.7 % (ref 11.1–15.7)
WBC: 6.8 10*3/uL (ref 4.0–10.0)

## 2017-07-20 LAB — LACTATE DEHYDROGENASE: LDH: 277 U/L — ABNORMAL HIGH (ref 125–245)

## 2017-08-24 ENCOUNTER — Other Ambulatory Visit: Payer: BLUE CROSS/BLUE SHIELD

## 2017-08-24 ENCOUNTER — Ambulatory Visit: Payer: BLUE CROSS/BLUE SHIELD | Admitting: Hematology & Oncology

## 2017-08-26 ENCOUNTER — Other Ambulatory Visit: Payer: Self-pay

## 2017-08-26 DIAGNOSIS — C641 Malignant neoplasm of right kidney, except renal pelvis: Secondary | ICD-10-CM

## 2017-08-29 ENCOUNTER — Ambulatory Visit (HOSPITAL_BASED_OUTPATIENT_CLINIC_OR_DEPARTMENT_OTHER): Payer: BLUE CROSS/BLUE SHIELD

## 2017-08-29 ENCOUNTER — Other Ambulatory Visit: Payer: Self-pay

## 2017-08-29 ENCOUNTER — Ambulatory Visit (HOSPITAL_BASED_OUTPATIENT_CLINIC_OR_DEPARTMENT_OTHER): Payer: BLUE CROSS/BLUE SHIELD | Admitting: Hematology & Oncology

## 2017-08-29 ENCOUNTER — Other Ambulatory Visit (HOSPITAL_BASED_OUTPATIENT_CLINIC_OR_DEPARTMENT_OTHER): Payer: BLUE CROSS/BLUE SHIELD

## 2017-08-29 VITALS — BP 140/88 | HR 75 | Temp 98.7°F | Resp 18 | Wt 240.0 lb

## 2017-08-29 DIAGNOSIS — C649 Malignant neoplasm of unspecified kidney, except renal pelvis: Secondary | ICD-10-CM

## 2017-08-29 DIAGNOSIS — C641 Malignant neoplasm of right kidney, except renal pelvis: Secondary | ICD-10-CM

## 2017-08-29 DIAGNOSIS — C779 Secondary and unspecified malignant neoplasm of lymph node, unspecified: Secondary | ICD-10-CM | POA: Diagnosis not present

## 2017-08-29 DIAGNOSIS — C787 Secondary malignant neoplasm of liver and intrahepatic bile duct: Secondary | ICD-10-CM

## 2017-08-29 DIAGNOSIS — C7951 Secondary malignant neoplasm of bone: Secondary | ICD-10-CM | POA: Diagnosis not present

## 2017-08-29 DIAGNOSIS — C7800 Secondary malignant neoplasm of unspecified lung: Secondary | ICD-10-CM | POA: Diagnosis not present

## 2017-08-29 LAB — CMP (CANCER CENTER ONLY)
ALBUMIN: 3.6 g/dL (ref 3.3–5.5)
ALK PHOS: 209 U/L — AB (ref 26–84)
ALT: 58 U/L — AB (ref 10–47)
AST: 48 U/L — AB (ref 11–38)
BILIRUBIN TOTAL: 0.9 mg/dL (ref 0.20–1.60)
BUN, Bld: 7 mg/dL (ref 7–22)
CO2: 30 mEq/L (ref 18–33)
CREATININE: 0.8 mg/dL (ref 0.6–1.2)
Calcium: 9.2 mg/dL (ref 8.0–10.3)
Chloride: 103 mEq/L (ref 98–108)
Glucose, Bld: 95 mg/dL (ref 73–118)
Potassium: 4 mEq/L (ref 3.3–4.7)
SODIUM: 142 meq/L (ref 128–145)
TOTAL PROTEIN: 7.1 g/dL (ref 6.4–8.1)

## 2017-08-29 LAB — CBC WITH DIFFERENTIAL (CANCER CENTER ONLY)
BASO#: 0 10*3/uL (ref 0.0–0.2)
BASO%: 0.2 % (ref 0.0–2.0)
EOS ABS: 0.1 10*3/uL (ref 0.0–0.5)
EOS%: 2.3 % (ref 0.0–7.0)
HEMATOCRIT: 44.7 % (ref 38.7–49.9)
HEMOGLOBIN: 15.7 g/dL (ref 13.0–17.1)
LYMPH#: 2.9 10*3/uL (ref 0.9–3.3)
LYMPH%: 54.8 % — ABNORMAL HIGH (ref 14.0–48.0)
MCH: 28.5 pg (ref 28.0–33.4)
MCHC: 35.1 g/dL (ref 32.0–35.9)
MCV: 81 fL — ABNORMAL LOW (ref 82–98)
MONO#: 0.4 10*3/uL (ref 0.1–0.9)
MONO%: 7.5 % (ref 0.0–13.0)
NEUT%: 35.2 % — ABNORMAL LOW (ref 40.0–80.0)
NEUTROS ABS: 1.9 10*3/uL (ref 1.5–6.5)
PLATELETS: 185 10*3/uL (ref 145–400)
RBC: 5.51 10*6/uL (ref 4.20–5.70)
RDW: 16.6 % — ABNORMAL HIGH (ref 11.1–15.7)
WBC: 5.3 10*3/uL (ref 4.0–10.0)

## 2017-08-29 LAB — MAGNESIUM: MAGNESIUM: 2.2 mg/dL (ref 1.5–2.5)

## 2017-08-29 MED ORDER — DENOSUMAB 120 MG/1.7ML ~~LOC~~ SOLN
120.0000 mg | Freq: Once | SUBCUTANEOUS | Status: AC
  Administered 2017-08-29: 120 mg via SUBCUTANEOUS

## 2017-08-29 MED ORDER — DENOSUMAB 120 MG/1.7ML ~~LOC~~ SOLN
SUBCUTANEOUS | Status: AC
  Filled 2017-08-29: qty 1.7

## 2017-08-29 NOTE — Patient Instructions (Signed)
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
## 2017-08-31 ENCOUNTER — Other Ambulatory Visit: Payer: Self-pay | Admitting: Radiation Therapy

## 2017-08-31 DIAGNOSIS — C7951 Secondary malignant neoplasm of bone: Secondary | ICD-10-CM

## 2017-08-31 DIAGNOSIS — C7931 Secondary malignant neoplasm of brain: Secondary | ICD-10-CM

## 2017-09-07 ENCOUNTER — Encounter: Payer: Self-pay | Admitting: Hematology & Oncology

## 2017-09-16 ENCOUNTER — Other Ambulatory Visit: Payer: BLUE CROSS/BLUE SHIELD

## 2017-09-28 ENCOUNTER — Encounter (HOSPITAL_COMMUNITY): Payer: BLUE CROSS/BLUE SHIELD

## 2017-09-30 ENCOUNTER — Inpatient Hospital Stay: Admission: RE | Admit: 2017-09-30 | Payer: BLUE CROSS/BLUE SHIELD | Source: Ambulatory Visit

## 2017-10-13 ENCOUNTER — Encounter: Payer: Self-pay | Admitting: Hematology & Oncology

## 2017-10-13 ENCOUNTER — Other Ambulatory Visit (HOSPITAL_BASED_OUTPATIENT_CLINIC_OR_DEPARTMENT_OTHER): Payer: BLUE CROSS/BLUE SHIELD

## 2017-10-13 ENCOUNTER — Ambulatory Visit (HOSPITAL_BASED_OUTPATIENT_CLINIC_OR_DEPARTMENT_OTHER): Payer: BLUE CROSS/BLUE SHIELD | Admitting: Hematology & Oncology

## 2017-10-13 ENCOUNTER — Other Ambulatory Visit: Payer: Self-pay

## 2017-10-13 VITALS — BP 140/96 | HR 89 | Temp 98.6°F | Resp 18 | Wt 234.0 lb

## 2017-10-13 DIAGNOSIS — C641 Malignant neoplasm of right kidney, except renal pelvis: Secondary | ICD-10-CM

## 2017-10-13 DIAGNOSIS — C7951 Secondary malignant neoplasm of bone: Secondary | ICD-10-CM | POA: Diagnosis not present

## 2017-10-13 DIAGNOSIS — C649 Malignant neoplasm of unspecified kidney, except renal pelvis: Secondary | ICD-10-CM | POA: Diagnosis not present

## 2017-10-13 DIAGNOSIS — C7800 Secondary malignant neoplasm of unspecified lung: Secondary | ICD-10-CM

## 2017-10-13 DIAGNOSIS — C779 Secondary and unspecified malignant neoplasm of lymph node, unspecified: Secondary | ICD-10-CM | POA: Diagnosis not present

## 2017-10-13 DIAGNOSIS — C787 Secondary malignant neoplasm of liver and intrahepatic bile duct: Secondary | ICD-10-CM | POA: Diagnosis not present

## 2017-10-13 LAB — CBC WITH DIFFERENTIAL (CANCER CENTER ONLY)
BASO#: 0 10*3/uL (ref 0.0–0.2)
BASO%: 0.1 % (ref 0.0–2.0)
EOS%: 0.9 % (ref 0.0–7.0)
Eosinophils Absolute: 0.1 10*3/uL (ref 0.0–0.5)
HCT: 43.1 % (ref 38.7–49.9)
HGB: 15.3 g/dL (ref 13.0–17.1)
LYMPH#: 3.1 10*3/uL (ref 0.9–3.3)
LYMPH%: 44.2 % (ref 14.0–48.0)
MCH: 30.2 pg (ref 28.0–33.4)
MCHC: 35.5 g/dL (ref 32.0–35.9)
MCV: 85 fL (ref 82–98)
MONO#: 0.5 10*3/uL (ref 0.1–0.9)
MONO%: 6.4 % (ref 0.0–13.0)
NEUT#: 3.4 10*3/uL (ref 1.5–6.5)
NEUT%: 48.4 % (ref 40.0–80.0)
Platelets: 233 10*3/uL (ref 145–400)
RBC: 5.07 10*6/uL (ref 4.20–5.70)
RDW: 19.1 % — ABNORMAL HIGH (ref 11.1–15.7)
WBC: 7 10*3/uL (ref 4.0–10.0)

## 2017-10-13 LAB — COMPREHENSIVE METABOLIC PANEL
ALT: 34 U/L (ref 0–55)
AST: 38 U/L — ABNORMAL HIGH (ref 5–34)
Albumin: 3.9 g/dL (ref 3.5–5.0)
Alkaline Phosphatase: 181 U/L — ABNORMAL HIGH (ref 40–150)
Anion Gap: 9 mEq/L (ref 3–11)
BUN: 9.1 mg/dL (ref 7.0–26.0)
CO2: 25 mEq/L (ref 22–29)
Calcium: 9.2 mg/dL (ref 8.4–10.4)
Chloride: 106 mEq/L (ref 98–109)
Creatinine: 0.7 mg/dL (ref 0.7–1.3)
EGFR: >60 mL/min/{1.73_m2} (ref >60)
Glucose: 101 mg/dl (ref 70–140)
Potassium: 3.6 mEq/L (ref 3.5–5.1)
Sodium: 141 mEq/L (ref 136–145)
Total Bilirubin: 1.02 mg/dL (ref 0.20–1.20)
Total Protein: 7.4 g/dL (ref 6.4–8.3)

## 2017-10-13 LAB — LACTATE DEHYDROGENASE: LDH: 295 U/L — ABNORMAL HIGH (ref 125–245)

## 2017-10-13 NOTE — Progress Notes (Signed)
Hematology and Oncology Follow Up Visit  Alexander Fry [id number] 1961/05/27 56 y.o. 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  He seems to be doing fairly well.  He is heading back up to [facility] tomorrow.  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  We had to reschedule his PET scan because of the death in his family.  The PET scan will be done on November 17.  He has had no nausea or vomiting.  He has had no diarrhea.  He has had no cough or shortness of breath.  He has developed some visible bone in his mandible on the left side.  I think this might be from his treatments with Xgeva.  As such, we are going to have to put a hold on the Xgeva.  His liver function tests has, so far, been doing pretty well.  He has had no fever.  He has had no rashes.  Has had no leg swelling.  Overall, his performance status is ECOG 0.   Medications:  Allergies as of 10/13/2017   No Known Allergies     Medication List        Accurate as of 10/13/17 12:34 PM. Always use your most recent med list.          cabozantinib S-Malate 40 MG Tabs Commonly known as:  CABOMETYX Take 40 mg by mouth daily.   cetirizine 10 MG tablet Commonly known as:  ZYRTEC Take 10 mg by mouth daily. He is taking it 3 times daily   tamsulosin 0.4 MG Caps capsule Commonly known as:  FLOMAX Take 1 capsule (0.4 mg total) by mouth daily.       Allergies: No Known Allergies  Past Medical History, Surgical history, Social history, and  Family History were reviewed and updated.  Review of Systems: As stated in the interim history  Physical Exam:  weight is 234 lb (106.1 kg). His oral temperature is 98.6 F (37 C). His blood pressure is 140/96 (abnormal) and his pulse is 89. His respiration is 18 and oxygen saturation is 98%.   Wt Readings from Last 3 Encounters:  10/13/17 234 lb (106.1 kg)  08/29/17 240 lb (108.9 kg)  07/14/17 244 lb (110.7 kg)    I examined Alexander Fry. The exam results are as stated below with appropriate corrections:   Head and neck exam shows no scleral icterus. He has no palatal icterus. There is no adenopathy in the neck. Extraocular muscles move well. Lungs are clear. Cardiac exam regular rate and rhythm with no murmurs, rubs or bruits. Abdomen is soft. There is no guarding or rebound tenderness. He has no palpable hepatomegaly. There is no fluid wave. Back exam shows no tenderness over the spine, ribs or hips. Extremities shows no clubbing, cyanosis or edema. Neurological exam shows no focal neurological deficits.   Lab Results  Component Value Date   WBC 7.0 10/13/2017   HGB 15.3 10/13/2017   HCT 43.1 10/13/2017   MCV 85  10/13/2017   PLT 233 10/13/2017   No results found for: FERRITIN, IRON, TIBC, UIBC, IRONPCTSAT Lab Results  Component Value Date   RBC 5.07 10/13/2017   No results found for: KPAFRELGTCHN, LAMBDASER, KAPLAMBRATIO No results found for: IGGSERUM, IGA, IGMSERUM No results found for: Odetta Pink, SPEI   Chemistry      Component Value Date/Time   NA 142 08/29/2017 1126   NA 139 07/20/2017 0941   K 4.0 08/29/2017 1126   K 4.5 07/20/2017 0941   CL 103 08/29/2017 1126   CO2 30 08/29/2017 1126   CO2 27 07/20/2017 0941   BUN 7 08/29/2017 1126   BUN 11.5 07/20/2017 0941   CREATININE 0.8 08/29/2017 1126   CREATININE 0.7 07/20/2017 0941      Component Value Date/Time   CALCIUM 9.2 08/29/2017 1126   CALCIUM 9.9  07/20/2017 0941   ALKPHOS 209 (H) 08/29/2017 1126   ALKPHOS 291 (H) 07/20/2017 0941   AST 48 (H) 08/29/2017 1126   AST 52 (H) 07/20/2017 0941   ALT 58 (H) 08/29/2017 1126   ALT 59 (H) 07/20/2017 0941   BILITOT 0.90 08/29/2017 1126   BILITOT 0.60 07/20/2017 0941      Impression and Plan: Alexander Fry is a very pleasant 56 yo caucasian gentleman with metastatic renal cell carcinoma.  We will have to see what his PET scan looks like.  I will have to see what his liver function tests look like.  Hopefully, we will find that he is responding to the Cabometyx.  I will plan to see him back in another month.  I want to try to catch him in between trips to [place].    [name], MD 11/8/201812:34 PM

## 2017-10-21 ENCOUNTER — Other Ambulatory Visit: Payer: Self-pay | Admitting: Hematology & Oncology

## 2017-10-21 DIAGNOSIS — C641 Malignant neoplasm of right kidney, except renal pelvis: Secondary | ICD-10-CM

## 2017-10-22 ENCOUNTER — Ambulatory Visit (HOSPITAL_COMMUNITY): Payer: BLUE CROSS/BLUE SHIELD

## 2017-10-25 ENCOUNTER — Encounter (HOSPITAL_COMMUNITY)
Admission: RE | Admit: 2017-10-25 | Discharge: 2017-10-25 | Disposition: A | Discharge status: Home / Self Care | Payer: BLUE CROSS/BLUE SHIELD | Source: Ambulatory Visit | Attending: Hematology & Oncology | Admitting: Hematology & Oncology

## 2017-10-25 DIAGNOSIS — C641 Malignant neoplasm of right kidney, except renal pelvis: Secondary | ICD-10-CM | POA: Diagnosis present

## 2017-10-25 LAB — GLUCOSE, CAPILLARY: GLUCOSE-CAPILLARY: 93 mg/dL (ref 65–99)

## 2017-10-25 MED ORDER — FLUDEOXYGLUCOSE F - 18 (FDG) INJECTION
11.3000 | Freq: Once | INTRAVENOUS | Status: AC | PRN
  Administered 2017-10-25: 11.3 via INTRAVENOUS

## 2017-10-26 ENCOUNTER — Encounter: Payer: Self-pay | Admitting: Hematology & Oncology

## 2017-10-31 ENCOUNTER — Ambulatory Visit
Admission: RE | Admit: 2017-10-31 | Discharge: 2017-10-31 | Disposition: A | Discharge status: Home / Self Care | Payer: BLUE CROSS/BLUE SHIELD | Source: Ambulatory Visit | Attending: Radiation Oncology | Admitting: Radiation Oncology

## 2017-10-31 ENCOUNTER — Telehealth: Payer: Self-pay | Admitting: Hematology & Oncology

## 2017-10-31 ENCOUNTER — Encounter: Payer: Self-pay | Admitting: Hematology & Oncology

## 2017-10-31 DIAGNOSIS — C7931 Secondary malignant neoplasm of brain: Secondary | ICD-10-CM

## 2017-10-31 DIAGNOSIS — C7951 Secondary malignant neoplasm of bone: Secondary | ICD-10-CM

## 2017-10-31 MED ORDER — GADOBENATE DIMEGLUMINE 529 MG/ML IV SOLN: 20.0000 mL | Freq: Once | INTRAVENOUS | Status: DC | PRN

## 2017-10-31 NOTE — Telephone Encounter (Signed)
I left a message for Alexander Fry this afternoon regarding his PET scan.  The PET scan showed a nice decrease in the primary right renal mass.  By my calculations it decreased about 55%.  There is decrease in other areas with a decrease in liver met, adenopathy.  However, there was also noted to be some new areas of hypermetabolic disease.  Overall, I would have to think that this is a stable scan.  I would not make any changes with the Cabometyx that he is taking.  I told him that he can call me if he has any questions.  I apologize for the delay in getting back with him as I was out of the office a few days last week with an illness.Marland Kitchen  Laurey Arrow

## 2017-11-02 NOTE — Progress Notes (Deleted)
Has armband been applied?    Does patient have an allergy to IV contrast dye?:    Has patient ever received premedication for IV contrast dye?:   Does patient take metformin?:   If patient does take metformin when was the last dose:  Date of lab work: 10/13/17 BUN: 9.1 CR: 0.7 EGFR: >60  IV site:   Has IV site been added to flowsheet?

## 2017-11-04 ENCOUNTER — Other Ambulatory Visit: Payer: Self-pay | Admitting: *Deleted

## 2017-11-04 ENCOUNTER — Ambulatory Visit
Admission: RE | Admit: 2017-11-04 | Discharge: 2017-11-04 | Disposition: A | Discharge status: Home / Self Care | Payer: BLUE CROSS/BLUE SHIELD | Source: Ambulatory Visit | Attending: Radiation Oncology | Admitting: Radiation Oncology

## 2017-11-04 VITALS — BP 124/88 | HR 86 | Temp 97.9°F | Ht 72.0 in | Wt 232.4 lb

## 2017-11-04 DIAGNOSIS — Z510 Encounter for antineoplastic radiation therapy: Secondary | ICD-10-CM | POA: Diagnosis present

## 2017-11-04 DIAGNOSIS — C7931 Secondary malignant neoplasm of brain: Secondary | ICD-10-CM

## 2017-11-04 MED ORDER — SODIUM CHLORIDE 0.9% FLUSH
10.0000 mL | Freq: Once | INTRAVENOUS | Status: AC
  Administered 2017-11-04: 10 mL via INTRAVENOUS

## 2017-11-04 NOTE — Progress Notes (Signed)
Has armband been applied?  yes  Does patient have an allergy to IV contrast dye?: No   Has patient ever received premedication for IV contrast dye?: No   Does patient take metformin?:  No  If patient does take metformin when was the last dose: N/A  Date of lab work: 10/13/17 BUN: 9.1 CR: 0.7 EGFR: >60  IV site: LAC  Has IV site been added to flowsheet? Yes  BP 124/88 (BP Location: Left Arm)   Pulse 86   Temp 97.9 F (36.6 C) (Oral)   Ht 6' (1.829 m)   Wt 232 lb 6.4 oz (105.4 kg)   SpO2 96%   BMI 31.52 kg/m   Wt Readings from Last 3 Encounters:  11/04/17 232 lb 6.4 oz (105.4 kg)  10/13/17 234 lb (106.1 kg)  08/29/17 240 lb (108.9 kg)

## 2017-11-07 NOTE — Progress Notes (Signed)
  Name: Kiyaan Haq MRN: [id number]  Date: 11/04/2017  DOB: 1961-10-02  SIMULATION AND TREATMENT PLANNING NOTE    ICD-10-CM   1. Brain metastases [facility]) C79.31       Radiation Oncology         [phone] ________________________________  Name: Tremaine Earwood MRN: [id number]  Date: 11/04/2017  DOB: 1961/01/19  SIMULATION AND TREATMENT PLANNING NOTE    ICD-10-CM   1. Brain metastases ([place]) C79.31     DIAGNOSIS:  As above  NARRATIVE:  The patient was brought to the [facility].  Identity was confirmed.  All relevant records and images related to the planned course of therapy were reviewed.  The patient freely provided informed written consent to proceed with treatment after reviewing the details related to the planned course of therapy. The consent form was witnessed and verified by the simulation staff. Intravenous access was established for contrast administration. Then, the patient was set-up in a stable reproducible supine position for radiation therapy.  A relocatable thermoplastic stereotactic head frame was placed for precise immobilization.  CT images were obtained.  Surface markings were placed.  The CT images were loaded into the planning software and fused with the patient's targeting MRI scan.  Then the target and avoidance structures were contoured.  Treatment planning then occurred.  The radiation prescription was entered and confirmed.  I have requested 3D planning  I have requested a DVH of the following structures: Brain stem, brain, left eye, right eye, lenses, optic chiasm, target volumes, uninvolved brain, and normal tissue.    SPECIAL TREATMENT PROCEDURE:  The planned course of therapy using radiation constitutes a special treatment procedure. Special care is required in the management of this patient for the following reasons:  High dose per fraction requiring special monitoring for increased toxicities of treatment including daily imaging.  The special nature of  the planned course of radiotherapy will require increased physician supervision and oversight to ensure patient's safety with optimal treatment outcomes.  PLAN:  The patient will receive 20 Gy in 1 fraction to the 4 new brain metastases  ________________________________    [name], MD

## 2017-11-11 ENCOUNTER — Ambulatory Visit (HOSPITAL_BASED_OUTPATIENT_CLINIC_OR_DEPARTMENT_OTHER): Payer: BLUE CROSS/BLUE SHIELD | Admitting: Hematology & Oncology

## 2017-11-11 ENCOUNTER — Other Ambulatory Visit: Payer: Self-pay

## 2017-11-11 ENCOUNTER — Other Ambulatory Visit (HOSPITAL_BASED_OUTPATIENT_CLINIC_OR_DEPARTMENT_OTHER): Payer: BLUE CROSS/BLUE SHIELD

## 2017-11-11 ENCOUNTER — Encounter: Payer: Self-pay | Admitting: Hematology & Oncology

## 2017-11-11 VITALS — BP 126/84 | HR 72 | Temp 98.3°F | Resp 18 | Wt 232.0 lb

## 2017-11-11 DIAGNOSIS — C641 Malignant neoplasm of right kidney, except renal pelvis: Secondary | ICD-10-CM

## 2017-11-11 DIAGNOSIS — C7800 Secondary malignant neoplasm of unspecified lung: Secondary | ICD-10-CM

## 2017-11-11 DIAGNOSIS — C7931 Secondary malignant neoplasm of brain: Secondary | ICD-10-CM

## 2017-11-11 DIAGNOSIS — C649 Malignant neoplasm of unspecified kidney, except renal pelvis: Secondary | ICD-10-CM | POA: Diagnosis not present

## 2017-11-11 DIAGNOSIS — Z510 Encounter for antineoplastic radiation therapy: Secondary | ICD-10-CM | POA: Diagnosis not present

## 2017-11-11 DIAGNOSIS — D649 Anemia, unspecified: Secondary | ICD-10-CM | POA: Diagnosis not present

## 2017-11-11 DIAGNOSIS — C779 Secondary and unspecified malignant neoplasm of lymph node, unspecified: Secondary | ICD-10-CM

## 2017-11-11 DIAGNOSIS — C7951 Secondary malignant neoplasm of bone: Secondary | ICD-10-CM

## 2017-11-11 LAB — CBC WITH DIFFERENTIAL (CANCER CENTER ONLY)
BASO#: 0 10*3/uL (ref 0.0–0.2)
BASO%: 0.2 % (ref 0.0–2.0)
EOS%: 5.8 % (ref 0.0–7.0)
Eosinophils Absolute: 0.4 10*3/uL (ref 0.0–0.5)
HEMATOCRIT: 40.8 % (ref 38.7–49.9)
HGB: 14.4 g/dL (ref 13.0–17.1)
LYMPH#: 2.6 10*3/uL (ref 0.9–3.3)
LYMPH%: 42.6 % (ref 14.0–48.0)
MCH: 32.2 pg (ref 28.0–33.4)
MCHC: 35.3 g/dL (ref 32.0–35.9)
MCV: 91 fL (ref 82–98)
MONO#: 0.4 10*3/uL (ref 0.1–0.9)
MONO%: 6 % (ref 0.0–13.0)
NEUT#: 2.8 10*3/uL (ref 1.5–6.5)
NEUT%: 45.4 % (ref 40.0–80.0)
PLATELETS: 201 10*3/uL (ref 145–400)
RBC: 4.47 10*6/uL (ref 4.20–5.70)
RDW: 16.3 % — AB (ref 11.1–15.7)
WBC: 6.2 10*3/uL (ref 4.0–10.0)

## 2017-11-11 LAB — CMP (CANCER CENTER ONLY)
ALK PHOS: 166 U/L — AB (ref 26–84)
ALT: 38 U/L (ref 10–47)
AST: 32 U/L (ref 11–38)
Albumin: 3.3 g/dL (ref 3.3–5.5)
BILIRUBIN TOTAL: 0.8 mg/dL (ref 0.20–1.60)
BUN, Bld: 12 mg/dL (ref 7–22)
CALCIUM: 9.2 mg/dL (ref 8.0–10.3)
CO2: 28 mEq/L (ref 18–33)
Chloride: 107 mEq/L (ref 98–108)
Creat: 0.9 mg/dl (ref 0.6–1.2)
Glucose, Bld: 107 mg/dL (ref 73–118)
POTASSIUM: 3.9 meq/L (ref 3.3–4.7)
Sodium: 147 mEq/L — ABNORMAL HIGH (ref 128–145)
TOTAL PROTEIN: 6.6 g/dL (ref 6.4–8.1)

## 2017-11-11 NOTE — Progress Notes (Signed)
Hematology and Oncology Follow Up Visit  Alexander Fry [id number] October 15, 1961 56 y.o. 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  He is doing pretty well.  He has had no problems with respect to pain.  He is still working.  He has had no issues with nausea or vomiting.  He is tolerated the Carbometyx quite nicely.  We did go ahead and do a PET scan on him.  This was done a couple weeks ago.  To me, the PET scan showed a response.  He had a nice decrease in his primary renal mass.  It now measures 6.4 x 5.3 cm.  The SUV is 10.  He had a decrease in his liver lesion.  He had decrease in some retroperitoneal lymph nodes.  There were a couple new hypermetabolic lung lesions.  He had stable bone disease.  Overall, I think that he has responded.  Unfortunately, he has a 4 new brain metastasis.  He is undergoing SRS next week.  He has had no problems with bleeding.  He has had no change in bowel or bladder habits.  He has had no headache.  There is no visual changes.  Overall, his performance status is ECOG 0.   Medications:  Allergies as of 11/11/2017   No Known Allergies     Medication List        Accurate as of 11/11/17  2:28 PM. Always use your most recent med list.          CABOMETYX 40 MG Tabs Generic drug:  cabozantinib S-Malate TAKE 40MG  TABLET BY MOUTH EVERY DAY   cetirizine 10 MG tablet Commonly known as:  ZYRTEC Take 10 mg by mouth daily. He is taking it 3 times daily   tamsulosin 0.4 MG Caps capsule Commonly known as:  FLOMAX Take 1 capsule  (0.4 mg total) by mouth daily.       Allergies: No Known Allergies  Past Medical History, Surgical history, Social history, and Family History were reviewed and updated.  Review of Systems: As stated in the interim history  Physical Exam:  weight is 232 lb (105.2 kg). His oral temperature is 98.3 F (36.8 C). His blood pressure is 126/84 and his pulse is 72. His respiration is 18 and oxygen saturation is 99%.   Wt Readings from Last 3 Encounters:  11/11/17 232 lb (105.2 kg)  11/04/17 232 lb 6.4 oz (105.4 kg)  10/13/17 234 lb (106.1 kg)    I examined [name]. The exam results are as stated below with appropriate corrections:   Head and neck exam shows no scleral icterus. He has no palatal icterus. There is no adenopathy in the neck. Extraocular muscles move well. Lungs are clear. Cardiac exam regular rate and rhythm with no murmurs, rubs or bruits. Abdomen is soft. There is no guarding or rebound tenderness. He has no palpable hepatomegaly. There is no fluid wave. Back exam shows no tenderness over the spine, ribs or hips. Extremities shows no clubbing, cyanosis or edema. Neurological exam shows no focal neurological deficits.  Lab Results  Component Value Date   WBC 6.2 11/11/2017   HGB 14.4 11/11/2017   HCT 40.8 11/11/2017   MCV 91 11/11/2017   PLT 201 11/11/2017   No results found for: FERRITIN, IRON, TIBC, UIBC, IRONPCTSAT Lab Results  Component Value Date   RBC 4.47 11/11/2017   No results found for: KPAFRELGTCHN, LAMBDASER, KAPLAMBRATIO No results found for: IGGSERUM, IGA, IGMSERUM No results found for: Odetta Pink, SPEI   Chemistry      Component Value Date/Time   NA 141 10/13/2017 1118   K 3.6 10/13/2017 1118   CL 103 08/29/2017 1126   CO2 25 10/13/2017 1118   BUN 9.1 10/13/2017 1118   CREATININE 0.7 10/13/2017 1118      Component Value Date/Time   CALCIUM 9.2 10/13/2017 1118   ALKPHOS 181 (H)  10/13/2017 1118   AST 38 (H) 10/13/2017 1118   ALT 34 10/13/2017 1118   BILITOT 1.02 10/13/2017 1118      Impression and Plan: Alexander Fry is a very pleasant 56 yo caucasian gentleman with metastatic renal cell carcinoma.  Overall, I think that things are going quite well.  Is been almost a year since he was initially diagnosed.  He is tolerating the Carbometyx well.  Liver function tests are normal.  His alkaline phosphatase is coming down.  I do not think we need another PET scan until February.  The Xgeva still on hold.  He does have some jaw issues that I do not want to aggravate with Xgeva.  Overall, his quality of life is doing quite nicely.  I will plan to see him back in 6 weeks.  I spent about 30 minutes with him today.  I reviewed his PET scan.    [name], MD 12/7/20182:28 PM

## 2017-11-14 ENCOUNTER — Ambulatory Visit: Payer: BLUE CROSS/BLUE SHIELD | Admitting: Radiation Oncology

## 2017-11-15 LAB — LACTATE DEHYDROGENASE: LDH: 219 U/L (ref 125–245)

## 2017-11-16 ENCOUNTER — Ambulatory Visit: Payer: BLUE CROSS/BLUE SHIELD | Admitting: Radiation Oncology

## 2017-11-18 ENCOUNTER — Ambulatory Visit
Admission: RE | Admit: 2017-11-18 | Discharge: 2017-11-18 | Disposition: A | Discharge status: Home / Self Care | Payer: BLUE CROSS/BLUE SHIELD | Source: Ambulatory Visit | Attending: Radiation Oncology | Admitting: Radiation Oncology

## 2017-11-18 VITALS — BP 125/86 | HR 66 | Temp 98.4°F | Resp 16

## 2017-11-18 DIAGNOSIS — C7931 Secondary malignant neoplasm of brain: Secondary | ICD-10-CM

## 2017-11-18 DIAGNOSIS — Z510 Encounter for antineoplastic radiation therapy: Secondary | ICD-10-CM | POA: Diagnosis not present

## 2017-11-18 NOTE — Progress Notes (Signed)
Nurse monitoring complete following srs treatment. Patient alert and oriented x 3. No distress noted. Denies headache, dizziness, nausea, diplopia or ringing in the ears. Patient denies taking decadron. Instructed patient to avoid strenuous activity for the next 24 hours and call the radiation oncologist on call at [phone] with needs. Patient verbalized understanding of all reviewed.  Patient discharged ambulatory in no distress.

## 2017-11-18 NOTE — Op Note (Signed)
  Name: Alexander Fry  MRN: [id number]  Date: 11/18/2017   DOB: 1961/09/11  Stereotactic Radiosurgery Operative Note  PRE-OPERATIVE DIAGNOSIS:  Multiple Brain Metastases  POST-OPERATIVE DIAGNOSIS:  Multiple Brain Metastases  PROCEDURE:  Stereotactic Radiosurgery  SURGEON:  Peggyann Shoals, MD  NARRATIVE: The patient underwent a radiation treatment planning session in the radiation oncology simulation suite under the care of the radiation oncology physician and physicist.  I participated closely in the radiation treatment planning afterwards. The patient underwent planning CT which was fused to 3T high resolution MRI with 1 mm axial slices.  These images were fused on the planning system.  We contoured the gross target volumes and subsequently expanded this to yield the Planning Target Volume. I actively participated in the planning process.  I helped to define and review the target contours and also the contours of the optic pathway, eyes, brainstem and selected nearby organs at risk.  All the dose constraints for critical structures were reviewed and compared to AAPM Task Group 101.  The prescription dose conformity was reviewed.  I approved the plan electronically.    Accordingly, [name] was brought to the TrueBeam stereotactic radiation treatment linac and placed in the custom immobilization mask.  The patient was aligned according to the IR fiducial markers with BrainLab Exactrac, then orthogonal x-rays were used in ExacTrac with the 6DOF robotic table and the shifts were made to align the patient  [name] received stereotactic radiosurgery uneventfully.    Lesions treated:  4   Complex lesions treated:  0 (>3.5 cm, <44mm of optic path, or within the brainstem)   The detailed description of the procedure is recorded in the radiation oncology procedure note.  I was present for the duration of the procedure.  DISPOSITION:  Following delivery, the patient was transported to nursing in stable  condition and monitored for possible acute effects to be discharged to home in stable condition with follow-up in one month.  Peggyann Shoals, MD 11/18/2017 5:53 PM

## 2017-11-22 NOTE — Progress Notes (Signed)
  Radiation Oncology         (336) [phone] ________________________________  Stereotactic Treatment Procedure Note  Name: Konnor Vondrasek MRN: [id number]  Date: 11/18/2017  DOB: [birth year]  SPECIAL TREATMENT PROCEDURE  Brain metastases ([place])    ICD-10-CM   1. Brain metastases ([place]) C79.31     3D TREATMENT PLANNING AND DOSIMETRY:  The patient's radiation plan was reviewed and approved by neurosurgery and radiation oncology prior to treatment.  It showed 3-dimensional radiation distributions overlaid onto the planning CT/MRI image set.  The [facility] for the target structures as well as the organs at risk were reviewed. The documentation of the 3D plan and dosimetry are filed in the radiation oncology EMR.  NARRATIVE:  Vaiden Adames was brought to the TrueBeam stereotactic radiation treatment machine and placed supine on the CT couch. The head frame was applied, and the patient was set up for stereotactic radiosurgery.  Neurosurgery was present for the set-up and delivery  SIMULATION VERIFICATION:  In the couch zero-angle position, the patient underwent Exactrac imaging using the Brainlab system with orthogonal KV images.  These were carefully aligned and repeated to confirm treatment position for each of the isocenters.  The Exactrac snap film verification was repeated at each couch angle.  SPECIAL TREATMENT PROCEDURE: [name] received stereotactic radiosurgery to the following targets: 4 targets (PTVs 22-25) were treated using 6 Rapid Arc VMAT Beams to a prescription dose of 20 Gy.  ExacTrac Snap verification was performed for each couch angle.  This constitutes a special treatment procedure due to the ablative dose delivered and the technical nature of treatment.  This highly technical modality of treatment ensures that the ablative dose is centered on the patient's tumor while sparing normal tissues from excessive dose and risk of detrimental effects.  STEREOTACTIC TREATMENT MANAGEMENT:  Following  delivery, the patient was transported to nursing in stable condition and monitored for possible acute effects.  Vital signs were recorded BP 125/86 (BP Location: Left Arm, Patient Position: Sitting, Cuff Size: Large)   Pulse 66   Temp 98.4 F (36.9 C) (Oral)   Resp 16   SpO2 100% . The patient tolerated treatment without significant acute effects, and was discharged to home in stable condition.    PLAN: Follow-up in one month.  ________________________________   [name], MD

## 2017-11-23 ENCOUNTER — Encounter: Payer: Self-pay | Admitting: Radiation Oncology

## 2017-11-23 NOTE — Progress Notes (Signed)
  Radiation Oncology         (336) [phone] ________________________________  Name: Alexander Fry MRN: [id number]  Date: 11/23/2017  DOB: 1961-08-16  End of Treatment Note  Diagnosis:   Brain metastases    Indication for treatment:  Palliative      Radiation treatment dates:  11/18/2017  Site/dose:    Brain/ 20Gy in 1 fraction  4 targets (PTVs 22-25) were treated using 6 Rapid Arc VMAT Beams to a prescription dose of 20 Gy.    Beams/energy:  VMAT SRS / 6FFF   Narrative: The patient tolerated radiation treatment relatively well.     Plan: The patient has completed radiation treatment. The patient will return to radiation oncology clinic for routine followup in one month. I advised them to call or return sooner if they have any questions or concerns related to their recovery or treatment.  -----------------------------------  [name], MD  This document serves as a record of services personally performed by [name], MD. It was created on her behalf by [name], a trained medical scribe. The creation of this record is based on the scribe's personal observations and the provider's statements to them. This document has been checked and approved by the attending provider.

## 2017-12-21 ENCOUNTER — Other Ambulatory Visit: Payer: Self-pay

## 2017-12-21 ENCOUNTER — Inpatient Hospital Stay: Payer: BLUE CROSS/BLUE SHIELD

## 2017-12-21 ENCOUNTER — Inpatient Hospital Stay: Payer: BLUE CROSS/BLUE SHIELD | Attending: Family | Admitting: Family

## 2017-12-21 ENCOUNTER — Encounter: Payer: Self-pay | Admitting: Family

## 2017-12-21 DIAGNOSIS — C787 Secondary malignant neoplasm of liver and intrahepatic bile duct: Secondary | ICD-10-CM | POA: Diagnosis not present

## 2017-12-21 DIAGNOSIS — C7931 Secondary malignant neoplasm of brain: Secondary | ICD-10-CM | POA: Diagnosis not present

## 2017-12-21 DIAGNOSIS — C7800 Secondary malignant neoplasm of unspecified lung: Secondary | ICD-10-CM | POA: Insufficient documentation

## 2017-12-21 DIAGNOSIS — C649 Malignant neoplasm of unspecified kidney, except renal pelvis: Secondary | ICD-10-CM | POA: Insufficient documentation

## 2017-12-21 DIAGNOSIS — C7951 Secondary malignant neoplasm of bone: Secondary | ICD-10-CM | POA: Insufficient documentation

## 2017-12-21 DIAGNOSIS — C641 Malignant neoplasm of right kidney, except renal pelvis: Secondary | ICD-10-CM

## 2017-12-21 LAB — CBC WITH DIFFERENTIAL (CANCER CENTER ONLY)
BASOS ABS: 0 10*3/uL (ref 0.0–0.1)
BASOS PCT: 0 %
Eosinophils Absolute: 0.2 10*3/uL (ref 0.0–0.5)
Eosinophils Relative: 3 %
HEMATOCRIT: 41.4 % (ref 38.7–49.9)
HEMOGLOBIN: 14.4 g/dL (ref 13.0–17.1)
LYMPHS PCT: 36 %
Lymphs Abs: 2.2 10*3/uL (ref 0.9–3.3)
MCH: 32.7 pg (ref 28.0–33.4)
MCHC: 34.8 g/dL (ref 32.0–35.9)
MCV: 93.9 fL (ref 82.0–98.0)
MONO ABS: 0.6 10*3/uL (ref 0.1–0.9)
Monocytes Relative: 9 %
NEUTROS ABS: 3.2 10*3/uL (ref 1.5–6.5)
NEUTROS PCT: 52 %
Platelet Count: 225 10*3/uL (ref 140–400)
RBC: 4.41 MIL/uL (ref 4.20–5.70)
RDW: 13.3 % (ref 11.1–15.7)
WBC: 6.2 10*3/uL (ref 4.0–10.3)

## 2017-12-21 LAB — CMP (CANCER CENTER ONLY)
ALBUMIN: 3.4 g/dL — AB (ref 3.5–5.0)
ALT: 23 U/L (ref 0–55)
AST: 26 U/L (ref 5–34)
Alkaline Phosphatase: 153 U/L — ABNORMAL HIGH (ref 40–150)
Anion gap: 7 (ref 3–11)
BILIRUBIN TOTAL: 0.6 mg/dL (ref 0.2–1.2)
BUN: 10 mg/dL (ref 7–26)
CO2: 28 mmol/L (ref 22–29)
CREATININE: 0.76 mg/dL (ref 0.70–1.30)
Calcium: 9 mg/dL (ref 8.4–10.4)
Chloride: 107 mmol/L (ref 98–109)
GFR, Est AFR Am: >60 mL/min (ref >60)
GFR, Estimated: >60 mL/min (ref >60)
GLUCOSE: 74 mg/dL (ref 70–140)
POTASSIUM: 3.9 mmol/L (ref 3.5–5.1)
Sodium: 142 mmol/L (ref 136–145)
TOTAL PROTEIN: 6.7 g/dL (ref 6.4–8.3)

## 2017-12-21 LAB — LACTATE DEHYDROGENASE: LDH: 221 U/L (ref 125–245)

## 2017-12-21 NOTE — Progress Notes (Signed)
Hematology and Oncology Follow Up Visit  Alexander Fry [id number] [birth year] 57 y.o. 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 Unfortunately his father passed away in early N12-02-24and there has been a lot of "family drama with his sister". He has been stressed helping his mother sell her home and moving [place] into an assisted living facility [place] He has had to do quite a bit of travelling back and forth.  He did well with SRS for new brain mets in December. He follows up with Dr. SIsidore Mooson Friday (1/18).   He verbalized that he is taking his Cabometyx as prescribed.  He has a part of a lower left molar coming through the gum and it is sharp. He is being followed by an oral surgeon and plans to call for an appointment.  No fever, chills, n/v, cough, rash, dizziness, SOB, chest pain, palpitations, abdominal pain or changes in bowel or bladder habits.  No swelling, tenderness, numbness or tingling in his extremities. No c/o pain at this time.  He has a good appetite. He has been "gassy" and has started eating probiotic yogurt which he feels has helped.  He states that he needs to hydrate better. His weight is down 4 lbs since his last visit.   ECOG Performance Status: 1 - Symptomatic but completely ambulatory  Medications:  Allergies as of 12/21/2017   No Known Allergies     Medication List        Accurate as of 12/21/17  1:02 PM. Always use your most recent med list.          CABOMETYX 40 MG Tabs Generic drug:  cabozantinib S-Malate TAKE 40MG  TABLET BY MOUTH EVERY DAY   cetirizine 10 MG tablet Commonly known as:  ZYRTEC Take 10 mg by mouth daily. He is taking it 3 times daily   tamsulosin 0.4 MG Caps capsule Commonly known as:  FLOMAX Take 1 capsule (0.4 mg total) by mouth daily.       Allergies: No Known Allergies  Past Medical History, Surgical history, Social history, and Family History were reviewed and updated.  Review of Systems: All other 10 point review of systems is negative.   Physical Exam:  weight is 228 lb 1.9 oz (103.5 kg). His oral temperature is 98.8 F (37.1 C). His blood pressure is 134/70 and his pulse is 71. His respiration is 17 and oxygen saturation is 100%.   Wt Readings from Last 3 Encounters:  12/21/17 228 lb 1.9 oz (103.5 kg)  11/11/17 232 lb (105.2 kg)  11/04/17 232 lb 6.4 oz (105.4 kg)    Ocular: Sclerae unicteric, pupils equal, round and reactive to light Ear-nose-throat: Oropharynx clear, dentition fair Lymphatic: No cervical, supraclavicular or axillary adenopathy Lungs no rales or rhonchi, good excursion bilaterally Heart regular rate and rhythm, no murmur appreciated Abd soft, nontender, positive bowel sounds, no liver or spleen tip palpated on exam, no fluid wave  MSK no focal spinal tenderness, no joint edema Neuro: non-focal, well-oriented, appropriate affect Breasts: Deferred   Lab  Results  Component Value Date   WBC 6.2 11/11/2017   HGB 14.4 11/11/2017   HCT 41.4 12/21/2017   MCV 93.9 12/21/2017   PLT 201 11/11/2017   No results found for: FERRITIN, IRON, TIBC, UIBC, IRONPCTSAT Lab Results  Component Value Date   RBC 4.41 12/21/2017   No results found for: KPAFRELGTCHN, LAMBDASER, KAPLAMBRATIO No results found for: IGGSERUM, IGA, IGMSERUM No results found for: Kathrynn Ducking, MSPIKE, SPEI   Chemistry      Component Value Date/Time   NA 147 (H) 11/11/2017 1337   NA 141 10/13/2017 1118   K 3.9 11/11/2017 1337   K  3.6 10/13/2017 1118   CL 107 11/11/2017 1337   CO2 28 11/11/2017 1337   CO2 25 10/13/2017 1118   BUN 12 11/11/2017 1337   BUN 9.1 10/13/2017 1118   CREATININE 0.9 11/11/2017 1337   CREATININE 0.7 10/13/2017 1118      Component Value Date/Time   CALCIUM 9.2 11/11/2017 1337   CALCIUM 9.2 10/13/2017 1118   ALKPHOS 166 (H) 11/11/2017 1337   ALKPHOS 181 (H) 10/13/2017 1118   AST 32 11/11/2017 1337   AST 38 (H) 10/13/2017 1118   ALT 38 11/11/2017 1337   ALT 34 10/13/2017 1118   BILITOT 0.80 11/11/2017 1337   BILITOT 1.02 10/13/2017 1118      Impression and Plan: Alexander Fry is a pleasant 57 yo caucasian gentleman with metastatic renal cell carcinoma. He tolerated SRS for new brain mets last month well. He has had a lot of family stress lately which has been hard on him.   He follows up with Dr. Isidore Moos later this week on 1/18.  He will continue on his same regimen with Cabometyx. Alk/phos continues slowly improve.  We will plan to repeat a PET scan in February and follow-up in 6 weeks with lab.  He will contact our office with any questions or concerns. We can certainly see him sooner if need be.   Laverna Peace, NP 1/16/20191:02 PM

## 2017-12-22 ENCOUNTER — Telehealth: Payer: Self-pay | Admitting: *Deleted

## 2017-12-22 NOTE — Telephone Encounter (Addendum)
Patient is aware of results  ----- Message from [name], MD sent at 12/21/2017  5:48 PM EST ----- Call - liver tests keep getting better!!!  pete

## 2017-12-23 ENCOUNTER — Encounter: Payer: Self-pay | Admitting: Radiation Oncology

## 2017-12-23 ENCOUNTER — Ambulatory Visit
Admission: RE | Admit: 2017-12-23 | Discharge: 2017-12-23 | Disposition: A | Discharge status: Home / Self Care | Payer: BLUE CROSS/BLUE SHIELD | Source: Ambulatory Visit | Attending: Radiation Oncology | Admitting: Radiation Oncology

## 2017-12-23 DIAGNOSIS — Z923 Personal history of irradiation: Secondary | ICD-10-CM | POA: Diagnosis not present

## 2017-12-23 DIAGNOSIS — C801 Malignant (primary) neoplasm, unspecified: Secondary | ICD-10-CM | POA: Insufficient documentation

## 2017-12-23 DIAGNOSIS — C7931 Secondary malignant neoplasm of brain: Secondary | ICD-10-CM

## 2017-12-23 DIAGNOSIS — G8929 Other chronic pain: Secondary | ICD-10-CM | POA: Diagnosis not present

## 2017-12-23 NOTE — Progress Notes (Signed)
[name] presents for follow up of Alexander Fry radiation completed 11/18/17 to his brain. He denies pain. He does have occasional fatigue, but has been busy after Christmas. He continues to take Carbometyx daily without difficulty.  He has occasional throbbing to his right temple since completing radiation after concentrating and extra physical work. He improves after laying down for awhile. He tells me he is eating well, but is losing weight. He weighed himself this am at home without clothes and was 218.0 lbs.  He denies any other concerns.   BP 118/69   Pulse 70   Temp 98.6 F (37 C)   Ht 6' (1.829 m)   Wt 233 lb (105.7 kg)   SpO2 97% Comment: room air  BMI 31.60 kg/m    Wt Readings from Last 3 Encounters:  12/23/17 233 lb (105.7 kg)  12/21/17 228 lb 1.9 oz (103.5 kg)  11/11/17 232 lb (105.2 kg)

## 2017-12-23 NOTE — Progress Notes (Signed)
  Radiation Oncology         (336) [phone] ________________________________  Name: Alexander Fry MRN: [id number]  Date: 12/23/2017  DOB: 09-21-1961  Follow-Up Visit Note  Outpatient  CC: [name], PA-C  [name], MD  Diagnosis:   57 y.o. gentleman with brain metastases     ICD-10-CM   1. Brain metastases ([place]) C79.31     CHIEF COMPLAINT: Here for follow-up and surveillance of brain cancer.  Interval Since Last Radiation:  4 weeks  11/18/17; 20 Gy delivered to the brain in one fraction using Photon // 6FFF.  Narrative:  The patient returns today for routine follow-up, completed radiation on 11/18/17.        PET scan scheduled with Medical oncology in 3 weeks.   Symptomatically, denies  seizures, headaches, nausea, new neurologic deficits, visual changes. Patient complains of chronic  back pain when sitting for long periods of time.  This is helped by exercise       ALLERGIES:  has No Known Allergies.  Meds: Current Outpatient Medications  Medication Sig Dispense Refill  . CABOMETYX 40 MG TABS TAKE 40MG  TABLET BY MOUTH EVERY DAY 30 tablet PRN  . cetirizine (ZYRTEC) 10 MG tablet Take 10 mg by mouth daily. He is taking it 3 times daily     No current facility-administered medications for this encounter.     Physical Findings: The patient is in no acute distress. Patient is alert and oriented.  height is 6' (1.829 m) and weight is 233 lb (105.7 kg). His temperature is 98.6 F (37 C). His blood pressure is 118/69 and his pulse is 70. His oxygen saturation is 97%. .    General: Alert and oriented, in no acute distress HEENT: Head is normocephalic. Extraocular movements are intact. Oropharynx is clear. Neck: Neck is supple, no palpable cervical or supraclavicular lymphadenopathy. Heart: Regular in rate and rhythm with no murmurs, rubs, or gallops. Chest: Lung sounds are slightly course in the left lower lobe. Musculoskeletal: symmetric strength and  muscle tone throughout.     Lab Findings: Lab Results  Component Value Date   WBC 6.2 12/21/2017   HGB 14.4 11/11/2017   HCT 41.4 12/21/2017   MCV 93.9 12/21/2017   PLT 225 12/21/2017    @LASTCHEMISTRY @  Radiographic Findings: No results found.  Impression/Plan:  57 y.o. gentleman with brain metastases. Patient is doing well overall.  Recovering from radiosurgery.  Patient will see Dr. Vertell Limber in 2 months.  We will arrange a brain MRI to precede that appointment  _____________________________________   [name], MD   This document serves as a record of services personally performed by [name] MD. It was created on her behalf by Delton Coombes, a trained medical scribe. The creation of this record is based on the scribe's personal observations and the provider's statements to them.

## 2017-12-30 ENCOUNTER — Other Ambulatory Visit: Payer: Self-pay | Admitting: *Deleted

## 2017-12-30 DIAGNOSIS — C7931 Secondary malignant neoplasm of brain: Secondary | ICD-10-CM

## 2017-12-30 DIAGNOSIS — C7949 Secondary malignant neoplasm of other parts of nervous system: Principal | ICD-10-CM

## 2018-01-02 ENCOUNTER — Other Ambulatory Visit: Payer: Self-pay | Admitting: *Deleted

## 2018-01-02 DIAGNOSIS — C641 Malignant neoplasm of right kidney, except renal pelvis: Secondary | ICD-10-CM

## 2018-01-02 MED ORDER — CABOZANTINIB S-MALATE 40 MG PO TABS: 40.0000 mg | ORAL_TABLET | Freq: Every day | ORAL | 6 refills | Status: DC

## 2018-01-16 ENCOUNTER — Encounter (HOSPITAL_COMMUNITY)
Admission: RE | Admit: 2018-01-16 | Discharge: 2018-01-16 | Disposition: A | Discharge status: Home / Self Care | Payer: BLUE CROSS/BLUE SHIELD | Source: Ambulatory Visit | Attending: Family | Admitting: Family

## 2018-01-16 DIAGNOSIS — C641 Malignant neoplasm of right kidney, except renal pelvis: Secondary | ICD-10-CM | POA: Insufficient documentation

## 2018-01-16 LAB — GLUCOSE, CAPILLARY: Glucose-Capillary: 100 mg/dL — ABNORMAL HIGH (ref 65–99)

## 2018-01-16 MED ORDER — FLUDEOXYGLUCOSE F - 18 (FDG) INJECTION
11.3000 | Freq: Once | INTRAVENOUS | Status: AC | PRN
  Administered 2018-01-16: 11.3 via INTRAVENOUS

## 2018-01-18 ENCOUNTER — Ambulatory Visit (HOSPITAL_COMMUNITY): Payer: BLUE CROSS/BLUE SHIELD

## 2018-01-19 ENCOUNTER — Encounter: Payer: Self-pay | Admitting: Hematology & Oncology

## 2018-01-24 ENCOUNTER — Encounter: Payer: Self-pay | Admitting: Hematology & Oncology

## 2018-01-24 ENCOUNTER — Inpatient Hospital Stay: Payer: BLUE CROSS/BLUE SHIELD

## 2018-01-24 ENCOUNTER — Other Ambulatory Visit: Payer: Self-pay

## 2018-01-24 ENCOUNTER — Inpatient Hospital Stay: Payer: BLUE CROSS/BLUE SHIELD | Attending: Family | Admitting: Hematology & Oncology

## 2018-01-24 VITALS — BP 146/74 | HR 86 | Temp 99.5°F | Resp 19 | Wt 237.0 lb

## 2018-01-24 DIAGNOSIS — C641 Malignant neoplasm of right kidney, except renal pelvis: Secondary | ICD-10-CM

## 2018-01-24 DIAGNOSIS — C649 Malignant neoplasm of unspecified kidney, except renal pelvis: Secondary | ICD-10-CM | POA: Diagnosis not present

## 2018-01-24 DIAGNOSIS — C7951 Secondary malignant neoplasm of bone: Secondary | ICD-10-CM | POA: Diagnosis not present

## 2018-01-24 DIAGNOSIS — D641 Secondary sideroblastic anemia due to disease: Secondary | ICD-10-CM | POA: Diagnosis not present

## 2018-01-24 DIAGNOSIS — C787 Secondary malignant neoplasm of liver and intrahepatic bile duct: Secondary | ICD-10-CM | POA: Diagnosis not present

## 2018-01-24 LAB — CBC WITH DIFFERENTIAL (CANCER CENTER ONLY)
Basophils Absolute: 0 10*3/uL (ref 0.0–0.1)
Basophils Relative: 0 %
EOS ABS: 0.2 10*3/uL (ref 0.0–0.5)
Eosinophils Relative: 2 %
HEMATOCRIT: 41.2 % (ref 38.7–49.9)
HEMOGLOBIN: 13.8 g/dL (ref 13.0–17.1)
LYMPHS ABS: 2.3 10*3/uL (ref 0.9–3.3)
Lymphocytes Relative: 32 %
MCH: 31.7 pg (ref 28.0–33.4)
MCHC: 33.5 g/dL (ref 32.0–35.9)
MCV: 94.5 fL (ref 82.0–98.0)
MONO ABS: 0.5 10*3/uL (ref 0.1–0.9)
Monocytes Relative: 8 %
NEUTROS PCT: 58 %
Neutro Abs: 4.1 10*3/uL (ref 1.5–6.5)
Platelet Count: 272 10*3/uL (ref 145–400)
RBC: 4.36 MIL/uL (ref 4.20–5.70)
RDW: 13.3 % (ref 11.1–15.7)
WBC Count: 7.1 10*3/uL (ref 4.0–10.0)

## 2018-01-24 LAB — LACTATE DEHYDROGENASE: LDH: 177 U/L (ref 98–192)

## 2018-01-24 LAB — CMP (CANCER CENTER ONLY)
ALK PHOS: 181 U/L — AB (ref 26–84)
ALT: 21 U/L (ref 10–47)
AST: 25 U/L (ref 11–38)
Albumin: 3 g/dL — ABNORMAL LOW (ref 3.5–5.0)
Anion gap: 6 (ref 5–15)
BILIRUBIN TOTAL: 0.7 mg/dL (ref 0.2–1.6)
BUN: 6 mg/dL — ABNORMAL LOW (ref 7–22)
CHLORIDE: 105 mmol/L (ref 98–108)
CO2: 32 mmol/L (ref 18–33)
Calcium: 8.5 mg/dL (ref 8.0–10.3)
Creatinine: 0.6 mg/dL (ref 0.60–1.20)
Glucose, Bld: 151 mg/dL — ABNORMAL HIGH (ref 73–118)
Potassium: 3.3 mmol/L (ref 3.3–4.7)
SODIUM: 143 mmol/L (ref 128–145)
Total Protein: 6.7 g/dL (ref 6.4–8.1)

## 2018-01-24 NOTE — Progress Notes (Signed)
START ON PATHWAY REGIMEN - Renal Cell     A cycle is every 28 days:     Nivolumab   **Always confirm dose/schedule in your pharmacy ordering system**    Patient Characteristics: Metastatic, Clear Cell, Second Line, Prior Anti-Angiogenic Treatment AJCC M Category: M1 AJCC 8 Stage Grouping: IV Current evidence of distant metastases<= Yes AJCC T Category: T3 AJCC N Category: N1 Does patient have oligometastatic disease<= No Would you be surprised if this patient died  in the next year<= I would be surprised if this patient died in the next year Histology: Clear Cell Line of therapy: Second Line Intent of Therapy: Non-Curative / Palliative Intent, Discussed with Patient

## 2018-01-24 NOTE — Progress Notes (Signed)
Hematology and Oncology Follow Up Visit  Karron Alvizo [id number] [birth year] 57 y.o. 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 Fouche is here today for follow-up.  Everything had been going well for him.  He is working.  He has been traveling.  Unfortunately, we did a routine PET scan on him.  This is for routine staging.  Much to my surprise, the PET scan showed progressive disease.  He has progression mostly within the chest.  He has new lymphadenopathy in the neck, chest and abdomen.  He has some new lymph nodes in the chest.  He has some adenopathy in the retroperitoneum.  He has bone lesions.  I think it is clear that he is progressing.  He had been doing well on the Cabometyx.  We are going to have to make a change in therapy.  He still has a very good performance status.  His performance status is ECOG 1.  He sees the radiation oncologist on March 1.  He has both have an MRI.  It, would not surprise me, if there were new brain metastasis.  He has had some pain in his right hip.  [place] have some sinus issues.  I will call in some Augmentin for him.  He has had no problems with bowels or bladder.   Medications:  Allergies as of 01/24/2018   No Known Allergies     Medication List        Accurate as of 01/24/18  5:35 PM. Always use your most recent med list.          cetirizine 10 MG tablet Commonly known as:  ZYRTEC Take 10 mg by mouth daily. He is taking it 3 times daily       Allergies: No Known  Allergies  Past Medical History, Surgical history, Social history, and Family History were reviewed and updated.  Review of Systems: Review of Systems  Constitutional: Negative.   HENT: Negative.   Eyes: Negative.   Respiratory: Negative.   Cardiovascular: Negative.   Gastrointestinal: Negative.   Genitourinary: Negative.   Musculoskeletal: Negative.   Skin: Negative.   Neurological: Negative.   Endo/Heme/Allergies: Negative.   Psychiatric/Behavioral: Negative.      Physical Exam:  weight is 237 lb (107.5 kg). His oral temperature is 99.5 F (37.5 C). His blood pressure is 146/74 (abnormal) and his pulse is 86. His respiration is 19 and oxygen saturation is 98%.   Wt Readings from Last 3 Encounters:  01/24/18 237 lb (107.5 kg)  12/23/17 233 lb (105.7 kg)  12/21/17 228 lb 1.9 oz (103.5 kg)    Physical Exam  Constitutional: He is oriented to person, place, and time.  HENT:  Head: Normocephalic and atraumatic.  Mouth/Throat: Oropharynx is clear and moist.  Eyes: EOM are normal. Pupils are equal, round, and reactive to light.  Neck: Normal range of motion.  Cardiovascular: Normal rate, regular rhythm and normal heart sounds.  Pulmonary/Chest: Effort normal and breath sounds normal.  Abdominal: Soft. Bowel sounds are normal.  Musculoskeletal: Normal  range of motion. He exhibits no edema, tenderness or deformity.  Lymphadenopathy:    He has no cervical adenopathy.  Neurological: He is alert and oriented to person, place, and time.  Skin: Skin is warm and dry. No rash noted. No erythema.  Psychiatric: He has a normal mood and affect. His behavior is normal. Judgment and thought content normal.  Vitals reviewed.    Lab Results  Component Value Date   WBC 7.1 01/24/2018   HGB 14.4 11/11/2017   HCT 41.2 01/24/2018   MCV 94.5 01/24/2018   PLT 272 01/24/2018   No results found for: FERRITIN, IRON, TIBC, UIBC, IRONPCTSAT Lab Results  Component Value Date   RBC 4.36  01/24/2018   No results found for: KPAFRELGTCHN, LAMBDASER, KAPLAMBRATIO No results found for: IGGSERUM, IGA, IGMSERUM No results found for: Kathrynn Ducking, MSPIKE, SPEI   Chemistry      Component Value Date/Time   NA 143 01/24/2018 1403   NA 147 (H) 11/11/2017 1337   NA 141 10/13/2017 1118   K 3.3 01/24/2018 1403   K 3.9 11/11/2017 1337   K 3.6 10/13/2017 1118   CL 105 01/24/2018 1403   CL 107 11/11/2017 1337   CO2 32 01/24/2018 1403   CO2 28 11/11/2017 1337   CO2 25 10/13/2017 1118   BUN 6 (L) 01/24/2018 1403   BUN 12 11/11/2017 1337   BUN 9.1 10/13/2017 1118   CREATININE 0.60 01/24/2018 1403   CREATININE 0.9 11/11/2017 1337   CREATININE 0.7 10/13/2017 1118      Component Value Date/Time   CALCIUM 8.5 01/24/2018 1403   CALCIUM 9.2 11/11/2017 1337   CALCIUM 9.2 10/13/2017 1118   ALKPHOS 181 (H) 01/24/2018 1403   ALKPHOS 166 (H) 11/11/2017 1337   ALKPHOS 181 (H) 10/13/2017 1118   AST 25 01/24/2018 1403   AST 38 (H) 10/13/2017 1118   ALT 21 01/24/2018 1403   ALT 38 11/11/2017 1337   ALT 34 10/13/2017 1118   BILITOT 0.7 01/24/2018 1403   BILITOT 1.02 10/13/2017 1118      Impression and Plan: Mr. Reep is a very pleasant 57 yo caucasian gentleman with metastatic renal cell carcinoma.  Again, we are going to have to make a change in his protocol.  I talked him about immunotherapy.  By the NCCN guidelines, immunotherapy is recommended as a second line therapy.  Because he is in good health, I would favor using Nivolumab with ipilimumab.  I think this would be a very useful combination for him.  I think he could handle this.  He will need to have a Port-A-Cath placed.  I spent about 45 minutes with him.  I went over the side effects of treatment.  I gave him information sheets on each medication.  I did tell him that if treatment wart, it might take a good 6-8 weeks before we would know.  The good thing with immunotherapy is  that if we do see a response, often times, that response is long lasting.  Over 50% of the time was spent face-to-face with Mr. Petties.  Hopefully, the insurance company will allow [name] to use this treatment for him.  I will try to get started next week.    [name], MD 2/19/20195:35 PM

## 2018-01-25 ENCOUNTER — Encounter: Payer: Self-pay | Admitting: Hematology & Oncology

## 2018-01-25 MED ORDER — AMOXICILLIN-POT CLAVULANATE 875-125 MG PO TABS: 1.0000 | ORAL_TABLET | Freq: Two times a day (BID) | ORAL | 0 refills | Status: DC

## 2018-01-25 NOTE — Addendum Note (Signed)
Addended by: Burney Gauze R on: 01/25/2018 08:50 AM   Modules accepted: Orders

## 2018-02-01 ENCOUNTER — Inpatient Hospital Stay: Payer: BLUE CROSS/BLUE SHIELD

## 2018-02-01 ENCOUNTER — Encounter: Payer: Self-pay | Admitting: *Deleted

## 2018-02-01 ENCOUNTER — Other Ambulatory Visit: Payer: BLUE CROSS/BLUE SHIELD

## 2018-02-01 ENCOUNTER — Ambulatory Visit: Payer: BLUE CROSS/BLUE SHIELD | Admitting: Family

## 2018-02-03 ENCOUNTER — Other Ambulatory Visit: Payer: Self-pay | Admitting: Physician Assistant

## 2018-02-03 ENCOUNTER — Ambulatory Visit
Admission: RE | Admit: 2018-02-03 | Discharge: 2018-02-03 | Disposition: A | Discharge status: Home / Self Care | Payer: BLUE CROSS/BLUE SHIELD | Source: Ambulatory Visit | Attending: Radiation Oncology | Admitting: Radiation Oncology

## 2018-02-03 ENCOUNTER — Other Ambulatory Visit: Payer: Self-pay | Admitting: *Deleted

## 2018-02-03 DIAGNOSIS — C641 Malignant neoplasm of right kidney, except renal pelvis: Secondary | ICD-10-CM

## 2018-02-03 DIAGNOSIS — C7931 Secondary malignant neoplasm of brain: Secondary | ICD-10-CM

## 2018-02-03 DIAGNOSIS — C7949 Secondary malignant neoplasm of other parts of nervous system: Principal | ICD-10-CM

## 2018-02-03 MED ORDER — LIDOCAINE-PRILOCAINE 2.5-2.5 % EX CREA: TOPICAL_CREAM | CUTANEOUS | 3 refills | Status: DC

## 2018-02-03 MED ORDER — GADOBENATE DIMEGLUMINE 529 MG/ML IV SOLN
20.0000 mL | Freq: Once | INTRAVENOUS | Status: AC | PRN
  Administered 2018-02-03: 20 mL via INTRAVENOUS

## 2018-02-06 ENCOUNTER — Ambulatory Visit (HOSPITAL_COMMUNITY)
Admission: RE | Admit: 2018-02-06 | Discharge: 2018-02-06 | Disposition: A | Discharge status: Home / Self Care | Payer: BLUE CROSS/BLUE SHIELD | Source: Ambulatory Visit | Attending: Hematology & Oncology | Admitting: Hematology & Oncology

## 2018-02-06 ENCOUNTER — Other Ambulatory Visit: Payer: Self-pay | Admitting: Hematology & Oncology

## 2018-02-06 ENCOUNTER — Encounter (HOSPITAL_COMMUNITY): Payer: Self-pay

## 2018-02-06 DIAGNOSIS — C641 Malignant neoplasm of right kidney, except renal pelvis: Secondary | ICD-10-CM

## 2018-02-06 DIAGNOSIS — C779 Secondary and unspecified malignant neoplasm of lymph node, unspecified: Secondary | ICD-10-CM | POA: Insufficient documentation

## 2018-02-06 DIAGNOSIS — C787 Secondary malignant neoplasm of liver and intrahepatic bile duct: Secondary | ICD-10-CM | POA: Insufficient documentation

## 2018-02-06 DIAGNOSIS — C7800 Secondary malignant neoplasm of unspecified lung: Secondary | ICD-10-CM | POA: Diagnosis not present

## 2018-02-06 DIAGNOSIS — C649 Malignant neoplasm of unspecified kidney, except renal pelvis: Secondary | ICD-10-CM | POA: Diagnosis present

## 2018-02-06 DIAGNOSIS — C7951 Secondary malignant neoplasm of bone: Secondary | ICD-10-CM | POA: Diagnosis not present

## 2018-02-06 HISTORY — PX: IR FLUORO GUIDE PORT INSERTION RIGHT: IMG5741

## 2018-02-06 HISTORY — PX: IR US GUIDE VASC ACCESS RIGHT: IMG2390

## 2018-02-06 LAB — CBC
HCT: 40.8 % (ref 39.0–52.0)
HEMOGLOBIN: 14 g/dL (ref 13.0–17.0)
MCH: 31.5 pg (ref 26.0–34.0)
MCHC: 34.3 g/dL (ref 30.0–36.0)
MCV: 91.9 fL (ref 78.0–100.0)
Platelets: 319 10*3/uL (ref 150–400)
RBC: 4.44 MIL/uL (ref 4.22–5.81)
RDW: 13.5 % (ref 11.5–15.5)
WBC: 9.4 10*3/uL (ref 4.0–10.5)

## 2018-02-06 LAB — APTT: APTT: 24 s (ref 24–36)

## 2018-02-06 LAB — PROTIME-INR
INR: 0.94
PROTHROMBIN TIME: 12.5 s (ref 11.4–15.2)

## 2018-02-06 MED ORDER — FENTANYL CITRATE (PF) 100 MCG/2ML IJ SOLN
INTRAMUSCULAR | Status: AC | PRN
  Administered 2018-02-06 (×2): 50 ug via INTRAVENOUS

## 2018-02-06 MED ORDER — MIDAZOLAM HCL 2 MG/2ML IJ SOLN
INTRAMUSCULAR | Status: AC
  Filled 2018-02-06: qty 4

## 2018-02-06 MED ORDER — SODIUM CHLORIDE 0.9 % IV SOLN
INTRAVENOUS | Status: DC
  Administered 2018-02-06: 08:00:00 via INTRAVENOUS

## 2018-02-06 MED ORDER — CEFAZOLIN SODIUM-DEXTROSE 2-4 GM/100ML-% IV SOLN
INTRAVENOUS | Status: AC
  Administered 2018-02-06: 2 g via INTRAVENOUS
  Filled 2018-02-06: qty 100

## 2018-02-06 MED ORDER — LIDOCAINE-EPINEPHRINE (PF) 1 %-1:200000 IJ SOLN
INTRAMUSCULAR | Status: AC | PRN
  Administered 2018-02-06: 20 mL

## 2018-02-06 MED ORDER — HEPARIN SOD (PORK) LOCK FLUSH 100 UNIT/ML IV SOLN
INTRAVENOUS | Status: AC
  Filled 2018-02-06: qty 5

## 2018-02-06 MED ORDER — LIDOCAINE-EPINEPHRINE (PF) 2 %-1:200000 IJ SOLN
INTRAMUSCULAR | Status: AC
  Filled 2018-02-06: qty 20

## 2018-02-06 MED ORDER — LIDOCAINE-EPINEPHRINE (PF) 1 %-1:200000 IJ SOLN
INTRAMUSCULAR | Status: AC | PRN
  Administered 2018-02-06: 5 mL

## 2018-02-06 MED ORDER — CEFAZOLIN SODIUM-DEXTROSE 2-4 GM/100ML-% IV SOLN
2.0000 g | Freq: Once | INTRAVENOUS | Status: AC
  Administered 2018-02-06: 2 g via INTRAVENOUS

## 2018-02-06 MED ORDER — HEPARIN SOD (PORK) LOCK FLUSH 100 UNIT/ML IV SOLN
INTRAVENOUS | Status: AC | PRN
  Administered 2018-02-06: 500 [IU] via INTRAVENOUS

## 2018-02-06 MED ORDER — MIDAZOLAM HCL 2 MG/2ML IJ SOLN
INTRAMUSCULAR | Status: AC | PRN
  Administered 2018-02-06 (×3): 1 mg via INTRAVENOUS

## 2018-02-06 MED ORDER — FENTANYL CITRATE (PF) 100 MCG/2ML IJ SOLN
INTRAMUSCULAR | Status: AC
  Filled 2018-02-06: qty 2

## 2018-02-06 NOTE — Progress Notes (Signed)
Chief Complaint: Patient was seen in consultation today for renal cell carcinoma  Referring Physician(s): Fry,Alexander R  Supervising Physician: Alexander Fry  Patient Status: [name] - Out-pt  History of Present Illness: Alexander Fry is a 57 y.o. 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  He now plans to initiate immunotherapy.   IR consulted for Port-A-Cath placement at the request of Dr. Marin Fry.  He presents today in his usual state of health.  He has been NPO.  He does not take blood thinners.   Past Medical History:  Diagnosis Date  . Goals of care, counseling/discussion 12/22/2016  . History of radiation therapy 02/11/2017   SRT right posterior frontal 12 mm target 20 Gy, Right anterior frontal 39mm 20 Gy  . History of radiation therapy 02/21/2017   SRT Right post parietal 48mm target 20 Gy, left Post parietal 40mm 20 Gy, Right Occipital 4 mm 20Gy, Left Occipital 4 mm 20 Gy  . History of radiation therapy 02/25/2017, 02/28/2017, 03/02/2017   SRT L1 spine 27 Gy 3 fractions, L4 Spine 27 Gy 3 fractions  . History of radiation therapy 06/07/2017   SRS- Brain  . Inguinal hernia of left side without obstruction or gangrene   . Pneumonia    left lung  . Renal cell carcinoma, right ([place]) 01/05/2017  . 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 His vision has "spots" at times    Past Surgical History:  Procedure Laterality Date  . HERNIA REPAIR    . INGUINAL HERNIA REPAIR Left     Allergies: Patient has no known allergies.  Medications: Prior to Admission medications   Medication Sig Start Date End Date Taking? Authorizing Provider  acetaminophen (TYLENOL) 325 MG tablet Take 650 mg by mouth every 6 (six) hours as needed.   Yes [provider]  guaiFENesin (MUCINEX) 600 MG 12 hr tablet Take by mouth 2 (two)  times daily.   Yes [provider]  amoxicillin-clavulanate (AUGMENTIN) 875-125 MG tablet Take 1 tablet by mouth 2 (two) times daily. 01/25/18   [name], MD  cetirizine (ZYRTEC) 10 MG tablet Take 10 mg by mouth daily. He is taking it 3 times daily    [provider]  lidocaine-prilocaine (EMLA) cream Apply to affected area once 02/03/18   [name], MD     Family History  Problem Relation Age of Onset  . Heart disease Mother   . Diabetes Mother   . Heart disease Father   . Alcohol abuse Brother     Social History   Socioeconomic History  . Marital status: Single    Spouse name: None  . Number of children: None  . Years of education: None  . Highest education level: None  Social Needs  . Financial resource strain: None  . Food insecurity - worry: None  . Food insecurity - inability: None  . Transportation needs - medical: None  . Transportation needs - non-medical: None  Occupational History  . None  Tobacco Use  . Smoking status: Never Smoker  . Smokeless tobacco: Never Used  Substance and Sexual Activity  . Alcohol use: Yes    Comment: social use in the past  . Drug use: No  . Sexual activity: Not Currently  Other Topics Concern  . None  Social History Narrative  . None  Review of Systems: A 12 point ROS discussed and pertinent positives are indicated in the HPI above.  All other systems are negative.  Review of Systems  Constitutional: Negative for fatigue and fever.  HENT: Positive for sinus pressure and sinus pain (chronic).   Respiratory: Negative for cough and shortness of breath.   Cardiovascular: Negative for chest pain.  Gastrointestinal: Negative for abdominal pain.  Psychiatric/Behavioral: Negative for behavioral problems and confusion.    Vital Signs: BP 135/69 (BP Location: Right Arm)   Pulse 80   Temp 98.9 F (37.2 C) (Oral)   Resp 18   SpO2 96%   Physical Exam  Constitutional: He is oriented to person,  place, and time. He appears well-developed.  Cardiovascular: Normal rate and normal heart sounds.  Regular, skipped beat  Pulmonary/Chest: Effort normal and breath sounds normal. No respiratory distress.  Neurological: He is alert and oriented to person, place, and time.  Skin: Skin is warm and dry.  Psychiatric: He has a normal mood and affect. His behavior is normal. Judgment and thought content normal.  Nursing note and vitals reviewed.    MD Evaluation Airway: WNL Heart: WNL Abdomen: WNL Chest/ Lungs: WNL ASA  Classification: 3 Mallampati/Airway Score: Two   Imaging: Mr Jeri Cos WU Contrast  Result Date: 02/03/2018 CLINICAL DATA:  57 year old male with metastatic renal cell carcinoma status radiation treatment for 4 New brain metastases in December 2018. In December 2018. EXAM: MRI HEAD WITHOUT AND WITH CONTRAST TECHNIQUE: Multiplanar, multiecho pulse sequences of the brain and surrounding structures were obtained without and with intravenous contrast. CONTRAST:  65mL MULTIHANCE GADOBENATE DIMEGLUMINE 529 MG/ML IV SOLN COMPARISON:  10/31/2017 brain MRI and earlier. FINDINGS: BRAIN New Lesions: Five mm enhancing lesion located in the medial right cerebellum near the vermis with no surrounding edema and no mass effect seen on series 19, image 37. Larger lesions: Treated enhancing lesion located along the right frontal operculum has slightly increased from roughly 7 mm to now 9-10 mm, with stable surrounding FLAIR hyperintensity (series 7, image 36) and no mass effect. See series 19, image 115. Cerebral perfusion imaging was performed, and evaluation of cerebral blood volume maps suggests no increased volume. Stable or Smaller lesions: Largely resolved, now minimally enhancing lesion located right lateral cerebellum and seen on series 19, image 43. Punctate, minimally enhancing lesion located right lower occipital lobe and seen on series 19, image 61. 2 millimeter mm enhancing lesion located  upper right occipital lobe and seen on series 19, image 87. Punctate, minimally enhancing lesion located left superior parietal lobe and seen on series 19, image 133. Largely resolved, minimally enhancing punctate lesion located right superior frontal gyrus and seen on series 19, image 150. Other Brain findings: Stable cerebral volume. No restricted diffusion to suggest acute infarction. No midline shift, mass effect, extra-axial collection or acute intracranial hemorrhage. Cervicomedullary junction and pituitary are within normal limits. Mild edema associated with some of the treated and smaller lesions has resolved. No areas of new or increased T2/FLAIR signal abnormality. Vascular: Major intracranial vascular flow voids are stable and appear normal. Skull and upper cervical spine: Negative visible cervical spine and spinal cord. Calvarium bone marrow signal is stable and within normal limits. Sinuses/Orbits: Stable and negative orbits soft tissues. Mildly to moderately increased paranasal sinus inflammation including a new small fluid level in the right maxillary sinus. Other: Trace mastoid air cell fluid is stable. Visible internal auditory structures appear normal. An oval 15 millimeter cystic area without enhancement in  the right parotid gland on series 18, image 30 appear stable since February 2018 and benign. Other scalp and face soft tissues appear negative. IMPRESSION: 1. Mixed response of disease since November 2018. 2. Solitary new 5 mm metastasis in the medial right cerebellum. 3. All four metastases treated in December 2018 are stable or regressed. 4. Previously treated right frontal lobe operculum metastasis has minimally increased since November 2018, now 10 millimeters. Stable surrounding FLAIR hyperintensity, no mass effect. I favor this is treatment effect and NOT true disease progression. 5. There are now total of 7 brain metastases identified, although some are virtually resolved and minimally  enhancing, each annotated on series 19. Electronically Signed   By: Genevie Ann M.D.   On: 02/03/2018 13:59   Nm Pet Image Restag (ps) Skull Base To Thigh  Result Date: 01/16/2018 CLINICAL DATA:  Subsequent treatment strategy for restaging of renal cell carcinoma. EXAM: NUCLEAR MEDICINE PET SKULL BASE TO THIGH TECHNIQUE: 11.3 mCi F-18 FDG was injected intravenously. Full-ring PET imaging was performed from the skull base to thigh after the radiotracer. CT data was obtained and used for attenuation correction and anatomic localization. FASTING BLOOD GLUCOSE:  Value: 100 mg/dl COMPARISON:  10/25/2017 FINDINGS: NECK: [place]ine tonsil hypermetabolism is likely physiologic. Low left jugular/supraclavicular hypermetabolic nodes are new. Example at 7 mm and a S.U.V. max of 8.0 on image 54/series 4. Mucosal thickening of bilateral maxillary sinuses. CHEST: Right upper lobe pulmonary nodule measures 1.3 cm and a S.U.V. max of 3.6. Compare 9 mm and a S.U.V. max of 2.5 on the prior. Hypermetabolic thoracic nodes. Index left paratracheal node measures 1.6 cm and a S.U.V. max of 12.9 today versus 1.2 cm and a S.U.V. max of 9.4 on the prior. Subcarinal adenopathy measures a S.U.V. max of 17.2 today versus a S.U.V. max of 10.8 on the prior. Left infrahilar adenopathy and adjacent central left lower lobe pulmonary nodularity. Example at 3.9 and 2.2 cm respectively. Compare 3.2 and 1.8 cm respectively on the prior. Mild cardiomegaly. Lad coronary artery atherosclerosis. ABDOMEN/PELVIS: No hypermetabolism to correspond to a 1.3 cm hypoattenuating right liver lobe lesion. Lower pole right renal mass measures 7.7 x 6.9 cm and a S.U.V. max of 22.3 today versus 5.3 x 6.5 cm and a S.U.V. max of 10.1 on the prior. Retroperitoneal hypermetabolic adenopathy, with an index pre caval node measuring 1.8 cm and a S.U.V. max of 16.6 today versus 1.4 cm and a S.U.V. max of 5.5 on the prior. Cholelithiasis. Abdominal aortic atherosclerosis.  Multifocal left-sided colonic hypermetabolism is likely physiologic. SKELETON: Right iliac hypermetabolism measures a S.U.V. max of 8.3 today versus a S.U.V. max of 5.6 on the prior. Lytic lesion within the right-side of L4 measures on the order of 5.7 cm IMPRESSION: 1. Significant progression of disease. New and progressive nodal metastasis within the chest, neck, and abdomen. Progressive pulmonary and osseous metastasis. 2. The right renal primary has enlarged, with progression of hypermetabolism. 3. Coronary artery atherosclerosis. Aortic Atherosclerosis (ICD10-I70.0). Electronically Signed   By: Abigail Miyamoto M.D.   On: 01/16/2018 12:23    Labs:  CBC: Recent Labs    08/29/17 1126 10/13/17 1118 11/11/17 1337 12/21/17 1124 01/24/18 1403 02/06/18 0740  WBC 5.3 7.0 6.2 6.2 7.1 9.4  HGB 15.7 15.3 14.4  --   --  14.0  HCT 44.7 43.1 40.8 41.4 41.2 40.8  PLT 185 233 201 225 272 319    COAGS: Recent Labs    04/22/17 1035 02/06/18 0740  INR  0.86 0.94  APTT  --  24    BMP: Recent Labs    04/23/17 0428  04/25/17 1406  05/05/17 1415  08/29/17 1126 10/13/17 1118 11/11/17 1337 12/21/17 1124 01/24/18 1403  NA 135   < > 132*   < > 131*   < > 142 141 147* 142 143  K 3.4*   < > 3.9   < > 3.9   < > 4.0 3.6 3.9 3.9 3.3  CL 106   < > 101  --  100   < > 103  --  107 107 105  CO2 22   < > 24   < > 23   < > 30 25 28 28  32  GLUCOSE 110*   < > 109*   < > 109*   < > 95 101 107 74 151*  BUN 8   < > 9   < > 8   < > 7 9.1 12 10  6*  CALCIUM 7.6*   < > 8.5*   < > 8.8   < > 9.2 9.2 9.2 9.0 8.5  CREATININE 0.31*   < > 0.60*   < > 0.64*   < > 0.8 0.7 0.9 0.76 0.60  GFRNONAA >60  --  112  --  109  --   --   --   --  >60  --   GFRAA >60  --  130  --  127  --   --   --   --  >60  --    < > = values in this interval not displayed.    LIVER FUNCTION TESTS: Recent Labs    10/13/17 1118 11/11/17 1337 12/21/17 1124 01/24/18 1403  BILITOT 1.02 0.80 0.6 0.7  AST 38* 32 26 25  ALT 34 38 23 21    ALKPHOS 181* 166* 153* 181*  PROT 7.4 6.6 6.7 6.7  ALBUMIN 3.9 3.3 3.4* 3.0*    TUMOR MARKERS: No results for input(s): AFPTM, CEA, CA199, CHROMGRNA in the last 8760 hours.  Assessment and Plan: Patient with past medical history of renal cell carcinoma presents need for Port-A-Cath placement for ongoing treatment.  Patient presents today in their usual state of health.  He does has a skipped beat on exam today which is regular in nature.  He does not believe he has a history of this and is asymptomatic.  Will apply telemetry for procedure. Patient will bring to the attention of his PCP.  He has been NPO and is not currently on blood thinners.  Risks and benefits of image guided port-a-catheter placement was discussed with the patient including, but not limited to bleeding, infection, pneumothorax, or fibrin sheath development and need for additional procedures.  All of the patient's questions were answered, patient is agreeable to proceed. Consent signed and in chart.  Thank you for this interesting consult.  I greatly enjoyed meeting Alexander Fry and look forward to participating in their care.  A copy of this report was sent to the requesting provider on this date.  Electronically Signed: Docia Barrier, PA 02/06/2018, 9:14 AM   I spent a total of  30 Minutes   in face to face in clinical consultation, greater than 50% of which was counseling/coordinating care for renal cell carcinoma.

## 2018-02-06 NOTE — Procedures (Signed)
Pre Procedure Dx: RCC Post Procedural Dx: Same  Successful placement of right IJ approach port-a-cath with tip at the superior caval atrial junction. The catheter is ready for immediate use.  Estimated Blood Loss: Minimal  Complications: None immediate.  Ronny Bacon, MD Pager #: [phone]

## 2018-02-06 NOTE — Discharge Instructions (Signed)
You may remove dressing and bathe in 24 hours.   DO NOT use Emla cream until glue comes off completely, which should be around 2 weeks.     Implanted Port Insertion, Care After This sheet gives you information about how to care for yourself after your procedure. Your health care provider may also give you more specific instructions. If you have problems or questions, contact your health care provider. What can I expect after the procedure? After your procedure, it is common to have:  Discomfort at the port insertion site.  Bruising on the skin over the port. This should improve over 3-4 days.  Follow these instructions at home: [facility] care  After your port is placed, you will get a manufacturer's information card. The card has information about your port. Keep this card with you at all times.  Take care of the port as told by your health care provider. Ask your health care provider if you or a family member can get training for taking care of the port at home. A home health care nurse may also take care of the port.  Make sure to remember what type of port you have. Incision care  Follow instructions from your health care provider about how to take care of your port insertion site. Make sure you: ? Wash your hands with soap and water before you change your bandage (dressing). If soap and water are not available, use hand sanitizer. ? Change your dressing as told by your health care provider. ? Leave stitches (sutures), skin glue, or adhesive strips in place. These skin closures may need to stay in place for 2 weeks or longer. If adhesive strip edges start to loosen and curl up, you may trim the loose edges. Do not remove adhesive strips completely unless your health care provider tells you to do that.  Check your port insertion site every day for signs of infection. Check for: ? More redness, swelling, or pain. ? More fluid or blood. ? Warmth. ? Pus or a bad smell. General  instructions  Do not take baths, swim, or use a hot tub until your health care provider approves.  Do not lift anything that is heavier than 10 lb (4.5 kg) for a week, or as told by your health care provider.  Ask your health care provider when it is okay to: ? Return to work or school. ? Resume usual physical activities or sports.  Do not drive for 24 hours if you were given a medicine to help you relax (sedative).  Take over-the-counter and prescription medicines only as told by your health care provider.  Wear a medical alert bracelet in case of an emergency. This will tell any health care providers that you have a port.  Keep all follow-up visits as told by your health care provider. This is important. Contact a health care provider if:  You cannot flush your port with saline as directed, or you cannot draw blood from the port.  You have a fever or chills.  You have more redness, swelling, or pain around your port insertion site.  You have more fluid or blood coming from your port insertion site.  Your port insertion site feels warm to the touch.  You have pus or a bad smell coming from the port insertion site. Get help right away if:  You have chest pain or shortness of breath.  You have bleeding from your port that you cannot control. Summary  Take care of the  port as told by your health care provider.  Change your dressing as told by your health care provider.  Keep all follow-up visits as told by your health care provider. This information is not intended to replace advice given to you by your health care provider. Make sure you discuss any questions you have with your health care provider. Document Released: 09/12/2013 Document Revised: 10/13/2016 Document Reviewed: 10/13/2016 Elsevier Interactive Patient Education  2017 [employer] An implanted port is a type of central line that is placed under the skin. Central lines are used  to provide IV access when treatment or nutrition needs to be given through a persons veins. Implanted ports are used for long-term IV access. An implanted port may be placed because:  You need IV medicine that would be irritating to the small veins in your hands or arms.  You need long-term IV medicines, such as antibiotics.  You need IV nutrition for a long period.  You need frequent blood draws for lab tests.  You need dialysis.  Implanted ports are usually placed in the chest area, but they can also be placed in the upper arm, the abdomen, or the leg. An implanted port has two main parts:  Reservoir. The reservoir is round and will appear as a small, raised area under your skin. The reservoir is the part where a needle is inserted to give medicines or draw blood.  Catheter. The catheter is a thin, flexible tube that extends from the reservoir. The catheter is placed into a large vein. Medicine that is inserted into the reservoir goes into the catheter and then into the vein.  How will I care for my incision site? Do not get the incision site wet. Bathe or shower as directed by your health care provider. How is my port accessed? Special steps must be taken to access the port:  Before the port is accessed, a numbing cream can be placed on the skin. This helps numb the skin over the port site.  Your health care provider uses a sterile technique to access the port. ? Your health care provider must put on a mask and sterile gloves. ? The skin over your port is cleaned carefully with an antiseptic and allowed to dry. ? The port is gently pinched between sterile gloves, and a needle is inserted into the port.  Only "non-coring" port needles should be used to access the port. Once the port is accessed, a blood return should be checked. This helps ensure that the port is in the vein and is not clogged.  If your port needs to remain accessed for a constant infusion, a clear (transparent)  bandage will be placed over the needle site. The bandage and needle will need to be changed every week, or as directed by your health care provider.  Keep the bandage covering the needle clean and dry. Do not get it wet. Follow your health care providers instructions on how to take a shower or bath while the port is accessed.  If your port does not need to stay accessed, no bandage is needed over the port.  What is flushing? Flushing helps keep the port from getting clogged. Follow your health care providers instructions on how and when to flush the port. Ports are usually flushed with saline solution or a medicine called heparin. The need for flushing will depend on how the port is used.  If the port is used for intermittent medicines or  blood draws, the port will need to be flushed: ? After medicines have been given. ? After blood has been drawn. ? As part of routine maintenance.  If a constant infusion is running, the port may not need to be flushed.  How long will my port stay implanted? The port can stay in for as long as your health care provider thinks it is needed. When it is time for the port to come out, surgery will be done to remove it. The procedure is similar to the one performed when the port was put in. When should I seek immediate medical care? When you have an implanted port, you should seek immediate medical care if:  You notice a bad smell coming from the incision site.  You have swelling, redness, or drainage at the incision site.  You have more swelling or pain at the port site or the surrounding area.  You have a fever that is not controlled with medicine.  This information is not intended to replace advice given to you by your health care provider. Make sure you discuss any questions you have with your health care provider. Document Released: 11/22/2005 Document Revised: 04/29/2016 Document Reviewed: 07/30/2013 Elsevier Interactive Patient Education  2017  [place]. Moderate Conscious Sedation, Adult, Care After These instructions provide you with information about caring for yourself after your procedure. Your health care provider may also give you more specific instructions. Your treatment has been planned according to current medical practices, but problems sometimes occur. Call your health care provider if you have any problems or questions after your procedure. What can I expect after the procedure? After your procedure, it is common:  To feel sleepy for several hours.  To feel clumsy and have poor balance for several hours.  To have poor judgment for several hours.  To vomit if you eat too soon.  Follow these instructions at home: For at least 24 hours after the procedure:   Do not: ? Participate in activities where you could fall or become injured. ? Drive. ? Use heavy machinery. ? Drink alcohol. ? Take sleeping pills or medicines that cause drowsiness. ? Make important decisions or sign legal documents. ? Take care of children on your own.  Rest. Eating and drinking  Follow the diet recommended by your health care provider.  If you vomit: ? Drink water, juice, or soup when you can drink without vomiting. ? Make sure you have little or no nausea before eating solid foods. General instructions  Have a responsible adult stay with you until you are awake and alert.  Take over-the-counter and prescription medicines only as told by your health care provider.  If you smoke, do not smoke without supervision.  Keep all follow-up visits as told by your health care provider. This is important. Contact a health care provider if:  You keep feeling nauseous or you keep vomiting.  You feel light-headed.  You develop a rash.  You have a fever. Get help right away if:  You have trouble breathing. This information is not intended to replace advice given to you by your health care provider. Make sure you discuss any  questions you have with your health care provider. Document Released: 09/12/2013 Document Revised: 04/26/2016 Document Reviewed: 03/13/2016 Elsevier Interactive Patient Education  [employer].

## 2018-02-07 ENCOUNTER — Encounter: Payer: Self-pay | Admitting: Hematology & Oncology

## 2018-02-07 ENCOUNTER — Ambulatory Visit
Admission: RE | Admit: 2018-02-07 | Discharge: 2018-02-07 | Disposition: A | Discharge status: Home / Self Care | Payer: BLUE CROSS/BLUE SHIELD | Source: Ambulatory Visit | Attending: Radiation Oncology | Admitting: Radiation Oncology

## 2018-02-07 ENCOUNTER — Telehealth: Payer: Self-pay | Admitting: *Deleted

## 2018-02-07 ENCOUNTER — Encounter: Payer: Self-pay | Admitting: Radiation Oncology

## 2018-02-07 ENCOUNTER — Other Ambulatory Visit: Payer: Self-pay | Admitting: *Deleted

## 2018-02-07 ENCOUNTER — Inpatient Hospital Stay: Payer: BLUE CROSS/BLUE SHIELD

## 2018-02-07 ENCOUNTER — Inpatient Hospital Stay: Payer: BLUE CROSS/BLUE SHIELD | Attending: Family

## 2018-02-07 VITALS — BP 141/77 | HR 91 | Temp 100.3°F | Ht 72.0 in | Wt 239.4 lb

## 2018-02-07 DIAGNOSIS — C7951 Secondary malignant neoplasm of bone: Secondary | ICD-10-CM | POA: Insufficient documentation

## 2018-02-07 DIAGNOSIS — Z923 Personal history of irradiation: Secondary | ICD-10-CM | POA: Diagnosis not present

## 2018-02-07 DIAGNOSIS — E032 Hypothyroidism due to medicaments and other exogenous substances: Secondary | ICD-10-CM

## 2018-02-07 DIAGNOSIS — Z5112 Encounter for antineoplastic immunotherapy: Secondary | ICD-10-CM | POA: Insufficient documentation

## 2018-02-07 DIAGNOSIS — Z85528 Personal history of other malignant neoplasm of kidney: Secondary | ICD-10-CM | POA: Insufficient documentation

## 2018-02-07 DIAGNOSIS — C649 Malignant neoplasm of unspecified kidney, except renal pelvis: Secondary | ICD-10-CM | POA: Diagnosis present

## 2018-02-07 DIAGNOSIS — Z79899 Other long term (current) drug therapy: Secondary | ICD-10-CM | POA: Insufficient documentation

## 2018-02-07 DIAGNOSIS — C7931 Secondary malignant neoplasm of brain: Secondary | ICD-10-CM | POA: Insufficient documentation

## 2018-02-07 DIAGNOSIS — C641 Malignant neoplasm of right kidney, except renal pelvis: Secondary | ICD-10-CM

## 2018-02-07 DIAGNOSIS — R221 Localized swelling, mass and lump, neck: Secondary | ICD-10-CM | POA: Insufficient documentation

## 2018-02-07 LAB — CBC WITH DIFFERENTIAL (CANCER CENTER ONLY)
BASOS ABS: 0 10*3/uL (ref 0.0–0.1)
Basophils Relative: 0 %
EOS PCT: 2 %
Eosinophils Absolute: 0.2 10*3/uL (ref 0.0–0.5)
HCT: 39 % (ref 38.7–49.9)
HEMOGLOBIN: 13 g/dL (ref 13.0–17.1)
LYMPHS PCT: 26 %
Lymphs Abs: 2.1 10*3/uL (ref 0.9–3.3)
MCH: 31 pg (ref 28.0–33.4)
MCHC: 33.3 g/dL (ref 32.0–35.9)
MCV: 92.9 fL (ref 82.0–98.0)
Monocytes Absolute: 0.9 10*3/uL (ref 0.1–0.9)
Monocytes Relative: 11 %
NEUTROS PCT: 61 %
Neutro Abs: 4.9 10*3/uL (ref 1.5–6.5)
PLATELETS: 307 10*3/uL (ref 145–400)
RBC: 4.2 MIL/uL (ref 4.20–5.70)
RDW: 13.2 % (ref 11.1–15.7)
WBC: 8.1 10*3/uL (ref 4.0–10.0)

## 2018-02-07 LAB — CMP (CANCER CENTER ONLY)
ALK PHOS: 209 U/L — AB (ref 26–84)
ALT: 21 U/L (ref 10–47)
ANION GAP: 7 (ref 5–15)
AST: 25 U/L (ref 11–38)
Albumin: 2.9 g/dL — ABNORMAL LOW (ref 3.5–5.0)
BILIRUBIN TOTAL: 0.7 mg/dL (ref 0.2–1.6)
BUN: 10 mg/dL (ref 7–22)
CO2: 28 mmol/L (ref 18–33)
Calcium: 9.2 mg/dL (ref 8.0–10.3)
Chloride: 107 mmol/L (ref 98–108)
Creatinine: 0.7 mg/dL (ref 0.60–1.20)
Glucose, Bld: 121 mg/dL — ABNORMAL HIGH (ref 73–118)
POTASSIUM: 3.3 mmol/L (ref 3.3–4.7)
SODIUM: 142 mmol/L (ref 128–145)
TOTAL PROTEIN: 7.3 g/dL (ref 6.4–8.1)

## 2018-02-07 MED ORDER — SODIUM CHLORIDE 0.9 % IV SOLN: 100.0000 mg | Freq: Once | INTRAVENOUS | Status: DC

## 2018-02-07 MED ORDER — DIPHENHYDRAMINE HCL 50 MG/ML IJ SOLN
INTRAMUSCULAR | Status: AC
  Filled 2018-02-07: qty 1

## 2018-02-07 MED ORDER — HEPARIN SOD (PORK) LOCK FLUSH 100 UNIT/ML IV SOLN
500.0000 [IU] | Freq: Once | INTRAVENOUS | Status: AC | PRN
  Administered 2018-02-07: 500 [IU]
  Filled 2018-02-07: qty 5

## 2018-02-07 MED ORDER — FAMOTIDINE IN NACL 20-0.9 MG/50ML-% IV SOLN
INTRAVENOUS | Status: AC
  Filled 2018-02-07: qty 50

## 2018-02-07 MED ORDER — SODIUM CHLORIDE 0.9% FLUSH
10.0000 mL | INTRAVENOUS | Status: DC | PRN
  Administered 2018-02-07: 10 mL
  Filled 2018-02-07: qty 10

## 2018-02-07 MED ORDER — SODIUM CHLORIDE 0.9 % IV SOLN: 300.0000 mg | Freq: Once | INTRAVENOUS | Status: DC

## 2018-02-07 MED ORDER — DIPHENHYDRAMINE HCL 50 MG/ML IJ SOLN
25.0000 mg | Freq: Once | INTRAMUSCULAR | Status: AC
  Administered 2018-02-07: 25 mg via INTRAVENOUS

## 2018-02-07 MED ORDER — SODIUM CHLORIDE 0.9 % IV SOLN
Freq: Once | INTRAVENOUS | Status: AC
  Administered 2018-02-07: 09:00:00 via INTRAVENOUS

## 2018-02-07 MED ORDER — FAMOTIDINE IN NACL 20-0.9 MG/50ML-% IV SOLN
20.0000 mg | Freq: Once | INTRAVENOUS | Status: AC
  Administered 2018-02-07: 20 mg via INTRAVENOUS

## 2018-02-07 MED ORDER — SODIUM CHLORIDE 0.9 % IV SOLN
100.0000 mg | Freq: Once | INTRAVENOUS | Status: AC
  Administered 2018-02-07: 100 mg via INTRAVENOUS
  Filled 2018-02-07: qty 20

## 2018-02-07 MED ORDER — SODIUM CHLORIDE 0.9 % IV SOLN
340.0000 mg | Freq: Once | INTRAVENOUS | Status: AC
  Administered 2018-02-07: 340 mg via INTRAVENOUS
  Filled 2018-02-07: qty 10

## 2018-02-07 NOTE — Patient Instructions (Signed)
For any signs and symptoms of diarrhea: Immediately start 80 mg (8 tablets) by mouth daily for 3 days  Reduce by 10 mg for 3 days thereafter:  70 mg (7 tablets) by mouth daily for 3 days  60 mg (6 tablets) by mouth daily for 3 days  50 mg (5 tablets) by mouth daily for 3 days  40 mg (4 tablets) by mouth daily for 3 days  30 mg (3 tablets) by mouth daily for 3 days  20 mg (2 tablets) by mouth daily for 3 days  10 mg (1 tablet) by mouth daily for 3 days 5 mg (1/2 tablet) by mouth daily for 3 days.Nivolumab injection What is this medicine? NIVOLUMAB (nye VOL ue mab) is a monoclonal antibody. It is used to treat melanoma, lung cancer, kidney cancer, head and neck cancer, Hodgkin lymphoma, urothelial cancer, colon cancer, and liver cancer. This medicine may be used for other purposes; ask your health care provider or pharmacist if you have questions. COMMON BRAND NAME(S): Opdivo What should I tell my health care provider before I take this medicine? They need to know if you have any of these conditions: -diabetes -immune system problems -kidney disease -liver disease -lung disease -organ transplant -stomach or intestine problems -thyroid disease -an unusual or allergic reaction to nivolumab, other medicines, foods, dyes, or preservatives -pregnant or trying to get pregnant -breast-feeding How should I use this medicine? This medicine is for infusion into a vein. It is given by a health care professional in a hospital or clinic setting. A special MedGuide will be given to you before each treatment. Be sure to read this information carefully each time. Talk to your pediatrician regarding the use of this medicine in children. While this drug may be prescribed for children as young as 12 years for selected conditions, precautions do apply. Overdosage: If you think you have taken too much of this medicine contact a poison control center or emergency room at once. NOTE: This medicine is  only for you. Do not share this medicine with others. What if I miss a dose? It is important not to miss your dose. Call your doctor or health care professional if you are unable to keep an appointment. What may interact with this medicine? Interactions have not been studied. Give your health care provider a list of all the medicines, herbs, non-prescription drugs, or dietary supplements you use. Also tell them if you smoke, drink alcohol, or use illegal drugs. Some items may interact with your medicine. This list may not describe all possible interactions. Give your health care provider a list of all the medicines, herbs, non-prescription drugs, or dietary supplements you use. Also tell them if you smoke, drink alcohol, or use illegal drugs. Some items may interact with your medicine. What should I watch for while using this medicine? This drug may make you feel generally unwell. Continue your course of treatment even though you feel ill unless your doctor tells you to stop. You may need blood work done while you are taking this medicine. Do not become pregnant while taking this medicine or for 5 months after stopping it. Women should inform their doctor if they wish to become pregnant or think they might be pregnant. There is a potential for serious side effects to an unborn child. Talk to your health care professional or pharmacist for more information. Do not breast-feed an infant while taking this medicine. What side effects may I notice from receiving this medicine? Side effects that  you should report to your doctor or health care professional as soon as possible: -allergic reactions like skin rash, itching or hives, swelling of the face, lips, or tongue -black, tarry stools -blood in the urine -bloody or watery diarrhea -changes in vision -change in sex drive -changes in emotions or moods -chest pain -confusion -cough -decreased appetite -diarrhea -facial flushing -feeling faint or  lightheaded -fever, chills -hair loss -hallucination, loss of contact with reality -headache -irritable -joint pain -loss of memory -muscle pain -muscle weakness -seizures -shortness of breath -signs and symptoms of high blood sugar such as dizziness; dry mouth; dry skin; fruity breath; nausea; stomach pain; increased hunger or thirst; increased urination -signs and symptoms of kidney injury like trouble passing urine or change in the amount of urine -signs and symptoms of liver injury like dark yellow or brown urine; general ill feeling or flu-like symptoms; light-colored stools; loss of appetite; nausea; right upper belly pain; unusually weak or tired; yellowing of the eyes or skin -stiff neck -swelling of the ankles, feet, hands -weight gain Side effects that usually do not require medical attention (report to your doctor or health care professional if they continue or are bothersome): -bone pain -constipation -tiredness -vomiting This list may not describe all possible side effects. Call your doctor for medical advice about side effects. You may report side effects to FDA at 1-800-FDA-1088. Where should I keep my medicine? This drug is given in a hospital or clinic and will not be stored at home. NOTE: This sheet is a summary. It may not cover all possible information. If you have questions about this medicine, talk to your doctor, pharmacist, or health care provider.  2018 [employer]/Gold Standard (2016-08-30 17:49:34) Famotidine injection What is this medicine? FAMOTIDINE (fa MOE ti deen) is a type of antihistamine that blocks the release of stomach acid. It is used to treat stomach or intestinal ulcers. It can relieve ulcer pain and discomfort, and the heartburn from acid reflux. This medicine may be used for other purposes; ask your health care provider or pharmacist if you have questions. COMMON BRAND NAME(S): Pepcid What should I tell my health care provider before I take  this medicine? They need to know if you have any of these conditions: -kidney or liver disease -an unusual or allergic reaction to famotidine, other medicines, foods, dyes, or preservatives -pregnant or trying to get pregnant -breast-feeding How should I use this medicine? This medicine is for infusion into a vein. It is given by a health care professional in a hospital or clinic setting. Talk to your pediatrician regarding the use of this medicine in children. Special care may be needed. Overdosage: If you think you have taken too much of this medicine contact a poison control center or emergency room at once. NOTE: This medicine is only for you. Do not share this medicine with others. What if I miss a dose? This does not apply. What may interact with this medicine? -delavirdine -itraconazole -ketoconazole This list may not describe all possible interactions. Give your health care provider a list of all the medicines, herbs, non-prescription drugs, or dietary supplements you use. Also tell them if you smoke, drink alcohol, or use illegal drugs. Some items may interact with your medicine. What should I watch for while using this medicine? Tell your doctor or health care professional if your condition does not start to get better or gets worse. Do not take with aspirin, ibuprofen, or other antiinflammatory medicines. These can aggravate your condition. Do  not smoke cigarettes or drink alcohol. These increase irritation in your stomach and can increase the time it will take for ulcers to heal. Cigarettes and alcohol can also worsen acid reflux or heartburn. If you get black, tarry stools or vomit up what looks like coffee grounds, call your doctor or health care professional at once. You may have a bleeding ulcer. What side effects may I notice from receiving this medicine? Side effects that you should report to your doctor or health care professional as soon as possible: -allergic reactions  like skin rash, itching or hives, swelling of the face, lips, or tongue -agitation, nervousness -confusion -hallucinations Side effects that usually do not require medical attention (report to your doctor or health care professional if they continue or are bothersome): -constipation -diarrhea -dizziness -headache This list may not describe all possible side effects. Call your doctor for medical advice about side effects. You may report side effects to FDA at 1-800-FDA-1088. Where should I keep my medicine? This medicine is given in a hospital or clinic. You will not be given this medicine to store at home. NOTE: This sheet is a summary. It may not cover all possible information. If you have questions about this medicine, talk to your doctor, pharmacist, or health care provider.  2018 [employer]/Gold Standard (2008-03-27 13:24:51) Diphenhydramine injection What is this medicine? DIPHENHYDRAMINE (dye fen HYE dra meen) is an antihistamine. It is used to treat the symptoms of an allergic reaction and motion sickness. It is also used to treat Parkinson's disease. This medicine may be used for other purposes; ask your health care provider or pharmacist if you have questions. COMMON BRAND NAME(S): Benadryl What should I tell my health care provider before I take this medicine? They need to know if you have any of these conditions: -asthma or lung disease -glaucoma -high blood pressure or heart disease -liver disease -pain or difficulty passing urine -prostate trouble -ulcers or other stomach problems -an unusual or allergic reaction to diphenhydramine, antihistamines, other medicines foods, dyes, or preservatives -pregnant or trying to get pregnant -breast-feeding How should I use this medicine? This medicine is for injection into a vein or a muscle. It is usually given by a health care professional in a hospital or clinic setting. If you get this medicine at home, you will be taught how to  prepare and give this medicine. Use exactly as directed. Take your medicine at regular intervals. Do not take your medicine more often than directed. It is important that you put your used needles and syringes in a special sharps container. Do not put them in a trash can. If you do not have a sharps container, call your pharmacist or healthcare provider to get one. Talk to your pediatrician regarding the use of this medicine in children. While this drug may be prescribed for selected conditions, precautions do apply. This medicine is not approved for use in newborns and premature babies. Patients over 67 years old may have a stronger reaction and need a smaller dose. Overdosage: If you think you have taken too much of this medicine contact a poison control center or emergency room at once. NOTE: This medicine is only for you. Do not share this medicine with others. What if I miss a dose? If you miss a dose, take it as soon as you can. If it is almost time for your next dose, take only that dose. Do not take double or extra doses. What may interact with this medicine? Do not take  this medicine with any of the following medications: -MAOIs like Carbex, Eldepryl, Marplan, Nardil, and Parnate This medicine may also interact with the following medications: -alcohol -barbiturates, like phenobarbital -medicines for bladder spasm like oxybutynin, tolterodine -medicines for blood pressure -medicines for depression, anxiety, or psychotic disturbances -medicines for movement abnormalities or Parkinson's disease -medicines for sleep -other medicines for cold, cough or allergy -some medicines for the stomach like chlordiazepoxide, dicyclomine This list may not describe all possible interactions. Give your health care provider a list of all the medicines, herbs, non-prescription drugs, or dietary supplements you use. Also tell them if you smoke, drink alcohol, or use illegal drugs. Some items may interact  with your medicine. What should I watch for while using this medicine? Your condition will be monitored carefully while you are receiving this medicine. Tell your doctor or healthcare professional if your symptoms do not start to get better or if they get worse. You may get drowsy or dizzy. Do not drive, use machinery, or do anything that needs mental alertness until you know how this medicine affects you. Do not stand or sit up quickly, especially if you are an older patient. This reduces the risk of dizzy or fainting spells. Alcohol may interfere with the effect of this medicine. Avoid alcoholic drinks. Your mouth may get dry. Chewing sugarless gum or sucking hard candy, and drinking plenty of water may help. Contact your doctor if the problem does not go away or is severe. What side effects may I notice from receiving this medicine? Side effects that you should report to your doctor or health care professional as soon as possible: -allergic reactions like skin rash, itching or hives, swelling of the face, lips, or tongue -breathing problems -changes in vision -chills -confused, agitated, nervous -irregular or fast heartbeat -low blood pressure -seizures -tremor -trouble passing urine -unusual bleeding or bruising -unusually weak or tired Side effects that usually do not require medical attention (report to your doctor or health care professional if they continue or are bothersome): -constipation, diarrhea -drowsy -headache -loss of appetite -stomach upset, vomiting -sweating -thick mucous This list may not describe all possible side effects. Call your doctor for medical advice about side effects. You may report side effects to FDA at 1-800-FDA-1088. Where should I keep my medicine? Keep out of the reach of children. If you are using this medicine at home, you will be instructed on how to store this medicine. Throw away any unused medicine after the expiration date on the  label. NOTE: This sheet is a summary. It may not cover all possible information. If you have questions about this medicine, talk to your doctor, pharmacist, or health care provider.  2018 [employer]/Gold Standard (2008-03-12 14:28:35)

## 2018-02-07 NOTE — Progress Notes (Signed)
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 He reports fatigue. He reports throbbing to his forehead area occasionally since completing radiation. He will lay down which helps this pain or take Tylenol as needed. His cancer has progressed per PET scan 01/16/18 and he is receiving immunotherapy treatment at Dr. Antonieta Pert office. He received his first dose today. He will see Dr. Marin Olp again on 02/28/18. He had an MRI 02/03/18 and is here for the results.   BP (!) 141/77   Pulse 91   Temp 100.3 F (37.9 C)   Ht 6' (1.829 m)   Wt 239 lb 6.4 oz (108.6 kg)   SpO2 95%   BMI 32.47 kg/m    Wt Readings from Last 3 Encounters:  02/07/18 239 lb 6.4 oz (108.6 kg)  01/24/18 237 lb (107.5 kg)  12/23/17 233 lb (105.7 kg)

## 2018-02-07 NOTE — Progress Notes (Signed)
Radiation Oncology         [phone] ________________________________  Name: Alexander Fry MRN: [id number]  Date: 02/07/2018  DOB: 02/21/1961  Follow-Up Visit Note  Outpatient  CC: Cummings, Charley Elizabeth, PA-C  Ennever, Peter R, MD  Diagnosis:   56 y.o. gentleman with brain metastases     ICD-10-CM   1. Bone metastases (HCC) C79.51   2. Brain metastases (HCC) C79.31     CHIEF COMPLAINT: Here for follow-up and surveillance of brain cancer.  Interval Since Last Radiation:  3 months 11/18/17: 20 Gy delivered to the brain in one fraction using Photon // 6FFF  Narrative:  The patient returns today for routine follow-up to review recent imaging results. PET scan on 01/16/2018 showed significant progression of disease. There was new and progressive nodal metastasis within the chest, neck, and abdomen as well as progressive pulmonary and osseous metastasis. The right renal primary had enlarged, with progression of hypermetabolism.   Brain MRI on 02/03/2018 showed a mixed response of disease since November 2018. There is a new solitary 5 mm metastasis in the medial right cerebellum. All four metastases treated by SRS in December 2018 are stable or regressed. The previously treated right frontal lobe operculum metastasis has minimally increased since November 2018, now 10 mm, with stable surrounding flair hyperintensity and no mass effect. This is favored as treatment effect and not true disease progression.   Symptomatically, the patient reports pain in his lower back and anterior upper right thigh. He reports throbbing pain that extends from temple to temple. He reports a persistent sinus infection and congestion. We reviewed his MRI which showed showed mildly to moderately increased paranasal sinus inflammation including a new small fluid level in the right maxillary sinus.  Right pelvic pain in the area of SUV uptake (ilium).   ALLERGIES:  has No Known Allergies.  Meds: Current  Outpatient Medications  Medication Sig Dispense Refill  . acetaminophen (TYLENOL) 325 MG tablet Take 650 mg by mouth every 6 (six) hours as needed.    . guaiFENesin (MUCINEX) 600 MG 12 hr tablet Take by mouth 2 (two) times daily.    . lidocaine-prilocaine (EMLA) cream Apply to affected area once 30 g 3  . CABOMETYX 40 MG TABS     . cetirizine (ZYRTEC) 10 MG tablet Take 10 mg by mouth daily. He is taking it 3 times daily     No current facility-administered medications for this encounter.     Physical Findings: Ambulatory. Alert and oriented, in no acute distress. Further exam deferred.  height is 6' (1.829 m) and weight is 239 lb 6.4 oz (108.6 kg). His temperature is 100.3 F (37.9 C). His blood pressure is 141/77 (abnormal) and his pulse is 91. His oxygen saturation is 95%.     Lab Findings: Lab Results  Component Value Date   WBC 8.1 02/07/2018   HGB 14.0 02/06/2018   HCT 39.0 02/07/2018   MCV 92.9 02/07/2018   PLT 307 02/07/2018    @LASTCHEMISTRY@  Radiographic Findings: Mr Brain W Wo Contrast  Result Date: 02/03/2018 CLINICAL DATA:  56-year-old male with metastatic renal cell carcinoma status radiation treatment for 4 New brain metastases in December 2018. In December 2018. EXAM: MRI HEAD WITHOUT AND WITH CONTRAST TECHNIQUE: Multiplanar, multiecho pulse sequences of the brain and surrounding structures were obtained without and with intravenous contrast. CONTRAST:  20mL MULTIHANCE GADOBENATE DIMEGLUMINE 529 MG/ML IV SOLN COMPARISON:  10/31/2017 brain MRI and earlier. FINDINGS: BRAIN New Lesions: Five mm   enhancing lesion located in the medial right cerebellum near the vermis with no surrounding edema and no mass effect seen on series 19, image 37. Larger lesions: Treated enhancing lesion located along the right frontal operculum has slightly increased from roughly 7 mm to now 9-10 mm, with stable surrounding FLAIR hyperintensity (series 7, image 36) and no mass effect. See series  19, image 115. Cerebral perfusion imaging was performed, and evaluation of cerebral blood volume maps suggests no increased volume. Stable or Smaller lesions: Largely resolved, now minimally enhancing lesion located right lateral cerebellum and seen on series 19, image 43. Punctate, minimally enhancing lesion located right lower occipital lobe and seen on series 19, image 61. 2 millimeter mm enhancing lesion located upper right occipital lobe and seen on series 19, image 87. Punctate, minimally enhancing lesion located left superior parietal lobe and seen on series 19, image 133. Largely resolved, minimally enhancing punctate lesion located right superior frontal gyrus and seen on series 19, image 150. Other Brain findings: Stable cerebral volume. No restricted diffusion to suggest acute infarction. No midline shift, mass effect, extra-axial collection or acute intracranial hemorrhage. Cervicomedullary junction and pituitary are within normal limits. Mild edema associated with some of the treated and smaller lesions has resolved. No areas of new or increased T2/FLAIR signal abnormality. Vascular: Major intracranial vascular flow voids are stable and appear normal. Skull and upper cervical spine: Negative visible cervical spine and spinal cord. Calvarium bone marrow signal is stable and within normal limits. Sinuses/Orbits: Stable and negative orbits soft tissues. Mildly to moderately increased paranasal sinus inflammation including a new small fluid level in the right maxillary sinus. Other: Trace mastoid air cell fluid is stable. Visible internal auditory structures appear normal. An oval 15 millimeter cystic area without enhancement in the right parotid gland on series 18, image 30 appear stable since February 2018 and benign. Other scalp and face soft tissues appear negative. IMPRESSION: 1. Mixed response of disease since November 2018. 2. Solitary new 5 mm metastasis in the medial right cerebellum. 3. All four  metastases treated in December 2018 are stable or regressed. 4. Previously treated right frontal lobe operculum metastasis has minimally increased since November 2018, now 10 millimeters. Stable surrounding FLAIR hyperintensity, no mass effect. I favor this is treatment effect and NOT true disease progression. 5. There are now total of 7 brain metastases identified, although some are virtually resolved and minimally enhancing, each annotated on series 19. Electronically Signed   By: Genevie Ann M.D.   On: 02/03/2018 13:59   Nm Pet Image Restag (ps) Skull Base To Thigh  Result Date: 01/16/2018 CLINICAL DATA:  Subsequent treatment strategy for restaging of renal cell carcinoma. EXAM: NUCLEAR MEDICINE PET SKULL BASE TO THIGH TECHNIQUE: 11.3 mCi F-18 FDG was injected intravenously. Full-ring PET imaging was performed from the skull base to thigh after the radiotracer. CT data was obtained and used for attenuation correction and anatomic localization. FASTING BLOOD GLUCOSE:  Value: 100 mg/dl COMPARISON:  10/25/2017 FINDINGS: NECK: Palatine tonsil hypermetabolism is likely physiologic. Low left jugular/supraclavicular hypermetabolic nodes are new. Example at 7 mm and a S.U.V. max of 8.0 on image 54/series 4. Mucosal thickening of bilateral maxillary sinuses. CHEST: Right upper lobe pulmonary nodule measures 1.3 cm and a S.U.V. max of 3.6. Compare 9 mm and a S.U.V. max of 2.5 on the prior. Hypermetabolic thoracic nodes. Index left paratracheal node measures 1.6 cm and a S.U.V. max of 12.9 today versus 1.2 cm and a S.U.V. max of  9.4 on the prior. Subcarinal adenopathy measures a S.U.V. max of 17.2 today versus a S.U.V. max of 10.8 on the prior. Left infrahilar adenopathy and adjacent central left lower lobe pulmonary nodularity. Example at 3.9 and 2.2 cm respectively. Compare 3.2 and 1.8 cm respectively on the prior. Mild cardiomegaly. Lad coronary artery atherosclerosis. ABDOMEN/PELVIS: No hypermetabolism to correspond to  a 1.3 cm hypoattenuating right liver lobe lesion. Lower pole right renal mass measures 7.7 x 6.9 cm and a S.U.V. max of 22.3 today versus 5.3 x 6.5 cm and a S.U.V. max of 10.1 on the prior. Retroperitoneal hypermetabolic adenopathy, with an index pre caval node measuring 1.8 cm and a S.U.V. max of 16.6 today versus 1.4 cm and a S.U.V. max of 5.5 on the prior. Cholelithiasis. Abdominal aortic atherosclerosis. Multifocal left-sided colonic hypermetabolism is likely physiologic. SKELETON: Right iliac hypermetabolism measures a S.U.V. max of 8.3 today versus a S.U.V. max of 5.6 on the prior. Lytic lesion within the right-side of L4 measures on the order of 5.7 cm IMPRESSION: 1. Significant progression of disease. New and progressive nodal metastasis within the chest, neck, and abdomen. Progressive pulmonary and osseous metastasis. 2. The right renal primary has enlarged, with progression of hypermetabolism. 3. Coronary artery atherosclerosis. Aortic Atherosclerosis (ICD10-I70.0). Electronically Signed   By: Kyle  Talbot M.D.   On: 01/16/2018 12:23   Ir Us Guide Vasc Access Right  Result Date: 02/06/2018 INDICATION: History of metastatic renal cell carcinoma. In need of durable intravenous access for chemotherapy administration. EXAM: IMPLANTED PORT A CATH PLACEMENT WITH ULTRASOUND AND FLUOROSCOPIC GUIDANCE COMPARISON:  PET-CT - 01/16/2018 MEDICATIONS: Ancef 2 gm IV; The antibiotic was administered within an appropriate time interval prior to skin puncture. ANESTHESIA/SEDATION: Moderate (conscious) sedation was employed during this procedure. A total of Versed 3 mg and Fentanyl 100 mcg was administered intravenously. Moderate Sedation Time: 24 minutes. The patient's level of consciousness and vital signs were monitored continuously by radiology nursing throughout the procedure under my direct supervision. CONTRAST:  None FLUOROSCOPY TIME:  24 seconds (12 mGy) COMPLICATIONS: None immediate. PROCEDURE: The procedure,  risks, benefits, and alternatives were explained to the patient. Questions regarding the procedure were encouraged and answered. The patient understands and consents to the procedure. The right neck and chest were prepped with chlorhexidine in a sterile fashion, and a sterile drape was applied covering the operative field. Maximum barrier sterile technique with sterile gowns and gloves were used for the procedure. A timeout was performed prior to the initiation of the procedure. Local anesthesia was provided with 1% lidocaine with epinephrine. After creating a small venotomy incision, a micropuncture kit was utilized to access the internal jugular vein. Real-time ultrasound guidance was utilized for vascular access including the acquisition of a permanent ultrasound image documenting patency of the accessed vessel. The microwire was utilized to measure appropriate catheter length. A subcutaneous port pocket was then created along the upper chest wall utilizing a combination of sharp and blunt dissection. The pocket was irrigated with sterile saline. A single lumen thin power injectable port was chosen for placement. The 8 Fr catheter was tunneled from the port pocket site to the venotomy incision. The port was placed in the pocket. The external catheter was trimmed to appropriate length. At the venotomy, an 8 Fr peel-away sheath was placed over a guidewire under fluoroscopic guidance. The catheter was then placed through the sheath and the sheath was removed. Final catheter positioning was confirmed and documented with a fluoroscopic spot radiograph. The port was   accessed with a Huber needle, aspirated and flushed with heparinized saline. The venotomy site was closed with an interrupted 4-0 Vicryl suture. The port pocket incision was closed with interrupted 2-0 Vicryl suture and the skin was opposed with a running subcuticular 4-0 Vicryl suture. Dermabond and Steri-strips were applied to both incisions. Dressings  were placed. The patient tolerated the procedure well without immediate post procedural complication. FINDINGS: After catheter placement, the tip lies within the superior cavoatrial junction. The catheter aspirates and flushes normally and is ready for immediate use. IMPRESSION: Successful placement of a right internal jugular approach power injectable Port-A-Cath. The catheter is ready for immediate use. Electronically Signed   By: John  Watts M.D.   On: 02/06/2018 11:43   Ir Fluoro Guide Port Insertion Right  Result Date: 02/06/2018 INDICATION: History of metastatic renal cell carcinoma. In need of durable intravenous access for chemotherapy administration. EXAM: IMPLANTED PORT A CATH PLACEMENT WITH ULTRASOUND AND FLUOROSCOPIC GUIDANCE COMPARISON:  PET-CT - 01/16/2018 MEDICATIONS: Ancef 2 gm IV; The antibiotic was administered within an appropriate time interval prior to skin puncture. ANESTHESIA/SEDATION: Moderate (conscious) sedation was employed during this procedure. A total of Versed 3 mg and Fentanyl 100 mcg was administered intravenously. Moderate Sedation Time: 24 minutes. The patient's level of consciousness and vital signs were monitored continuously by radiology nursing throughout the procedure under my direct supervision. CONTRAST:  None FLUOROSCOPY TIME:  24 seconds (12 mGy) COMPLICATIONS: None immediate. PROCEDURE: The procedure, risks, benefits, and alternatives were explained to the patient. Questions regarding the procedure were encouraged and answered. The patient understands and consents to the procedure. The right neck and chest were prepped with chlorhexidine in a sterile fashion, and a sterile drape was applied covering the operative field. Maximum barrier sterile technique with sterile gowns and gloves were used for the procedure. A timeout was performed prior to the initiation of the procedure. Local anesthesia was provided with 1% lidocaine with epinephrine. After creating a small  venotomy incision, a micropuncture kit was utilized to access the internal jugular vein. Real-time ultrasound guidance was utilized for vascular access including the acquisition of a permanent ultrasound image documenting patency of the accessed vessel. The microwire was utilized to measure appropriate catheter length. A subcutaneous port pocket was then created along the upper chest wall utilizing a combination of sharp and blunt dissection. The pocket was irrigated with sterile saline. A single lumen thin power injectable port was chosen for placement. The 8 Fr catheter was tunneled from the port pocket site to the venotomy incision. The port was placed in the pocket. The external catheter was trimmed to appropriate length. At the venotomy, an 8 Fr peel-away sheath was placed over a guidewire under fluoroscopic guidance. The catheter was then placed through the sheath and the sheath was removed. Final catheter positioning was confirmed and documented with a fluoroscopic spot radiograph. The port was accessed with a Huber needle, aspirated and flushed with heparinized saline. The venotomy site was closed with an interrupted 4-0 Vicryl suture. The port pocket incision was closed with interrupted 2-0 Vicryl suture and the skin was opposed with a running subcuticular 4-0 Vicryl suture. Dermabond and Steri-strips were applied to both incisions. Dressings were placed. The patient tolerated the procedure well without immediate post procedural complication. FINDINGS: After catheter placement, the tip lies within the superior cavoatrial junction. The catheter aspirates and flushes normally and is ready for immediate use. IMPRESSION: Successful placement of a right internal jugular approach power injectable Port-A-Cath. The catheter is   ready for immediate use. Electronically Signed   By: John  Watts M.D.   On: 02/06/2018 11:43    Impression/Plan:  56 y.o. gentleman with brain metastases. I have reviewed his images. He  has a new 5 mm metastasis in the medial right cerebellum that we will treat with SRS. He has travel plans over the next 3 weeks, so we will schedule this for after March 26th. We will arrange a brain MRI to precede that appointment - there may be other new lesions by then, possibly, to target.  And, the 5mm new lesion may grow in the interim, warranting another MRI.  I will also make a referral to ENT for evaluation of his sinus symptoms that are persistent despite help from med/onc and his PCP.  We will consider palliative RT to the right ilium upon his return from his travels, as well.  I spent at least 10 minutes face to face with the patient, over 50% of that time on counseling and care coordination.  ___________________________________   Sarah Squire, MD  This document serves as a record of services personally performed by Sarah Squire, MD. It was created on her behalf by Haley Woodruff, a trained medical scribe. The creation of this record is based on the scribe's personal observations and the provider's statements to them. This document has been checked and approved by the attending provider.  

## 2018-02-07 NOTE — Telephone Encounter (Signed)
Patient was in the office this am and received Opdivo and Yervoy for the first time today. He is now home with a temp of 101.8.  Discussed case with Dr Alvy Bimler, the physician on call, she recommends the patient take tylenol and reassess in the am. If they develop any further symptoms this evening or night, he is to seek assessment at the ED.  Patient understands instructions. He will follow up with this office in the am.

## 2018-02-08 ENCOUNTER — Encounter: Payer: Self-pay | Admitting: Hematology & Oncology

## 2018-02-08 ENCOUNTER — Other Ambulatory Visit: Payer: Self-pay | Admitting: Radiation Therapy

## 2018-02-08 ENCOUNTER — Encounter: Payer: Self-pay | Admitting: Radiation Oncology

## 2018-02-08 ENCOUNTER — Other Ambulatory Visit: Payer: Self-pay | Admitting: Radiation Oncology

## 2018-02-08 DIAGNOSIS — C7949 Secondary malignant neoplasm of other parts of nervous system: Principal | ICD-10-CM

## 2018-02-08 DIAGNOSIS — J328 Other chronic sinusitis: Secondary | ICD-10-CM

## 2018-02-08 DIAGNOSIS — C7931 Secondary malignant neoplasm of brain: Secondary | ICD-10-CM

## 2018-02-08 DIAGNOSIS — C649 Malignant neoplasm of unspecified kidney, except renal pelvis: Secondary | ICD-10-CM

## 2018-02-08 LAB — TSH: TSH: 3.236 u[IU]/mL (ref 0.320–4.118)

## 2018-02-09 ENCOUNTER — Telehealth: Payer: Self-pay | Admitting: *Deleted

## 2018-02-09 NOTE — Telephone Encounter (Signed)
CALLED PATIENT TO INFORM OF APPT. WITH DR. Melony Overly ON 02-15-18- ARRIVAL TIME - 8:45 AM , [address]ESS [address] PHONE NUMBER [phone], LVM FOR A RETURN CALL

## 2018-02-14 ENCOUNTER — Ambulatory Visit: Payer: BLUE CROSS/BLUE SHIELD

## 2018-02-14 ENCOUNTER — Ambulatory Visit: Payer: BLUE CROSS/BLUE SHIELD | Admitting: Radiation Oncology

## 2018-02-20 ENCOUNTER — Ambulatory Visit: Payer: BLUE CROSS/BLUE SHIELD | Admitting: Radiation Oncology

## 2018-02-28 ENCOUNTER — Other Ambulatory Visit: Payer: Self-pay | Admitting: Family

## 2018-02-28 ENCOUNTER — Other Ambulatory Visit: Payer: Self-pay

## 2018-02-28 ENCOUNTER — Inpatient Hospital Stay (HOSPITAL_BASED_OUTPATIENT_CLINIC_OR_DEPARTMENT_OTHER): Payer: BLUE CROSS/BLUE SHIELD | Admitting: Hematology & Oncology

## 2018-02-28 ENCOUNTER — Ambulatory Visit (HOSPITAL_BASED_OUTPATIENT_CLINIC_OR_DEPARTMENT_OTHER)
Admission: RE | Admit: 2018-02-28 | Discharge: 2018-02-28 | Disposition: A | Discharge status: Home / Self Care | Payer: BLUE CROSS/BLUE SHIELD | Source: Ambulatory Visit | Attending: Hematology & Oncology | Admitting: Hematology & Oncology

## 2018-02-28 ENCOUNTER — Encounter: Payer: Self-pay | Admitting: Hematology & Oncology

## 2018-02-28 ENCOUNTER — Inpatient Hospital Stay: Payer: BLUE CROSS/BLUE SHIELD

## 2018-02-28 ENCOUNTER — Other Ambulatory Visit: Payer: Self-pay | Admitting: Hematology & Oncology

## 2018-02-28 VITALS — BP 128/76 | HR 100 | Temp 100.1°F | Resp 20 | Wt 232.0 lb

## 2018-02-28 VITALS — Temp 98.7°F

## 2018-02-28 DIAGNOSIS — C7951 Secondary malignant neoplasm of bone: Secondary | ICD-10-CM | POA: Diagnosis not present

## 2018-02-28 DIAGNOSIS — C641 Malignant neoplasm of right kidney, except renal pelvis: Secondary | ICD-10-CM | POA: Diagnosis present

## 2018-02-28 DIAGNOSIS — I82890 Acute embolism and thrombosis of other specified veins: Secondary | ICD-10-CM

## 2018-02-28 DIAGNOSIS — I82C11 Acute embolism and thrombosis of right internal jugular vein: Secondary | ICD-10-CM | POA: Insufficient documentation

## 2018-02-28 DIAGNOSIS — Z5112 Encounter for antineoplastic immunotherapy: Secondary | ICD-10-CM | POA: Diagnosis not present

## 2018-02-28 DIAGNOSIS — C649 Malignant neoplasm of unspecified kidney, except renal pelvis: Secondary | ICD-10-CM | POA: Diagnosis not present

## 2018-02-28 DIAGNOSIS — C7931 Secondary malignant neoplasm of brain: Secondary | ICD-10-CM | POA: Diagnosis not present

## 2018-02-28 DIAGNOSIS — R221 Localized swelling, mass and lump, neck: Secondary | ICD-10-CM | POA: Diagnosis not present

## 2018-02-28 DIAGNOSIS — E032 Hypothyroidism due to medicaments and other exogenous substances: Secondary | ICD-10-CM

## 2018-02-28 LAB — CBC WITH DIFFERENTIAL (CANCER CENTER ONLY)
BASOS ABS: 0 10*3/uL (ref 0.0–0.1)
BASOS PCT: 1 %
EOS PCT: 2 %
Eosinophils Absolute: 0.2 10*3/uL (ref 0.0–0.5)
HEMATOCRIT: 37.9 % — AB (ref 38.7–49.9)
Hemoglobin: 12.3 g/dL — ABNORMAL LOW (ref 13.0–17.1)
LYMPHS PCT: 28 %
Lymphs Abs: 2.4 10*3/uL (ref 0.9–3.3)
MCH: 29.3 pg (ref 28.0–33.4)
MCHC: 32.5 g/dL (ref 32.0–35.9)
MCV: 90.2 fL (ref 82.0–98.0)
MONO ABS: 1.1 10*3/uL — AB (ref 0.1–0.9)
MONOS PCT: 12 %
Neutro Abs: 5 10*3/uL (ref 1.5–6.5)
Neutrophils Relative %: 57 %
PLATELETS: 352 10*3/uL (ref 145–400)
RBC: 4.2 MIL/uL (ref 4.20–5.70)
RDW: 14.1 % (ref 11.1–15.7)
WBC Count: 8.7 10*3/uL (ref 4.0–10.0)

## 2018-02-28 LAB — CMP (CANCER CENTER ONLY)
ALT: 30 U/L (ref 10–47)
ANION GAP: 4 — AB (ref 5–15)
AST: 28 U/L (ref 11–38)
Albumin: 3.1 g/dL — ABNORMAL LOW (ref 3.5–5.0)
Alkaline Phosphatase: 180 U/L — ABNORMAL HIGH (ref 26–84)
BILIRUBIN TOTAL: 0.7 mg/dL (ref 0.2–1.6)
BUN: 10 mg/dL (ref 7–22)
CALCIUM: 9.1 mg/dL (ref 8.0–10.3)
CHLORIDE: 105 mmol/L (ref 98–108)
CO2: 29 mmol/L (ref 18–33)
Creatinine: 0.6 mg/dL (ref 0.60–1.20)
GLUCOSE: 105 mg/dL (ref 73–118)
Potassium: 4.1 mmol/L (ref 3.3–4.7)
Sodium: 138 mmol/L (ref 128–145)
Total Protein: 7.5 g/dL (ref 6.4–8.1)

## 2018-02-28 LAB — LACTATE DEHYDROGENASE: LDH: 218 U/L (ref 125–245)

## 2018-02-28 LAB — TSH: TSH: 3.534 u[IU]/mL (ref 0.320–4.118)

## 2018-02-28 MED ORDER — SODIUM CHLORIDE 0.9 % IV SOLN
Freq: Once | INTRAVENOUS | Status: AC
  Administered 2018-02-28: 10:00:00 via INTRAVENOUS

## 2018-02-28 MED ORDER — RIVAROXABAN 20 MG PO TABS: 20.0000 mg | ORAL_TABLET | Freq: Every day | ORAL | 3 refills | Status: DC

## 2018-02-28 MED ORDER — HEPARIN SOD (PORK) LOCK FLUSH 100 UNIT/ML IV SOLN
500.0000 [IU] | Freq: Once | INTRAVENOUS | Status: AC | PRN
  Administered 2018-02-28: 500 [IU]
  Filled 2018-02-28: qty 5

## 2018-02-28 MED ORDER — FAMOTIDINE IN NACL 20-0.9 MG/50ML-% IV SOLN
20.0000 mg | Freq: Once | INTRAVENOUS | Status: AC
  Administered 2018-02-28: 20 mg via INTRAVENOUS

## 2018-02-28 MED ORDER — SODIUM CHLORIDE 0.9% FLUSH
10.0000 mL | INTRAVENOUS | Status: DC | PRN
  Administered 2018-02-28: 10 mL
  Filled 2018-02-28: qty 10

## 2018-02-28 MED ORDER — DIPHENHYDRAMINE HCL 50 MG/ML IJ SOLN
INTRAMUSCULAR | Status: AC
  Filled 2018-02-28: qty 1

## 2018-02-28 MED ORDER — SODIUM CHLORIDE 0.9 % IV SOLN
3.2000 mg/kg | Freq: Once | INTRAVENOUS | Status: AC
  Administered 2018-02-28: 340 mg via INTRAVENOUS
  Filled 2018-02-28: qty 24

## 2018-02-28 MED ORDER — SODIUM CHLORIDE 0.9 % IV SOLN
0.9500 mg/kg | Freq: Once | INTRAVENOUS | Status: AC
  Administered 2018-02-28: 100 mg via INTRAVENOUS
  Filled 2018-02-28: qty 20

## 2018-02-28 MED ORDER — DIPHENHYDRAMINE HCL 50 MG/ML IJ SOLN
25.0000 mg | Freq: Once | INTRAMUSCULAR | Status: AC
  Administered 2018-02-28: 25 mg via INTRAVENOUS

## 2018-02-28 MED ORDER — FAMOTIDINE IN NACL 20-0.9 MG/50ML-% IV SOLN
INTRAVENOUS | Status: AC
  Filled 2018-02-28: qty 50

## 2018-02-28 MED ORDER — IPRATROPIUM-ALBUTEROL 0.5-2.5 (3) MG/3ML IN SOLN
3.0000 mL | Freq: Once | RESPIRATORY_TRACT | Status: AC
  Administered 2018-02-28: 3 mL via RESPIRATORY_TRACT

## 2018-02-28 NOTE — Patient Instructions (Signed)

## 2018-02-28 NOTE — Progress Notes (Signed)
Patient received DuoNeb treatment this morning for cough and low grade temperature elevation.  Dr. Marin Olp has assessed the patient and approved treatment for today.

## 2018-02-28 NOTE — Progress Notes (Signed)
Patient's [name] positive for acute occlusive DVT if the right internal jugular vein. He was give a Xarelto starter pack to begin today. All questions were answered and he verbalized understanding.

## 2018-02-28 NOTE — Patient Instructions (Signed)
[facility] Discharge Instructions for Patients Receiving Chemotherapy  Today you received the following chemotherapy agents:  Nivolumab and Ipilimumab  To help prevent nausea and vomiting after your treatment, we encourage you to take your nausea medication as ordered per MD.    If you develop nausea and vomiting that is not controlled by your nausea medication, call the clinic.   BELOW ARE SYMPTOMS THAT SHOULD BE REPORTED IMMEDIATELY:  *FEVER GREATER THAN 100.5 F  *CHILLS WITH OR WITHOUT FEVER  NAUSEA AND VOMITING THAT IS NOT CONTROLLED WITH YOUR NAUSEA MEDICATION  *UNUSUAL SHORTNESS OF BREATH  *UNUSUAL BRUISING OR BLEEDING  TENDERNESS IN MOUTH AND THROAT WITH OR WITHOUT PRESENCE OF ULCERS  *URINARY PROBLEMS  *BOWEL PROBLEMS  UNUSUAL RASH Items with * indicate a potential emergency and should be followed up as soon as possible.  Feel free to call the clinic should you have any questions or concerns. The clinic phone number is [phone].  Please show the CHEMO ALERT CARD at check-in to the Emergency Department and triage nurse.   

## 2018-03-01 ENCOUNTER — Encounter: Payer: Self-pay | Admitting: Hematology & Oncology

## 2018-03-01 NOTE — Progress Notes (Signed)
Hematology and Oncology Follow Up Visit  Alexander Fry [id number] 10-Dec-1960 57 y.o. 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 Lamountain is here today for follow-up.  As always, he is doing pretty well.  He just got back from [place].  He was out there for work.  He is soon to go out to [place].  There is another plant that he has to go to.  He says that there is some swelling in the right side of his neck.  Of course, he has the Port-A-Cath on the right side.  I have to worry about a blood clot in the neck.  We will see about getting a Doppler set up today.  It would not surprise me if he did have a thrombus.  If he does, we will get him on 1 of the new oral anticoagulants.  He is eating okay.  He is not having any diarrhea.  He has had no rashes.  He has had no nausea or vomiting.  He has had no bleeding.  He has had a little bit about temperature.  He has had a little bit of a cough.  I will see about giving him a breathing treatment today.  I am not sure if he needs a chest x-ray.  Oxygen saturation is doing pretty well.    I think that he sees radiation oncology soon.  He does have some brain metastasis.  He has had SRS for these.  He says that the radiation oncologist can also do [place] for a hip lesion.  I would have no problems with this.  Overall, I said his performance status is ECOG 1.    Medications:  Allergies as of 02/28/2018   No Known Allergies     Medication List        Accurate as of 02/28/18 11:59 PM. Always use your most recent  med list.          acetaminophen 325 MG tablet Commonly known as:  TYLENOL Take 650 mg by mouth every 6 (six) hours as needed.   CABOMETYX 40 MG Tabs Generic drug:  cabozantinib S-Malate   cetirizine 10 MG tablet Commonly known as:  ZYRTEC Take 10 mg by mouth daily. He is taking it 3 times daily   fexofenadine 30 MG/5ML suspension Commonly known as:  ALLEGRA Take 30 mg by mouth daily.   fexofenadine 60 MG tablet Commonly known as:  ALLEGRA Take by mouth daily.   guaiFENesin 600 MG 12 hr tablet Commonly known as:  MUCINEX Take by mouth 2 (two) times daily.   lidocaine-prilocaine cream Commonly known as:  EMLA Apply to affected area once   rivaroxaban 20 MG Tabs tablet Commonly known as:  XARELTO Take 1 tablet (20 mg total) by mouth daily with supper. Start taking on:  03/28/2018       Allergies: No Known Allergies  Past Medical History, Surgical history, Social history, and Family History were reviewed and updated.  Review of Systems: Review of Systems  Constitutional: Negative.  HENT: Negative.   Eyes: Negative.   Respiratory: Negative.   Cardiovascular: Negative.   Gastrointestinal: Negative.   Genitourinary: Negative.   Musculoskeletal: Negative.   Skin: Negative.   Neurological: Negative.   Endo/Heme/Allergies: Negative.   Psychiatric/Behavioral: Negative.      Physical Exam:  weight is 232 lb (105.2 kg). His oral temperature is 100.1 F (37.8 C). His blood pressure is 128/76 and his pulse is 100. His respiration is 20 and oxygen saturation is 96%.   Wt Readings from Last 3 Encounters:  02/28/18 232 lb (105.2 kg)  02/07/18 239 lb 6.4 oz (108.6 kg)  01/24/18 237 lb (107.5 kg)    Physical Exam  Constitutional: He is oriented to person, place, and time.  HENT:  Head: Normocephalic and atraumatic.  Mouth/Throat: Oropharynx is clear and moist.  Eyes: Pupils are equal, round, and reactive to light. EOM are normal.  Neck: Normal range of  motion.  There is some swelling on the right side of his neck.  There is some tenderness along the jugular vein.  Cardiovascular: Normal rate, regular rhythm and normal heart sounds.  Pulmonary/Chest: Effort normal and breath sounds normal.  Abdominal: Soft. Bowel sounds are normal.  Musculoskeletal: Normal range of motion. He exhibits no edema, tenderness or deformity.  Lymphadenopathy:    He has no cervical adenopathy.  Neurological: He is alert and oriented to person, place, and time.  Skin: Skin is warm and dry. No rash noted. No erythema.  Psychiatric: He has a normal mood and affect. His behavior is normal. Judgment and thought content normal.  Vitals reviewed.    Lab Results  Component Value Date   WBC 8.7 02/28/2018   HGB 14.0 02/06/2018   HCT 37.9 (L) 02/28/2018   MCV 90.2 02/28/2018   PLT 352 02/28/2018   No results found for: FERRITIN, IRON, TIBC, UIBC, IRONPCTSAT Lab Results  Component Value Date   RBC 4.20 02/28/2018   No results found for: KPAFRELGTCHN, LAMBDASER, KAPLAMBRATIO No results found for: IGGSERUM, IGA, IGMSERUM No results found for: Kathrynn Ducking, MSPIKE, SPEI   Chemistry      Component Value Date/Time   NA 138 02/28/2018 0815   NA 147 (H) 11/11/2017 1337   NA 141 10/13/2017 1118   K 4.1 02/28/2018 0815   K 3.9 11/11/2017 1337   K 3.6 10/13/2017 1118   CL 105 02/28/2018 0815   CL 107 11/11/2017 1337   CO2 29 02/28/2018 0815   CO2 28 11/11/2017 1337   CO2 25 10/13/2017 1118   BUN 10 02/28/2018 0815   BUN 12 11/11/2017 1337   BUN 9.1 10/13/2017 1118   CREATININE 0.60 02/28/2018 0815   CREATININE 0.9 11/11/2017 1337   CREATININE 0.7 10/13/2017 1118      Component Value Date/Time   CALCIUM 9.1 02/28/2018 0815   CALCIUM 9.2 11/11/2017 1337   CALCIUM 9.2 10/13/2017 1118   ALKPHOS 180 (H) 02/28/2018 0815   ALKPHOS 166 (H) 11/11/2017 1337   ALKPHOS 181 (H) 10/13/2017 1118   AST 28 02/28/2018 0815     AST 38 (H) 10/13/2017 1118   ALT 30 02/28/2018 0815   ALT 38 11/11/2017 1337   ALT 34 10/13/2017 1118   BILITOT 0.7 02/28/2018 0815   BILITOT 1.02 10/13/2017 1118      Impression and Plan: Mr. Recinos is a very pleasant 57 yo caucasian gentleman with metastatic renal cell carcinoma.  It is hard to say if he is responding.  It is too early to know.  With immunotherapy, probably is going to take a good 3 or 4 cycles before you know if there is a response.  My biggest concern right now is this right neck swelling and pain.  Again, with the Port-A-Cath on the right side, I have to worry about a thrombus.  We will see what the Doppler shows.  I will plan to get him back to see [name] in another 3 weeks.  We will proceed with his second cycle of treatment today.  We will see how the breathing treatment works.  I spent about 30 minutes with him today.  Over half the time was spent face-to-face. Marland Kitchen    [name], MD 3/27/20197:26 AM

## 2018-03-03 ENCOUNTER — Other Ambulatory Visit: Payer: BLUE CROSS/BLUE SHIELD

## 2018-03-06 ENCOUNTER — Ambulatory Visit: Payer: BLUE CROSS/BLUE SHIELD | Admitting: Radiation Oncology

## 2018-03-06 ENCOUNTER — Ambulatory Visit: Payer: BLUE CROSS/BLUE SHIELD

## 2018-03-20 NOTE — Progress Notes (Addendum)
Has armband been applied?  Yes  Does patient have an allergy to IV contrast dye?: No   Has patient ever received premedication for IV contrast dye?: N/A  Does patient take metformin?: No  If patient does take metformin when was the last dose: N/A  Date of lab work: 03/21/18 BUN: 10 CR: 0.90  IV site: left AC, + blood return   Has IV site been added to flowsheet?  Yes  BP 112/73   Pulse 78   Temp 98 F (36.7 C)   Resp 20   Wt 236 lb 9.6 oz (107.3 kg)   SpO2 100% Comment: room air  BMI 32.09 kg/m

## 2018-03-21 ENCOUNTER — Inpatient Hospital Stay: Payer: BLUE CROSS/BLUE SHIELD

## 2018-03-21 ENCOUNTER — Other Ambulatory Visit: Payer: Self-pay

## 2018-03-21 ENCOUNTER — Inpatient Hospital Stay: Payer: BLUE CROSS/BLUE SHIELD | Attending: Family

## 2018-03-21 ENCOUNTER — Inpatient Hospital Stay (HOSPITAL_BASED_OUTPATIENT_CLINIC_OR_DEPARTMENT_OTHER): Payer: BLUE CROSS/BLUE SHIELD | Admitting: Hematology & Oncology

## 2018-03-21 VITALS — BP 122/74 | HR 80 | Temp 98.2°F | Resp 20

## 2018-03-21 VITALS — Wt 233.5 lb

## 2018-03-21 DIAGNOSIS — C7931 Secondary malignant neoplasm of brain: Secondary | ICD-10-CM | POA: Insufficient documentation

## 2018-03-21 DIAGNOSIS — I82C11 Acute embolism and thrombosis of right internal jugular vein: Secondary | ICD-10-CM

## 2018-03-21 DIAGNOSIS — Z7901 Long term (current) use of anticoagulants: Secondary | ICD-10-CM | POA: Diagnosis not present

## 2018-03-21 DIAGNOSIS — C7951 Secondary malignant neoplasm of bone: Secondary | ICD-10-CM | POA: Diagnosis not present

## 2018-03-21 DIAGNOSIS — C787 Secondary malignant neoplasm of liver and intrahepatic bile duct: Secondary | ICD-10-CM | POA: Diagnosis not present

## 2018-03-21 DIAGNOSIS — Z5112 Encounter for antineoplastic immunotherapy: Secondary | ICD-10-CM | POA: Diagnosis not present

## 2018-03-21 DIAGNOSIS — C641 Malignant neoplasm of right kidney, except renal pelvis: Secondary | ICD-10-CM

## 2018-03-21 DIAGNOSIS — C7800 Secondary malignant neoplasm of unspecified lung: Secondary | ICD-10-CM | POA: Diagnosis not present

## 2018-03-21 DIAGNOSIS — C649 Malignant neoplasm of unspecified kidney, except renal pelvis: Secondary | ICD-10-CM | POA: Insufficient documentation

## 2018-03-21 LAB — CMP (CANCER CENTER ONLY)
ALT: 24 U/L (ref 10–47)
AST: 33 U/L (ref 11–38)
Albumin: 3.2 g/dL — ABNORMAL LOW (ref 3.5–5.0)
Alkaline Phosphatase: 185 U/L — ABNORMAL HIGH (ref 26–84)
Anion gap: 4 — ABNORMAL LOW (ref 5–15)
BUN: 10 mg/dL (ref 7–22)
CO2: 31 mmol/L (ref 18–33)
Calcium: 9.1 mg/dL (ref 8.0–10.3)
Chloride: 106 mmol/L (ref 98–108)
Creatinine: 0.9 mg/dL (ref 0.60–1.20)
Glucose, Bld: 102 mg/dL (ref 73–118)
Potassium: 3.9 mmol/L (ref 3.3–4.7)
Sodium: 141 mmol/L (ref 128–145)
Total Bilirubin: 0.6 mg/dL (ref 0.2–1.6)
Total Protein: 7.8 g/dL (ref 6.4–8.1)

## 2018-03-21 LAB — CBC WITH DIFFERENTIAL (CANCER CENTER ONLY)
Basophils Absolute: 0.1 10*3/uL (ref 0.0–0.1)
Basophils Relative: 1 %
Eosinophils Absolute: 0.2 10*3/uL (ref 0.0–0.5)
Eosinophils Relative: 3 %
HEMATOCRIT: 36.9 % — AB (ref 38.7–49.9)
Hemoglobin: 12.1 g/dL — ABNORMAL LOW (ref 13.0–17.1)
LYMPHS ABS: 2.3 10*3/uL (ref 0.9–3.3)
LYMPHS PCT: 30 %
MCH: 28.4 pg (ref 28.0–33.4)
MCHC: 32.8 g/dL (ref 32.0–35.9)
MCV: 86.6 fL (ref 82.0–98.0)
MONO ABS: 0.9 10*3/uL (ref 0.1–0.9)
MONOS PCT: 12 %
Neutro Abs: 4 10*3/uL (ref 1.5–6.5)
Neutrophils Relative %: 54 %
Platelet Count: 295 10*3/uL (ref 145–400)
RBC: 4.26 MIL/uL (ref 4.20–5.70)
RDW: 14.5 % (ref 11.1–15.7)
WBC Count: 7.4 10*3/uL (ref 4.0–10.0)

## 2018-03-21 LAB — LACTATE DEHYDROGENASE: LDH: 302 U/L — AB (ref 125–245)

## 2018-03-21 MED ORDER — SODIUM CHLORIDE 0.9 % IV SOLN
Freq: Once | INTRAVENOUS | Status: AC
  Administered 2018-03-21: 10:00:00 via INTRAVENOUS

## 2018-03-21 MED ORDER — FAMOTIDINE IN NACL 20-0.9 MG/50ML-% IV SOLN
20.0000 mg | Freq: Once | INTRAVENOUS | Status: AC
  Administered 2018-03-21: 20 mg via INTRAVENOUS

## 2018-03-21 MED ORDER — SODIUM CHLORIDE 0.9 % IV SOLN
0.9500 mg/kg | Freq: Once | INTRAVENOUS | Status: AC
  Administered 2018-03-21: 100 mg via INTRAVENOUS
  Filled 2018-03-21: qty 20

## 2018-03-21 MED ORDER — HEPARIN SOD (PORK) LOCK FLUSH 100 UNIT/ML IV SOLN
500.0000 [IU] | Freq: Once | INTRAVENOUS | Status: AC | PRN
  Administered 2018-03-21: 500 [IU]
  Filled 2018-03-21: qty 5

## 2018-03-21 MED ORDER — DIPHENHYDRAMINE HCL 50 MG/ML IJ SOLN
25.0000 mg | Freq: Once | INTRAMUSCULAR | Status: AC
  Administered 2018-03-21: 25 mg via INTRAVENOUS

## 2018-03-21 MED ORDER — DIPHENHYDRAMINE HCL 50 MG/ML IJ SOLN
INTRAMUSCULAR | Status: AC
  Filled 2018-03-21: qty 1

## 2018-03-21 MED ORDER — SODIUM CHLORIDE 0.9 % IV SOLN
INTRAVENOUS | Status: DC
  Administered 2018-03-21: 10:00:00 via INTRAVENOUS

## 2018-03-21 MED ORDER — FAMOTIDINE IN NACL 20-0.9 MG/50ML-% IV SOLN
INTRAVENOUS | Status: AC
  Filled 2018-03-21: qty 50

## 2018-03-21 MED ORDER — SODIUM CHLORIDE 0.9 % IV SOLN
3.2000 mg/kg | Freq: Once | INTRAVENOUS | Status: AC
  Administered 2018-03-21: 340 mg via INTRAVENOUS
  Filled 2018-03-21: qty 24

## 2018-03-21 MED ORDER — SODIUM CHLORIDE 0.9% FLUSH
10.0000 mL | INTRAVENOUS | Status: DC | PRN
Start: 2018-03-21 — End: 2018-03-21
  Administered 2018-03-21: 10 mL
  Filled 2018-03-21: qty 10

## 2018-03-21 NOTE — Progress Notes (Addendum)
Hematology and Oncology Follow Up Visit  Shin Lamour [id number] October 11, 1961 57 y.o. 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 ABoomeris here today for follow-up.  He now has a thrombus in the right internal jugular vein.  This was found on Doppler.  When he was last year, we did a Doppler because he was complaining of some right neck swelling.  We found a thrombus in the internal jugular vein on the right side.  We put him on Xarelto.  He said that after starting his first dose of Xarelto, the swelling got better and the pain got better.  He is complaining of some pain and numbness on the inner right thigh.  I am not sure why he would have this.  [facility].  It would be interesting to see if this can help.  He is still working.  He is still traveling a lot for his job.   He is exercising.  He is eating well.  He is having no problems with bowels or bladder.  He is having no nausea or vomiting.  He is having no cough or shortness of breath.    Overall, I said his performance status is ECOG 1.    Medications:  Allergies as of 03/21/2018   No Known Allergies     Medication List        Accurate as of 03/21/18  8:58 AM. Always use your most recent med list.          acetaminophen 325 MG tablet Commonly known as:  TYLENOL Take 650 mg by mouth every 6 (six) hours as needed.     cetirizine 10 MG tablet Commonly known as:  ZYRTEC Take 10 mg by mouth daily. He is taking it 3 times daily   fexofenadine 60 MG tablet Commonly known as:  ALLEGRA Take by mouth daily.   guaiFENesin 600 MG 12 hr tablet Commonly known as:  MUCINEX Take by mouth 2 (two) times daily.   lidocaine-prilocaine cream Commonly known as:  EMLA Apply to affected area once   rivaroxaban 20 MG Tabs tablet Commonly known as:  XARELTO Take 1 tablet (20 mg total) by mouth daily with supper. Start taking on:  03/28/2018       Allergies: No Known Allergies  Past Medical History, Surgical history, Social history, and Family History were reviewed and updated.  Review of Systems: Review of Systems  Constitutional: Negative.   HENT: Negative.   Eyes: Negative.   Respiratory: Negative.   Cardiovascular: Negative.   Gastrointestinal: Negative.   Genitourinary: Negative.   Musculoskeletal: Negative.   Skin: Negative.   Neurological: Negative.   Endo/Heme/Allergies: Negative.   Psychiatric/Behavioral: Negative.      Physical Exam:  weight is 233 lb 8 oz (105.9 kg).   Wt Readings from Last 3 Encounters:  03/21/18 233 lb 8 oz (105.9 kg)  02/28/18 232 lb (105.2 kg)  02/07/18 239 lb 6.4 oz (108.6 kg)    Physical Exam  Constitutional: He is oriented to person, place, and time.  HENT:  Head: Normocephalic and atraumatic.  Mouth/Throat: Oropharynx is clear and moist.  Eyes: Pupils are equal, round, and reactive to light. EOM are normal.  Neck: Normal range of motion.  There is some swelling on the right side of his neck.  There is some tenderness along the jugular vein.  Cardiovascular: Normal rate, regular rhythm and normal heart sounds.  Pulmonary/Chest: Effort normal and breath sounds normal.  Abdominal: Soft. Bowel sounds are normal.  Musculoskeletal: Normal range of motion. He exhibits no edema, tenderness or deformity.  Lymphadenopathy:    He has no cervical adenopathy.   Neurological: He is alert and oriented to person, place, and time.  Skin: Skin is warm and dry. No rash noted. No erythema.  Psychiatric: He has a normal mood and affect. His behavior is normal. Judgment and thought content normal.  Vitals reviewed.    Lab Results  Component Value Date   WBC 7.4 03/21/2018   HGB 14.0 02/06/2018   HCT 36.9 (L) 03/21/2018   MCV 86.6 03/21/2018   PLT 295 03/21/2018   No results found for: FERRITIN, IRON, TIBC, UIBC, IRONPCTSAT Lab Results  Component Value Date   RBC 4.26 03/21/2018   No results found for: KPAFRELGTCHN, LAMBDASER, KAPLAMBRATIO No results found for: IGGSERUM, IGA, IGMSERUM No results found for: Odetta Pink, SPEI   Chemistry      Component Value Date/Time   NA 141 03/21/2018 0800   NA 147 (H) 11/11/2017 1337   NA 141 10/13/2017 1118   K 3.9 03/21/2018 0800   K 3.9 11/11/2017 1337   K 3.6 10/13/2017 1118   CL 106 03/21/2018 0800   CL 107 11/11/2017 1337   CO2 31 03/21/2018 0800   CO2 28 11/11/2017 1337   CO2 25 10/13/2017 1118   BUN 10 03/21/2018 0800   BUN 12 11/11/2017 1337   BUN 9.1 10/13/2017 1118   CREATININE 0.90 03/21/2018 0800   CREATININE 0.9 11/11/2017 1337   CREATININE 0.7 10/13/2017 1118      Component Value Date/Time   CALCIUM 9.1 03/21/2018 0800   CALCIUM 9.2 11/11/2017 1337   CALCIUM 9.2 10/13/2017 1118   ALKPHOS 185 (H) 03/21/2018 0800   ALKPHOS 166 (H) 11/11/2017 1337   ALKPHOS 181 (H) 10/13/2017 1118   AST 33 03/21/2018 0800   AST 38 (H) 10/13/2017 1118   ALT 24 03/21/2018 0800   ALT 38 11/11/2017 1337   ALT 34 10/13/2017 1118   BILITOT 0.6 03/21/2018 0800   BILITOT 1.02 10/13/2017 1118      Impression and Plan: Mr. Groesbeck is a very pleasant 57 yo caucasian gentleman with metastatic renal cell carcinoma.  At this point, we  do have to keep in mind about the thrombus in the right internal jugular vein.  I am sure this is a reflection of his  malignancy and also having the Port-A-Cath in.  We will go ahead with his third cycle of treatment.  After this third cycle of treatment, we will proceed with a fourth cycle and then repeat his scans.  Hopefully, we will see that he is responding.  I spent about 30 minutes with him today.  Over half the time was spent face-to-face. Marland Kitchen    [name], MD  4/16/20198:58 AM

## 2018-03-23 ENCOUNTER — Other Ambulatory Visit: Payer: BLUE CROSS/BLUE SHIELD

## 2018-03-27 ENCOUNTER — Ambulatory Visit
Admission: RE | Admit: 2018-03-27 | Discharge: 2018-03-27 | Disposition: A | Discharge status: Home / Self Care | Payer: BLUE CROSS/BLUE SHIELD | Source: Ambulatory Visit | Attending: Radiation Oncology | Admitting: Radiation Oncology

## 2018-03-27 ENCOUNTER — Ambulatory Visit: Payer: BLUE CROSS/BLUE SHIELD | Admitting: Radiation Oncology

## 2018-03-27 ENCOUNTER — Encounter: Payer: Self-pay | Admitting: Radiation Therapy

## 2018-03-27 DIAGNOSIS — C7931 Secondary malignant neoplasm of brain: Secondary | ICD-10-CM

## 2018-03-27 DIAGNOSIS — C7949 Secondary malignant neoplasm of other parts of nervous system: Principal | ICD-10-CM

## 2018-03-27 DIAGNOSIS — C7951 Secondary malignant neoplasm of bone: Secondary | ICD-10-CM

## 2018-03-27 DIAGNOSIS — Z510 Encounter for antineoplastic radiation therapy: Secondary | ICD-10-CM | POA: Insufficient documentation

## 2018-03-27 MED ORDER — GADOBENATE DIMEGLUMINE 529 MG/ML IV SOLN
20.0000 mL | Freq: Once | INTRAVENOUS | Status: AC | PRN
  Administered 2018-03-27: 20 mL via INTRAVENOUS

## 2018-03-28 ENCOUNTER — Encounter: Payer: Self-pay | Admitting: Radiation Oncology

## 2018-03-28 NOTE — Progress Notes (Signed)
Clip from Dr. Pearlie Oyster 02/07/18 Office Visit:   Impression/Plan:  57 y.o. gentleman with brain metastases. I have reviewed his images. He has a new 5 mm metastasis in the medial right cerebellum that we will treat with SRS. He has travel plans over the next 3 weeks, so we will schedule this for after March 26th. We will arrange a brain MRI to precede that appointment - there may be other new lesions by then, possibly, to target.  And, the 36mm new lesion may grow in the interim, warranting another MRI.  I will also make a referral to ENT for evaluation of his sinus symptoms that are persistent despite help from med/onc and his PCP.  We will consider palliative RT to the right ilium upon his return from his travels, as well.  I spent at least 10 minutes face to face with the patient, over 50% of that time on counseling and care coordination.  ___________________________________    ---------------------------------------------   Alexander Fry was scanned on 4/22, as planned. The new MRI demonstrated resolution of the 5 mm Rt Cerebellar lesion. Based on this great news, the [facility] and treatment were cancelled. Dr. Isidore Moos reviewed the imaging and shared the positive results with Alexander Fry. Since we had the [facility] appointment saved for him already, we were able to move forward with simulation of the Rt. Ilium instead. He will have a single fraction to the Rt. Ilium on 04/03/18.   [name] R.T.(R)(T) Special Procedures Navigator

## 2018-03-28 NOTE — Progress Notes (Signed)
Radiation Oncology         ([phone]) [phone] ________________________________  Name: Leighton Brickley MRN: [id number]  Date: 03/27/2018  DOB: 1961/03/13  SIMULATION AND TREATMENT PLANNING NOTE  Outpatient  DIAGNOSIS:     ICD-10-CM   1. Bone metastases ([place]) C79.51     NARRATIVE:  The patient was brought to the [facility].   MRI images and report reviewed by me and discussed w/ Mr Markwood.  He does not need SRS planning as the lesion of concern regressed in the interim.  However, we discussed his persistent right pelvic pain. There is also numbness of the inner right thigh.  These symptoms may be related to nerve pain in the lumbar spine where he has bone damage from prior metastases that are s/p SRS; I do not think more RT would palliate his symptoms if they are spine-based.  However, it is possible the pelvic pain is actually from the right iliac lesion on his PET, lighting up.  I recommend 8Gy in 1 fx to this lesion with palliative intent. He is agreeable to this.    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 Electronically Signed   By: Franchot Gallo M.D.   On: 03/27/2018 09:51   Result Date: 03/27/2018 CLINICAL DATA:  Metastatic renal cell carcinoma with stereotactic radio surgery for metastatic disease to brain. EXAM: MRI HEAD WITHOUT AND WITH CONTRAST TECHNIQUE: Multiplanar, multiecho pulse sequences of the brain and surrounding structures were obtained without and with intravenous contrast. CONTRAST:  45mL MULTIHANCE GADOBENATE DIMEGLUMINE 529 MG/ML IV SOLN COMPARISON:  MRI head 02/03/2018 FINDINGS: Brain: 3 mm enhancing lesion in the right cerebellum has resolved since the prior study. No residual enhancing mass lesion. Stable treated lesions in the right cerebellum, right occipital lobe, and right parietal lobe are stable to improved. Right frontal operculum lesion  shows mild peripheral enhancement appears slightly smaller. This lesion measures 5 x 9 mm No new or enlarging metastatic deposits identified. Ventricle size normal. No acute infarct. 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 Vascular: Normal arterial flow voids Skull and upper cervical spine: Negative for skeletal metastasis. Sinuses/Orbits: Mild mucosal edema paranasal sinuses. Mild mastoid effusion bilaterally. Negative orbit Other: None IMPRESSION: Resolution of right cerebellar metastatic deposit since the prior study. Other treated lesions are stable to improved. No new lesions. Electronically Signed: By: Franchot Gallo M.D. On: 03/27/2018 09:32   Identity was confirmed.  All relevant records and images related to the planned course of therapy were reviewed.  The patient freely provided informed written consent to proceed with treatment after reviewing the details related to the planned course of therapy. The consent form was witnessed and verified by the simulation staff.    Then, the patient was set-up in a stable reproducible  supine position for radiation therapy.  CT images were obtained.  Surface markings were placed.  The CT images were loaded into the planning software.    TREATMENT PLANNING NOTE: Treatment planning then occurred.  The radiation prescription was entered and confirmed.    A total of 3 medically necessary complex treatment devices were fabricated and supervised by me, in the form of vaclock and 2 fields with MLCs to block bowel and bladder and recturm. MORE FIELDS WITH MLCs MAY BE ADDED IN DOSIMETRY for dose  homogeneity.  I have requested : Isodose Plan.    The patient will receive 8 Gy in 1 fraction to the right iliac lesion.  -----------------------------------  [name], MD

## 2018-03-29 DIAGNOSIS — C7951 Secondary malignant neoplasm of bone: Secondary | ICD-10-CM | POA: Diagnosis not present

## 2018-03-30 ENCOUNTER — Encounter: Payer: Self-pay | Admitting: Hematology & Oncology

## 2018-03-31 ENCOUNTER — Inpatient Hospital Stay (HOSPITAL_BASED_OUTPATIENT_CLINIC_OR_DEPARTMENT_OTHER): Payer: BLUE CROSS/BLUE SHIELD | Admitting: Family

## 2018-03-31 ENCOUNTER — Telehealth: Payer: Self-pay | Admitting: Family

## 2018-03-31 ENCOUNTER — Other Ambulatory Visit: Payer: Self-pay

## 2018-03-31 ENCOUNTER — Encounter (HOSPITAL_BASED_OUTPATIENT_CLINIC_OR_DEPARTMENT_OTHER): Payer: Self-pay

## 2018-03-31 ENCOUNTER — Ambulatory Visit (HOSPITAL_BASED_OUTPATIENT_CLINIC_OR_DEPARTMENT_OTHER)
Admission: RE | Admit: 2018-03-31 | Discharge: 2018-03-31 | Disposition: A | Discharge status: Home / Self Care | Payer: BLUE CROSS/BLUE SHIELD | Source: Ambulatory Visit | Attending: Family | Admitting: Family

## 2018-03-31 ENCOUNTER — Encounter: Payer: Self-pay | Admitting: Family

## 2018-03-31 ENCOUNTER — Ambulatory Visit: Payer: BLUE CROSS/BLUE SHIELD | Admitting: Radiation Oncology

## 2018-03-31 VITALS — BP 124/66 | HR 85 | Temp 98.3°F | Resp 19 | Wt 225.0 lb

## 2018-03-31 DIAGNOSIS — Z5112 Encounter for antineoplastic immunotherapy: Secondary | ICD-10-CM | POA: Diagnosis not present

## 2018-03-31 DIAGNOSIS — C641 Malignant neoplasm of right kidney, except renal pelvis: Secondary | ICD-10-CM | POA: Insufficient documentation

## 2018-03-31 DIAGNOSIS — C7931 Secondary malignant neoplasm of brain: Secondary | ICD-10-CM

## 2018-03-31 DIAGNOSIS — Z7901 Long term (current) use of anticoagulants: Secondary | ICD-10-CM

## 2018-03-31 DIAGNOSIS — C7951 Secondary malignant neoplasm of bone: Secondary | ICD-10-CM

## 2018-03-31 DIAGNOSIS — I82C21 Chronic embolism and thrombosis of right internal jugular vein: Secondary | ICD-10-CM | POA: Diagnosis not present

## 2018-03-31 DIAGNOSIS — C7800 Secondary malignant neoplasm of unspecified lung: Secondary | ICD-10-CM | POA: Diagnosis not present

## 2018-03-31 DIAGNOSIS — C649 Malignant neoplasm of unspecified kidney, except renal pelvis: Secondary | ICD-10-CM | POA: Diagnosis not present

## 2018-03-31 DIAGNOSIS — I82890 Acute embolism and thrombosis of other specified veins: Secondary | ICD-10-CM

## 2018-03-31 DIAGNOSIS — E032 Hypothyroidism due to medicaments and other exogenous substances: Secondary | ICD-10-CM

## 2018-03-31 DIAGNOSIS — C787 Secondary malignant neoplasm of liver and intrahepatic bile duct: Secondary | ICD-10-CM | POA: Diagnosis not present

## 2018-03-31 DIAGNOSIS — I82C11 Acute embolism and thrombosis of right internal jugular vein: Secondary | ICD-10-CM

## 2018-03-31 NOTE — Telephone Encounter (Signed)
I spoke with Alexander Fry and went over his [name] results from today. 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 He will continue on Xarelto 20 mg PO daily. No new intervention needed at this time.

## 2018-03-31 NOTE — Progress Notes (Signed)
Hematology and Oncology Follow Up Visit  Alexander Fry [id number] [birth year] 57 y.o. 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 He does have a history of right IJ thrombus.  There is no visible swelling, no redness or edema. No JVD.  He did mention that he push mowed his lawn 2 days ago and then the pain started.  He is taking Xarelto 20 mg PO daily as prescribed.  No fever, chills, n/v, cough, rash, dizziness, SOB, chest pain, palpitations, abdominal pain or changes in bowel or bladder habits.  No lymphadenopathy noted on exam.  He has the same numbness along the right thigh. This is unchanged. No swelling or tingling in his extremities.  He has a good appetite and is staying well hydrated.   ECOG Performance Status: 1 - Symptomatic but completely ambulatory  Medications:  Allergies as of 03/31/2018   No Known Allergies     Medication List        Accurate as of 03/31/18  3:12 PM. Always use your most recent med list.          acetaminophen 325 MG tablet Commonly known as:  TYLENOL Take 650 mg by mouth every 6 (six) hours as needed.   fexofenadine 60 MG tablet Commonly known as:  ALLEGRA Take by mouth daily.   lidocaine-prilocaine cream Commonly known as:  EMLA Apply to affected area once   rivaroxaban 20 MG Tabs tablet Commonly known  as:  XARELTO Take 1 tablet (20 mg total) by mouth daily with supper.       Allergies: No Known Allergies  Past Medical History, Surgical history, Social history, and Family History were reviewed and updated.  Review of Systems: All other 10 point review of systems is negative.   Physical Exam:  weight is 225 lb (102.1 kg). His oral temperature is 98.3 F (36.8 C). His blood pressure is 124/66 and his pulse is 85. His respiration is 19 and oxygen saturation is 100%.   Wt Readings from Last 3 Encounters:  03/31/18 225 lb (102.1 kg)  03/27/18 236 lb 9.6 oz (107.3 kg)  03/21/18 233 lb 8 oz (105.9 kg)    Ocular: Sclerae unicteric, pupils equal, round and reactive to light Ear-nose-throat: Oropharynx clear, dentition fair Lymphatic: No cervical, supraclavicular or axillary adenopathy Lungs no rales or rhonchi, good excursion bilaterally Heart regular rate and rhythm, no murmur appreciated Abd soft, nontender, positive bowel sounds, no liver or spleen tip palpated on exam, no fluid wave  MSK no focal spinal tenderness, no joint edema Neuro: non-focal, well-oriented, appropriate affect Breasts: Deferred   Lab Results  Component Value Date   WBC 7.4 03/21/2018   HGB 12.1 (L) 03/21/2018   HCT 36.9 (L) 03/21/2018   MCV 86.6 03/21/2018   PLT 295 03/21/2018   No results found for: FERRITIN, IRON, TIBC,  UIBC, IRONPCTSAT Lab Results  Component Value Date   RBC 4.26 03/21/2018   No results found for: KPAFRELGTCHN, LAMBDASER, KAPLAMBRATIO No results found for: IGGSERUM, IGA, IGMSERUM No results found for: Odetta Pink, SPEI   Chemistry      Component Value Date/Time   NA 141 03/21/2018 0800   NA 147 (H) 11/11/2017 1337   NA 141 10/13/2017 1118   K 3.9 03/21/2018 0800   K 3.9 11/11/2017 1337   K 3.6 10/13/2017 1118   CL 106 03/21/2018 0800   CL 107 11/11/2017 1337   CO2 31 03/21/2018 0800   CO2 28 11/11/2017 1337   CO2  25 10/13/2017 1118   BUN 10 03/21/2018 0800   BUN 12 11/11/2017 1337   BUN 9.1 10/13/2017 1118   CREATININE 0.90 03/21/2018 0800   CREATININE 0.9 11/11/2017 1337   CREATININE 0.7 10/13/2017 1118      Component Value Date/Time   CALCIUM 9.1 03/21/2018 0800   CALCIUM 9.2 11/11/2017 1337   CALCIUM 9.2 10/13/2017 1118   ALKPHOS 185 (H) 03/21/2018 0800   ALKPHOS 166 (H) 11/11/2017 1337   ALKPHOS 181 (H) 10/13/2017 1118   AST 33 03/21/2018 0800   AST 38 (H) 10/13/2017 1118   ALT 24 03/21/2018 0800   ALT 38 11/11/2017 1337   ALT 34 10/13/2017 1118   BILITOT 0.6 03/21/2018 0800   BILITOT 1.02 10/13/2017 1118      Impression and Plan: Alexander Fry is a very pleasant 57 yo caucasian gentleman with metastatic renal cell carcinoma with extensive metastasis. He is here with complaint of tenderness and "pulling" near his port site. 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  He is taking Xarelto 20 mg PO daily as prescribed.  He did mow his lawn the day the discomfort started. We will get an US of the neck and arm this evening to rule out recurrent clot.  He has his current schedule and we will plan to see him back on 04/11/18.  He will contact our office with any questions or concerns. We can certainly see him sooner if needed.   Laverna Peace, NP 4/26/20193:12 PM

## 2018-04-03 ENCOUNTER — Ambulatory Visit (INDEPENDENT_AMBULATORY_CARE_PROVIDER_SITE_OTHER): Payer: BLUE CROSS/BLUE SHIELD | Admitting: Physician Assistant

## 2018-04-03 ENCOUNTER — Ambulatory Visit (INDEPENDENT_AMBULATORY_CARE_PROVIDER_SITE_OTHER): Payer: BLUE CROSS/BLUE SHIELD

## 2018-04-03 ENCOUNTER — Encounter: Payer: Self-pay | Admitting: Physician Assistant

## 2018-04-03 ENCOUNTER — Ambulatory Visit
Admission: RE | Admit: 2018-04-03 | Discharge: 2018-04-03 | Disposition: A | Discharge status: Home / Self Care | Payer: BLUE CROSS/BLUE SHIELD | Source: Ambulatory Visit | Attending: Radiation Oncology | Admitting: Radiation Oncology

## 2018-04-03 VITALS — BP 123/77 | HR 83 | Wt 238.0 lb

## 2018-04-03 DIAGNOSIS — C7951 Secondary malignant neoplasm of bone: Secondary | ICD-10-CM

## 2018-04-03 DIAGNOSIS — G479 Sleep disorder, unspecified: Secondary | ICD-10-CM | POA: Diagnosis not present

## 2018-04-03 DIAGNOSIS — N401 Enlarged prostate with lower urinary tract symptoms: Secondary | ICD-10-CM

## 2018-04-03 DIAGNOSIS — M1710 Unilateral primary osteoarthritis, unspecified knee: Secondary | ICD-10-CM | POA: Diagnosis not present

## 2018-04-03 DIAGNOSIS — R351 Nocturia: Secondary | ICD-10-CM | POA: Diagnosis not present

## 2018-04-03 DIAGNOSIS — M170 Bilateral primary osteoarthritis of knee: Secondary | ICD-10-CM

## 2018-04-03 HISTORY — DX: Bilateral primary osteoarthritis of knee: M17.0

## 2018-04-03 MED ORDER — TRAMADOL HCL 50 MG PO TABS: 50.0000 mg | ORAL_TABLET | Freq: Three times a day (TID) | ORAL | 0 refills | Status: DC | PRN

## 2018-04-03 MED ORDER — FINASTERIDE 5 MG PO TABS: 5.0000 mg | ORAL_TABLET | Freq: Every day | ORAL | 3 refills | Status: DC

## 2018-04-03 MED ORDER — ACETAMINOPHEN ER 650 MG PO TBCR
1300.0000 mg | EXTENDED_RELEASE_TABLET | Freq: Three times a day (TID) | ORAL | 3 refills | Status: DC | PRN
Start: 2018-04-03 — End: 2018-08-04

## 2018-04-03 NOTE — Progress Notes (Signed)
Hi Alexander Fry seeing you today!  Your x-rays do show mild arthritis in both knees. Treatment plan does not change. Avoid NSAIDs (Ibuprofen, Aleve, etc.) since you are taking Xarelto. Take Tylenol Arthritis pain and Tramadol as needed. Home rehab exercises. Follow-up with Dr. Darene Lamer if no improvement in 4 weeks.  Best, Evlyn Clines

## 2018-04-03 NOTE — Progress Notes (Signed)
HPI:                                                                Alexander Fry is a 57 y.o. male who presents to [facility]: [facility] today for knee pain, nocturia and sleep difficulties  This is a pleasant 57 yo M with metastatic renal cell carcinoma, right IJ thrombus, currently on immunotherapy (Nivolumba/Ipilimumab) and Xarelta 20 mg.  Knee pain: reports worsening bilateral knee pain that he attributes to his immunotherapy. States it was worse after his last treatment on 03/31/18. Pain is moderate, persistent, in the medial and anterior knees. Not currently taking any medication for pain. 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  Sleep difficulty:  complains of multiple nighttime awakenings. Sleeps for approximately 2 hours, before waking up, able to fall back asleep. Sometimes he is awoken with urinary urgency 1-4 times nightly. No difficulty falling asleep. Usual bedtime 9:30-10pm, Wakes at 6:10 am. Reports he used to sleep 9-10 hours prior to chemotherapy.  BPH: mainly c/o nocturia 1-4 times per night. He had orthostasis and gait imbalance with Flomax. Not currently on any medication for LUTS.  IPSS Questionnaire (AUA-7): Over the past month.   1)  How often have you had a sensation of not emptying your bladder completely after you finish urinating?  1 - Less than 1 time in 5  2)  How often have you had to urinate again less than two hours after you finished urinating? 5 - Almost always  3)  How often have you found you stopped and started again several times when you urinated?  1 - Less than 1 time in 5  4) How difficult have you found it to postpone urination?  1 - Less than 1 time in 5  5) How often have you had a weak urinary stream?  1 - Less than 1 time in 5  6) How often have you had to push or strain to begin urination?  0 - Not at all  7) How many times did you most typically get up to urinate from the time you went to bed until the  time you got up in the morning?  3 - 3 times  Total score:  0-7 mildly symptomatic   8-19 moderately symptomatic   20-35 severely symptomatic     Depression screen [facility] 2/9 02/07/2018 12/23/2017 07/12/2017 06/03/2017 04/08/2017  Decreased Interest 0 0 0 0 0  Down, Depressed, Hopeless 0 0 0 0 0  PHQ - 2 Score 0 0 0 0 0    No flowsheet data found.    Past Medical History:  Diagnosis Date  . Bilateral primary osteoarthritis of knee 04/03/2018  . Goals of care, counseling/discussion 12/22/2016  . History of radiation therapy 02/11/2017   SRT right posterior frontal 12 mm target 20 Gy, Right anterior frontal 81mm 20 Gy  . History of radiation therapy 02/21/2017   SRT Right post parietal 34mm target 20 Gy, left Post parietal 65mm 20 Gy, Right Occipital 4 mm 20Gy, Left Occipital 4 mm 20 Gy  . History of radiation therapy 02/25/2017, 02/28/2017, 03/02/2017   SRT L1 spine 27 Gy 3 fractions, L4 Spine 27 Gy 3 fractions  . History of radiation therapy 06/07/2017  SRS- Brain  . Inguinal hernia of left side without obstruction or gangrene   . Pneumonia    left lung  . Renal cell carcinoma, right ([place]) 01/05/2017  . 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 His vision has "spots" at times   Past Surgical History:  Procedure Laterality Date  . HERNIA REPAIR    . INGUINAL HERNIA REPAIR Left   . IR FLUORO GUIDE PORT INSERTION RIGHT  02/06/2018  . IR US GUIDE VASC ACCESS RIGHT  02/06/2018   Social History   Tobacco Use  . Smoking status: Never Smoker  . Smokeless tobacco: Never Used  Substance Use Topics  . Alcohol use: Yes    Comment: social use in the past   family history includes Alcohol abuse in his brother; Diabetes in his mother; Heart disease in his father and mother.    ROS: negative except as noted in the HPI  Medications: Current Outpatient Medications  Medication Sig Dispense Refill  . acetaminophen (TYLENOL) 650 MG CR tablet Take 2 tablets (1,300 mg total) by mouth  every 8 (eight) hours as needed for pain. 90 tablet 3  . fexofenadine (ALLEGRA) 60 MG tablet Take by mouth daily.    . finasteride (PROSCAR) 5 MG tablet Take 1 tablet (5 mg total) by mouth daily. 90 tablet 3  . lidocaine-prilocaine (EMLA) cream Apply to affected area once 30 g 3  . rivaroxaban (XARELTO) 20 MG TABS tablet Take 1 tablet (20 mg total) by mouth daily with supper. 30 tablet 3  . traMADol (ULTRAM) 50 MG tablet Take 1 tablet (50 mg total) by mouth every 8 (eight) hours as needed. 30 tablet 0   No current facility-administered medications for this visit.    No Known Allergies     Objective:  BP 123/77   Pulse 83   Wt 238 lb (108 kg)   BMI 32.28 kg/m  Gen:  alert, not ill-appearing, no distress, appropriate for age, obese male HEENT: head normocephalic without obvious abnormality, conjunctiva and cornea clear, wearing glasses, trachea midline Pulm: Normal work of breathing, normal phonation Neuro: alert and oriented x 3, no tremor MSK: extremities atraumatic, normal gait and station Skin: intact, no rashes on exposed skin, no jaundice, no cyanosis Psych: well-groomed, cooperative, good eye contact, euthymic mood, affect mood-congruent, speech is articulate, and thought processes clear and goal-directed   Lab Results  Component Value Date   ALT 24 03/21/2018   AST 33 03/21/2018   ALKPHOS 185 (H) 03/21/2018   BILITOT 0.6 03/21/2018   Lab Results  Component Value Date   WBC 7.4 03/21/2018   HGB 12.1 (L) 03/21/2018   HCT 36.9 (L) 03/21/2018   MCV 86.6 03/21/2018   PLT 295 03/21/2018    No results found for this or any previous visit (from the past 72 hour(s)). Dg Knee Complete 4 Views Left  Result Date: 04/03/2018 CLINICAL DATA:  Bilateral knee arthropathy.  Knee pain bilaterally EXAM: LEFT KNEE - COMPLETE 4+ VIEW COMPARISON:  None. FINDINGS: Right knee: Mild joint space narrowing medially with minimal spurring. Small joint effusion. Negative for fracture Left  knee: Mild joint space narrowing and spurring medially. Mild patellofemoral degenerative change. Negative for fracture. Small joint effusion. IMPRESSION: Mild joint space narrowing in both knees compatible with degenerative change with small effusions. No acute skeletal abnormality. Electronically Signed   By: Franchot Gallo M.D.   On: 04/03/2018 15:56   Dg Knee Complete 4 Views Right  Result Date: 04/03/2018 CLINICAL DATA:  Bilateral knee arthropathy.  Knee pain EXAM: RIGHT KNEE - COMPLETE 4+ VIEW COMPARISON:  None. FINDINGS: Mild joint space Narrowing and spurring in the medial joint compartment. Small joint effusion. Negative for fracture. Mild patellofemoral IMPRESSION: Mild degenerative change right knee Electronically Signed   By: Franchot Gallo M.D.   On: 04/03/2018 16:01      Assessment and Plan: 57 y.o. male with   Benign prostatic hyperplasia with nocturia - Plan: finasteride (PROSCAR) 5 MG tablet  Sleep disturbance  Bone metastases (HCC) - Plan: traMADol (ULTRAM) 50 MG tablet  Knee arthropathy - Plan: traMADol (ULTRAM) 50 MG tablet, acetaminophen (TYLENOL) 650 MG CR tablet, DG Knee Complete 4 Views Left, DG Knee Complete 4 Views Right   Sleep disturbance - likely secondary to chronic pain from OA and mets as well as nocturia. Treating underlying causes as outlined below. Avoiding any overly-sedating medications that could increase risk of falls since he is taking blood thinners  BPH AUASS 12, moderately symptomatic Intolerant to Flomax (orthostasis). Starting Finasteride. Counseled that symptoms may take 6 months to improve  Knee arthropathy - extra strength tylenol ER 1300 mg every 8 hours as needed. Transaminases are now in range and he is not on any hepatotoxic medications - tramadol 50-100 mg QHS prn for pain - X-rays  - knee rehab/strengthening exercises - follow-up with Sports Medicine in 4-6 weeks  Patient education and anticipatory guidance given Patient agrees  with treatment plan Follow-up in 6 months or sooner as needed if symptoms worsen or fail to improve  Darlyne Russian PA-C

## 2018-04-04 ENCOUNTER — Ambulatory Visit: Payer: BLUE CROSS/BLUE SHIELD

## 2018-04-05 ENCOUNTER — Encounter: Payer: Self-pay | Admitting: Radiation Oncology

## 2018-04-05 ENCOUNTER — Ambulatory Visit: Payer: BLUE CROSS/BLUE SHIELD

## 2018-04-05 NOTE — Progress Notes (Signed)
  Radiation Oncology         [phone] ________________________________  Name: Alexander Fry MRN: [id number]  Date: 04/05/2018  DOB: June 26, 1961  End of Treatment Note  Diagnosis:   Renal cell carcinoma with bone metastasis.     Indication for treatment:  Palliative       Radiation treatment dates:   04/03/2018  Site/dose:   Right ilium, 8 Gy in 1 fraction for a total dose of 8 Gy  Beams/energy:   Isodose plan, 10X  Narrative: The patient tolerated radiation treatment relatively well. Patient reported right upper leg pain following his first and final treatment, otherwise he was without complaints.   Plan: The patient has completed radiation treatment. The patient will return to radiation oncology clinic for routine followup in one month. I advised them to call or return sooner if they have any questions or concerns related to their recovery or treatment.  -----------------------------------  [name], MD  This document serves as a record of services personally performed by [name], MD. It was created on her behalf by Steva Colder, a trained medical scribe. The creation of this record is based on the scribe's personal observations and the provider's statements to them. This document has been checked and approved by the attending provider.

## 2018-04-06 ENCOUNTER — Encounter: Payer: Self-pay | Admitting: Physician Assistant

## 2018-04-06 ENCOUNTER — Ambulatory Visit: Payer: BLUE CROSS/BLUE SHIELD

## 2018-04-06 DIAGNOSIS — G479 Sleep disorder, unspecified: Secondary | ICD-10-CM | POA: Insufficient documentation

## 2018-04-07 ENCOUNTER — Ambulatory Visit: Payer: BLUE CROSS/BLUE SHIELD

## 2018-04-10 ENCOUNTER — Ambulatory Visit: Payer: BLUE CROSS/BLUE SHIELD

## 2018-04-11 ENCOUNTER — Inpatient Hospital Stay: Payer: BLUE CROSS/BLUE SHIELD

## 2018-04-11 ENCOUNTER — Ambulatory Visit: Payer: BLUE CROSS/BLUE SHIELD

## 2018-04-11 ENCOUNTER — Inpatient Hospital Stay: Payer: BLUE CROSS/BLUE SHIELD | Attending: Family | Admitting: Hematology & Oncology

## 2018-04-11 VITALS — BP 119/74 | HR 79 | Temp 98.6°F | Resp 18

## 2018-04-11 DIAGNOSIS — Z5112 Encounter for antineoplastic immunotherapy: Secondary | ICD-10-CM | POA: Insufficient documentation

## 2018-04-11 DIAGNOSIS — C787 Secondary malignant neoplasm of liver and intrahepatic bile duct: Secondary | ICD-10-CM | POA: Diagnosis not present

## 2018-04-11 DIAGNOSIS — C641 Malignant neoplasm of right kidney, except renal pelvis: Secondary | ICD-10-CM

## 2018-04-11 DIAGNOSIS — C649 Malignant neoplasm of unspecified kidney, except renal pelvis: Secondary | ICD-10-CM | POA: Diagnosis present

## 2018-04-11 DIAGNOSIS — I82C11 Acute embolism and thrombosis of right internal jugular vein: Secondary | ICD-10-CM

## 2018-04-11 DIAGNOSIS — C7951 Secondary malignant neoplasm of bone: Secondary | ICD-10-CM | POA: Diagnosis not present

## 2018-04-11 DIAGNOSIS — Z7901 Long term (current) use of anticoagulants: Secondary | ICD-10-CM | POA: Diagnosis not present

## 2018-04-11 DIAGNOSIS — C7931 Secondary malignant neoplasm of brain: Secondary | ICD-10-CM

## 2018-04-11 DIAGNOSIS — E032 Hypothyroidism due to medicaments and other exogenous substances: Secondary | ICD-10-CM

## 2018-04-11 LAB — CBC WITH DIFFERENTIAL (CANCER CENTER ONLY)
Basophils Absolute: 0 10*3/uL (ref 0.0–0.1)
Basophils Relative: 0 %
EOS PCT: 2 %
Eosinophils Absolute: 0.1 10*3/uL (ref 0.0–0.5)
HCT: 36 % — ABNORMAL LOW (ref 38.7–49.9)
HEMOGLOBIN: 11.6 g/dL — AB (ref 13.0–17.1)
LYMPHS ABS: 1.4 10*3/uL (ref 0.9–3.3)
LYMPHS PCT: 22 %
MCH: 27.6 pg — AB (ref 28.0–33.4)
MCHC: 32.2 g/dL (ref 32.0–35.9)
MCV: 85.5 fL (ref 82.0–98.0)
Monocytes Absolute: 0.5 10*3/uL (ref 0.1–0.9)
Monocytes Relative: 9 %
Neutro Abs: 4.3 10*3/uL (ref 1.5–6.5)
Neutrophils Relative %: 67 %
Platelet Count: 285 10*3/uL (ref 145–400)
RBC: 4.21 MIL/uL (ref 4.20–5.70)
RDW: 14.7 % (ref 11.1–15.7)
WBC: 6.4 10*3/uL (ref 4.0–10.0)

## 2018-04-11 LAB — CMP (CANCER CENTER ONLY)
ALT: 29 U/L (ref 10–47)
ANION GAP: 5 (ref 5–15)
AST: 31 U/L (ref 11–38)
Albumin: 3.1 g/dL — ABNORMAL LOW (ref 3.5–5.0)
Alkaline Phosphatase: 202 U/L — ABNORMAL HIGH (ref 26–84)
BUN: 10 mg/dL (ref 7–22)
CO2: 29 mmol/L (ref 18–33)
CREATININE: 0.9 mg/dL (ref 0.60–1.20)
Calcium: 9.4 mg/dL (ref 8.0–10.3)
Chloride: 105 mmol/L (ref 98–108)
Glucose, Bld: 157 mg/dL — ABNORMAL HIGH (ref 73–118)
POTASSIUM: 4.1 mmol/L (ref 3.3–4.7)
Sodium: 139 mmol/L (ref 128–145)
Total Bilirubin: 0.7 mg/dL (ref 0.2–1.6)
Total Protein: 7.8 g/dL (ref 6.4–8.1)

## 2018-04-11 LAB — LACTATE DEHYDROGENASE: LDH: 343 U/L — ABNORMAL HIGH (ref 125–245)

## 2018-04-11 LAB — TSH: TSH: 3.171 u[IU]/mL (ref 0.320–4.118)

## 2018-04-11 MED ORDER — DIPHENHYDRAMINE HCL 50 MG/ML IJ SOLN
INTRAMUSCULAR | Status: AC
  Filled 2018-04-11: qty 1

## 2018-04-11 MED ORDER — SODIUM CHLORIDE 0.9% FLUSH
10.0000 mL | INTRAVENOUS | Status: DC | PRN
  Administered 2018-04-11: 10 mL
  Filled 2018-04-11: qty 10

## 2018-04-11 MED ORDER — HEPARIN SOD (PORK) LOCK FLUSH 100 UNIT/ML IV SOLN
500.0000 [IU] | Freq: Once | INTRAVENOUS | Status: AC | PRN
  Administered 2018-04-11: 500 [IU]
  Filled 2018-04-11: qty 5

## 2018-04-11 MED ORDER — SODIUM CHLORIDE 0.9 % IV SOLN
3.2000 mg/kg | Freq: Once | INTRAVENOUS | Status: AC
  Administered 2018-04-11: 340 mg via INTRAVENOUS
  Filled 2018-04-11: qty 24

## 2018-04-11 MED ORDER — IPILIMUMAB CHEMO INJECTION 200 MG/40ML
0.9500 mg/kg | Freq: Once | INTRAVENOUS | Status: AC
  Administered 2018-04-11: 100 mg via INTRAVENOUS
  Filled 2018-04-11: qty 20

## 2018-04-11 MED ORDER — FAMOTIDINE IN NACL 20-0.9 MG/50ML-% IV SOLN
INTRAVENOUS | Status: AC
  Filled 2018-04-11: qty 50

## 2018-04-11 MED ORDER — DIPHENHYDRAMINE HCL 50 MG/ML IJ SOLN
25.0000 mg | Freq: Once | INTRAMUSCULAR | Status: AC
  Administered 2018-04-11: 25 mg via INTRAVENOUS

## 2018-04-11 MED ORDER — SODIUM CHLORIDE 0.9 % IV SOLN
Freq: Once | INTRAVENOUS | Status: AC
  Administered 2018-04-11: 10:00:00 via INTRAVENOUS

## 2018-04-11 MED ORDER — FAMOTIDINE IN NACL 20-0.9 MG/50ML-% IV SOLN
20.0000 mg | Freq: Once | INTRAVENOUS | Status: AC
  Administered 2018-04-11: 20 mg via INTRAVENOUS

## 2018-04-11 NOTE — Patient Instructions (Signed)
[facility] Discharge Instructions for Patients Receiving Chemotherapy  Today you received the following chemotherapy agents:  Nivolumab and Yervoy  To help prevent nausea and vomiting after your treatment, we encourage you to take your nausea medication as ordered per MD.    If you develop nausea and vomiting that is not controlled by your nausea medication, call the clinic.   BELOW ARE SYMPTOMS THAT SHOULD BE REPORTED IMMEDIATELY:  *FEVER GREATER THAN 100.5 F  *CHILLS WITH OR WITHOUT FEVER  NAUSEA AND VOMITING THAT IS NOT CONTROLLED WITH YOUR NAUSEA MEDICATION  *UNUSUAL SHORTNESS OF BREATH  *UNUSUAL BRUISING OR BLEEDING  TENDERNESS IN MOUTH AND THROAT WITH OR WITHOUT PRESENCE OF ULCERS  *URINARY PROBLEMS  *BOWEL PROBLEMS  UNUSUAL RASH Items with * indicate a potential emergency and should be followed up as soon as possible.  Feel free to call the clinic should you have any questions or concerns. The clinic phone number is [phone].  Please show the [facility] at check-in to the Emergency Department and triage nurse.

## 2018-04-11 NOTE — Progress Notes (Signed)
Hematology and Oncology Follow Up Visit  Alexander Fry [id number] Dec 14, 1960 57 y.o. 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  He seems to be doing quite well.  He got some good news about his brain mets.  An MRI of the brain recently.  It appears that the metastasis that he had appear to be improved.  I think this is all related to the immunotherapy.  The Ipilimumab does seem to have fairly good penetration into the brain.  He is still working.  He may go up to [place] this weekend for Mother's Day.  He has had no problems with cough.  He  has had no shortness of breath.  He has had no nausea or vomiting.  There has been no diarrhea.   He is complaining of some pain in his knees.  This might be from the immunotherapy.  This might also be from just wear and tear.  Overall, his performance status is ECOG 1.  Medications:  Allergies as of 04/11/2018   No Known Allergies     Medication List        Accurate as of 04/11/18  8:47 AM. Always use your most recent med list.          acetaminophen 650 MG CR tablet Commonly known as:  TYLENOL Take 2 tablets (1,300 mg total) by mouth every 8 (eight) hours as needed for pain.   fexofenadine 60 MG tablet Commonly known as:  ALLEGRA Take by mouth daily.   finasteride 5 MG tablet Commonly known as:  PROSCAR Take 1 tablet (5 mg total) by mouth daily.   lidocaine-prilocaine cream Commonly  known as:  EMLA Apply to affected area once   rivaroxaban 20 MG Tabs tablet Commonly known as:  XARELTO Take 1 tablet (20 mg total) by mouth daily with supper.   traMADol 50 MG tablet Commonly known as:  ULTRAM Take 1 tablet (50 mg total) by mouth every 8 (eight) hours as needed.       Allergies: No Known Allergies  Past Medical History, Surgical history, Social history, and Family History were reviewed and updated.  Review of Systems: Review of Systems  Constitutional: Negative.   HENT: Negative.   Eyes: Negative.   Respiratory: Negative.   Cardiovascular: Negative.   Gastrointestinal: Negative.   Genitourinary: Negative.   Musculoskeletal: Positive for joint pain.  Skin: Negative.   Neurological: Negative.   Endo/Heme/Allergies: Negative.   Psychiatric/Behavioral: Negative.      Physical Exam:  oral temperature is 98.6 F (37 C). His blood pressure is 119/74 and his pulse is 79. His respiration is 18 and oxygen saturation is 98%.   Wt Readings from Last 3 Encounters:  04/03/18 238 lb (108 kg)  03/31/18 225 lb (102.1 kg)  03/27/18 236 lb 9.6 oz (107.3 kg)    Physical Exam  Constitutional: He is oriented to person, place, and time.  HENT:  Head: Normocephalic and  atraumatic.  Mouth/Throat: Oropharynx is clear and moist.  Eyes: Pupils are equal, round, and reactive to light. EOM are normal.  Neck: Normal range of motion.  Cardiovascular: Normal rate, regular rhythm and normal heart sounds.  Pulmonary/Chest: Effort normal and breath sounds normal.  Abdominal: Soft. Bowel sounds are normal.  Musculoskeletal: Normal range of motion. He exhibits no edema, tenderness or deformity.  Lymphadenopathy:    He has no cervical adenopathy.  Neurological: He is alert and oriented to person, place, and time.  Skin: Skin is warm and dry. No rash noted. No erythema.  Psychiatric: He has a normal mood and affect. His behavior is normal. Judgment and thought content normal.    Vitals reviewed.    Lab Results  Component Value Date   WBC 6.4 04/11/2018   HGB 11.6 (L) 04/11/2018   HCT 36.0 (L) 04/11/2018   MCV 85.5 04/11/2018   PLT 285 04/11/2018   No results found for: FERRITIN, IRON, TIBC, UIBC, IRONPCTSAT Lab Results  Component Value Date   RBC 4.21 04/11/2018   No results found for: KPAFRELGTCHN, LAMBDASER, KAPLAMBRATIO No results found for: IGGSERUM, IGA, IGMSERUM No results found for: Kathrynn Ducking, MSPIKE, SPEI   Chemistry      Component Value Date/Time   NA 139 04/11/2018 0810   NA 147 (H) 11/11/2017 1337   NA 141 10/13/2017 1118   K 4.1 04/11/2018 0810   K 3.9 11/11/2017 1337   K 3.6 10/13/2017 1118   CL 105 04/11/2018 0810   CL 107 11/11/2017 1337   CO2 29 04/11/2018 0810   CO2 28 11/11/2017 1337   CO2 25 10/13/2017 1118   BUN 10 04/11/2018 0810   BUN 12 11/11/2017 1337   BUN 9.1 10/13/2017 1118   CREATININE 0.90 04/11/2018 0810   CREATININE 0.9 11/11/2017 1337   CREATININE 0.7 10/13/2017 1118      Component Value Date/Time   CALCIUM 9.4 04/11/2018 0810   CALCIUM 9.2 11/11/2017 1337   CALCIUM 9.2 10/13/2017 1118   ALKPHOS 202 (H) 04/11/2018 0810   ALKPHOS 166 (H) 11/11/2017 1337   ALKPHOS 181 (H) 10/13/2017 1118   AST 31 04/11/2018 0810   AST 38 (H) 10/13/2017 1118   ALT 29 04/11/2018 0810   ALT 38 11/11/2017 1337   ALT 34 10/13/2017 1118   BILITOT 0.7 04/11/2018 0810   BILITOT 1.02 10/13/2017 1118      Impression and Plan: Alexander Fry is a very pleasant 57 yo caucasian gentleman with metastatic renal cell carcinoma.  For right now, we will proceed with his fourth cycle of treatment.  We will have to get a PET scan on him.  I want to see how everything looks.  Hopefully, he will be responding.  He is on Xarelto.  The thrombus in his right neck hopefully is also responding.  We will plan to get him back to see [name] in another 3 weeks.  [name], MD 5/7/20198:47 AM

## 2018-04-12 ENCOUNTER — Ambulatory Visit: Payer: BLUE CROSS/BLUE SHIELD

## 2018-04-13 ENCOUNTER — Ambulatory Visit: Payer: BLUE CROSS/BLUE SHIELD

## 2018-04-13 ENCOUNTER — Encounter: Payer: Self-pay | Admitting: Physician Assistant

## 2018-04-13 DIAGNOSIS — M1710 Unilateral primary osteoarthritis, unspecified knee: Secondary | ICD-10-CM

## 2018-04-13 DIAGNOSIS — C7951 Secondary malignant neoplasm of bone: Secondary | ICD-10-CM

## 2018-04-13 MED ORDER — TRAMADOL HCL 50 MG PO TABS: 100.0000 mg | ORAL_TABLET | Freq: Every evening | ORAL | 1 refills | Status: DC | PRN

## 2018-04-14 ENCOUNTER — Ambulatory Visit: Payer: BLUE CROSS/BLUE SHIELD

## 2018-04-24 ENCOUNTER — Encounter: Payer: Self-pay | Admitting: Hematology & Oncology

## 2018-04-25 ENCOUNTER — Ambulatory Visit (HOSPITAL_COMMUNITY): Payer: BLUE CROSS/BLUE SHIELD

## 2018-04-26 ENCOUNTER — Encounter: Payer: Self-pay | Admitting: Hematology & Oncology

## 2018-04-27 ENCOUNTER — Encounter: Payer: Self-pay | Admitting: Radiation Oncology

## 2018-04-27 NOTE — Progress Notes (Signed)
error 

## 2018-04-28 ENCOUNTER — Ambulatory Visit (HOSPITAL_COMMUNITY)
Admission: RE | Admit: 2018-04-28 | Discharge: 2018-04-28 | Disposition: A | Discharge status: Home / Self Care | Payer: BLUE CROSS/BLUE SHIELD | Source: Ambulatory Visit | Attending: Hematology & Oncology | Admitting: Hematology & Oncology

## 2018-04-28 DIAGNOSIS — C7951 Secondary malignant neoplasm of bone: Secondary | ICD-10-CM | POA: Diagnosis not present

## 2018-04-28 DIAGNOSIS — J320 Chronic maxillary sinusitis: Secondary | ICD-10-CM | POA: Insufficient documentation

## 2018-04-28 DIAGNOSIS — I700 Atherosclerosis of aorta: Secondary | ICD-10-CM | POA: Insufficient documentation

## 2018-04-28 DIAGNOSIS — R590 Localized enlarged lymph nodes: Secondary | ICD-10-CM | POA: Insufficient documentation

## 2018-04-28 DIAGNOSIS — C641 Malignant neoplasm of right kidney, except renal pelvis: Secondary | ICD-10-CM | POA: Insufficient documentation

## 2018-04-28 DIAGNOSIS — I517 Cardiomegaly: Secondary | ICD-10-CM | POA: Insufficient documentation

## 2018-04-28 DIAGNOSIS — K8020 Calculus of gallbladder without cholecystitis without obstruction: Secondary | ICD-10-CM | POA: Diagnosis not present

## 2018-04-28 LAB — GLUCOSE, CAPILLARY: GLUCOSE-CAPILLARY: 96 mg/dL (ref 65–99)

## 2018-04-28 MED ORDER — FLUDEOXYGLUCOSE F - 18 (FDG) INJECTION
11.5000 | Freq: Once | INTRAVENOUS | Status: AC | PRN
  Administered 2018-04-28: 11.5 via INTRAVENOUS

## 2018-05-02 ENCOUNTER — Telehealth: Payer: Self-pay | Admitting: Pharmacy Technician

## 2018-05-02 ENCOUNTER — Encounter: Payer: Self-pay | Admitting: Pharmacist

## 2018-05-02 ENCOUNTER — Other Ambulatory Visit: Payer: Self-pay | Admitting: *Deleted

## 2018-05-02 ENCOUNTER — Telehealth: Payer: Self-pay | Admitting: Pharmacist

## 2018-05-02 ENCOUNTER — Inpatient Hospital Stay: Payer: BLUE CROSS/BLUE SHIELD

## 2018-05-02 ENCOUNTER — Inpatient Hospital Stay (HOSPITAL_BASED_OUTPATIENT_CLINIC_OR_DEPARTMENT_OTHER): Payer: BLUE CROSS/BLUE SHIELD | Admitting: Hematology & Oncology

## 2018-05-02 ENCOUNTER — Encounter: Payer: Self-pay | Admitting: Hematology & Oncology

## 2018-05-02 VITALS — BP 118/67 | HR 80 | Temp 99.0°F | Resp 17 | Wt 227.0 lb

## 2018-05-02 DIAGNOSIS — C7931 Secondary malignant neoplasm of brain: Secondary | ICD-10-CM | POA: Diagnosis not present

## 2018-05-02 DIAGNOSIS — C787 Secondary malignant neoplasm of liver and intrahepatic bile duct: Secondary | ICD-10-CM | POA: Diagnosis not present

## 2018-05-02 DIAGNOSIS — C641 Malignant neoplasm of right kidney, except renal pelvis: Secondary | ICD-10-CM

## 2018-05-02 DIAGNOSIS — Z7901 Long term (current) use of anticoagulants: Secondary | ICD-10-CM

## 2018-05-02 DIAGNOSIS — Z5112 Encounter for antineoplastic immunotherapy: Secondary | ICD-10-CM | POA: Diagnosis not present

## 2018-05-02 DIAGNOSIS — C649 Malignant neoplasm of unspecified kidney, except renal pelvis: Secondary | ICD-10-CM | POA: Diagnosis not present

## 2018-05-02 DIAGNOSIS — C7951 Secondary malignant neoplasm of bone: Secondary | ICD-10-CM | POA: Diagnosis not present

## 2018-05-02 DIAGNOSIS — I82C11 Acute embolism and thrombosis of right internal jugular vein: Secondary | ICD-10-CM

## 2018-05-02 DIAGNOSIS — E032 Hypothyroidism due to medicaments and other exogenous substances: Secondary | ICD-10-CM

## 2018-05-02 LAB — CBC WITH DIFFERENTIAL (CANCER CENTER ONLY)
BASOS ABS: 0 10*3/uL (ref 0.0–0.1)
Basophils Relative: 0 %
Eosinophils Absolute: 0.2 10*3/uL (ref 0.0–0.5)
Eosinophils Relative: 2 %
HEMATOCRIT: 36.4 % — AB (ref 38.7–49.9)
HEMOGLOBIN: 11.7 g/dL — AB (ref 13.0–17.1)
Lymphocytes Relative: 23 %
Lymphs Abs: 1.8 10*3/uL (ref 0.9–3.3)
MCH: 26.8 pg — ABNORMAL LOW (ref 28.0–33.4)
MCHC: 32.1 g/dL (ref 32.0–35.9)
MCV: 83.3 fL (ref 82.0–98.0)
MONO ABS: 0.8 10*3/uL (ref 0.1–0.9)
Monocytes Relative: 10 %
NEUTROS ABS: 4.8 10*3/uL (ref 1.5–6.5)
NEUTROS PCT: 65 %
Platelet Count: 355 10*3/uL (ref 145–400)
RBC: 4.37 MIL/uL (ref 4.20–5.70)
RDW: 14.9 % (ref 11.1–15.7)
WBC Count: 7.6 10*3/uL (ref 4.0–10.0)

## 2018-05-02 LAB — CMP (CANCER CENTER ONLY)
ALK PHOS: 242 U/L — AB (ref 26–84)
ALT: 32 U/L (ref 10–47)
ANION GAP: 5 (ref 5–15)
AST: 32 U/L (ref 11–38)
Albumin: 3.1 g/dL — ABNORMAL LOW (ref 3.5–5.0)
BILIRUBIN TOTAL: 0.6 mg/dL (ref 0.2–1.6)
BUN: 11 mg/dL (ref 7–22)
CALCIUM: 9.1 mg/dL (ref 8.0–10.3)
CO2: 30 mmol/L (ref 18–33)
Chloride: 103 mmol/L (ref 98–108)
Creatinine: 0.8 mg/dL (ref 0.60–1.20)
Glucose, Bld: 120 mg/dL — ABNORMAL HIGH (ref 73–118)
Potassium: 3.9 mmol/L (ref 3.3–4.7)
Sodium: 138 mmol/L (ref 128–145)
TOTAL PROTEIN: 7.6 g/dL (ref 6.4–8.1)

## 2018-05-02 LAB — TSH: TSH: 3.159 u[IU]/mL (ref 0.320–4.118)

## 2018-05-02 LAB — LACTATE DEHYDROGENASE: LDH: 369 U/L — ABNORMAL HIGH (ref 125–245)

## 2018-05-02 MED ORDER — EVEROLIMUS 5 MG PO TABS: 5.0000 mg | ORAL_TABLET | Freq: Every day | ORAL | 4 refills | Status: DC

## 2018-05-02 MED ORDER — LENVATINIB (18 MG DAILY DOSE) 10 MG & 2 X 4 MG PO CPPK: 18.0000 mg | ORAL_CAPSULE | Freq: Every day | ORAL | 4 refills | Status: DC

## 2018-05-02 MED ORDER — DEXAMETHASONE 0.5 MG/5ML PO SOLN: 0.5000 mg | Freq: Two times a day (BID) | ORAL | 0 refills | Status: DC

## 2018-05-02 MED ORDER — SODIUM CHLORIDE 0.9 % IJ SOLN
10.0000 mL | Freq: Once | INTRAMUSCULAR | Status: AC
  Administered 2018-05-02: 10 mL
  Filled 2018-05-02: qty 10

## 2018-05-02 MED ORDER — DEXAMETHASONE 0.5 MG/5ML PO SOLN
1.0000 mg | Freq: Four times a day (QID) | ORAL | 4 refills | Status: DC
Start: 2018-05-02 — End: 2018-06-02

## 2018-05-02 NOTE — Telephone Encounter (Signed)
Erroneous Encounter

## 2018-05-02 NOTE — Telephone Encounter (Signed)
Oral Oncology Patient Advocate Encounter  Received notification from [employer] that prior authorization for Alexander Fry is required.  PA submitted on CoverMyMeds Key [id number] Status is pending  Oral Oncology Clinic will continue to follow.  [name]. [name], [place] Patient [place] [phone] 05/02/2018 3:03 PM

## 2018-05-02 NOTE — Telephone Encounter (Signed)
Oral Oncology Patient Advocate Encounter  Prior Authorization for Afinitor has been approved.    PA# [id number] Effective dates: 04/02/2018 through 05/01/2021  Oral Oncology Clinic will continue to follow.   Alexander Fry. [name], [place] Patient [place] [phone] 05/02/2018 3:31 PM

## 2018-05-02 NOTE — Telephone Encounter (Signed)
Oral Oncology Pharmacist Encounter  Received new prescriptions for Lenvima (lenvatinib) and Afinitor (everolimus) for the treatment of metastatic renal cell carcinoma clear-cell histology, planned duration until disease progression or unacceptable drug toxicity.  CBC/CMP/TSH from 05/02/18 assessed, no relevant lab abnormalities. Would recommend obtaining a UA before he starts on Lenvima. BP at OV 05/02/18 was wnl. Prescription dose and frequency assessed.   Current medication list in Epic reviewed, no DDIs with Lenvima or Everolimus identified.  Prescription has been e-scribed to the [facility] for benefits analysis and approval.  Oral Oncology Clinic will continue to follow for insurance authorization, copayment issues, initial counseling and start date.  Darl Pikes, PharmD, BCPS Hematology/Oncology Clinical Pharmacist [facility] [phone]  05/02/2018 1:59 PM

## 2018-05-02 NOTE — Telephone Encounter (Signed)
Oral Oncology Patient Advocate Encounter  Prior Authorization for Alexander Fry has been approved.    PA# [id number] Effective dates: 04/02/2018 through 05/01/2021  Oral Oncology Clinic will continue to follow.   Alexander Fry, [place] Patient [place] [phone] 05/02/2018 3:04 PM

## 2018-05-02 NOTE — Telephone Encounter (Signed)
Oral Oncology Patient Advocate Encounter  Received notification from [employer] that prior authorization for Afinitor is required.  PA submitted on CoverMyMeds Key [id number] Status is pending  Oral Oncology Clinic will continue to follow.  [name]. [name], [place] Patient [place] [phone] 05/02/2018 3:30 PM

## 2018-05-02 NOTE — Telephone Encounter (Signed)
Oral Chemotherapy Pharmacist Encounter   Due to insurance restrictions, Lenvima/Afinitor can not be filled at [facility]. He has used [facility] in the past.   At this time Mr. Biscoe is going to a second opinion, if he decides to go with this treatment plan then the prescription will be sent to [facility].   Darl Pikes, PharmD, BCPS Hematology/Oncology Clinical Pharmacist [facility] [phone]  05/02/2018 4:14 PM

## 2018-05-02 NOTE — Progress Notes (Signed)
Hematology and Oncology Follow Up Visit  Alexander Fry [id number] Jun 12, 1961 57 y.o. 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  Unfortunately, his disease is now progressing.  We went ahead and did a PET scan on him on May 24.  The PET scan showed worsening of his disease.  His renal mass is larger.  He has more activity of the renal mass.  He has more adenopathy in the abdomen.  He has no adenopathy in the left neck.  He has new hypermetabolic disease in his pelvis.  This is quite this morning for me.  He had been doing quite well.  His CNS metastasis had improved.  He is lost weight.  He is having some night sweats.  I think these are all indicators that his disease is getting worse.  He has had a decent appetite.  He has had no nausea or vomiting.  He has had no diarrhea.  He has had no rashes.  He is still working.  He will hopefully go up to [place] sometime in June.  Overall, his performance status is ECOG 1.  Medications:  Allergies as of 05/02/2018   No Known Allergies     Medication List        Accurate as of 05/02/18 11:05 AM. Always use your most recent med list.          acetaminophen 650 MG CR tablet Commonly known as:  TYLENOL Take 2 tablets (1,300 mg total) by mouth every 8 (eight) hours as needed for pain.   fexofenadine 60 MG  tablet Commonly known as:  ALLEGRA Take by mouth daily.   finasteride 5 MG tablet Commonly known as:  PROSCAR Take 1 tablet (5 mg total) by mouth daily.   lidocaine-prilocaine cream Commonly known as:  EMLA Apply to affected area once   rivaroxaban 20 MG Tabs tablet Commonly known as:  XARELTO Take 1 tablet (20 mg total) by mouth daily with supper.   traMADol 50 MG tablet Commonly known as:  ULTRAM Take 2 tablets (100 mg total) by mouth at bedtime as needed for moderate pain.       Allergies: No Known Allergies  Past Medical History, Surgical history, Social history, and Family History were reviewed and updated.  Review of Systems: Review of Systems  Constitutional: Negative.   HENT: Negative.   Eyes: Negative.   Respiratory: Negative.   Cardiovascular: Negative.   Gastrointestinal: Negative.   Genitourinary: Negative.   Musculoskeletal: Positive for joint pain.  Skin: Negative.   Neurological: Negative.   Endo/Heme/Allergies: Negative.   Psychiatric/Behavioral: Negative.      Physical Exam:  weight is 227 lb (103 kg). His oral temperature is 99 F (37.2 C). His blood pressure is 118/67 and his pulse is 80. His respiration is 17 and oxygen saturation is 98%.   Wt Readings  from Last 3 Encounters:  05/02/18 227 lb (103 kg)  04/03/18 238 lb (108 kg)  03/31/18 225 lb (102.1 kg)    Physical Exam  Constitutional: He is oriented to person, place, and time.  HENT:  Head: Normocephalic and atraumatic.  Mouth/Throat: Oropharynx is clear and moist.  Eyes: Pupils are equal, round, and reactive to light. EOM are normal.  Neck: Normal range of motion.  Cardiovascular: Normal rate, regular rhythm and normal heart sounds.  Pulmonary/Chest: Effort normal and breath sounds normal.  Abdominal: Soft. Bowel sounds are normal.  Musculoskeletal: Normal range of motion. He exhibits no edema, tenderness or deformity.  Lymphadenopathy:    He has no cervical adenopathy.    Neurological: He is alert and oriented to person, place, and time.  Skin: Skin is warm and dry. No rash noted. No erythema.  Psychiatric: He has a normal mood and affect. His behavior is normal. Judgment and thought content normal.  Vitals reviewed.    Lab Results  Component Value Date   WBC 7.6 05/02/2018   HGB 11.7 (L) 05/02/2018   HCT 36.4 (L) 05/02/2018   MCV 83.3 05/02/2018   PLT 355 05/02/2018   No results found for: FERRITIN, IRON, TIBC, UIBC, IRONPCTSAT Lab Results  Component Value Date   RBC 4.37 05/02/2018   No results found for: KPAFRELGTCHN, LAMBDASER, KAPLAMBRATIO No results found for: IGGSERUM, IGA, IGMSERUM No results found for: Kathrynn Ducking, MSPIKE, SPEI   Chemistry      Component Value Date/Time   NA 138 05/02/2018 0916   NA 147 (H) 11/11/2017 1337   NA 141 10/13/2017 1118   K 3.9 05/02/2018 0916   K 3.9 11/11/2017 1337   K 3.6 10/13/2017 1118   CL 103 05/02/2018 0916   CL 107 11/11/2017 1337   CO2 30 05/02/2018 0916   CO2 28 11/11/2017 1337   CO2 25 10/13/2017 1118   BUN 11 05/02/2018 0916   BUN 12 11/11/2017 1337   BUN 9.1 10/13/2017 1118   CREATININE 0.80 05/02/2018 0916   CREATININE 0.9 11/11/2017 1337   CREATININE 0.7 10/13/2017 1118      Component Value Date/Time   CALCIUM 9.1 05/02/2018 0916   CALCIUM 9.2 11/11/2017 1337   CALCIUM 9.2 10/13/2017 1118   ALKPHOS 242 (H) 05/02/2018 0916   ALKPHOS 166 (H) 11/11/2017 1337   ALKPHOS 181 (H) 10/13/2017 1118   AST 32 05/02/2018 0916   AST 38 (H) 10/13/2017 1118   ALT 32 05/02/2018 0916   ALT 38 11/11/2017 1337   ALT 34 10/13/2017 1118   BILITOT 0.6 05/02/2018 0916   BILITOT 1.02 10/13/2017 1118      Impression and Plan: Alexander Fry is a very pleasant 57 yo caucasian gentleman with metastatic renal cell carcinoma.   I am really distressed over the fact that he is progressed.  He is already had 3 lines of treatment.  We are running out of  options.  I do think that a decent option would be using Lenvatinib with everolimus.  I think this has shown some decent response rates.  Toxicity is definitely manageable.  The other option would be to get him out to [facility] for a second opinion.  I think this would be reasonable if he does not respond to the lenvatinib/everolimus combination.  I talked to him for about 35 minutes.  We reviewed the PET scan results.  I think the fact that his alkaline phosphatase is going up is an  indicator that he is progressing.  I would treat him with 2 cycles of lenvatinib/everolimus and then repeat his scans.  This is really tough.  I just feel bad for Mr. Wartman.  He is done everything we have asked him to do.  I would like to see him back in 3 weeks and we will see how he is doing.  We will check his liver function studies.   Marland Kitchen  [name], MD 5/28/201911:05 AM

## 2018-05-02 NOTE — Patient Instructions (Signed)
Implanted Port Home Guide An implanted port is a type of central line that is placed under the skin. Central lines are used to provide IV access when treatment or nutrition needs to be given through a person's veins. Implanted ports are used for long-term IV access. An implanted port may be placed because:  You need IV medicine that would be irritating to the small veins in your hands or arms.  You need long-term IV medicines, such as antibiotics.  You need IV nutrition for a long period.  You need frequent blood draws for lab tests.  You need dialysis.  Implanted ports are usually placed in the chest area, but they can also be placed in the upper arm, the abdomen, or the leg. An implanted port has two main parts:  Reservoir. The reservoir is round and will appear as a small, raised area under your skin. The reservoir is the part where a needle is inserted to give medicines or draw blood.  Catheter. The catheter is a thin, flexible tube that extends from the reservoir. The catheter is placed into a large vein. Medicine that is inserted into the reservoir goes into the catheter and then into the vein.  How will I care for my incision site? Do not get the incision site wet. Bathe or shower as directed by your health care provider. How is my port accessed? Special steps must be taken to access the port:  Before the port is accessed, a numbing cream can be placed on the skin. This helps numb the skin over the port site.  Your health care provider uses a sterile technique to access the port. ? Your health care provider must put on a mask and sterile gloves. ? The skin over your port is cleaned carefully with an antiseptic and allowed to dry. ? The port is gently pinched between sterile gloves, and a needle is inserted into the port.  Only "non-coring" port needles should be used to access the port. Once the port is accessed, a blood return should be checked. This helps ensure that the port  is in the vein and is not clogged.  If your port needs to remain accessed for a constant infusion, a clear (transparent) bandage will be placed over the needle site. The bandage and needle will need to be changed every week, or as directed by your health care provider.  Keep the bandage covering the needle clean and dry. Do not get it wet. Follow your health care provider's instructions on how to take a shower or bath while the port is accessed.  If your port does not need to stay accessed, no bandage is needed over the port.  What is flushing? Flushing helps keep the port from getting clogged. Follow your health care provider's instructions on how and when to flush the port. Ports are usually flushed with saline solution or a medicine called heparin. The need for flushing will depend on how the port is used.  If the port is used for intermittent medicines or blood draws, the port will need to be flushed: ? After medicines have been given. ? After blood has been drawn. ? As part of routine maintenance.  If a constant infusion is running, the port may not need to be flushed.  How long will my port stay implanted? The port can stay in for as long as your health care provider thinks it is needed. When it is time for the port to come out, surgery will be   done to remove it. The procedure is similar to the one performed when the port was put in. When should I seek immediate medical care? When you have an implanted port, you should seek immediate medical care if:  You notice a bad smell coming from the incision site.  You have swelling, redness, or drainage at the incision site.  You have more swelling or pain at the port site or the surrounding area.  You have a fever that is not controlled with medicine.  This information is not intended to replace advice given to you by your health care provider. Make sure you discuss any questions you have with your health care provider. Document  Released: 11/22/2005 Document Revised: 04/29/2016 Document Reviewed: 07/30/2013 Elsevier Interactive Patient Education  2017 Elsevier Inc.  

## 2018-05-03 ENCOUNTER — Encounter: Payer: Self-pay | Admitting: Physician Assistant

## 2018-05-03 ENCOUNTER — Other Ambulatory Visit: Payer: Self-pay | Admitting: Physician Assistant

## 2018-05-03 ENCOUNTER — Encounter: Payer: Self-pay | Admitting: Hematology & Oncology

## 2018-05-03 DIAGNOSIS — R310 Gross hematuria: Secondary | ICD-10-CM | POA: Insufficient documentation

## 2018-05-05 ENCOUNTER — Ambulatory Visit
Admission: RE | Admit: 2018-05-05 | Discharge: 2018-05-05 | Disposition: A | Discharge status: Home / Self Care | Payer: BLUE CROSS/BLUE SHIELD | Source: Ambulatory Visit | Attending: Radiation Oncology | Admitting: Radiation Oncology

## 2018-05-09 ENCOUNTER — Encounter: Payer: Self-pay | Admitting: Hematology & Oncology

## 2018-05-16 ENCOUNTER — Other Ambulatory Visit: Payer: BLUE CROSS/BLUE SHIELD

## 2018-05-16 ENCOUNTER — Ambulatory Visit: Payer: BLUE CROSS/BLUE SHIELD

## 2018-05-16 ENCOUNTER — Ambulatory Visit: Payer: BLUE CROSS/BLUE SHIELD | Admitting: Hematology & Oncology

## 2018-05-19 ENCOUNTER — Encounter: Payer: Self-pay | Admitting: Physician Assistant

## 2018-05-19 ENCOUNTER — Encounter: Payer: Self-pay | Admitting: Radiation Oncology

## 2018-05-19 ENCOUNTER — Ambulatory Visit
Admission: RE | Admit: 2018-05-19 | Discharge: 2018-05-19 | Disposition: A | Discharge status: Home / Self Care | Payer: BLUE CROSS/BLUE SHIELD | Source: Ambulatory Visit | Attending: Radiation Oncology | Admitting: Radiation Oncology

## 2018-05-19 ENCOUNTER — Other Ambulatory Visit: Payer: Self-pay

## 2018-05-19 VITALS — BP 126/59 | HR 78 | Temp 99.1°F | Ht 72.0 in | Wt 231.8 lb

## 2018-05-19 DIAGNOSIS — Z79899 Other long term (current) drug therapy: Secondary | ICD-10-CM | POA: Insufficient documentation

## 2018-05-19 DIAGNOSIS — M25562 Pain in left knee: Secondary | ICD-10-CM | POA: Diagnosis not present

## 2018-05-19 DIAGNOSIS — Z08 Encounter for follow-up examination after completed treatment for malignant neoplasm: Secondary | ICD-10-CM | POA: Diagnosis not present

## 2018-05-19 DIAGNOSIS — M25561 Pain in right knee: Secondary | ICD-10-CM | POA: Insufficient documentation

## 2018-05-19 DIAGNOSIS — R319 Hematuria, unspecified: Secondary | ICD-10-CM | POA: Diagnosis not present

## 2018-05-19 DIAGNOSIS — C649 Malignant neoplasm of unspecified kidney, except renal pelvis: Secondary | ICD-10-CM | POA: Diagnosis present

## 2018-05-19 DIAGNOSIS — C7951 Secondary malignant neoplasm of bone: Secondary | ICD-10-CM

## 2018-05-19 DIAGNOSIS — C7931 Secondary malignant neoplasm of brain: Secondary | ICD-10-CM

## 2018-05-19 DIAGNOSIS — C7800 Secondary malignant neoplasm of unspecified lung: Secondary | ICD-10-CM | POA: Diagnosis not present

## 2018-05-19 DIAGNOSIS — Z7901 Long term (current) use of anticoagulants: Secondary | ICD-10-CM | POA: Insufficient documentation

## 2018-05-19 DIAGNOSIS — Z923 Personal history of irradiation: Secondary | ICD-10-CM | POA: Insufficient documentation

## 2018-05-19 DIAGNOSIS — M1710 Unilateral primary osteoarthritis, unspecified knee: Secondary | ICD-10-CM

## 2018-05-19 MED ORDER — TRAMADOL HCL 50 MG PO TABS: 100.0000 mg | ORAL_TABLET | Freq: Every evening | ORAL | 1 refills | Status: DC | PRN

## 2018-05-19 NOTE — Progress Notes (Signed)
Radiation Oncology         [phone] ________________________________  Name: Alexander Fry MRN: [id number]  Date: 05/19/2018  DOB: 14-Nov-1961  Follow-Up Visit Note  Outpatient  CC: [name], PA-C  [name], MD  Diagnosis:   Metastatic renal cell carcinoma to the brain and lumbar spine     ICD-10-CM   1. Bone metastases (HCC) C79.51   2. Brain metastases ([place]) C79.31     CHIEF COMPLAINT: Here for follow-up and surveillance of renal cancer  Prior Radiation:    04/03/2018:  Right ilium, 8 Gy in 1 fraction for a total dose of 8 Gy  11/18/17: 20 Gy delivered to the brain in one fraction using Photon // 6FFF  06/07/17: Brain metastases treated to 20 Gy in 1 fraction; 15 targets (orginially intended on 14 targets but an additional lesion was appreciated during RT planning)  were treated using 6 Rapid Arc VMAT Beams to a prescription dose of 20 Gy.  02/25/17, 02/28/17, 03/02/17:  L1 Spine target was treated using 3 Rapid Arc VMAT Beams to a prescription dose of  27 Gy in 3 fractions. And L4 Spine target was treated using 3 Rapid Arc VMAT Beams to a prescription dose of  of 27 Gy in 3 fractions  02/21/17: Right Post Parietal 91mm target was treated using 4Dynamic Conformal Arcs to a prescription dose of 20 Gy. Left Post Parietal 3 mm target was treated using 3 circular collimator arcs to a prescription dose of 20 Gy. Right Occipital 37mm target was treated using 3Dynamic Conformal Arcs to a prescription dose of 20 Gy.  Left Occipital 4 mm target was treated using 3Dynamic Conformal Arcs to a prescription dose of 20 Gy.   Narrative:  The patient returns today for routine follow-up. He reports improved pain to his Right hip since receiving radiation on 04/03/18. He reports bilateral knee pain when getting to a standing position and walking up and down inclines. He does report intermittent hematuria. He reports that he has discontinued using XARELTO for three days. Reports  bleeding in his urine started around 04/16/18. He saw Dr. Marin Olp on 05/02/18 and has had partial progression of his disease - especially in abdomen and right kidney.  Right ilium lesion is much better, chest is slightly better, a couple new asymptomatic bone lesions are in the pelvic/sacrum. He was offered po immunotherapy and a second opinion at [facility]. He would like to talk today about new areas of metastatic cancer that were on his recent PET scan. He reports that radiation to hip has helped and denies having pain.  I reviewed the recent  PET images (May) with him today in detail. Compared to Feb images.  As a review, most recent MRI of the brain on 03/27/18 demonstrates resolution of right cerebellar metastatic deposit since the prior study. Other treated lesions are stable to improved. No new lesions     ALLERGIES:  has No Known Allergies.  Meds: Current Outpatient Medications  Medication Sig Dispense Refill  . acetaminophen (TYLENOL) 650 MG CR tablet Take 2 tablets (1,300 mg total) by mouth every 8 (eight) hours as needed for pain. 90 tablet 3  . fexofenadine (ALLEGRA) 60 MG tablet Take by mouth daily.    Marland Kitchen lidocaine-prilocaine (EMLA) cream Apply to affected area once 30 g 3  . rivaroxaban (XARELTO) 20 MG TABS tablet Take 1 tablet (20 mg total) by mouth daily with supper. 30 tablet 3  . traMADol (ULTRAM) 50 MG tablet  Take 2 tablets (100 mg total) by mouth at bedtime as needed for moderate pain. 60 tablet 1  . dexamethasone (DECADRON) 0.5 MG/5ML solution Take 10 mLs (1 mg total) by mouth 4 (four) times daily. Swish in mouth for 2 minutes and spit out. Avoid eating/drinking for 1hr after rinse (Patient not taking: Reported on 05/19/2018) 500 mL 4  . everolimus (AFINITOR) 5 MG tablet Take 1 tablet (5 mg total) by mouth daily. (Patient not taking: Reported on 05/19/2018) 30 tablet 4  . lenvatinib 18 mg daily dose (LENVIMA 18 MG DAILY DOSE) 10 & 4 (2) MG capsule Take 18 mg by mouth daily.  (Patient not taking: Reported on 05/19/2018) 90 capsule 4   No current facility-administered medications for this encounter.     Physical Findings: The patient is in no acute distress. Patient is alert and oriented.  height is 6' (1.829 m) and weight is 231 lb 12.8 oz (105.1 kg). His temperature is 99.1 F (37.3 C). His blood pressure is 126/59 (abnormal) and his pulse is 78. His oxygen saturation is 98%. .  NAD, ambulatory ECOG 0, KPS 90  Lab Findings: Lab Results  Component Value Date   WBC 7.6 05/02/2018   HGB 11.7 (L) 05/02/2018   HCT 36.4 (L) 05/02/2018   MCV 83.3 05/02/2018   PLT 355 05/02/2018      CMP     Component Value Date/Time   NA 138 05/02/2018 0916   NA 147 (H) 11/11/2017 1337   NA 141 10/13/2017 1118   K 3.9 05/02/2018 0916   K 3.9 11/11/2017 1337   K 3.6 10/13/2017 1118   CL 103 05/02/2018 0916   CL 107 11/11/2017 1337   CO2 30 05/02/2018 0916   CO2 28 11/11/2017 1337   CO2 25 10/13/2017 1118   GLUCOSE 120 (H) 05/02/2018 0916   GLUCOSE 107 11/11/2017 1337   BUN 11 05/02/2018 0916   BUN 12 11/11/2017 1337   BUN 9.1 10/13/2017 1118   CREATININE 0.80 05/02/2018 0916   CREATININE 0.9 11/11/2017 1337   CREATININE 0.7 10/13/2017 1118   CALCIUM 9.1 05/02/2018 0916   CALCIUM 9.2 11/11/2017 1337   CALCIUM 9.2 10/13/2017 1118   PROT 7.6 05/02/2018 0916   PROT 6.6 11/11/2017 1337   PROT 7.4 10/13/2017 1118   ALBUMIN 3.1 (L) 05/02/2018 0916   ALBUMIN 3.3 11/11/2017 1337   ALBUMIN 3.9 10/13/2017 1118   AST 32 05/02/2018 0916   AST 38 (H) 10/13/2017 1118   ALT 32 05/02/2018 0916   ALT 38 11/11/2017 1337   ALT 34 10/13/2017 1118   ALKPHOS 242 (H) 05/02/2018 0916   ALKPHOS 166 (H) 11/11/2017 1337   ALKPHOS 181 (H) 10/13/2017 1118   BILITOT 0.6 05/02/2018 0916   BILITOT 1.02 10/13/2017 1118   GFRNONAA >60 12/21/2017 1124   GFRAA >60 12/21/2017 1124     Radiographic Findings: Nm Pet Image Restag (ps) Skull Base To Thigh  Result Date:  04/28/2018 CLINICAL DATA:  Subsequent treatment strategy for metastatic renal cancer. EXAM: NUCLEAR MEDICINE PET SKULL BASE TO THIGH TECHNIQUE: 11.5 mCi F-18 FDG was injected intravenously. Full-ring PET imaging was performed from the skull base to thigh after the radiotracer. CT data was obtained and used for attenuation correction and anatomic localization. Fasting blood glucose: 96 mg/dl COMPARISON:  Multiple exams, including 01/16/2018 FINDINGS: Mediastinal blood pool activity: SUV max 1.9 NECK: The patient has tiny known intracranial lesions are not readily apparent on PET-CT but are probably below sensitive size  thresholds. Bilateral palatine tonsillar activity is symmetric and likely physiologic. New left IV lymph nodes are present, with a 1.2 cm index node on image 51/4 having maximum SUV 6.5. A small left level V lymph node measuring 6 mm in diameter on image 47/4 has a maximum SUV of 3.2 and is new compared to the prior exam. Incidental CT findings: Bilateral chronic maxillary sinusitis. CHEST: A left supraclavicular node measuring 1.3 cm in short axis on image 54/4 (formerly 0.7 cm) has a maximum SUV of 8.0 (formerly 8.0). Pathologic and hypermetabolic prevascular, subcarinal, bilateral hilar, right suprahilar and infrahilar, and periaortic adenopathy is present. Conglomerate subcarinal adenopathy measures up to 2.9 cm in short axis (formerly 3.4 cm) with maximum SUV 15.5 (formerly 17.2). A lot of left paratracheal lymph node which formerly had a maximum SUV of 12.9 has resolved, and some of the other lymph nodes have significantly improved. The left infrahilar adenopathy is considerably improved. Distal para-aortic adenopathy observed with a left periaortic node measuring 2.0 cm in short axis on image 109/4 (formerly 1.1 cm) with maximum SUV 14.3 (formerly 8.8.). Motion blurring of a right upper lobe peribronchovascular nodule measuring approximately 0.7 by 0.9 cm (formerly 1.3 by 1.0 cm), maximum SUV  2.9 (formerly 3.6). Enlargement of a right lower lobe peribronchovascular nodule measuring 2.2 by 1.5 cm on image 35/5 (formerly 1.5 by 0.7 cm) with maximum SUV 3.4 (formerly 1.4). The peribronchovascular nodularity and infrahilar nodularity in the left lower lobe has essentially resolved. Incidental CT findings: Right Port-A-Cath tip: Cavoatrial junction. Mild cardiomegaly. Atherosclerotic calcification of the aortic arch. ABDOMEN/PELVIS: Hypermetabolic retrocrural, peripancreatic, celiac chain, mesenteric, periaortic, and left common iliac adenopathy is observed and appears generally increased compared prior. For example, a new peripancreatic node measuring 1.8 cm in short axis on image 116/4 has a maximum SUV of 7.7. A left periaortic lymph node measures 1.9 cm in short axis on image 136/4 (formerly 0.7 cm) and has a maximum SUV of 10.8 (formerly 6.3). There are also new mesenteric lymph nodes which are hypermetabolic. The right renal mass measures 9.4 by 8.5 cm (formerly 6.9 by 7.7 cm) and demonstrates greater degree of marginal hypermetabolic activity specially posteriorly indicating increasing solid component and rapid growth. Tumor nodularity in the perirenal space. Incidental CT findings: Cholelithiasis. Left kidney upper pole cyst. SKELETON: Mild increase in sclerosis in the metastatic lesions at T12 and eccentric to the right at L4. Neither of these lesions is hypermetabolic. However, there is a new 9 mm lytic lesion in the left iliac bone on image 170/4 with maximum SUV 8.0 compatible with malignancy. There is a new left sacral metastatic lesion just below this vertical level with maximum SUV 10.2 compatible with malignancy. Incidental CT findings: none IMPRESSION: 1. Overall worsening especially in the abdominopelvic adenopathy, in the primary mass which has significantly enlarged with a notably larger solid hypermetabolic component, and with new hypermetabolic osseous metastatic lesions in the pelvis.  In the chest, the appearance is more mixed with some areas better and some worse. 2. New left lower neck lymph nodes are hypermetabolic. 3. Other imaging findings of potential clinical significance: Bilateral chronic maxillary sinusitis. Mild cardiomegaly. Aortic Atherosclerosis (ICD10-I70.0). Cholelithiasis. Electronically Signed   By: Van Clines M.D.   On: 04/28/2018 17:07    Impression/Plan:  Mr. Sponaugle has a history of metastatic renal cell carcinoma to the lungs, abdomen, brain and bones.  Good palliation from recent R iliac RT.  He has an appointment with Dr. Marin Olp on 05/23/18 and again  in 06/13/18. He has an appointment Dr. Thera Flake at [facility] on 05/31/18 and with Dr. Tasia Catchings at [facility] on 06/02/18 for a second opinion re: systemic options.  I will see him back in a month, around mid July after his q 83monthly brain imaging. I will confirm with [name] that MRI can be ordered in the middle of July or end of July before appointment with me.   I do not recommend RT to his right kidney unless medical oncology feels that he does not have systemic options and the right kidney tumor causes more symptoms.  Right now, the hematuria is not causing a drastic change in his Hgb.  We did discuss the risks of RT to the kidney, including decreased renal function.  Luckily, however, his labwork (Bun, Cr) are in good range.   _____________________________________   [name], MD  This document serves as a record of services personally performed by [name] MD. It was created on her behalf by Delton Coombes, a trained medical scribe. The creation of this record is based on the scribe's personal observations and the provider's statements to them.

## 2018-05-19 NOTE — Progress Notes (Signed)
Alexander Fry presents for follow up of radiation completed 04/03/18 to [name]. He reports improved pain to his Right hip since receiving radiation on 04/03/18. He reports bilateral knee pain when getting to a standing position and walking up and down inclines. He does report intermittent hematuria. He saw Dr. Marin Olp on 05/02/18 and has had progression of his tumors. [name].   BP (!) 126/59   Pulse 78   Temp 99.1 F (37.3 C)   Ht 6' (1.829 m)   Wt 231 lb 12.8 oz (105.1 kg)   SpO2 98% Comment: room air  BMI 31.44 kg/m    Wt Readings from Last 3 Encounters:  05/19/18 231 lb 12.8 oz (105.1 kg)  05/02/18 227 lb (103 kg)  04/03/18 238 lb (108 kg)

## 2018-05-22 ENCOUNTER — Other Ambulatory Visit: Payer: Self-pay | Admitting: *Deleted

## 2018-05-22 ENCOUNTER — Encounter: Payer: Self-pay | Admitting: Radiation Oncology

## 2018-05-22 DIAGNOSIS — C7931 Secondary malignant neoplasm of brain: Secondary | ICD-10-CM

## 2018-05-22 DIAGNOSIS — C7949 Secondary malignant neoplasm of other parts of nervous system: Principal | ICD-10-CM

## 2018-05-23 ENCOUNTER — Encounter: Payer: Self-pay | Admitting: Hematology & Oncology

## 2018-05-23 ENCOUNTER — Inpatient Hospital Stay: Payer: BLUE CROSS/BLUE SHIELD

## 2018-05-23 ENCOUNTER — Other Ambulatory Visit: Payer: Self-pay

## 2018-05-23 ENCOUNTER — Inpatient Hospital Stay: Payer: BLUE CROSS/BLUE SHIELD | Attending: Family

## 2018-05-23 ENCOUNTER — Inpatient Hospital Stay (HOSPITAL_BASED_OUTPATIENT_CLINIC_OR_DEPARTMENT_OTHER): Payer: BLUE CROSS/BLUE SHIELD | Admitting: Hematology & Oncology

## 2018-05-23 ENCOUNTER — Ambulatory Visit: Payer: BLUE CROSS/BLUE SHIELD

## 2018-05-23 ENCOUNTER — Encounter: Payer: Self-pay | Admitting: *Deleted

## 2018-05-23 VITALS — BP 126/66 | HR 78 | Temp 99.2°F | Resp 18 | Wt 227.0 lb

## 2018-05-23 DIAGNOSIS — D500 Iron deficiency anemia secondary to blood loss (chronic): Secondary | ICD-10-CM | POA: Insufficient documentation

## 2018-05-23 DIAGNOSIS — C641 Malignant neoplasm of right kidney, except renal pelvis: Secondary | ICD-10-CM

## 2018-05-23 DIAGNOSIS — C7951 Secondary malignant neoplasm of bone: Secondary | ICD-10-CM

## 2018-05-23 DIAGNOSIS — C649 Malignant neoplasm of unspecified kidney, except renal pelvis: Secondary | ICD-10-CM

## 2018-05-23 DIAGNOSIS — Z452 Encounter for adjustment and management of vascular access device: Secondary | ICD-10-CM | POA: Insufficient documentation

## 2018-05-23 DIAGNOSIS — Z95828 Presence of other vascular implants and grafts: Secondary | ICD-10-CM

## 2018-05-23 DIAGNOSIS — R635 Abnormal weight gain: Secondary | ICD-10-CM

## 2018-05-23 DIAGNOSIS — K648 Other hemorrhoids: Secondary | ICD-10-CM

## 2018-05-23 DIAGNOSIS — R319 Hematuria, unspecified: Secondary | ICD-10-CM | POA: Insufficient documentation

## 2018-05-23 HISTORY — DX: Iron deficiency anemia secondary to blood loss (chronic): D50.0

## 2018-05-23 LAB — CMP (CANCER CENTER ONLY)
ALK PHOS: 308 U/L — AB (ref 26–84)
ALT: 28 U/L (ref 10–47)
ANION GAP: 10 (ref 5–15)
AST: 31 U/L (ref 11–38)
Albumin: 3.1 g/dL — ABNORMAL LOW (ref 3.5–5.0)
BILIRUBIN TOTAL: 0.6 mg/dL (ref 0.2–1.6)
BUN: 10 mg/dL (ref 7–22)
CO2: 30 mmol/L (ref 18–33)
Calcium: 9.8 mg/dL (ref 8.0–10.3)
Chloride: 103 mmol/L (ref 98–108)
Creatinine: 0.9 mg/dL (ref 0.60–1.20)
Glucose, Bld: 90 mg/dL (ref 73–118)
Potassium: 4.1 mmol/L (ref 3.3–4.7)
Sodium: 143 mmol/L (ref 128–145)
TOTAL PROTEIN: 8 g/dL (ref 6.4–8.1)

## 2018-05-23 LAB — IRON AND TIBC
Iron: 17 ug/dL — ABNORMAL LOW (ref 42–163)
Saturation Ratios: 8 % — ABNORMAL LOW (ref 42–163)
TIBC: 226 ug/dL (ref 202–409)
UIBC: 209 ug/dL

## 2018-05-23 LAB — CBC WITH DIFFERENTIAL (CANCER CENTER ONLY)
Basophils Absolute: 0 10*3/uL (ref 0.0–0.1)
Basophils Relative: 0 %
Eosinophils Absolute: 0.1 10*3/uL (ref 0.0–0.5)
Eosinophils Relative: 2 %
HEMATOCRIT: 35.4 % — AB (ref 38.7–49.9)
HEMOGLOBIN: 11.2 g/dL — AB (ref 13.0–17.1)
LYMPHS PCT: 30 %
Lymphs Abs: 2.3 10*3/uL (ref 0.9–3.3)
MCH: 25.7 pg — AB (ref 28.0–33.4)
MCHC: 31.6 g/dL — AB (ref 32.0–35.9)
MCV: 81.4 fL — ABNORMAL LOW (ref 82.0–98.0)
MONO ABS: 0.8 10*3/uL (ref 0.1–0.9)
MONOS PCT: 11 %
NEUTROS ABS: 4.3 10*3/uL (ref 1.5–6.5)
NEUTROS PCT: 57 %
Platelet Count: 361 10*3/uL (ref 145–400)
RBC: 4.35 MIL/uL (ref 4.20–5.70)
RDW: 15 % (ref 11.1–15.7)
WBC Count: 7.6 10*3/uL (ref 4.0–10.0)

## 2018-05-23 LAB — TSH: TSH: 4.64 u[IU]/mL — AB (ref 0.320–4.118)

## 2018-05-23 LAB — LACTATE DEHYDROGENASE: LDH: 391 U/L — AB (ref 125–245)

## 2018-05-23 LAB — FERRITIN: FERRITIN: 1770 ng/mL — AB (ref 22–316)

## 2018-05-23 MED ORDER — SODIUM CHLORIDE 0.9% FLUSH
10.0000 mL | INTRAVENOUS | Status: DC | PRN
  Administered 2018-05-23: 10 mL via INTRAVENOUS
  Filled 2018-05-23: qty 10

## 2018-05-23 MED ORDER — HEPARIN SOD (PORK) LOCK FLUSH 100 UNIT/ML IV SOLN
500.0000 [IU] | Freq: Once | INTRAVENOUS | Status: AC
  Administered 2018-05-23: 500 [IU] via INTRAVENOUS
  Filled 2018-05-23: qty 5

## 2018-05-23 NOTE — Patient Instructions (Signed)

## 2018-05-23 NOTE — Progress Notes (Addendum)
Hematology and Oncology Follow Up Visit  Denvil Canning [id number] 1961/09/25 57 y.o. 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, pulmonary, liver, lymph node and bone metastasis Thrombus in the RIGHT Internal Jugular Vein Iron def anemia due to hematuria  Past Therapy:  Votrient 800 mg by mouth - stopped on 04/15/2017 due to hepatotoxicity  Cabometyx 40mg  po q day - start 06/22/2017 Radiation therapy to the brain and spine Cabometyx 40 mg by mouth daily - d/c on 01/24/2018  Current Therapy:   Xgeva 120 mg subcutaneous every 3 months - on hold Nivolumab/Ipilimumab - s/p cycle #4 -- d/c on 04/24/2018 Lenvima/Afinitor (18mg  q day/5 mg q day) -- not yet started Xarelto 20 mg po q day IV Feraheme as indicated   Interim History: Mr. Carll is back for follow-up.  He is not taking the [place] or Afinitor yet.  He has a couple second opinions that he wants to get.  He will will be seen a doctor at [facility] on June 26.  He will then see a specialist at [facility] on June 28.  He is up in [place] for a family gathering.  He began to have some hematuria.  He had no fever.  He had no dysuria.  He had no abdominal pain.  He stopped his Xarelto for a couple days.  Everything slowed down.  He then restarted the Xarelto and there is no further hematuria.  He does have some pain in the pelvis.  He has pain in his knees.  His knees cause him the most problem.  I suspect that he probably is iron deficient.  His MCV has been slowly trending downward.  The hemoglobin has been slowly trending downward.  His appetite is doing okay.  He has had no nausea or vomiting.  He is had no rashes.  He has had no headaches.  Overall, his performance status is ECOG 1.  Medications:  Allergies as of 05/23/2018   No Known Allergies     Medication List        Accurate as of 05/23/18  9:02 AM. Always use your most recent med list.          acetaminophen  650 MG CR tablet Commonly known as:  TYLENOL Take 2 tablets (1,300 mg total) by mouth every 8 (eight) hours as needed for pain.   dexamethasone 0.5 MG/5ML solution Commonly known as:  DECADRON Take 10 mLs (1 mg total) by mouth 4 (four) times daily. Swish in mouth for 2 minutes and spit out. Avoid eating/drinking for 1hr after rinse   everolimus 5 MG tablet Commonly known as:  AFINITOR Take 1 tablet (5 mg total) by mouth daily.   fexofenadine 60 MG tablet Commonly known as:  ALLEGRA Take by mouth daily.   lenvatinib 18 mg daily dose 10 & 4 (2) MG capsule Commonly known as:  LENVIMA 18 MG DAILY DOSE Take 18 mg by mouth daily.   lidocaine-prilocaine cream Commonly known as:  EMLA Apply to affected area once   rivaroxaban 20 MG Tabs tablet Commonly known as:  XARELTO Take 1 tablet (20 mg total) by mouth daily with supper.   traMADol 50 MG tablet Commonly known as:  ULTRAM Take 2 tablets (100 mg total) by mouth at bedtime as needed for moderate pain.       Allergies: No Known Allergies  Past Medical History, Surgical history, Social history, and Family History were reviewed and updated.  Review of  Systems: Review of Systems  Constitutional: Negative.   HENT: Negative.   Eyes: Negative.   Respiratory: Negative.   Cardiovascular: Negative.   Gastrointestinal: Negative.   Genitourinary: Negative.   Musculoskeletal: Positive for joint pain.  Skin: Negative.   Neurological: Negative.   Endo/Heme/Allergies: Negative.   Psychiatric/Behavioral: Negative.      Physical Exam:  weight is 227 lb (103 kg). His oral temperature is 99.2 F (37.3 C). His blood pressure is 126/66 and his pulse is 78. His respiration is 18.   Wt Readings from Last 3 Encounters:  05/23/18 227 lb (103 kg)  05/19/18 231 lb 12.8 oz (105.1 kg)  05/02/18 227 lb (103 kg)    Physical Exam  Constitutional: He is oriented to person, place, and time.  HENT:  Head: Normocephalic and atraumatic.    Mouth/Throat: Oropharynx is clear and moist.  Eyes: Pupils are equal, round, and reactive to light. EOM are normal.  Neck: Normal range of motion.  Cardiovascular: Normal rate, regular rhythm and normal heart sounds.  Pulmonary/Chest: Effort normal and breath sounds normal.  Abdominal: Soft. Bowel sounds are normal.  Musculoskeletal: Normal range of motion. He exhibits no edema, tenderness or deformity.  Lymphadenopathy:    He has no cervical adenopathy.  Neurological: He is alert and oriented to person, place, and time.  Skin: Skin is warm and dry. No rash noted. No erythema.  Psychiatric: He has a normal mood and affect. His behavior is normal. Judgment and thought content normal.  Vitals reviewed.    Lab Results  Component Value Date   WBC 7.6 05/23/2018   HGB 11.2 (L) 05/23/2018   HCT 35.4 (L) 05/23/2018   MCV 81.4 (L) 05/23/2018   PLT 361 05/23/2018   No results found for: FERRITIN, IRON, TIBC, UIBC, IRONPCTSAT Lab Results  Component Value Date   RBC 4.35 05/23/2018   No results found for: KPAFRELGTCHN, LAMBDASER, KAPLAMBRATIO No results found for: IGGSERUM, IGA, IGMSERUM No results found for: Kathrynn Ducking, MSPIKE, SPEI   Chemistry      Component Value Date/Time   NA 143 05/23/2018 0806   NA 147 (H) 11/11/2017 1337   NA 141 10/13/2017 1118   K 4.1 05/23/2018 0806   K 3.9 11/11/2017 1337   K 3.6 10/13/2017 1118   CL 103 05/23/2018 0806   CL 107 11/11/2017 1337   CO2 30 05/23/2018 0806   CO2 28 11/11/2017 1337   CO2 25 10/13/2017 1118   BUN 10 05/23/2018 0806   BUN 12 11/11/2017 1337   BUN 9.1 10/13/2017 1118   CREATININE 0.90 05/23/2018 0806   CREATININE 0.9 11/11/2017 1337   CREATININE 0.7 10/13/2017 1118      Component Value Date/Time   CALCIUM 9.8 05/23/2018 0806   CALCIUM 9.2 11/11/2017 1337   CALCIUM 9.2 10/13/2017 1118   ALKPHOS 308 (H) 05/23/2018 0806   ALKPHOS 166 (H) 11/11/2017 1337   ALKPHOS 181  (H) 10/13/2017 1118   AST 31 05/23/2018 0806   AST 38 (H) 10/13/2017 1118   ALT 28 05/23/2018 0806   ALT 38 11/11/2017 1337   ALT 34 10/13/2017 1118   BILITOT 0.6 05/23/2018 0806   BILITOT 1.02 10/13/2017 1118      Impression and Plan: Mr. Loiseau is a very pleasant 57 yo caucasian gentleman with metastatic renal cell carcinoma.   I am really distressed over the fact that he is progressed.  He is already had 3 lines of treatment.  He  is clearly iron deficient.  His iron saturation is only 8%.  His ferritin is 1770 because of acute inflammation from his malignancy.  His serum iron is only 17.  IV iron will clearly help him out.  I talked to him about this.  He probably needs 2 doses of IV iron.  We will see with a second opinions have to say.  May be, Mr. Buckel will get on a clinical trial.  I would certainly favor this.  I will plan to see him back in another month.  I am sure that the second opinions will call me with their recommendations.  I still think that the lenvatinib/everolimus would be a good protocol for him.   [name], MD 6/18/20199:02 AM

## 2018-05-23 NOTE — Addendum Note (Signed)
Addended by: Burney Gauze R on: 05/23/2018 01:46 PM   Modules accepted: Orders

## 2018-05-26 ENCOUNTER — Inpatient Hospital Stay: Payer: BLUE CROSS/BLUE SHIELD

## 2018-05-26 ENCOUNTER — Other Ambulatory Visit: Payer: Self-pay

## 2018-05-26 VITALS — BP 111/62 | HR 73 | Temp 98.2°F | Resp 18

## 2018-05-26 DIAGNOSIS — C641 Malignant neoplasm of right kidney, except renal pelvis: Secondary | ICD-10-CM

## 2018-05-26 DIAGNOSIS — C649 Malignant neoplasm of unspecified kidney, except renal pelvis: Secondary | ICD-10-CM | POA: Diagnosis not present

## 2018-05-26 MED ORDER — HEPARIN SOD (PORK) LOCK FLUSH 100 UNIT/ML IV SOLN
500.0000 [IU] | Freq: Once | INTRAVENOUS | Status: AC
  Administered 2018-05-26: 500 [IU] via INTRAVENOUS
  Filled 2018-05-26: qty 5

## 2018-05-26 MED ORDER — SODIUM CHLORIDE 0.9% FLUSH
10.0000 mL | INTRAVENOUS | Status: DC | PRN
  Administered 2018-05-26: 10 mL via INTRAVENOUS
  Filled 2018-05-26: qty 10

## 2018-05-26 MED ORDER — SODIUM CHLORIDE 0.9 % IV SOLN
510.0000 mg | INTRAVENOUS | Status: DC
  Administered 2018-05-26: 510 mg via INTRAVENOUS
  Filled 2018-05-26: qty 17

## 2018-05-26 NOTE — Patient Instructions (Signed)

## 2018-05-30 ENCOUNTER — Other Ambulatory Visit: Payer: BLUE CROSS/BLUE SHIELD

## 2018-05-30 ENCOUNTER — Ambulatory Visit: Payer: BLUE CROSS/BLUE SHIELD | Admitting: Hematology & Oncology

## 2018-05-30 ENCOUNTER — Ambulatory Visit: Payer: BLUE CROSS/BLUE SHIELD

## 2018-06-01 ENCOUNTER — Inpatient Hospital Stay: Payer: BLUE CROSS/BLUE SHIELD

## 2018-06-01 ENCOUNTER — Other Ambulatory Visit: Payer: Self-pay | Admitting: Family

## 2018-06-01 VITALS — BP 128/71 | HR 79 | Temp 98.6°F | Resp 18

## 2018-06-01 DIAGNOSIS — C641 Malignant neoplasm of right kidney, except renal pelvis: Secondary | ICD-10-CM

## 2018-06-01 DIAGNOSIS — Z91048 Other nonmedicinal substance allergy status: Secondary | ICD-10-CM

## 2018-06-01 DIAGNOSIS — D500 Iron deficiency anemia secondary to blood loss (chronic): Secondary | ICD-10-CM

## 2018-06-01 DIAGNOSIS — C649 Malignant neoplasm of unspecified kidney, except renal pelvis: Secondary | ICD-10-CM | POA: Diagnosis not present

## 2018-06-01 MED ORDER — SODIUM CHLORIDE 0.9 % IV SOLN
510.0000 mg | INTRAVENOUS | Status: DC
  Administered 2018-06-01: 510 mg via INTRAVENOUS
  Filled 2018-06-01: qty 17

## 2018-06-01 MED ORDER — FAMOTIDINE IN NACL 20-0.9 MG/50ML-% IV SOLN
20.0000 mg | Freq: Once | INTRAVENOUS | Status: AC
  Administered 2018-06-01: 20 mg via INTRAVENOUS

## 2018-06-01 MED ORDER — METHYLPREDNISOLONE SODIUM SUCC 125 MG IJ SOLR
125.0000 mg | Freq: Once | INTRAMUSCULAR | Status: AC | PRN
  Administered 2018-06-01: 125 mg via INTRAVENOUS

## 2018-06-01 MED ORDER — METHYLPREDNISOLONE SODIUM SUCC 125 MG IJ SOLR: 125.0000 mg | Freq: Once | INTRAMUSCULAR | Status: DC

## 2018-06-01 MED ORDER — DIPHENHYDRAMINE HCL 50 MG/ML IJ SOLN
25.0000 mg | Freq: Once | INTRAMUSCULAR | Status: AC | PRN
  Administered 2018-06-01: 25 mg via INTRAVENOUS

## 2018-06-01 MED ORDER — DIPHENHYDRAMINE HCL 50 MG/ML IJ SOLN: 25.0000 mg | Freq: Once | INTRAMUSCULAR | Status: DC

## 2018-06-01 MED ORDER — FAMOTIDINE IN NACL 20-0.9 MG/50ML-% IV SOLN: 20.0000 mg | Freq: Once | INTRAVENOUS | Status: DC

## 2018-06-01 NOTE — Progress Notes (Signed)
Patient started treatment with second dose of Feraheme and developed swelling tightness in throat and redness all over. Feraheme stopped immediately. Patient given IV benadryl, pepcid and solumedrol. VSS. He is starting to feel much better and is eating a snack. No s/s of distress at this time. Will re challenge in 30 minutes of patient still doing well.  All future iron infusions will be changed to injectafer.

## 2018-06-02 ENCOUNTER — Telehealth: Payer: Self-pay | Admitting: Pharmacist

## 2018-06-02 ENCOUNTER — Other Ambulatory Visit: Payer: Self-pay | Admitting: Pharmacist

## 2018-06-02 ENCOUNTER — Encounter: Payer: Self-pay | Admitting: Hematology & Oncology

## 2018-06-02 DIAGNOSIS — C641 Malignant neoplasm of right kidney, except renal pelvis: Secondary | ICD-10-CM

## 2018-06-02 MED ORDER — EVEROLIMUS 5 MG PO TABS: 5.0000 mg | ORAL_TABLET | Freq: Every day | ORAL | 4 refills | Status: DC

## 2018-06-02 MED ORDER — DEXAMETHASONE 0.5 MG/5ML PO SOLN: 1.0000 mg | Freq: Four times a day (QID) | ORAL | 4 refills | Status: DC

## 2018-06-02 MED ORDER — LENVATINIB (18 MG DAILY DOSE) 10 MG & 2 X 4 MG PO CPPK: 18.0000 mg | ORAL_CAPSULE | Freq: Every day | ORAL | 4 refills | Status: DC

## 2018-06-02 NOTE — Telephone Encounter (Signed)
Oral Chemotherapy Pharmacist Encounter   Received notification frmo Dr. Antonieta Pert RN Antonietta Barcelona stating the Mr. Shanker has decided to proceed with lenvatinib and everolimus therapy. Due to insurance restriction his prescription has to be sent to [place].  I have sent his dexamethasone solution to his local specialty pharmacy.  Darl Pikes, PharmD, BCPS, [facility] Hematology/Oncology Clinical Pharmacist [facility] [phone]  06/02/2018 2:11 PM

## 2018-06-05 ENCOUNTER — Telehealth: Payer: Self-pay | Admitting: Pharmacy Technician

## 2018-06-05 NOTE — Telephone Encounter (Signed)
Oral Oncology Patient Advocate Encounter  Called [facility] to check the status of Alexander Fry's prescriptions for Lenvima and Afinitor.  They will be delivered to the patient on 06/06/18.  [name] [name] Phone [phone] Fax [phone] 06/05/2018 2:39 PM

## 2018-06-06 ENCOUNTER — Encounter: Payer: Self-pay | Admitting: Hematology & Oncology

## 2018-06-09 ENCOUNTER — Inpatient Hospital Stay: Payer: BLUE CROSS/BLUE SHIELD | Attending: Family

## 2018-06-09 VITALS — BP 148/80 | HR 88 | Temp 98.1°F | Resp 18

## 2018-06-09 DIAGNOSIS — C641 Malignant neoplasm of right kidney, except renal pelvis: Secondary | ICD-10-CM

## 2018-06-09 DIAGNOSIS — I82C11 Acute embolism and thrombosis of right internal jugular vein: Secondary | ICD-10-CM | POA: Diagnosis not present

## 2018-06-09 DIAGNOSIS — Z95828 Presence of other vascular implants and grafts: Secondary | ICD-10-CM

## 2018-06-09 DIAGNOSIS — C649 Malignant neoplasm of unspecified kidney, except renal pelvis: Secondary | ICD-10-CM | POA: Diagnosis not present

## 2018-06-09 DIAGNOSIS — D500 Iron deficiency anemia secondary to blood loss (chronic): Secondary | ICD-10-CM | POA: Insufficient documentation

## 2018-06-09 DIAGNOSIS — C7951 Secondary malignant neoplasm of bone: Secondary | ICD-10-CM | POA: Insufficient documentation

## 2018-06-09 DIAGNOSIS — R319 Hematuria, unspecified: Secondary | ICD-10-CM | POA: Diagnosis present

## 2018-06-09 DIAGNOSIS — Z7901 Long term (current) use of anticoagulants: Secondary | ICD-10-CM | POA: Insufficient documentation

## 2018-06-09 MED ORDER — SODIUM CHLORIDE 0.9% FLUSH
10.0000 mL | INTRAVENOUS | Status: DC | PRN
  Administered 2018-06-09: 10 mL via INTRAVENOUS
  Filled 2018-06-09: qty 10

## 2018-06-09 MED ORDER — HEPARIN SOD (PORK) LOCK FLUSH 100 UNIT/ML IV SOLN
500.0000 [IU] | Freq: Once | INTRAVENOUS | Status: AC
  Administered 2018-06-09: 500 [IU] via INTRAVENOUS
  Filled 2018-06-09: qty 5

## 2018-06-09 MED ORDER — SODIUM CHLORIDE 0.9 % IV SOLN
750.0000 mg | Freq: Once | INTRAVENOUS | Status: AC
  Administered 2018-06-09: 750 mg via INTRAVENOUS
  Filled 2018-06-09: qty 15

## 2018-06-09 NOTE — Patient Instructions (Signed)
Ferric carboxymaltose injection What is this medicine? FERRIC CARBOXYMALTOSE (ferr-ik car-box-ee-mol-toes) is an iron complex. Iron is used to make healthy red blood cells, which carry oxygen and nutrients throughout the body. This medicine is used to treat anemia in people with chronic kidney disease or people who cannot take iron by mouth. This medicine may be used for other purposes; ask your health care provider or pharmacist if you have questions. COMMON BRAND NAME(S): Injectafer What should I tell my health care provider before I take this medicine? They need to know if you have any of these conditions: -anemia not caused by low iron levels -high levels of iron in the blood -liver disease -an unusual or allergic reaction to iron, other medicines, foods, dyes, or preservatives -pregnant or trying to get pregnant -breast-feeding How should I use this medicine? This medicine is for infusion into a vein. It is given by a health care professional in a hospital or clinic setting. Talk to your pediatrician regarding the use of this medicine in children. Special care may be needed. Overdosage: If you think you have taken too much of this medicine contact a poison control center or emergency room at once. NOTE: This medicine is only for you. Do not share this medicine with others. What if I miss a dose? It is important not to miss your dose. Call your doctor or health care professional if you are unable to keep an appointment. What may interact with this medicine? Do not take this medicine with any of the following medications: -deferoxamine -dimercaprol -other iron products This medicine may also interact with the following medications: -chloramphenicol -deferasirox This list may not describe all possible interactions. Give your health care provider a list of all the medicines, herbs, non-prescription drugs, or dietary supplements you use. Also tell them if you smoke, drink alcohol, or use  illegal drugs. Some items may interact with your medicine. What should I watch for while using this medicine? Visit your doctor or health care professional regularly. Tell your doctor if your symptoms do not start to get better or if they get worse. You may need blood work done while you are taking this medicine. You may need to follow a special diet. Talk to your doctor. Foods that contain iron include: whole grains/cereals, dried fruits, beans, or peas, leafy green vegetables, and organ meats (liver, kidney). What side effects may I notice from receiving this medicine? Side effects that you should report to your doctor or health care professional as soon as possible: -allergic reactions like skin rash, itching or hives, swelling of the face, lips, or tongue -breathing problems -changes in blood pressure -feeling faint or lightheaded, falls -flushing, sweating, or hot feelings Side effects that usually do not require medical attention (report to your doctor or health care professional if they continue or are bothersome): -changes in taste -constipation -dizziness -headache -nausea -pain, redness, or irritation at site where injected -vomiting This list may not describe all possible side effects. Call your doctor for medical advice about side effects. You may report side effects to FDA at 1-800-FDA-1088. Where should I keep my medicine? This drug is given in a hospital or clinic and will not be stored at home. NOTE: This sheet is a summary. It may not cover all possible information. If you have questions about this medicine, talk to your doctor, pharmacist, or health care provider.  2018 Elsevier/Gold Standard (2015-12-25 11:20:47)  

## 2018-06-09 NOTE — Progress Notes (Signed)
Patient informed me at time of discharge that he has stopped his Xarelto this am after having 3 instances of "blood in my urine over 2 days". Advised him to see medical treatment if it persists or worsens , specific examples of worry were provided so he knew when to go to ER and told him otherwise I would let MD know so when he returns to clinic he can evaluate and call patient.

## 2018-06-13 ENCOUNTER — Other Ambulatory Visit: Payer: Self-pay | Admitting: Family

## 2018-06-13 ENCOUNTER — Other Ambulatory Visit: Payer: BLUE CROSS/BLUE SHIELD

## 2018-06-13 ENCOUNTER — Telehealth: Payer: Self-pay | Admitting: *Deleted

## 2018-06-13 ENCOUNTER — Ambulatory Visit: Payer: BLUE CROSS/BLUE SHIELD | Admitting: Hematology & Oncology

## 2018-06-13 ENCOUNTER — Ambulatory Visit: Payer: BLUE CROSS/BLUE SHIELD

## 2018-06-13 DIAGNOSIS — I82890 Acute embolism and thrombosis of other specified veins: Secondary | ICD-10-CM

## 2018-06-13 MED ORDER — RIVAROXABAN 10 MG PO TABS: 20.0000 mg | ORAL_TABLET | Freq: Every day | ORAL | 3 refills | Status: DC

## 2018-06-13 NOTE — Telephone Encounter (Addendum)
Patient is aware of new prescription and pharmacy location. He will pick up and begin new dosing.    ----- Message from Eliezer Bottom, NP sent at 06/13/2018 10:48 AM EDT ----- Regarding: FW: blood in urine  He can restart Xarelto at 10 mg PO daily. I have changed his prescription with [facility]. Thank you!!  Judson Roch  ----- Message ----- From: Herschell Dimes, RN Sent: 06/09/2018  10:59 AM To: Cordelia Poche, RN, [name], MD, # Subject: blood in urine                                 Patient reported stopping his Xarelto today due to 3 instances over the last 2 days of blood in urine. I told him when to go to ER if worsens and he relt well for iron today, he mentioned if on his way out of clinic after discharge. Told him you would contact him on 06/12/18 because he plans to not restart his blood thinner until he hears from you.

## 2018-06-13 NOTE — Progress Notes (Signed)
Xarelto dose decreased to 10 mg PO daily. Patient can restart today. Has been on hold since 7/5 due to nose bleeds.

## 2018-06-16 ENCOUNTER — Encounter: Payer: Self-pay | Admitting: Physician Assistant

## 2018-06-16 ENCOUNTER — Other Ambulatory Visit: Payer: BLUE CROSS/BLUE SHIELD

## 2018-06-16 DIAGNOSIS — M1710 Unilateral primary osteoarthritis, unspecified knee: Secondary | ICD-10-CM

## 2018-06-16 DIAGNOSIS — G893 Neoplasm related pain (acute) (chronic): Secondary | ICD-10-CM

## 2018-06-16 DIAGNOSIS — C7951 Secondary malignant neoplasm of bone: Secondary | ICD-10-CM

## 2018-06-16 MED ORDER — TRAMADOL HCL 50 MG PO TABS: 100.0000 mg | ORAL_TABLET | Freq: Every evening | ORAL | 2 refills | Status: DC | PRN

## 2018-06-20 ENCOUNTER — Ambulatory Visit: Payer: Self-pay | Admitting: Radiation Oncology

## 2018-06-21 ENCOUNTER — Other Ambulatory Visit: Payer: Self-pay

## 2018-06-21 ENCOUNTER — Inpatient Hospital Stay: Payer: BLUE CROSS/BLUE SHIELD

## 2018-06-21 ENCOUNTER — Encounter: Payer: Self-pay | Admitting: Hematology & Oncology

## 2018-06-21 ENCOUNTER — Other Ambulatory Visit: Payer: Self-pay | Admitting: Family

## 2018-06-21 ENCOUNTER — Inpatient Hospital Stay (HOSPITAL_BASED_OUTPATIENT_CLINIC_OR_DEPARTMENT_OTHER): Payer: BLUE CROSS/BLUE SHIELD | Admitting: Hematology & Oncology

## 2018-06-21 VITALS — BP 117/68 | HR 79 | Temp 98.5°F | Resp 17 | Wt 214.0 lb

## 2018-06-21 DIAGNOSIS — C649 Malignant neoplasm of unspecified kidney, except renal pelvis: Secondary | ICD-10-CM | POA: Diagnosis not present

## 2018-06-21 DIAGNOSIS — C641 Malignant neoplasm of right kidney, except renal pelvis: Secondary | ICD-10-CM

## 2018-06-21 DIAGNOSIS — C7951 Secondary malignant neoplasm of bone: Secondary | ICD-10-CM | POA: Diagnosis not present

## 2018-06-21 DIAGNOSIS — D500 Iron deficiency anemia secondary to blood loss (chronic): Secondary | ICD-10-CM

## 2018-06-21 DIAGNOSIS — R319 Hematuria, unspecified: Secondary | ICD-10-CM

## 2018-06-21 DIAGNOSIS — I82C11 Acute embolism and thrombosis of right internal jugular vein: Secondary | ICD-10-CM

## 2018-06-21 DIAGNOSIS — Z95828 Presence of other vascular implants and grafts: Secondary | ICD-10-CM

## 2018-06-21 DIAGNOSIS — I82890 Acute embolism and thrombosis of other specified veins: Secondary | ICD-10-CM

## 2018-06-21 DIAGNOSIS — K648 Other hemorrhoids: Secondary | ICD-10-CM

## 2018-06-21 DIAGNOSIS — Z7901 Long term (current) use of anticoagulants: Secondary | ICD-10-CM

## 2018-06-21 LAB — CBC WITH DIFFERENTIAL (CANCER CENTER ONLY)
BASOS PCT: 2 %
Basophils Absolute: 0.1 10*3/uL (ref 0.0–0.1)
EOS ABS: 0.2 10*3/uL (ref 0.0–0.5)
EOS PCT: 6 %
HCT: 38.3 % — ABNORMAL LOW (ref 38.7–49.9)
HEMOGLOBIN: 12.1 g/dL — AB (ref 13.0–17.1)
Lymphocytes Relative: 39 %
Lymphs Abs: 1.3 10*3/uL (ref 0.9–3.3)
MCH: 25.4 pg — AB (ref 28.0–33.4)
MCHC: 31.6 g/dL — AB (ref 32.0–35.9)
MCV: 80.5 fL — ABNORMAL LOW (ref 82.0–98.0)
Monocytes Absolute: 0.3 10*3/uL (ref 0.1–0.9)
Monocytes Relative: 10 %
NEUTROS ABS: 1.4 10*3/uL — AB (ref 1.5–6.5)
NEUTROS PCT: 43 %
PLATELETS: 173 10*3/uL (ref 145–400)
RBC: 4.76 MIL/uL (ref 4.20–5.70)
RDW: 14.3 % (ref 11.1–15.7)
WBC Count: 3.3 10*3/uL — ABNORMAL LOW (ref 4.0–10.0)

## 2018-06-21 LAB — CMP (CANCER CENTER ONLY)
ALBUMIN: 2.6 g/dL — AB (ref 3.5–5.0)
ALT: 33 U/L (ref 10–47)
AST: 34 U/L (ref 11–38)
Alkaline Phosphatase: 459 U/L — ABNORMAL HIGH (ref 26–84)
Anion gap: 7 (ref 5–15)
BUN: 10 mg/dL (ref 7–22)
CHLORIDE: 100 mmol/L (ref 98–108)
CO2: 30 mmol/L (ref 18–33)
CREATININE: 0.7 mg/dL (ref 0.60–1.20)
Calcium: 9.1 mg/dL (ref 8.0–10.3)
GLUCOSE: 220 mg/dL — AB (ref 73–118)
POTASSIUM: 3.7 mmol/L (ref 3.3–4.7)
SODIUM: 137 mmol/L (ref 128–145)
Total Bilirubin: 0.5 mg/dL (ref 0.2–1.6)
Total Protein: 7.6 g/dL (ref 6.4–8.1)

## 2018-06-21 LAB — IRON AND TIBC
Iron: 30 ug/dL — ABNORMAL LOW (ref 42–163)
SATURATION RATIOS: 15 % — AB (ref 42–163)
TIBC: 193 ug/dL — AB (ref 202–409)
UIBC: 163 ug/dL

## 2018-06-21 LAB — FERRITIN: Ferritin: 6077 ng/mL — ABNORMAL HIGH (ref 24–336)

## 2018-06-21 LAB — LACTATE DEHYDROGENASE: LDH: 267 U/L — ABNORMAL HIGH (ref 98–192)

## 2018-06-21 MED ORDER — RIVAROXABAN 10 MG PO TABS: 10.0000 mg | ORAL_TABLET | Freq: Every day | ORAL | 3 refills | Status: DC

## 2018-06-21 MED ORDER — SODIUM CHLORIDE 0.9% FLUSH
10.0000 mL | INTRAVENOUS | Status: DC | PRN
  Administered 2018-06-21: 10 mL via INTRAVENOUS
  Filled 2018-06-21: qty 10

## 2018-06-21 MED ORDER — HEPARIN SOD (PORK) LOCK FLUSH 100 UNIT/ML IV SOLN
500.0000 [IU] | Freq: Once | INTRAVENOUS | Status: AC
  Administered 2018-06-21: 500 [IU] via INTRAVENOUS
  Filled 2018-06-21: qty 5

## 2018-06-21 NOTE — Progress Notes (Signed)
Hematology and Oncology Follow Up Visit  Alexander Fry [id number] 1961-03-09 57 y.o. 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, pulmonary, liver, lymph node and bone metastasis Thrombus in the RIGHT Internal Jugular Vein Iron def anemia due to hematuria  Past Therapy:  Votrient 800 mg by mouth - stopped on 04/15/2017 due to hepatotoxicity  Cabometyx 40mg  po q day - start 06/22/2017 Radiation therapy to the brain and spine Cabometyx 40 mg by mouth daily - d/c on 01/24/2018  Current Therapy:   Xgeva 120 mg subcutaneous every 3 months - on hold Nivolumab/Ipilimumab - s/p cycle #4 -- d/c on 04/24/2018 Lenvima/Afinitor (18mg  q day/5 mg q day) -- started on 06/07/2018 Xarelto 10 mg po q day IV Feraheme as indicated   Interim History: Alexander Fry is back for follow-up.  He actually did see Dr. Tasia Catchings recently at [facility].  He wanted to see Dr. Tasia Catchings for a second opinion regarding treatment for his renal cell carcinoma.  Dr. Tasia Catchings agreed with the Lenvima/Afinitor combination.  Dr. Tasia Catchings also recommended another biopsy of the mass in his right kidney so that we can send it off for molecular analysis.  This certainly sounds like a good idea.  We will try to get this set up for next week.  He was having some problems with urinary blood.  He has some hematuria.  We held his Xarelto.  He now is on 10 mg daily of Xarelto.    He does have some stiffness in his joints.  He has not had any diarrhea.    He is still doing a little bit of work.  He really enjoys what he does.  He is had no problems with pain.  He is on Ultram which is helping.  Overall, his performance status is ECOG 1.  Medications:  Allergies as of 06/21/2018      Reactions   Feraheme [ferumoxytol] Anaphylaxis   Flush       Medication List        Accurate as of 06/21/18  5:16 PM. Always use your most recent med list.          acetaminophen 650 MG CR  tablet Commonly known as:  TYLENOL Take 2 tablets (1,300 mg total) by mouth every 8 (eight) hours as needed for pain.   dexamethasone 0.5 MG/5ML solution Commonly known as:  DECADRON Take 10 mLs (1 mg total) by mouth 4 (four) times daily. Swish in mouth for 2 minutes and spit out. Avoid eating/drinking for 1hr after rinse   everolimus 5 MG tablet Commonly known as:  AFINITOR Take 1 tablet (5 mg total) by mouth daily.   fexofenadine 60 MG tablet Commonly known as:  ALLEGRA Take by mouth daily.   lenvatinib 18 mg daily dose 10 MG & 2 x 4 MG capsule Commonly known as:  LENVIMA 18 MG DAILY DOSE Take 18 mg by mouth daily.   rivaroxaban 10 MG Tabs tablet Commonly known as:  XARELTO Take 1 tablet (10 mg total) by mouth daily with supper.   traMADol 50 MG tablet Commonly known as:  ULTRAM Take 2 tablets (100 mg total) by mouth at bedtime as needed for moderate pain.       Allergies:  Allergies  Allergen Reactions  . Feraheme [Ferumoxytol] Anaphylaxis    Flush     Past Medical History, Surgical history, Social history, and Family History were reviewed and updated.  Review of Systems: Review of Systems  Constitutional:  Negative.   HENT: Negative.   Eyes: Negative.   Respiratory: Negative.   Cardiovascular: Negative.   Gastrointestinal: Negative.   Genitourinary: Negative.   Musculoskeletal: Positive for joint pain.  Skin: Negative.   Neurological: Negative.   Endo/Heme/Allergies: Negative.   Psychiatric/Behavioral: Negative.      Physical Exam:  weight is 214 lb (97.1 kg). His oral temperature is 98.5 F (36.9 C). His blood pressure is 117/68 and his pulse is 79. His respiration is 17 and oxygen saturation is 98%.   Wt Readings from Last 3 Encounters:  06/21/18 214 lb (97.1 kg)  05/23/18 227 lb (103 kg)  05/19/18 231 lb 12.8 oz (105.1 kg)    Physical Exam  Constitutional: He is oriented to person, place, and time.  HENT:  Head: Normocephalic and atraumatic.   Mouth/Throat: Oropharynx is clear and moist.  Eyes: Pupils are equal, round, and reactive to light. EOM are normal.  Neck: Normal range of motion.  Cardiovascular: Normal rate, regular rhythm and normal heart sounds.  Pulmonary/Chest: Effort normal and breath sounds normal.  Abdominal: Soft. Bowel sounds are normal.  Musculoskeletal: Normal range of motion. He exhibits no edema, tenderness or deformity.  Lymphadenopathy:    He has no cervical adenopathy.  Neurological: He is alert and oriented to person, place, and time.  Skin: Skin is warm and dry. No rash noted. No erythema.  Psychiatric: He has a normal mood and affect. His behavior is normal. Judgment and thought content normal.  Vitals reviewed.    Lab Results  Component Value Date   WBC 3.3 (L) 06/21/2018   HGB 12.1 (L) 06/21/2018   HCT 38.3 (L) 06/21/2018   MCV 80.5 (L) 06/21/2018   PLT 173 06/21/2018   Lab Results  Component Value Date   FERRITIN 6,077 (H) 06/21/2018   IRON 30 (L) 06/21/2018   TIBC 193 (L) 06/21/2018   UIBC 163 06/21/2018   IRONPCTSAT 15 (L) 06/21/2018   Lab Results  Component Value Date   RBC 4.76 06/21/2018   No results found for: KPAFRELGTCHN, LAMBDASER, KAPLAMBRATIO No results found for: IGGSERUM, IGA, IGMSERUM No results found for: Kathrynn Ducking, MSPIKE, SPEI   Chemistry      Component Value Date/Time   NA 137 06/21/2018 0843   NA 147 (H) 11/11/2017 1337   NA 141 10/13/2017 1118   K 3.7 06/21/2018 0843   K 3.9 11/11/2017 1337   K 3.6 10/13/2017 1118   CL 100 06/21/2018 0843   CL 107 11/11/2017 1337   CO2 30 06/21/2018 0843   CO2 28 11/11/2017 1337   CO2 25 10/13/2017 1118   BUN 10 06/21/2018 0843   BUN 12 11/11/2017 1337   BUN 9.1 10/13/2017 1118   CREATININE 0.70 06/21/2018 0843   CREATININE 0.9 11/11/2017 1337   CREATININE 0.7 10/13/2017 1118      Component Value Date/Time   CALCIUM 9.1 06/21/2018 0843   CALCIUM 9.2 11/11/2017  1337   CALCIUM 9.2 10/13/2017 1118   ALKPHOS 459 (H) 06/21/2018 0843   ALKPHOS 166 (H) 11/11/2017 1337   ALKPHOS 181 (H) 10/13/2017 1118   AST 34 06/21/2018 0843   AST 38 (H) 10/13/2017 1118   ALT 33 06/21/2018 0843   ALT 38 11/11/2017 1337   ALT 34 10/13/2017 1118   BILITOT 0.5 06/21/2018 0843   BILITOT 1.02 10/13/2017 1118      Impression and Plan: Alexander Fry is a very pleasant 57 yo caucasian gentleman with metastatic  renal cell carcinoma.   For right now, we will continue him on the Lenvima/Afinitor.  Hopefully, we can get some "mileage" out of this combination.  He will be interesting to see what the kidney biopsy shows [name] with respect to any molecular changes.  I do want to get another ultrasound of his neck so we can see how the thrombus is doing.  He had been on Xgeva.  This we have on hold for right now.  I will see him back in 4 weeks.  [name], MD 7/17/20195:16 PM

## 2018-06-22 NOTE — Progress Notes (Signed)
Mr. Canterbury presents for follow up: Prior Radiation:    04/03/2018:  Right ilium, 8 Gy in 1 fraction for a total dose of 8 Gy  11/18/17: 20 Gy delivered to the brain in one fraction using Photon // 6FFF  06/07/17: Brainmetastasestreated to 20 Gy in 1 fraction;15 targets (orginially intended on 14 targets but an additional lesion was appreciated during RT planning) were treated using 6 Rapid Arc VMAT Beams to a prescription dose of 20 Gy.  02/25/17, 02/28/17, 03/02/17: L1 Spine target was treated using 3 Rapid Arc VMAT Beams to a prescription dose of 27 Gy in 3 fractions. And L4 Spine target was treated using 3 Rapid Arc VMAT Beams to a prescription dose of of 27 Gy in 3 fractions  02/21/17: Right Post Parietal 22mm target was treated using 4Dynamic Conformal Arcs to a prescription dose of 20 Gy. Left Post Parietal 3 mm target was treated using 3 circular collimator arcs to a prescription dose of 20 Gy. Right Occipital 50mm target was treated using 3Dynamic Conformal Arcs to a prescription dose of 20 Gy. Left Occipital 4 mm target was treated using 3Dynamic Conformal Arcs to a prescription dose of 20 Gy.  He had an MRI Brain on 06/23/18. He last saw Dr. Marin Olp on 06/21/18. He is taking Lenvima/ Afintor at this time. He saw Dr. Tasia Catchings at [facility] who agreed with the [place] and recommended a biopsy of his kidney to obtain molecular studies. This is scheduled for 06/29/18. He reports fatigue. He is losing weight due to no appetite and finding what tastes good to him. He reports joint pain and dull right lower back pain.   BP 137/90 (BP Location: Right Arm, Patient Position: Sitting, Cuff Size: Normal)   Pulse 77   Temp 99.1 F (37.3 C) (Oral)   Resp 18   Ht 6' (1.829 m)   Wt 214 lb 3.2 oz (97.2 kg)   SpO2 99%   BMI 29.05 kg/m    Wt Readings from Last 3 Encounters:  06/27/18 214 lb 3.2 oz (97.2 kg)  06/21/18 214 lb (97.1 kg)  05/23/18 227 lb (103 kg)

## 2018-06-23 ENCOUNTER — Ambulatory Visit: Payer: Self-pay | Admitting: Radiation Oncology

## 2018-06-23 ENCOUNTER — Ambulatory Visit
Admission: RE | Admit: 2018-06-23 | Discharge: 2018-06-23 | Disposition: A | Discharge status: Home / Self Care | Payer: BLUE CROSS/BLUE SHIELD | Source: Ambulatory Visit | Attending: Radiation Oncology | Admitting: Radiation Oncology

## 2018-06-23 DIAGNOSIS — C7949 Secondary malignant neoplasm of other parts of nervous system: Principal | ICD-10-CM

## 2018-06-23 DIAGNOSIS — C7931 Secondary malignant neoplasm of brain: Secondary | ICD-10-CM

## 2018-06-23 MED ORDER — GADOBENATE DIMEGLUMINE 529 MG/ML IV SOLN
20.0000 mL | Freq: Once | INTRAVENOUS | Status: AC | PRN
  Administered 2018-06-23: 20 mL via INTRAVENOUS

## 2018-06-26 ENCOUNTER — Ambulatory Visit (HOSPITAL_BASED_OUTPATIENT_CLINIC_OR_DEPARTMENT_OTHER)
Admission: RE | Admit: 2018-06-26 | Discharge: 2018-06-26 | Disposition: A | Discharge status: Home / Self Care | Payer: BLUE CROSS/BLUE SHIELD | Source: Ambulatory Visit | Attending: Hematology & Oncology | Admitting: Hematology & Oncology

## 2018-06-26 DIAGNOSIS — C641 Malignant neoplasm of right kidney, except renal pelvis: Secondary | ICD-10-CM | POA: Diagnosis present

## 2018-06-26 DIAGNOSIS — I82C21 Chronic embolism and thrombosis of right internal jugular vein: Secondary | ICD-10-CM | POA: Insufficient documentation

## 2018-06-27 ENCOUNTER — Other Ambulatory Visit: Payer: Self-pay

## 2018-06-27 ENCOUNTER — Ambulatory Visit
Admission: RE | Admit: 2018-06-27 | Discharge: 2018-06-27 | Disposition: A | Discharge status: Home / Self Care | Payer: BLUE CROSS/BLUE SHIELD | Source: Ambulatory Visit | Attending: Radiation Oncology | Admitting: Radiation Oncology

## 2018-06-27 ENCOUNTER — Encounter: Payer: Self-pay | Admitting: Radiation Oncology

## 2018-06-27 VITALS — BP 137/90 | HR 77 | Temp 99.1°F | Resp 18 | Ht 72.0 in | Wt 214.2 lb

## 2018-06-27 DIAGNOSIS — C7800 Secondary malignant neoplasm of unspecified lung: Secondary | ICD-10-CM | POA: Diagnosis not present

## 2018-06-27 DIAGNOSIS — C649 Malignant neoplasm of unspecified kidney, except renal pelvis: Secondary | ICD-10-CM | POA: Insufficient documentation

## 2018-06-27 DIAGNOSIS — C7931 Secondary malignant neoplasm of brain: Secondary | ICD-10-CM | POA: Diagnosis not present

## 2018-06-27 DIAGNOSIS — C7951 Secondary malignant neoplasm of bone: Secondary | ICD-10-CM | POA: Diagnosis not present

## 2018-06-27 NOTE — Progress Notes (Signed)
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 Brain metastases ([place]) C79.31     CHIEF COMPLAINT: Here for follow-up of renal cancer   Narrative:  The patient returns today for routine follow-up and to discuss his recent imaging.   MRI head with and without contrast on 06/23/2018 shows stable to improved metastatic disease as evidenced by lack of new lesions and no progression of previously treated lesions. White matter changes likely represent a combination of small vessel disease and post treatment effect. No acute intracranial abnormality.    Denies new neurological issues.  Joint pain, appetite decrease, since starting Afinitor/ Lenvima.   ALLERGIES:  is allergic to feraheme [ferumoxytol].  Meds: Current Outpatient Medications  Medication Sig Dispense Refill  . acetaminophen (TYLENOL) 650 MG CR tablet Take 2 tablets (1,300 mg total) by mouth every 8 (eight) hours as needed for pain. (Patient taking differently: Take 650 mg by mouth every 8 (eight) hours as needed for pain. ) 90 tablet 3  . dexamethasone (DECADRON) 0.5 MG/5ML solution Take 10 mLs (1 mg total) by mouth 4 (four) times daily. Swish in mouth for 2 minutes and spit out. Avoid eating/drinking for 1hr after rinse (Patient taking differently: Take 1 mg by mouth 4 (four) times daily as needed (for mouth sores). Swish in mouth for 2 minutes and spit out. Avoid eating/drinking for 1hr after rinse) 500 mL 4  . everolimus (AFINITOR) 5 MG tablet Take 1 tablet (5 mg total) by mouth daily. 30 tablet 4  . lenvatinib 18 mg daily dose (LENVIMA 18 MG DAILY DOSE) 10 & 4 (2) MG capsule Take 18 mg by mouth daily. 90 capsule 4  . rivaroxaban (XARELTO) 10 MG  TABS tablet Take 1 tablet (10 mg total) by mouth daily with supper. 30 tablet 3  . traMADol (ULTRAM) 50 MG tablet Take 2 tablets (100 mg total) by mouth at bedtime as needed for moderate pain. (Patient taking differently: Take 50 mg by mouth 2 (two) times daily as needed for moderate pain. ) 60 tablet 2   No current facility-administered medications for this encounter.     Physical Findings: The patient is in no acute distress. Patient is alert and oriented.  height is 6' (1.829 m) and weight is 214 lb 3.2 oz (97.2 kg). His oral temperature is 99.1 F (37.3 C). His blood pressure is 137/90 and his pulse is 77. His respiration is 18 and oxygen saturation is 99%. .   NAD, ambulatory, No focalities grossly  KPS: 80    ECOG 0, KPS 90  Lab Findings: Lab Results  Component Value Date   WBC 3.3 (L) 06/21/2018   HGB 12.1 (L) 06/21/2018   HCT 38.3 (L) 06/21/2018   MCV 80.5 (L) 06/21/2018   PLT 173 06/21/2018      CMP     Component Value Date/Time   NA 137 06/21/2018 0843   NA 147 (H) 11/11/2017 1337   NA 141 10/13/2017 1118   K 3.7 06/21/2018 0843   K 3.9 11/11/2017 1337   K 3.6 10/13/2017 1118   CL 100 06/21/2018 0843   CL 107 11/11/2017 1337   CO2 30 06/21/2018 0843  CO2 28 11/11/2017 1337   CO2 25 10/13/2017 1118   GLUCOSE 220 (H) 06/21/2018 0843   GLUCOSE 107 11/11/2017 1337   BUN 10 06/21/2018 0843   BUN 12 11/11/2017 1337   BUN 9.1 10/13/2017 1118   CREATININE 0.70 06/21/2018 0843   CREATININE 0.9 11/11/2017 1337   CREATININE 0.7 10/13/2017 1118   CALCIUM 9.1 06/21/2018 0843   CALCIUM 9.2 11/11/2017 1337   CALCIUM 9.2 10/13/2017 1118   PROT 7.6 06/21/2018 0843   PROT 6.6 11/11/2017 1337   PROT 7.4 10/13/2017 1118   ALBUMIN 2.6 (L) 06/21/2018 0843   ALBUMIN 3.3 11/11/2017 1337   ALBUMIN 3.9 10/13/2017 1118   AST 34 06/21/2018 0843   AST 38 (H) 10/13/2017 1118   ALT 33 06/21/2018 0843   ALT 38 11/11/2017 1337   ALT 34 10/13/2017 1118   ALKPHOS 459 (H)  06/21/2018 0843   ALKPHOS 166 (H) 11/11/2017 1337   ALKPHOS 181 (H) 10/13/2017 1118   BILITOT 0.5 06/21/2018 0843   BILITOT 1.02 10/13/2017 1118   GFRNONAA >60 12/21/2017 1124   GFRAA >60 12/21/2017 1124     Radiographic Findings: Mr Brain W Wo Contrast  Result Date: 06/23/2018 CLINICAL DATA:  SRS restaging of metastatic renal cell carcinoma Four targets treated 11/18/2017. Fifteen targets treated 06/07/2017. Four targets treated 02/21/2017. EXAM: MRI HEAD WITHOUT AND WITH CONTRAST TECHNIQUE: Multiplanar, multiecho pulse sequences of the brain and surrounding structures were obtained without and with intravenous contrast. CONTRAST:  50mL MULTIHANCE GADOBENATE DIMEGLUMINE 529 MG/ML IV SOLN Creatinine was obtained on site at [facility] at [address] Results: Creatinine 0.7 mg/dL. COMPARISON:  Multiple priors, most recent 03/27/2018. FINDINGS: BRAIN New Lesions: None. Larger lesions: None. Stable or Smaller lesions: RIGHT frontal, series 10, image 101 ovoid area T2 and FLAIR hyperintensity, significantly improved, no significant residual enhancement. Continued lack of enhancement of the more recently treated RIGHT cerebellar lesion, series 10, image 40. Scattered areas of susceptibility, as well as prolonged T2 and FLAIR signal, appear stable. Other Brain findings: Mild atrophy/ventriculomegaly. Both focal and confluent T2 and FLAIR hyperintensities, representing post treatment effect, as well as suspected small vessel disease. Vascular: Flow voids are maintained. Skull and upper cervical spine: Normal marrow signal. Sinuses/Orbits: No layering sinus fluid.  Negative orbits. Other: None. IMPRESSION: 1. Stable to improved metastatic disease as evidenced by lack of new lesions, and no progression of previously treated lesions. 2. White matter changes likely represent a combination of small vessel disease and post treatment effect. 3. No acute intracranial abnormality. Electronically Signed    By: Staci Righter M.D.   On: 06/23/2018 14:50   US Soft Tissue Head/neck  Result Date: 06/26/2018 CLINICAL DATA:  Metastatic renal cell carcinoma. Chronic right IJ thrombus. Right IJ port catheter. EXAM: ULTRASOUND OF HEAD/NECK SOFT TISSUES TECHNIQUE: Ultrasound examination of the head and neck soft tissues was performed in the area of clinical concern. COMPARISON:  03/31/2018 FINDINGS: Persistent occlusive thrombosis of the IJ vein which is noncompressible. No string sign on color Doppler. IMPRESSION: Chronic right IJ venous thrombosis. Electronically Signed   By: Lucrezia Europe M.D.   On: 06/26/2018 15:11    Impression/Plan:  Alexander Fry has a history of metastatic renal cell carcinoma to the lungs, abdomen, brain and bones.   The patient's MRI of his brain shows no evidence of active disease. Consensus at CNS tumor board is that he follow up in 3 month with a MRI of his brain. He can see Dr. Vertell Limber  at his next CNS follow up. We will continue to alternate follow-ups for his brain between neurosurgery and radiation oncology, as appropriate. He will undergo a right kidney biopsy in the near future for genetic analysis (to consider clinical trial options) and contiune systemic therapy for his metastatic disease.   Recommended nutritional shakes and high protein snacks.  Pt declines nutritionist referral. Recommend water exercises for joint pain.  In a face to face visit lasting over 10 minutes, greater than 50% of the time was spent on counseling and coordinating the patient's care.    _____________________________________   [name], MD  This document serves as a record of services personally performed by [name], MD. It was created on her behalf by Margit Banda, a trained medical scribe. The creation of this record is based on the scribe's personal observations and the provider's statements to them. This document has been checked and approved by the attending provider.

## 2018-06-28 ENCOUNTER — Other Ambulatory Visit: Payer: Self-pay | Admitting: Radiology

## 2018-06-29 ENCOUNTER — Ambulatory Visit (HOSPITAL_COMMUNITY)
Admission: RE | Admit: 2018-06-29 | Discharge: 2018-06-29 | Disposition: A | Discharge status: Home / Self Care | Payer: BLUE CROSS/BLUE SHIELD | Source: Ambulatory Visit | Attending: Hematology & Oncology | Admitting: Hematology & Oncology

## 2018-06-29 ENCOUNTER — Encounter (HOSPITAL_COMMUNITY): Payer: Self-pay

## 2018-06-29 ENCOUNTER — Other Ambulatory Visit: Payer: Self-pay

## 2018-06-29 DIAGNOSIS — Z79899 Other long term (current) drug therapy: Secondary | ICD-10-CM | POA: Diagnosis not present

## 2018-06-29 DIAGNOSIS — D509 Iron deficiency anemia, unspecified: Secondary | ICD-10-CM | POA: Diagnosis not present

## 2018-06-29 DIAGNOSIS — Z86718 Personal history of other venous thrombosis and embolism: Secondary | ICD-10-CM | POA: Insufficient documentation

## 2018-06-29 DIAGNOSIS — C641 Malignant neoplasm of right kidney, except renal pelvis: Secondary | ICD-10-CM

## 2018-06-29 DIAGNOSIS — N2889 Other specified disorders of kidney and ureter: Secondary | ICD-10-CM | POA: Insufficient documentation

## 2018-06-29 DIAGNOSIS — Z923 Personal history of irradiation: Secondary | ICD-10-CM | POA: Insufficient documentation

## 2018-06-29 DIAGNOSIS — R319 Hematuria, unspecified: Secondary | ICD-10-CM | POA: Diagnosis not present

## 2018-06-29 DIAGNOSIS — Z7901 Long term (current) use of anticoagulants: Secondary | ICD-10-CM | POA: Insufficient documentation

## 2018-06-29 LAB — CBC
HCT: 42.3 % (ref 39.0–52.0)
Hemoglobin: 12.7 g/dL — ABNORMAL LOW (ref 13.0–17.0)
MCH: 24.5 pg — ABNORMAL LOW (ref 26.0–34.0)
MCHC: 30 g/dL (ref 30.0–36.0)
MCV: 81.5 fL (ref 78.0–100.0)
Platelets: 210 10*3/uL (ref 150–400)
RBC: 5.19 MIL/uL (ref 4.22–5.81)
RDW: 14.2 % (ref 11.5–15.5)
WBC: 6 10*3/uL (ref 4.0–10.5)

## 2018-06-29 LAB — PROTIME-INR
INR: 0.98
PROTHROMBIN TIME: 12.9 s (ref 11.4–15.2)

## 2018-06-29 MED ORDER — FENTANYL CITRATE (PF) 100 MCG/2ML IJ SOLN
INTRAMUSCULAR | Status: AC
  Filled 2018-06-29: qty 4

## 2018-06-29 MED ORDER — FENTANYL CITRATE (PF) 100 MCG/2ML IJ SOLN
INTRAMUSCULAR | Status: AC | PRN
  Administered 2018-06-29 (×2): 50 ug via INTRAVENOUS

## 2018-06-29 MED ORDER — SODIUM CHLORIDE 0.9 % IV SOLN: INTRAVENOUS | Status: DC

## 2018-06-29 MED ORDER — MIDAZOLAM HCL 2 MG/2ML IJ SOLN
INTRAMUSCULAR | Status: AC | PRN
  Administered 2018-06-29 (×2): 1 mg via INTRAVENOUS

## 2018-06-29 MED ORDER — LIDOCAINE HCL 1 % IJ SOLN
INTRAMUSCULAR | Status: AC
  Filled 2018-06-29: qty 20

## 2018-06-29 MED ORDER — HYDROCODONE-ACETAMINOPHEN 5-325 MG PO TABS
1.0000 | ORAL_TABLET | ORAL | Status: DC | PRN
  Filled 2018-06-29: qty 2

## 2018-06-29 MED ORDER — GELATIN ABSORBABLE 12-7 MM EX MISC
CUTANEOUS | Status: AC
  Filled 2018-06-29: qty 1

## 2018-06-29 MED ORDER — MIDAZOLAM HCL 2 MG/2ML IJ SOLN
INTRAMUSCULAR | Status: AC
  Filled 2018-06-29: qty 4

## 2018-06-29 NOTE — Sedation Documentation (Signed)
Patient is resting comfortably. 

## 2018-06-29 NOTE — Procedures (Signed)
CT and US guided right renal mass biopsy.  4 cores obtained from mass.  Gelfoam slurry injected along biopsy tract.  Minimal blood loss and no immediate complication.

## 2018-06-29 NOTE — Sedation Documentation (Signed)
dsg to R low back intact

## 2018-06-29 NOTE — Consult Note (Signed)
Chief Complaint: Patient was seen in consultation today for CT guided right renal biopsy.  Referring Physician(s): Ennever,Peter R  Supervising Physician: Markus Daft  Patient Status: [facility] - Out-pt  History of Present Illness: Alexander Fry is a 57 y.o. male with a past medical history significant for metastatic renal cell carcinoma, right internal jugular vein thrombus and iron deficiency anemia due to hematuria. Right renal biopsy requested by Dr. Marin Olp for genetic analysis and systemic therapy.  Patient reports he feels fine except his mouth is very dry, last dose of Xarelto was 7/23 per patient, he reports he took a pain pill and tylenol at 6 am this morning. He denies any other complaints at this time.  Case reviewed by Dr. Anselm Pancoast who is amenable to proceed with CT guided renal biopsy today.   Past Medical History:  Diagnosis Date  . Bilateral primary osteoarthritis of knee 04/03/2018  . Goals of care, counseling/discussion 12/22/2016  . History of radiation therapy 02/11/2017   SRT right posterior frontal 12 mm target 20 Gy, Right anterior frontal 83mm 20 Gy  . History of radiation therapy 02/21/2017   SRT Right post parietal 48mm target 20 Gy, left Post parietal 81mm 20 Gy, Right Occipital 4 mm 20Gy, Left Occipital 4 mm 20 Gy  . History of radiation therapy 02/25/2017, 02/28/2017, 03/02/2017   SRT L1 spine 27 Gy 3 fractions, L4 Spine 27 Gy 3 fractions  . History of radiation therapy 06/07/2017   SRS- Brain  . History of radiation therapy 04/03/2018   Right ilium, 8 Gy in 1 fraction for a total dose of 8 Gy  . Inguinal hernia of left side without obstruction or gangrene   . Iron deficiency anemia due to chronic blood loss 05/23/2018  . Pneumonia    left lung  . Renal cell carcinoma, right ([place]) 01/05/2017  . 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 His vision has "spots" at times    Past Surgical History:  Procedure Laterality Date  . HERNIA REPAIR    .  INGUINAL HERNIA REPAIR Left   . IR FLUORO GUIDE PORT INSERTION RIGHT  02/06/2018  . IR US GUIDE VASC ACCESS RIGHT  02/06/2018    Allergies: Feraheme [ferumoxytol]  Medications: Prior to Admission medications   Medication Sig Start Date End Date Taking? Authorizing Provider  acetaminophen (TYLENOL) 650 MG CR tablet Take 2 tablets (1,300 mg total) by mouth every 8 (eight) hours as needed for pain. Patient taking differently: Take 650 mg by mouth every 8 (eight) hours as needed for pain.  04/03/18  Yes [name], PA-C  dexamethasone (DECADRON) 0.5 MG/5ML solution Take 10 mLs (1 mg total) by mouth 4 (four) times daily. Swish in mouth for 2 minutes and spit out. Avoid eating/drinking for 1hr after rinse Patient taking differently: Take 1 mg by mouth 4 (four) times daily as needed (for mouth sores). Swish in mouth for 2 minutes and spit out. Avoid eating/drinking for 1hr after rinse 06/02/18  Yes Ennever, Rudell Cobb, MD  everolimus (AFINITOR) 5 MG tablet Take 1 tablet (5 mg total) by mouth daily. 06/02/18  Yes [name], MD  lenvatinib 18 mg daily dose (LENVIMA 18 MG DAILY DOSE) 10 & 4 (2) MG capsule Take 18 mg by mouth daily. 06/02/18  Yes Ennever, Rudell Cobb, MD  rivaroxaban (XARELTO) 10 MG TABS tablet Take 1 tablet (10 mg total) by mouth daily with supper. 06/21/18  Yes [place], Holli Humbles, NP  traMADol (ULTRAM) 50 MG tablet Take 2 tablets (100 mg total) by mouth at bedtime as needed for moderate pain. Patient taking differently: Take 50 mg by mouth 2 (two) times daily as needed for moderate pain.  06/16/18  Yes [name], PA-C     Family History  Problem Relation Age of Onset  . Heart disease Mother   . Diabetes Mother   . Heart disease Father   . Alcohol abuse Brother     Social History   Socioeconomic History  . Marital status: Single    Spouse name: Not on file  . Number of children: Not on file  . Years of education: Not on file  . Highest education  level: Not on file  Occupational History  . Not on file  Social Needs  . Financial resource strain: Not on file  . Food insecurity:    Worry: Not on file    Inability: Not on file  . Transportation needs:    Medical: Not on file    Non-medical: Not on file  Tobacco Use  . Smoking status: Never Smoker  . Smokeless tobacco: Never Used  Substance and Sexual Activity  . Alcohol use: Yes    Comment: social use in the past  . Drug use: No  . Sexual activity: Not Currently  Lifestyle  . Physical activity:    Days per week: Not on file    Minutes per session: Not on file  . Stress: Not on file  Relationships  . Social connections:    Talks on phone: Not on file    Gets together: Not on file    Attends religious service: Not on file    Active member of club or organization: Not on file    Attends meetings of clubs or organizations: Not on file    Relationship status: Not on file  Other Topics Concern  . Not on file  Social History Narrative  . Not on file     Review of Systems: A 12 point ROS discussed and pertinent positives are indicated in the HPI above.  All other systems are negative.  Review of Systems  Constitutional: Positive for fatigue. Negative for fever.  Respiratory: Negative for cough and shortness of breath.   Cardiovascular: Negative for chest pain.  Gastrointestinal: Negative for abdominal pain, nausea and vomiting.  Skin: Negative for rash.  Neurological: Negative for weakness.    Vital Signs: BP 122/83   Pulse 81   Temp 98.2 F (36.8 C) (Oral)   Ht 6' (1.829 m)   Wt 205 lb (93 kg)   SpO2 100%   BMI 27.80 kg/m   Physical Exam  Constitutional: He is oriented to person, place, and time. No distress.  HENT:  Head: Normocephalic.  Cardiovascular: Normal rate, regular rhythm and normal heart sounds.  No murmur heard. Pulmonary/Chest: Effort normal and breath sounds normal. He has no wheezes.  Abdominal: Soft. Bowel sounds are normal. He exhibits  no distension. There is no tenderness.  Neurological: He is alert and oriented to person, place, and time.  Skin: Skin is warm and dry. He is not diaphoretic.  Psychiatric: He has a normal mood and affect. His behavior is normal. Judgment and thought content normal.  Nursing note and vitals reviewed.    MD Evaluation Airway: WNL Heart: WNL Abdomen: WNL Chest/ Lungs: WNL ASA  Classification: 2 Mallampati/Airway Score: Two   Imaging: Mr Jeri Cos EN Contrast  Result Date: 06/23/2018 CLINICAL DATA:  SRS  restaging of metastatic renal cell carcinoma Four targets treated 11/18/2017. Fifteen targets treated 06/07/2017. Four targets treated 02/21/2017. EXAM: MRI HEAD WITHOUT AND WITH CONTRAST TECHNIQUE: Multiplanar, multiecho pulse sequences of the brain and surrounding structures were obtained without and with intravenous contrast. CONTRAST:  67mL MULTIHANCE GADOBENATE DIMEGLUMINE 529 MG/ML IV SOLN Creatinine was obtained on site at [facility] at [address] Results: Creatinine 0.7 mg/dL. COMPARISON:  Multiple priors, most recent 03/27/2018. FINDINGS: BRAIN New Lesions: None. Larger lesions: None. Stable or Smaller lesions: RIGHT frontal, series 10, image 101 ovoid area T2 and FLAIR hyperintensity, significantly improved, no significant residual enhancement. Continued lack of enhancement of the more recently treated RIGHT cerebellar lesion, series 10, image 40. Scattered areas of susceptibility, as well as prolonged T2 and FLAIR signal, appear stable. Other Brain findings: Mild atrophy/ventriculomegaly. Both focal and confluent T2 and FLAIR hyperintensities, representing post treatment effect, as well as suspected small vessel disease. Vascular: Flow voids are maintained. Skull and upper cervical spine: Normal marrow signal. Sinuses/Orbits: No layering sinus fluid.  Negative orbits. Other: None. IMPRESSION: 1. Stable to improved metastatic disease as evidenced by lack of new lesions, and  no progression of previously treated lesions. 2. White matter changes likely represent a combination of small vessel disease and post treatment effect. 3. No acute intracranial abnormality. Electronically Signed   By: Staci Righter M.D.   On: 06/23/2018 14:50   US Soft Tissue Head/neck  Result Date: 06/26/2018 CLINICAL DATA:  Metastatic renal cell carcinoma. Chronic right IJ thrombus. Right IJ port catheter. EXAM: ULTRASOUND OF HEAD/NECK SOFT TISSUES TECHNIQUE: Ultrasound examination of the head and neck soft tissues was performed in the area of clinical concern. COMPARISON:  03/31/2018 FINDINGS: Persistent occlusive thrombosis of the IJ vein which is noncompressible. No string sign on color Doppler. IMPRESSION: Chronic right IJ venous thrombosis. Electronically Signed   By: Lucrezia Europe M.D.   On: 06/26/2018 15:11    Labs:  CBC: Recent Labs    05/02/18 0916 05/23/18 0806 06/21/18 0843 06/29/18 0932  WBC 7.6 7.6 3.3* 6.0  HGB 11.7* 11.2* 12.1* 12.7*  HCT 36.4* 35.4* 38.3* 42.3  PLT 355 361 173 210    COAGS: Recent Labs    02/06/18 0740  INR 0.94  APTT 24    BMP: Recent Labs    12/21/17 1124  04/11/18 0810 05/02/18 0916 05/23/18 0806 06/21/18 0843  NA 142   < > 139 138 143 137  K 3.9   < > 4.1 3.9 4.1 3.7  CL 107   < > 105 103 103 100  CO2 28   < > 29 30 30 30   GLUCOSE 74   < > 157* 120* 90 220*  BUN 10   < > 10 11 10 10   CALCIUM 9.0   < > 9.4 9.1 9.8 9.1  CREATININE 0.76   < > 0.90 0.80 0.90 0.70  GFRNONAA >60  --   --   --   --   --   GFRAA >60  --   --   --   --   --    < > = values in this interval not displayed.    LIVER FUNCTION TESTS: Recent Labs    04/11/18 0810 05/02/18 0916 05/23/18 0806 06/21/18 0843  BILITOT 0.7 0.6 0.6 0.5  AST 31 32 31 34  ALT 29 32 28 33  ALKPHOS 202* 242* 308* 459*  PROT 7.8 7.6 8.0 7.6  ALBUMIN 3.1* 3.1* 3.1* 2.6*  TUMOR MARKERS: No results for input(s): AFPTM, CEA, CA199, CHROMGRNA in the last 8760  hours.  Assessment and Plan:  CT guided right renal biopsy for evaluation of metastatic disease at request of Dr. Marin Olp. Dr. Anselm Pancoast will proceed with procedure today.  Risks and benefits discussed with the patient including, but not limited to bleeding, infection, damage to adjacent structures or low yield requiring additional tests.  All of the patient's questions were answered, patient is agreeable to proceed. Consent signed and in chart.   Thank you for this interesting consult.  I greatly enjoyed meeting Alexander Fry and look forward to participating in their care.  A copy of this report was sent to the requesting provider on this date.  Electronically Signed: Joaquim Nam, PA-C 06/29/2018, 10:06 AM   I spent a total of  40 Minutes in face to face in clinical consultation, greater than 50% of which was counseling/coordinating care for CT guided right renal biopsy.

## 2018-06-29 NOTE — Discharge Instructions (Signed)
**Note Alexander Fry-identified via Obfuscation** Percutaneous Kidney Biopsy, Care After °This sheet gives you information about how to care for yourself after your procedure. Your health care provider may also give you more specific instructions. If you have problems or questions, contact your health care provider. °What can I expect after the procedure? °After the procedure, it is common to have: °Pain or soreness near the area where the needle went through your skin (biopsy site). °Bright pink or cloudy urine for 24 hours after the procedure. ° °Follow these instructions at home: °Activity °Return to your normal activities as told by your health care provider. Ask your health care provider what activities are safe for you. °Do not drive for 24 hours if you were given a medicine to help you relax (sedative). °Do not lift anything that is heavier than 10 lb (4.5 kg) until your health care provider tells you that it is safe. °Avoid activities that take a lot of effort (are strenuous) until your health care provider approves. Most people will have to wait 2 weeks before returning to activities such as exercise or sexual intercourse. °General instructions °Take over-the-counter and prescription medicines only as told by your health care provider. °You may eat and drink after your procedure. Follow instructions from your health care provider about eating or drinking restrictions. °Check your biopsy site every day for signs of infection. Check for: °More redness, swelling, or pain. °More fluid or blood. °Warmth. °Pus or a bad smell. °Keep all follow-up visits as told by your health care provider. This is important. °Contact a health care provider if: °You have more redness, swelling, or pain around your biopsy site. °You have more fluid or blood coming from your biopsy site. °Your biopsy site feels warm to the touch. °You have pus or a bad smell coming from your biopsy site. °You have blood in your urine more than 24 hours after your procedure. °Get help right away  if: °You have dark red or brown urine. °You have a fever. °You are unable to urinate. °You feel burning when you urinate. °You feel faint. °You have severe pain in your abdomen or side. °This information is not intended to replace advice given to you by your health care provider. Make sure you discuss any questions you have with your health care provider. °Document Released: 07/25/2013 Document Revised: 09/03/2016 Document Reviewed: 09/03/2016 °Elsevier Interactive Patient Education © 2018 [employer] ° °

## 2018-07-07 ENCOUNTER — Encounter (HOSPITAL_COMMUNITY): Payer: Self-pay | Admitting: Hematology & Oncology

## 2018-07-07 ENCOUNTER — Encounter: Payer: Self-pay | Admitting: Physician Assistant

## 2018-07-12 ENCOUNTER — Encounter (HOSPITAL_COMMUNITY): Payer: Self-pay | Admitting: Hematology & Oncology

## 2018-07-20 ENCOUNTER — Inpatient Hospital Stay: Payer: BLUE CROSS/BLUE SHIELD

## 2018-07-20 ENCOUNTER — Inpatient Hospital Stay: Payer: BLUE CROSS/BLUE SHIELD | Attending: Family | Admitting: Hematology & Oncology

## 2018-07-20 ENCOUNTER — Encounter: Payer: Self-pay | Admitting: Hematology & Oncology

## 2018-07-20 ENCOUNTER — Other Ambulatory Visit: Payer: Self-pay

## 2018-07-20 VITALS — BP 135/72 | HR 70

## 2018-07-20 VITALS — BP 125/69 | HR 86 | Temp 98.9°F | Resp 18 | Wt 209.0 lb

## 2018-07-20 DIAGNOSIS — D500 Iron deficiency anemia secondary to blood loss (chronic): Secondary | ICD-10-CM

## 2018-07-20 DIAGNOSIS — I82C11 Acute embolism and thrombosis of right internal jugular vein: Secondary | ICD-10-CM | POA: Insufficient documentation

## 2018-07-20 DIAGNOSIS — E860 Dehydration: Secondary | ICD-10-CM | POA: Insufficient documentation

## 2018-07-20 DIAGNOSIS — Z95828 Presence of other vascular implants and grafts: Secondary | ICD-10-CM

## 2018-07-20 DIAGNOSIS — C641 Malignant neoplasm of right kidney, except renal pelvis: Secondary | ICD-10-CM

## 2018-07-20 DIAGNOSIS — C787 Secondary malignant neoplasm of liver and intrahepatic bile duct: Secondary | ICD-10-CM | POA: Insufficient documentation

## 2018-07-20 DIAGNOSIS — D509 Iron deficiency anemia, unspecified: Secondary | ICD-10-CM | POA: Insufficient documentation

## 2018-07-20 DIAGNOSIS — C7951 Secondary malignant neoplasm of bone: Secondary | ICD-10-CM | POA: Diagnosis not present

## 2018-07-20 DIAGNOSIS — C7931 Secondary malignant neoplasm of brain: Secondary | ICD-10-CM | POA: Insufficient documentation

## 2018-07-20 DIAGNOSIS — C7800 Secondary malignant neoplasm of unspecified lung: Secondary | ICD-10-CM | POA: Insufficient documentation

## 2018-07-20 DIAGNOSIS — Z7901 Long term (current) use of anticoagulants: Secondary | ICD-10-CM | POA: Diagnosis not present

## 2018-07-20 DIAGNOSIS — J329 Chronic sinusitis, unspecified: Secondary | ICD-10-CM | POA: Diagnosis not present

## 2018-07-20 DIAGNOSIS — E032 Hypothyroidism due to medicaments and other exogenous substances: Secondary | ICD-10-CM

## 2018-07-20 LAB — CBC WITH DIFFERENTIAL (CANCER CENTER ONLY)
BASOS ABS: 0.2 10*3/uL — AB (ref 0.0–0.1)
BASOS PCT: 4 %
EOS ABS: 0.3 10*3/uL (ref 0.0–0.5)
EOS PCT: 7 %
HCT: 36.1 % — ABNORMAL LOW (ref 38.7–49.9)
Hemoglobin: 11.5 g/dL — ABNORMAL LOW (ref 13.0–17.1)
Lymphocytes Relative: 33 %
Lymphs Abs: 1.5 10*3/uL (ref 0.9–3.3)
MCH: 24.5 pg — ABNORMAL LOW (ref 28.0–33.4)
MCHC: 31.9 g/dL — ABNORMAL LOW (ref 32.0–35.9)
MCV: 76.8 fL — ABNORMAL LOW (ref 82.0–98.0)
Monocytes Absolute: 0.4 10*3/uL (ref 0.1–0.9)
Monocytes Relative: 9 %
Neutro Abs: 2.1 10*3/uL (ref 1.5–6.5)
Neutrophils Relative %: 47 %
PLATELETS: 160 10*3/uL (ref 145–400)
RBC: 4.7 MIL/uL (ref 4.20–5.70)
RDW: 14.4 % (ref 11.1–15.7)
WBC Count: 4.4 10*3/uL (ref 4.0–10.0)

## 2018-07-20 LAB — CMP (CANCER CENTER ONLY)
ALBUMIN: 2.9 g/dL — AB (ref 3.5–5.0)
ALT: 26 U/L (ref 10–47)
AST: 31 U/L (ref 11–38)
Alkaline Phosphatase: 315 U/L — ABNORMAL HIGH (ref 26–84)
Anion gap: 6 (ref 5–15)
BUN: 13 mg/dL (ref 7–22)
CALCIUM: 9.2 mg/dL (ref 8.0–10.3)
CO2: 28 mmol/L (ref 18–33)
CREATININE: 0.5 mg/dL — AB (ref 0.60–1.20)
Chloride: 102 mmol/L (ref 98–108)
GLUCOSE: 227 mg/dL — AB (ref 73–118)
Potassium: 3.6 mmol/L (ref 3.3–4.7)
Sodium: 136 mmol/L (ref 128–145)
Total Bilirubin: 0.5 mg/dL (ref 0.2–1.6)
Total Protein: 7.1 g/dL (ref 6.4–8.1)

## 2018-07-20 LAB — RETICULOCYTES
RBC.: 4.63 MIL/uL (ref 4.20–5.82)
RETIC COUNT ABSOLUTE: 18.5 10*3/uL — AB (ref 34.8–93.9)
RETIC CT PCT: 0.4 % — AB (ref 0.8–1.8)

## 2018-07-20 LAB — TSH: TSH: 3.737 u[IU]/mL (ref 0.320–4.118)

## 2018-07-20 LAB — IRON AND TIBC
IRON: 28 ug/dL — AB (ref 42–163)
SATURATION RATIOS: 13 % — AB (ref 42–163)
TIBC: 219 ug/dL (ref 202–409)
UIBC: 191 ug/dL

## 2018-07-20 LAB — FERRITIN: Ferritin: 2742 ng/mL — ABNORMAL HIGH (ref 24–336)

## 2018-07-20 LAB — LACTATE DEHYDROGENASE: LDH: 206 U/L — ABNORMAL HIGH (ref 98–192)

## 2018-07-20 MED ORDER — DICLOFENAC SODIUM 1 % TD GEL: 2.0000 g | Freq: Four times a day (QID) | TRANSDERMAL | 3 refills | Status: DC

## 2018-07-20 MED ORDER — GLIMEPIRIDE 4 MG PO TABS: 4.0000 mg | ORAL_TABLET | Freq: Every day | ORAL | 4 refills | Status: AC

## 2018-07-20 MED ORDER — SODIUM CHLORIDE 0.9% FLUSH
10.0000 mL | INTRAVENOUS | Status: DC | PRN
  Administered 2018-07-20: 10 mL via INTRAVENOUS
  Filled 2018-07-20: qty 10

## 2018-07-20 MED ORDER — SODIUM CHLORIDE 0.9 % IV SOLN
INTRAVENOUS | Status: DC
  Administered 2018-07-20: 11:00:00 via INTRAVENOUS
  Filled 2018-07-20 (×2): qty 250

## 2018-07-20 MED ORDER — SODIUM CHLORIDE 0.9 % IV SOLN
750.0000 mg | Freq: Once | INTRAVENOUS | Status: AC
  Administered 2018-07-20: 750 mg via INTRAVENOUS
  Filled 2018-07-20: qty 15

## 2018-07-20 MED ORDER — HEPARIN SOD (PORK) LOCK FLUSH 100 UNIT/ML IV SOLN
500.0000 [IU] | Freq: Once | INTRAVENOUS | Status: AC
  Administered 2018-07-20: 500 [IU] via INTRAVENOUS
  Filled 2018-07-20: qty 5

## 2018-07-20 MED ORDER — METHYLPREDNISOLONE SODIUM SUCC 125 MG IJ SOLR
125.0000 mg | Freq: Once | INTRAMUSCULAR | Status: AC
  Administered 2018-07-20: 125 mg via INTRAVENOUS

## 2018-07-20 MED ORDER — METHYLPREDNISOLONE SODIUM SUCC 125 MG IJ SOLR
INTRAMUSCULAR | Status: AC
  Filled 2018-07-20: qty 2

## 2018-07-20 MED ORDER — SODIUM CHLORIDE 0.9 % IJ SOLN
10.0000 mL | Freq: Once | INTRAMUSCULAR | Status: AC
  Administered 2018-07-20: 10 mL
  Filled 2018-07-20: qty 10

## 2018-07-20 NOTE — Patient Instructions (Signed)
Implanted Port Home Guide An implanted port is a type of central line that is placed under the skin. Central lines are used to provide IV access when treatment or nutrition needs to be given through a person's veins. Implanted ports are used for long-term IV access. An implanted port may be placed because:  You need IV medicine that would be irritating to the small veins in your hands or arms.  You need long-term IV medicines, such as antibiotics.  You need IV nutrition for a long period.  You need frequent blood draws for lab tests.  You need dialysis.  Implanted ports are usually placed in the chest area, but they can also be placed in the upper arm, the abdomen, or the leg. An implanted port has two main parts:  Reservoir. The reservoir is round and will appear as a small, raised area under your skin. The reservoir is the part where a needle is inserted to give medicines or draw blood.  Catheter. The catheter is a thin, flexible tube that extends from the reservoir. The catheter is placed into a large vein. Medicine that is inserted into the reservoir goes into the catheter and then into the vein.  How will I care for my incision site? Do not get the incision site wet. Bathe or shower as directed by your health care provider. How is my port accessed? Special steps must be taken to access the port:  Before the port is accessed, a numbing cream can be placed on the skin. This helps numb the skin over the port site.  Your health care provider uses a sterile technique to access the port. ? Your health care provider must put on a mask and sterile gloves. ? The skin over your port is cleaned carefully with an antiseptic and allowed to dry. ? The port is gently pinched between sterile gloves, and a needle is inserted into the port.  Only "non-coring" port needles should be used to access the port. Once the port is accessed, a blood return should be checked. This helps ensure that the port  is in the vein and is not clogged.  If your port needs to remain accessed for a constant infusion, a clear (transparent) bandage will be placed over the needle site. The bandage and needle will need to be changed every week, or as directed by your health care provider.  Keep the bandage covering the needle clean and dry. Do not get it wet. Follow your health care provider's instructions on how to take a shower or bath while the port is accessed.  If your port does not need to stay accessed, no bandage is needed over the port.  What is flushing? Flushing helps keep the port from getting clogged. Follow your health care provider's instructions on how and when to flush the port. Ports are usually flushed with saline solution or a medicine called heparin. The need for flushing will depend on how the port is used.  If the port is used for intermittent medicines or blood draws, the port will need to be flushed: ? After medicines have been given. ? After blood has been drawn. ? As part of routine maintenance.  If a constant infusion is running, the port may not need to be flushed.  How long will my port stay implanted? The port can stay in for as long as your health care provider thinks it is needed. When it is time for the port to come out, surgery will be   done to remove it. The procedure is similar to the one performed when the port was put in. When should I seek immediate medical care? When you have an implanted port, you should seek immediate medical care if:  You notice a bad smell coming from the incision site.  You have swelling, redness, or drainage at the incision site.  You have more swelling or pain at the port site or the surrounding area.  You have a fever that is not controlled with medicine.  This information is not intended to replace advice given to you by your health care provider. Make sure you discuss any questions you have with your health care provider. Document  Released: 11/22/2005 Document Revised: 04/29/2016 Document Reviewed: 07/30/2013 Elsevier Interactive Patient Education  2017 Elsevier Inc.  

## 2018-07-20 NOTE — Progress Notes (Signed)
Hematology and Oncology Follow Up Visit  Alexander Fry [id number] 29-Aug-1961 57 y.o. 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, pulmonary, liver, lymph node and bone metastasis Thrombus in the RIGHT Internal Jugular Vein Iron def anemia due to hematuria  Past Therapy:  Votrient 800 mg by mouth - stopped on 04/15/2017 due to hepatotoxicity  Cabometyx 103m po q day - start 06/22/2017 Radiation therapy to the brain and spine Cabometyx 40 mg by mouth daily - d/c on 01/24/2018  Current Therapy:   Xgeva 120 mg subcutaneous every 3 months - on hold Nivolumab/Ipilimumab - s/p cycle #4 -- d/c on 04/24/2018 Lenvima/Afinitor (17mq day/5 mg q day) -- started on 06/07/2018 Xarelto 10 mg po q day IV Feraheme as indicated   Interim History: Alexander Fry back for follow-up.  We did go ahead and get a biopsy of the kidney.  This was done on 06/29/2018.  The pathology report ([id number] clear cell carcinoma of the right kidney.  More importantly, we sent of genetic analysis.  Unfortunately, there was no real mutation that we could target with FDA approved therapy.  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  With this, we might be able to use Piqray, which is only approved for breast cancer.  However, we might be able to get compassionate approval for this.  Alexander Fry has a lot of stiffness in his knees.  Alexander Fry really has a tough time walking.  Alexander Fry has arthritis.  I will try him on some Voltaren gel.  Alexander Fry does have some iron deficiency.  We will go ahead and give him some IV iron.  Alexander Fry has had no fever.  Alexander Fry has had no bleeding.  Alexander Fry has had no cough.  We are we repeated the ultrasound of his neck.  There is still the thrombus in the right internal jugular vein.  Alexander Fry is asymptomatic with this.  Alexander Fry is on Xarelto.  Alexander Fry will stay on Xarelto.  Alexander Fry says Alexander Fry had a MRI of the brain.  There is no detectable brain metastasis.  Overall, his performance status is ECOG  1.   Medications:  Allergies as of 07/20/2018      Reactions   Feraheme [ferumoxytol] Anaphylaxis, Other (See Comments)   Flush       Medication List        Accurate as of 07/20/18  9:53 AM. Always use your most recent med list.          acetaminophen 650 MG CR tablet Commonly known as:  TYLENOL Take 2 tablets (1,300 mg total) by mouth every 8 (eight) hours as needed for pain.   dexamethasone 0.5 MG/5ML solution Commonly known as:  DECADRON Take 10 mLs (1 mg total) by mouth 4 (four) times daily. Swish in mouth for 2 minutes and spit out. Avoid eating/drinking for 1hr after rinse   everolimus 5 MG tablet Commonly known as:  AFINITOR Take 1 tablet (5 mg total) by mouth daily.   lenvatinib 18 mg daily dose 10 MG & 2 x 4 MG capsule Commonly known as:  LENVIMA Take 18 mg by mouth daily.   rivaroxaban 10 MG Tabs tablet Commonly known as:  XARELTO Take 1 tablet (10 mg total) by mouth daily with supper.   traMADol 50 MG tablet Commonly known as:  ULTRAM Take 2 tablets (100 mg total) by mouth at bedtime as needed for moderate pain.       Allergies:  Allergies  Allergen  Reactions  . Feraheme [Ferumoxytol] Anaphylaxis and Other (See Comments)    Flush     Past Medical History, Surgical history, Social history, and Family History were reviewed and updated.  Review of Systems: Review of Systems  Constitutional: Negative.   HENT: Negative.   Eyes: Negative.   Respiratory: Negative.   Cardiovascular: Negative.   Gastrointestinal: Negative.   Genitourinary: Negative.   Musculoskeletal: Positive for joint pain.  Skin: Negative.   Neurological: Negative.   Endo/Heme/Allergies: Negative.   Psychiatric/Behavioral: Negative.      Physical Exam:  weight is 209 lb (94.8 kg). His oral temperature is 98.9 F (37.2 C). His blood pressure is 125/69 and his pulse is 86. His respiration is 18 and oxygen saturation is 98%.   Wt Readings from Last 3 Encounters:  07/20/18  209 lb (94.8 kg)  06/29/18 205 lb (93 kg)  06/27/18 214 lb 3.2 oz (97.2 kg)    Physical Exam  Constitutional: Alexander Fry is oriented to person, place, and time.  HENT:  Head: Normocephalic and atraumatic.  Mouth/Throat: Oropharynx is clear and moist.  Eyes: Pupils are equal, round, and reactive to light. EOM are normal.  Neck: Normal range of motion.  Cardiovascular: Normal rate, regular rhythm and normal heart sounds.  Pulmonary/Chest: Effort normal and breath sounds normal.  Abdominal: Soft. Bowel sounds are normal.  Musculoskeletal: Normal range of motion. Alexander Fry exhibits no edema, tenderness or deformity.  Lymphadenopathy:    Alexander Fry has no cervical adenopathy.  Neurological: Alexander Fry is alert and oriented to person, place, and time.  Skin: Skin is warm and dry. No rash noted. No erythema.  Psychiatric: Alexander Fry has a normal mood and affect. His behavior is normal. Judgment and thought content normal.  Vitals reviewed.    Lab Results  Component Value Date   WBC 4.4 07/20/2018   HGB 11.5 (L) 07/20/2018   HCT 36.1 (L) 07/20/2018   MCV 76.8 (L) 07/20/2018   PLT 160 07/20/2018   Lab Results  Component Value Date   FERRITIN 6,077 (H) 06/21/2018   IRON 30 (L) 06/21/2018   TIBC 193 (L) 06/21/2018   UIBC 163 06/21/2018   IRONPCTSAT 15 (L) 06/21/2018   Lab Results  Component Value Date   RBC 4.70 07/20/2018   No results found for: KPAFRELGTCHN, LAMBDASER, KAPLAMBRATIO No results found for: IGGSERUM, IGA, IGMSERUM No results found for: Kathrynn Ducking, MSPIKE, SPEI   Chemistry      Component Value Date/Time   NA 136 07/20/2018 0853   NA 147 (H) 11/11/2017 1337   NA 141 10/13/2017 1118   K 3.6 07/20/2018 0853   K 3.9 11/11/2017 1337   K 3.6 10/13/2017 1118   CL 102 07/20/2018 0853   CL 107 11/11/2017 1337   CO2 28 07/20/2018 0853   CO2 28 11/11/2017 1337   CO2 25 10/13/2017 1118   BUN 13 07/20/2018 0853   BUN 12 11/11/2017 1337   BUN 9.1  10/13/2017 1118   CREATININE 0.50 (L) 07/20/2018 0853   CREATININE 0.9 11/11/2017 1337   CREATININE 0.7 10/13/2017 1118      Component Value Date/Time   CALCIUM 9.2 07/20/2018 0853   CALCIUM 9.2 11/11/2017 1337   CALCIUM 9.2 10/13/2017 1118   ALKPHOS 315 (H) 07/20/2018 0853   ALKPHOS 166 (H) 11/11/2017 1337   ALKPHOS 181 (H) 10/13/2017 1118   AST 31 07/20/2018 0853   AST 38 (H) 10/13/2017 1118   ALT 26 07/20/2018 0853   ALT  38 11/11/2017 1337   ALT 34 10/13/2017 1118   BILITOT 0.5 07/20/2018 0853   BILITOT 1.02 10/13/2017 1118      Impression and Plan: Alexander Fry is a very pleasant 57 yo caucasian gentleman with metastatic renal cell carcinoma.   For right now, we will continue him on the Lenvima/Afinitor.  Hopefully, we can get some "mileage" out of this combination.  We will go ahead and get him set up with scans after the Labor Day holiday.  By then, Alexander Fry will be on the Lenvima/Afinitor combination for 2 months.  Her graph we still need to focus on his quality of life.  His blood sugar is quite high.  I will start him on Amaryl (4 mg p.o. daily) to help with the hyperglycemia.  I will plan to see him back in about another month.  I spent about 30 minutes with him.  All the time spent face-to-face with him counseling him going over his labs and getting treatments and scans set up for his next visit.   [name], MD 8/15/20199:53 AM

## 2018-07-20 NOTE — Patient Instructions (Signed)
Ferric carboxymaltose injection (Injectafer) What is this medicine? FERRIC CARBOXYMALTOSE (ferr-ik car-box-ee-mol-toes) is an iron complex. Iron is used to make healthy red blood cells, which carry oxygen and nutrients throughout the body. This medicine is used to treat anemia in people with chronic kidney disease or people who cannot take iron by mouth. This medicine may be used for other purposes; ask your health care provider or pharmacist if you have questions. COMMON BRAND NAME(S): Injectafer What should I tell my health care provider before I take this medicine? They need to know if you have any of these conditions: -anemia not caused by low iron levels -high levels of iron in the blood -liver disease -an unusual or allergic reaction to iron, other medicines, foods, dyes, or preservatives -pregnant or trying to get pregnant -breast-feeding How should I use this medicine? This medicine is for infusion into a vein. It is given by a health care professional in a hospital or clinic setting. Talk to your pediatrician regarding the use of this medicine in children. Special care may be needed. Overdosage: If you think you have taken too much of this medicine contact a poison control center or emergency room at once. NOTE: This medicine is only for you. Do not share this medicine with others. What if I miss a dose? It is important not to miss your dose. Call your doctor or health care professional if you are unable to keep an appointment. What may interact with this medicine? Do not take this medicine with any of the following medications: -deferoxamine -dimercaprol -other iron products This medicine may also interact with the following medications: -chloramphenicol -deferasirox This list may not describe all possible interactions. Give your health care provider a list of all the medicines, herbs, non-prescription drugs, or dietary supplements you use. Also tell them if you smoke, drink  alcohol, or use illegal drugs. Some items may interact with your medicine. What should I watch for while using this medicine? Visit your doctor or health care professional regularly. Tell your doctor if your symptoms do not start to get better or if they get worse. You may need blood work done while you are taking this medicine. You may need to follow a special diet. Talk to your doctor. Foods that contain iron include: whole grains/cereals, dried fruits, beans, or peas, leafy green vegetables, and organ meats (liver, kidney). What side effects may I notice from receiving this medicine? Side effects that you should report to your doctor or health care professional as soon as possible: -allergic reactions like skin rash, itching or hives, swelling of the face, lips, or tongue -breathing problems -changes in blood pressure -feeling faint or lightheaded, falls -flushing, sweating, or hot feelings Side effects that usually do not require medical attention (report to your doctor or health care professional if they continue or are bothersome): -changes in taste -constipation -dizziness -headache -nausea -pain, redness, or irritation at site where injected -vomiting This list may not describe all possible side effects. Call your doctor for medical advice about side effects. You may report side effects to FDA at 1-800-FDA-1088. Where should I keep my medicine? This drug is given in a hospital or clinic and will not be stored at home. NOTE: This sheet is a summary. It may not cover all possible information. If you have questions about this medicine, talk to your doctor, pharmacist, or health care provider.  2018 Elsevier/Gold Standard (2015-12-25 11:20:47)  

## 2018-07-24 ENCOUNTER — Encounter: Payer: Self-pay | Admitting: Hematology & Oncology

## 2018-07-27 ENCOUNTER — Telehealth: Payer: Self-pay | Admitting: *Deleted

## 2018-07-27 ENCOUNTER — Inpatient Hospital Stay: Payer: BLUE CROSS/BLUE SHIELD

## 2018-07-27 ENCOUNTER — Other Ambulatory Visit: Payer: Self-pay | Admitting: Family

## 2018-07-27 ENCOUNTER — Encounter: Payer: Self-pay | Admitting: Family

## 2018-07-27 ENCOUNTER — Inpatient Hospital Stay (HOSPITAL_BASED_OUTPATIENT_CLINIC_OR_DEPARTMENT_OTHER): Payer: BLUE CROSS/BLUE SHIELD | Admitting: Family

## 2018-07-27 ENCOUNTER — Other Ambulatory Visit: Payer: Self-pay

## 2018-07-27 VITALS — BP 137/80 | HR 81 | Temp 98.1°F | Resp 19 | Wt 207.1 lb

## 2018-07-27 DIAGNOSIS — Z7901 Long term (current) use of anticoagulants: Secondary | ICD-10-CM

## 2018-07-27 DIAGNOSIS — G8929 Other chronic pain: Secondary | ICD-10-CM

## 2018-07-27 DIAGNOSIS — C7931 Secondary malignant neoplasm of brain: Secondary | ICD-10-CM

## 2018-07-27 DIAGNOSIS — E860 Dehydration: Secondary | ICD-10-CM

## 2018-07-27 DIAGNOSIS — C641 Malignant neoplasm of right kidney, except renal pelvis: Secondary | ICD-10-CM

## 2018-07-27 DIAGNOSIS — I82C11 Acute embolism and thrombosis of right internal jugular vein: Secondary | ICD-10-CM

## 2018-07-27 DIAGNOSIS — M545 Low back pain: Secondary | ICD-10-CM

## 2018-07-27 DIAGNOSIS — C7951 Secondary malignant neoplasm of bone: Secondary | ICD-10-CM

## 2018-07-27 DIAGNOSIS — C7800 Secondary malignant neoplasm of unspecified lung: Secondary | ICD-10-CM | POA: Diagnosis not present

## 2018-07-27 DIAGNOSIS — C787 Secondary malignant neoplasm of liver and intrahepatic bile duct: Secondary | ICD-10-CM

## 2018-07-27 DIAGNOSIS — J0110 Acute frontal sinusitis, unspecified: Secondary | ICD-10-CM

## 2018-07-27 DIAGNOSIS — J329 Chronic sinusitis, unspecified: Secondary | ICD-10-CM

## 2018-07-27 DIAGNOSIS — R197 Diarrhea, unspecified: Secondary | ICD-10-CM

## 2018-07-27 DIAGNOSIS — K909 Intestinal malabsorption, unspecified: Secondary | ICD-10-CM

## 2018-07-27 DIAGNOSIS — D509 Iron deficiency anemia, unspecified: Secondary | ICD-10-CM

## 2018-07-27 DIAGNOSIS — K8689 Other specified diseases of pancreas: Secondary | ICD-10-CM

## 2018-07-27 LAB — CBC WITH DIFFERENTIAL (CANCER CENTER ONLY)
Band Neutrophils: 0 %
Basophils Absolute: 0 10*3/uL (ref 0.0–0.1)
Basophils Relative: 0 %
Blasts: 0 %
EOS PCT: 1 %
Eosinophils Absolute: 0.1 10*3/uL (ref 0.0–0.5)
HEMATOCRIT: 37.4 % — AB (ref 38.7–49.9)
Hemoglobin: 12 g/dL — ABNORMAL LOW (ref 13.0–17.1)
LYMPHS ABS: 1.6 10*3/uL (ref 0.9–3.3)
Lymphocytes Relative: 28 %
MCH: 24.2 pg — AB (ref 28.0–33.4)
MCHC: 32.1 g/dL (ref 32.0–35.9)
MCV: 75.6 fL — AB (ref 82.0–98.0)
Metamyelocytes Relative: 0 %
Monocytes Absolute: 0.5 10*3/uL (ref 0.1–0.9)
Monocytes Relative: 8 %
Myelocytes: 0 %
NEUTROS PCT: 63 %
NRBC: 0 /100{WBCs}
Neutro Abs: 3.5 10*3/uL (ref 1.5–6.5)
Other: 0 %
PROMYELOCYTES RELATIVE: 0 %
Platelet Count: 201 10*3/uL (ref 145–400)
RBC: 4.95 MIL/uL (ref 4.20–5.70)
RDW: 14.9 % (ref 11.1–15.7)
WBC: 5.7 10*3/uL (ref 4.0–10.0)

## 2018-07-27 LAB — CMP (CANCER CENTER ONLY)
ALT: 74 U/L — ABNORMAL HIGH (ref 10–47)
AST: 84 U/L — ABNORMAL HIGH (ref 11–38)
Albumin: 3.2 g/dL — ABNORMAL LOW (ref 3.5–5.0)
Alkaline Phosphatase: 489 U/L — ABNORMAL HIGH (ref 26–84)
Anion gap: 4 — ABNORMAL LOW (ref 5–15)
BUN: 9 mg/dL (ref 7–22)
CHLORIDE: 103 mmol/L (ref 98–108)
CO2: 30 mmol/L (ref 18–33)
CREATININE: 0.9 mg/dL (ref 0.60–1.20)
Calcium: 9.2 mg/dL (ref 8.0–10.3)
Glucose, Bld: 156 mg/dL — ABNORMAL HIGH (ref 73–118)
Potassium: 3.6 mmol/L (ref 3.3–4.7)
SODIUM: 137 mmol/L (ref 128–145)
Total Bilirubin: 0.7 mg/dL (ref 0.2–1.6)
Total Protein: 7.2 g/dL (ref 6.4–8.1)

## 2018-07-27 MED ORDER — PANCRELIPASE (LIP-PROT-AMYL) 36000-114000 UNITS PO CPEP: 36000.0000 [IU] | ORAL_CAPSULE | Freq: Three times a day (TID) | ORAL | 2 refills | Status: DC

## 2018-07-27 MED ORDER — FENTANYL 25 MCG/HR TD PT72: 25.0000 ug | MEDICATED_PATCH | TRANSDERMAL | 0 refills | Status: DC

## 2018-07-27 MED ORDER — SODIUM CHLORIDE 0.9 % IV SOLN
Freq: Once | INTRAVENOUS | Status: AC
  Administered 2018-07-27: 14:00:00 via INTRAVENOUS
  Filled 2018-07-27: qty 250

## 2018-07-27 MED ORDER — HYDROMORPHONE HCL 4 MG PO TABS: 4.0000 mg | ORAL_TABLET | Freq: Four times a day (QID) | ORAL | 0 refills | Status: DC | PRN

## 2018-07-27 MED ORDER — AMOXICILLIN-POT CLAVULANATE 875-125 MG PO TABS: 1.0000 | ORAL_TABLET | Freq: Two times a day (BID) | ORAL | 0 refills | Status: DC

## 2018-07-27 NOTE — Progress Notes (Signed)
Hematology and Oncology Follow Up Visit  Thedore Pickel [id number] 10/05/1961 57 y.o. 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, pulmonary, liver, lymph node and bone metastasis Thrombus in the RIGHT Internal Jugular Vein Iron def anemia due to hematuria  Past Therapy:  Votrient 800 mg by mouth - stopped on 04/15/2017 due to hepatotoxicity  Cabometyx 40mg  po q day - start 06/22/2017 Radiation therapy to the brain and spine Cabometyx 40 mg by mouth daily - d/c on 01/24/2018 Nivolumab/Ipilimumab - s/p cycle #4 -- d/c on 04/24/2018  Current Therapy:   Xgeva 120 mg subcutaneous every 3 months - on hold Lenvima/Afinitor (18mg  q day/5 mg q day) - started on 06/07/2018 Xarelto 10 mg po q day IV Feraheme as indicated   Interim History:  Mr. Ropp is here today with c/o what sounds like a syncopal episode. Last night he coughed, became nauseated (no vomiting) and then blacked out. He states that when he woke his arms were "jumping"/having spasms. He was not incontinent. No falls. He had sitting in bed.  He has no appetite and feels bloated when he does eat. He states that he is not hydrating well. His weight is down 2 lbs since his last visit.  He states that his cough is intermittent but he has green phlegm and lots of sinus drainage.  He has persistent lower back pain.  Since he started using the diclofenac cream for his knee and back pain he has had abdominal pain and upset.  Where he has not stayed hydrated he has noticed decreased urinary output.  No fever, chills, rash, dizziness, SOB, chest pain, palpitations or changes in bowel or bladder habits.  No swelling or tenderness in his extremities. The occasional numbness and tingling in his right lower extremity is unchanged.  No lymphadenopathy noted on his exam.  No episodes of bleeding, no bruising or petechiae.   ECOG Performance Status: 2 - Symptomatic, <50% confined to  bed  Medications:  Allergies as of 07/27/2018      Reactions   Feraheme [ferumoxytol] Anaphylaxis, Other (See Comments)   Flush       Medication List        Accurate as of 07/27/18  1:58 PM. Always use your most recent med list.          acetaminophen 650 MG CR tablet Commonly known as:  TYLENOL Take 2 tablets (1,300 mg total) by mouth every 8 (eight) hours as needed for pain.   dexamethasone 0.5 MG/5ML solution Commonly known as:  DECADRON Take 10 mLs (1 mg total) by mouth 4 (four) times daily. Swish in mouth for 2 minutes and spit out. Avoid eating/drinking for 1hr after rinse   diclofenac sodium 1 % Gel Commonly known as:  VOLTAREN Apply 2 g topically 4 (four) times daily.   everolimus 5 MG tablet Commonly known as:  AFINITOR Take 1 tablet (5 mg total) by mouth daily.   glimepiride 4 MG tablet Commonly known as:  AMARYL Take 1 tablet (4 mg total) by mouth daily with breakfast.   lenvatinib 18 mg daily dose 10 MG & 2 x 4 MG capsule Commonly known as:  LENVIMA Take 18 mg by mouth daily.   rivaroxaban 10 MG Tabs tablet Commonly known as:  XARELTO Take 1 tablet (10 mg total) by mouth daily with supper.   traMADol 50 MG tablet Commonly known as:  ULTRAM Take 2 tablets (100 mg total) by mouth at bedtime as  needed for moderate pain.       Allergies:  Allergies  Allergen Reactions  . Feraheme [Ferumoxytol] Anaphylaxis and Other (See Comments)    Flush     Past Medical History, Surgical history, Social history, and Family History were reviewed and updated.  Review of Systems: All other 10 point review of systems is negative.   Physical Exam:  weight is 207 lb 1.3 oz (93.9 kg). His oral temperature is 98.1 F (36.7 C). His blood pressure is 137/80 and his pulse is 81. His respiration is 19 and oxygen saturation is 100%.   Wt Readings from Last 3 Encounters:  07/27/18 207 lb 1.3 oz (93.9 kg)  07/20/18 209 lb (94.8 kg)  06/29/18 205 lb (93 kg)    Ocular:  Sclerae unicteric, pupils equal, round and reactive to light Ear-nose-throat: Oropharynx clear, dentition fair Lymphatic: No cervical, supraclavicular or axillary adenopathy Lungs no rales or rhonchi, good excursion bilaterally Heart regular rate and rhythm, no murmur appreciated Abd soft, nontender, positive bowel sounds, no liver or spleen tip palpated on exam, no fluid wave  MSK no focal spinal tenderness, no joint edema Neuro: non-focal, well-oriented, appropriate affect Breasts: Deferred  Lab Results  Component Value Date   WBC 5.7 07/27/2018   HGB 12.0 (L) 07/27/2018   HCT 37.4 (L) 07/27/2018   MCV 75.6 (L) 07/27/2018   PLT 201 07/27/2018   Lab Results  Component Value Date   FERRITIN 2,742 (H) 07/20/2018   IRON 28 (L) 07/20/2018   TIBC 219 07/20/2018   UIBC 191 07/20/2018   IRONPCTSAT 13 (L) 07/20/2018   Lab Results  Component Value Date   RETICCTPCT 0.4 (L) 07/20/2018   RBC 4.95 07/27/2018   No results found for: KPAFRELGTCHN, LAMBDASER, KAPLAMBRATIO No results found for: IGGSERUM, IGA, IGMSERUM No results found for: Kathrynn Ducking, MSPIKE, SPEI   Chemistry      Component Value Date/Time   NA 136 07/20/2018 0853   NA 147 (H) 11/11/2017 1337   NA 141 10/13/2017 1118   K 3.6 07/20/2018 0853   K 3.9 11/11/2017 1337   K 3.6 10/13/2017 1118   CL 102 07/20/2018 0853   CL 107 11/11/2017 1337   CO2 28 07/20/2018 0853   CO2 28 11/11/2017 1337   CO2 25 10/13/2017 1118   BUN 13 07/20/2018 0853   BUN 12 11/11/2017 1337   BUN 9.1 10/13/2017 1118   CREATININE 0.50 (L) 07/20/2018 0853   CREATININE 0.9 11/11/2017 1337   CREATININE 0.7 10/13/2017 1118      Component Value Date/Time   CALCIUM 9.2 07/20/2018 0853   CALCIUM 9.2 11/11/2017 1337   CALCIUM 9.2 10/13/2017 1118   ALKPHOS 315 (H) 07/20/2018 0853   ALKPHOS 166 (H) 11/11/2017 1337   ALKPHOS 181 (H) 10/13/2017 1118   AST 31 07/20/2018 0853   AST 38 (H) 10/13/2017  1118   ALT 26 07/20/2018 0853   ALT 38 11/11/2017 1337   ALT 34 10/13/2017 1118   BILITOT 0.5 07/20/2018 0853   BILITOT 1.02 10/13/2017 1118      Impression and Plan: Mr. Wages is a very pleasant 57 yo caucasian gentleman with metastatic renal cell carcinoma. He is here today with what sounds like a syncopal episode last night and sinusitis.  We will have stop the diclofenac and Ultram. He has been on a Duragesic patch along with PO dilaudid for breakthrough pain at home in the past and did well. We will try  this same regimen again.  He will also get fluids today for dehydration.  He will start Augmentin 875 PO BID for 7 days for the sinusitis.  He will start Creon for the diarrhea and see if this is helpful.  We will plan to see him back on Monday for follow-up and repeat lab work. He will contact our office with any questions or concerns or contact the on call service after hours.    Laverna Peace, NP 8/22/20191:58 PM

## 2018-07-27 NOTE — Patient Instructions (Signed)
Implanted Port Home Guide An implanted port is a type of central line that is placed under the skin. Central lines are used to provide IV access when treatment or nutrition needs to be given through a person's veins. Implanted ports are used for long-term IV access. An implanted port may be placed because:  You need IV medicine that would be irritating to the small veins in your hands or arms.  You need long-term IV medicines, such as antibiotics.  You need IV nutrition for a long period.  You need frequent blood draws for lab tests.  You need dialysis.  Implanted ports are usually placed in the chest area, but they can also be placed in the upper arm, the abdomen, or the leg. An implanted port has two main parts:  Reservoir. The reservoir is round and will appear as a small, raised area under your skin. The reservoir is the part where a needle is inserted to give medicines or draw blood.  Catheter. The catheter is a thin, flexible tube that extends from the reservoir. The catheter is placed into a large vein. Medicine that is inserted into the reservoir goes into the catheter and then into the vein.  How will I care for my incision site? Do not get the incision site wet. Bathe or shower as directed by your health care provider. How is my port accessed? Special steps must be taken to access the port:  Before the port is accessed, a numbing cream can be placed on the skin. This helps numb the skin over the port site.  Your health care provider uses a sterile technique to access the port. ? Your health care provider must put on a mask and sterile gloves. ? The skin over your port is cleaned carefully with an antiseptic and allowed to dry. ? The port is gently pinched between sterile gloves, and a needle is inserted into the port.  Only "non-coring" port needles should be used to access the port. Once the port is accessed, a blood return should be checked. This helps ensure that the port  is in the vein and is not clogged.  If your port needs to remain accessed for a constant infusion, a clear (transparent) bandage will be placed over the needle site. The bandage and needle will need to be changed every week, or as directed by your health care provider.  Keep the bandage covering the needle clean and dry. Do not get it wet. Follow your health care provider's instructions on how to take a shower or bath while the port is accessed.  If your port does not need to stay accessed, no bandage is needed over the port.  What is flushing? Flushing helps keep the port from getting clogged. Follow your health care provider's instructions on how and when to flush the port. Ports are usually flushed with saline solution or a medicine called heparin. The need for flushing will depend on how the port is used.  If the port is used for intermittent medicines or blood draws, the port will need to be flushed: ? After medicines have been given. ? After blood has been drawn. ? As part of routine maintenance.  If a constant infusion is running, the port may not need to be flushed.  How long will my port stay implanted? The port can stay in for as long as your health care provider thinks it is needed. When it is time for the port to come out, surgery will be   done to remove it. The procedure is similar to the one performed when the port was put in. When should I seek immediate medical care? When you have an implanted port, you should seek immediate medical care if:  You notice a bad smell coming from the incision site.  You have swelling, redness, or drainage at the incision site.  You have more swelling or pain at the port site or the surrounding area.  You have a fever that is not controlled with medicine.  This information is not intended to replace advice given to you by your health care provider. Make sure you discuss any questions you have with your health care provider. Document  Released: 11/22/2005 Document Revised: 04/29/2016 Document Reviewed: 07/30/2013 Elsevier Interactive Patient Education  2017 Elsevier Inc.  

## 2018-07-27 NOTE — Telephone Encounter (Signed)
Patient is c/o multiple symptoms. He states that he has not felt good since starting affinitor and lenvima. He has frequent abdominal cramping, aching and generalized nausea. He hasn't been taking anything for the nausea. He has taken some tramadol for pain without much decrease in level.   He also has some chills, unknown if he's had fevers and last night he woke up with his arms shaking. He wonders if he's had a seizure.  Reviewed all symptoms with Dr Marin Olp. We will schedule him an appointment to come in today for assessment. Patient aware of appointment time.

## 2018-07-27 NOTE — Patient Instructions (Signed)
Dehydration, Adult Dehydration is when there is not enough fluid or water in your body. This happens when you lose more fluids than you take in. Dehydration can range from mild to very bad. It should be treated right away to keep it from getting very bad. Symptoms of mild dehydration may include:  Thirst.  Dry lips.  Slightly dry mouth.  Dry, warm skin.  Dizziness. Symptoms of moderate dehydration may include:  Very dry mouth.  Muscle cramps.  Dark pee (urine). Pee may be the color of tea.  Your body making less pee.  Your eyes making fewer tears.  Heartbeat that is uneven or faster than normal (palpitations).  Headache.  Light-headedness, especially when you stand up from sitting.  Fainting (syncope). Symptoms of very bad dehydration may include:  Changes in skin, such as: ? Cold and clammy skin. ? Blotchy (mottled) or pale skin. ? Skin that does not quickly return to normal after being lightly pinched and let go (poor skin turgor).  Changes in body fluids, such as: ? Feeling very thirsty. ? Your eyes making fewer tears. ? Not sweating when body temperature is high, such as in hot weather. ? Your body making very little pee.  Changes in vital signs, such as: ? Weak pulse. ? Pulse that is more than 100 beats a minute when you are sitting still. ? Fast breathing. ? Low blood pressure.  Other changes, such as: ? Sunken eyes. ? Cold hands and feet. ? Confusion. ? Lack of energy (lethargy). ? Trouble waking up from sleep. ? Short-term weight loss. ? Unconsciousness. Follow these instructions at home:  If told by your doctor, drink an ORS: ? Make an ORS by using instructions on the package. ? Start by drinking small amounts, about  cup (120 mL) every 5-10 minutes. ? Slowly drink more until you have had the amount that your doctor said to have.  Drink enough clear fluid to keep your pee clear or pale yellow. If you were told to drink an ORS, finish the ORS  first, then start slowly drinking clear fluids. Drink fluids such as: ? Water. Do not drink only water by itself. Doing that can make the salt (sodium) level in your body get too low (hyponatremia). ? Ice chips. ? Fruit juice that you have added water to (diluted). ? Low-calorie sports drinks.  Avoid: ? Alcohol. ? Drinks that have a lot of sugar. These include high-calorie sports drinks, fruit juice that does not have water added, and soda. ? Caffeine. ? Foods that are greasy or have a lot of fat or sugar.  Take over-the-counter and prescription medicines only as told by your doctor.  Do not take salt tablets. Doing that can make the salt level in your body get too high (hypernatremia).  Eat foods that have minerals (electrolytes). Examples include bananas, oranges, potatoes, tomatoes, and spinach.  Keep all follow-up visits as told by your doctor. This is important. Contact a doctor if:  You have belly (abdominal) pain that: ? Gets worse. ? Stays in one area (localizes).  You have a rash.  You have a stiff neck.  You get angry or annoyed more easily than normal (irritability).  You are more sleepy than normal.  You have a harder time waking up than normal.  You feel: ? Weak. ? Dizzy. ? Very thirsty.  You have peed (urinated) only a small amount of very dark pee during 6-8 hours. Get help right away if:  You have symptoms of   very bad dehydration.  You cannot drink fluids without throwing up (vomiting).  Your symptoms get worse with treatment.  You have a fever.  You have a very bad headache.  You are throwing up or having watery poop (diarrhea) and it: ? Gets worse. ? Does not go away.  You have blood or something green (bile) in your throw-up.  You have blood in your poop (stool). This may cause poop to look black and tarry.  You have not peed in 6-8 hours.  You pass out (faint).  Your heart rate when you are sitting still is more than 100 beats a  minute.  You have trouble breathing. This information is not intended to replace advice given to you by your health care provider. Make sure you discuss any questions you have with your health care provider. Document Released: 09/18/2009 Document Revised: 06/11/2016 Document Reviewed: 01/16/2016 Elsevier Interactive Patient Education  2018 [employer]  

## 2018-07-31 ENCOUNTER — Telehealth: Payer: Self-pay | Admitting: *Deleted

## 2018-07-31 ENCOUNTER — Inpatient Hospital Stay (HOSPITAL_BASED_OUTPATIENT_CLINIC_OR_DEPARTMENT_OTHER): Payer: BLUE CROSS/BLUE SHIELD | Admitting: Hematology & Oncology

## 2018-07-31 ENCOUNTER — Inpatient Hospital Stay: Payer: BLUE CROSS/BLUE SHIELD

## 2018-07-31 ENCOUNTER — Other Ambulatory Visit: Payer: Self-pay

## 2018-07-31 ENCOUNTER — Encounter: Payer: Self-pay | Admitting: Hematology & Oncology

## 2018-07-31 VITALS — BP 147/81

## 2018-07-31 VITALS — BP 142/77 | HR 80 | Temp 98.4°F | Resp 20 | Wt 203.0 lb

## 2018-07-31 DIAGNOSIS — C787 Secondary malignant neoplasm of liver and intrahepatic bile duct: Secondary | ICD-10-CM

## 2018-07-31 DIAGNOSIS — C641 Malignant neoplasm of right kidney, except renal pelvis: Secondary | ICD-10-CM

## 2018-07-31 DIAGNOSIS — Z7901 Long term (current) use of anticoagulants: Secondary | ICD-10-CM

## 2018-07-31 DIAGNOSIS — C7800 Secondary malignant neoplasm of unspecified lung: Secondary | ICD-10-CM | POA: Diagnosis not present

## 2018-07-31 DIAGNOSIS — C7951 Secondary malignant neoplasm of bone: Secondary | ICD-10-CM | POA: Diagnosis not present

## 2018-07-31 DIAGNOSIS — I82C11 Acute embolism and thrombosis of right internal jugular vein: Secondary | ICD-10-CM

## 2018-07-31 DIAGNOSIS — E032 Hypothyroidism due to medicaments and other exogenous substances: Secondary | ICD-10-CM

## 2018-07-31 DIAGNOSIS — C7931 Secondary malignant neoplasm of brain: Secondary | ICD-10-CM | POA: Diagnosis not present

## 2018-07-31 DIAGNOSIS — D509 Iron deficiency anemia, unspecified: Secondary | ICD-10-CM

## 2018-07-31 DIAGNOSIS — D500 Iron deficiency anemia secondary to blood loss (chronic): Secondary | ICD-10-CM

## 2018-07-31 LAB — CBC WITH DIFFERENTIAL (CANCER CENTER ONLY)
Basophils Absolute: 0.1 10*3/uL (ref 0.0–0.1)
Basophils Relative: 2 %
EOS ABS: 0.1 10*3/uL (ref 0.0–0.5)
Eosinophils Relative: 1 %
HCT: 37.9 % — ABNORMAL LOW (ref 38.7–49.9)
Hemoglobin: 12.3 g/dL — ABNORMAL LOW (ref 13.0–17.1)
LYMPHS ABS: 0.9 10*3/uL (ref 0.9–3.3)
Lymphocytes Relative: 20 %
MCH: 24.3 pg — AB (ref 28.0–33.4)
MCHC: 32.5 g/dL (ref 32.0–35.9)
MCV: 74.9 fL — ABNORMAL LOW (ref 82.0–98.0)
MONOS PCT: 10 %
Monocytes Absolute: 0.5 10*3/uL (ref 0.1–0.9)
Neutro Abs: 3.2 10*3/uL (ref 1.5–6.5)
Neutrophils Relative %: 67 %
PLATELETS: 203 10*3/uL (ref 145–400)
RBC: 5.06 MIL/uL (ref 4.20–5.70)
RDW: 15.3 % (ref 11.1–15.7)
WBC: 4.8 10*3/uL (ref 4.0–10.0)

## 2018-07-31 LAB — CMP (CANCER CENTER ONLY)
ALT: 151 U/L — ABNORMAL HIGH (ref 10–47)
AST: 185 U/L (ref 11–38)
Albumin: 3.2 g/dL — ABNORMAL LOW (ref 3.5–5.0)
Alkaline Phosphatase: 733 U/L — ABNORMAL HIGH (ref 26–84)
Anion gap: 10 (ref 5–15)
BUN: 6 mg/dL — AB (ref 7–22)
CHLORIDE: 98 mmol/L (ref 98–108)
CO2: 27 mmol/L (ref 18–33)
Calcium: 9.6 mg/dL (ref 8.0–10.3)
Creatinine: 0.6 mg/dL (ref 0.60–1.20)
GLUCOSE: 115 mg/dL (ref 73–118)
POTASSIUM: 3.1 mmol/L — AB (ref 3.3–4.7)
SODIUM: 135 mmol/L (ref 128–145)
Total Bilirubin: 4.1 mg/dL (ref 0.2–1.6)
Total Protein: 7.3 g/dL (ref 6.4–8.1)

## 2018-07-31 MED ORDER — SODIUM CHLORIDE 0.9 % IV SOLN
INTRAVENOUS | Status: DC
  Administered 2018-07-31: 10:00:00 via INTRAVENOUS
  Filled 2018-07-31 (×2): qty 250

## 2018-07-31 MED ORDER — FAMOTIDINE IN NACL 20-0.9 MG/50ML-% IV SOLN
40.0000 mg | Freq: Once | INTRAVENOUS | Status: AC
  Administered 2018-07-31: 40 mg via INTRAVENOUS

## 2018-07-31 MED ORDER — SODIUM CHLORIDE 0.9% FLUSH
10.0000 mL | INTRAVENOUS | Status: DC | PRN
  Administered 2018-07-31: 10 mL via INTRAVENOUS
  Filled 2018-07-31: qty 10

## 2018-07-31 MED ORDER — METOCLOPRAMIDE HCL 10 MG PO TABS: 10.0000 mg | ORAL_TABLET | Freq: Three times a day (TID) | ORAL | 3 refills | Status: DC

## 2018-07-31 MED ORDER — METOCLOPRAMIDE HCL 5 MG/ML IJ SOLN
20.0000 mg | Freq: Once | INTRAVENOUS | Status: AC
  Administered 2018-07-31: 20 mg via INTRAVENOUS
  Filled 2018-07-31: qty 4

## 2018-07-31 MED ORDER — FAMOTIDINE IN NACL 20-0.9 MG/50ML-% IV SOLN
INTRAVENOUS | Status: AC
  Filled 2018-07-31: qty 100

## 2018-07-31 MED ORDER — HEPARIN SOD (PORK) LOCK FLUSH 100 UNIT/ML IV SOLN
500.0000 [IU] | Freq: Once | INTRAVENOUS | Status: AC
  Administered 2018-07-31: 500 [IU] via INTRAVENOUS
  Filled 2018-07-31: qty 5

## 2018-07-31 NOTE — Progress Notes (Signed)
Hematology and Oncology Follow Up Visit  Alexander Fry [id number] [birth year] 57 y.o. 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  He still is not feeling well.  He has been throwing up.  I am not sure what might be going on.  Might be the Voltaren gel that we put him on for his knee arthritis.  He stopped this.  He is losing weight.  Again he says he is not eating that much.  I told him just to stop the Lenvima/Afinitor.  It is possible that these might be causing problems.  He is on some IV fluids right now.  He has had no bleeding.  Had a normal bowel movement on Saturday.  He has had no fever.  He has had no rashes.  He has had no leg swelling.  There is been no double vision.  He has had no mouth sores.  Overall, his performance status is ECOG 1.   Medications:  Allergies as of 07/31/2018      Reactions   Feraheme [ferumoxytol] Anaphylaxis, Other (See Comments)   Flush       Medication List        Accurate as of 07/31/18  9:53 AM. Always use your most recent med list.          acetaminophen 650 MG CR tablet Commonly known as:  TYLENOL Take 2 tablets (1,300 mg total) by mouth every 8 (eight) hours as needed for pain.   amoxicillin-clavulanate 875-125 MG tablet Commonly known  as:  AUGMENTIN Take 1 tablet by mouth 2 (two) times daily.   dexamethasone 0.5 MG/5ML solution Commonly known as:  DECADRON Take 10 mLs (1 mg total) by mouth 4 (four) times daily. Swish in mouth for 2 minutes and spit out. Avoid eating/drinking for 1hr after rinse   diclofenac sodium 1 % Gel Commonly known as:  VOLTAREN Apply 2 g topically 4 (four) times daily.   everolimus 5 MG tablet Commonly known as:  AFINITOR Take 1 tablet (5 mg total) by mouth daily.   fentaNYL 25 MCG/HR patch Commonly known as:  DURAGESIC - dosed mcg/hr Place 1 patch (25 mcg total) onto the skin every 3 (three) days.   glimepiride 4 MG tablet Commonly known as:  AMARYL Take 1 tablet (4 mg total) by mouth daily with breakfast.   HYDROmorphone 4 MG tablet Commonly known as:  DILAUDID Take 1 tablet (4 mg total) by mouth every 6 (six) hours as needed for moderate pain or severe pain.   lenvatinib 18 mg daily dose 10 MG & 2 x 4 MG capsule Commonly known as:  LENVIMA Take 18 mg by mouth daily.   lipase/protease/amylase 36000 UNITS Cpep capsule Commonly known as:  CREON Take 1 capsule (36,000 Units total) by mouth 3 (three)  times daily before meals.   rivaroxaban 10 MG Tabs tablet Commonly known as:  XARELTO Take 1 tablet (10 mg total) by mouth daily with supper.   traMADol 50 MG tablet Commonly known as:  ULTRAM Take 2 tablets (100 mg total) by mouth at bedtime as needed for moderate pain.       Allergies:  Allergies  Allergen Reactions  . Feraheme [Ferumoxytol] Anaphylaxis and Other (See Comments)    Flush     Past Medical History, Surgical history, Social history, and Family History were reviewed and updated.  Review of Systems: Review of Systems  Constitutional: Negative.   HENT: Negative.   Eyes: Negative.   Respiratory: Negative.   Cardiovascular: Negative.   Gastrointestinal: Negative.   Genitourinary: Negative.   Musculoskeletal: Positive for joint pain.  Skin: Negative.     Neurological: Negative.   Endo/Heme/Allergies: Negative.   Psychiatric/Behavioral: Negative.      Physical Exam:  weight is 203 lb (92.1 kg). His oral temperature is 98.4 F (36.9 C). His blood pressure is 142/77 (abnormal) and his pulse is 80. His respiration is 20 and oxygen saturation is 99%.   Wt Readings from Last 3 Encounters:  07/31/18 203 lb (92.1 kg)  07/27/18 207 lb 1.3 oz (93.9 kg)  07/20/18 209 lb (94.8 kg)    Physical Exam  Constitutional: He is oriented to person, place, and time.  HENT:  Head: Normocephalic and atraumatic.  Mouth/Throat: Oropharynx is clear and moist.  Eyes: Pupils are equal, round, and reactive to light. EOM are normal.  Neck: Normal range of motion.  Cardiovascular: Normal rate, regular rhythm and normal heart sounds.  Pulmonary/Chest: Effort normal and breath sounds normal.  Abdominal: Soft. Bowel sounds are normal.  Musculoskeletal: Normal range of motion. He exhibits no edema, tenderness or deformity.  Lymphadenopathy:    He has no cervical adenopathy.  Neurological: He is alert and oriented to person, place, and time.  Skin: Skin is warm and dry. No rash noted. No erythema.  Psychiatric: He has a normal mood and affect. His behavior is normal. Judgment and thought content normal.  Vitals reviewed.    Lab Results  Component Value Date   WBC 4.8 07/31/2018   HGB 12.3 (L) 07/31/2018   HCT 37.9 (L) 07/31/2018   MCV 74.9 (L) 07/31/2018   PLT 203 07/31/2018   Lab Results  Component Value Date   FERRITIN 2,742 (H) 07/20/2018   IRON 28 (L) 07/20/2018   TIBC 219 07/20/2018   UIBC 191 07/20/2018   IRONPCTSAT 13 (L) 07/20/2018   Lab Results  Component Value Date   RETICCTPCT 0.4 (L) 07/20/2018   RBC 5.06 07/31/2018   No results found for: KPAFRELGTCHN, LAMBDASER, KAPLAMBRATIO No results found for: IGGSERUM, IGA, IGMSERUM No results found for: Odetta Pink, SPEI   Chemistry       Component Value Date/Time   NA 137 07/27/2018 1306   NA 147 (H) 11/11/2017 1337   NA 141 10/13/2017 1118   K 3.6 07/27/2018 1306   K 3.9 11/11/2017 1337   K 3.6 10/13/2017 1118   CL 103 07/27/2018 1306   CL 107 11/11/2017 1337   CO2 30 07/27/2018 1306   CO2 28 11/11/2017 1337   CO2 25 10/13/2017 1118   BUN 9 07/27/2018 1306   BUN 12 11/11/2017 1337   BUN 9.1 10/13/2017 1118   CREATININE 0.90 07/27/2018 1306   CREATININE 0.9 11/11/2017 1337   CREATININE 0.7 10/13/2017  1118      Component Value Date/Time   CALCIUM 9.2 07/27/2018 1306   CALCIUM 9.2 11/11/2017 1337   CALCIUM 9.2 10/13/2017 1118   ALKPHOS 489 (H) 07/27/2018 1306   ALKPHOS 166 (H) 11/11/2017 1337   ALKPHOS 181 (H) 10/13/2017 1118   AST 84 (H) 07/27/2018 1306   AST 38 (H) 10/13/2017 1118   ALT 74 (H) 07/27/2018 1306   ALT 38 11/11/2017 1337   ALT 34 10/13/2017 1118   BILITOT 0.7 07/27/2018 1306   BILITOT 1.02 10/13/2017 1118      Impression and Plan: Mr. Kiener is a very pleasant 57 yo caucasian gentleman with metastatic renal cell carcinoma.   Again, I am not sure exactly why he is feeling so poorly.  Hopefully, stopping the Afinitor/Lenvima will help.  Stopping the Voltaren gel hopefully help.  I will try to get a PET scan set up form this week so we can see how well he is doing.  I will plan to see him back for his regular appointment.   [name], MD 8/26/20199:53 AM

## 2018-07-31 NOTE — Telephone Encounter (Signed)
Critical Value AST 185 Total Bili 4.1 Dr Marin Olp notified. No orders at this time.

## 2018-07-31 NOTE — Progress Notes (Signed)
Dr Marin Olp aware of cmet results.  Dr. Marin Olp spoke with patient and instructed patient to hold Afinitor and Lenvima. MD instructions reviewed with pt prior to d/c to hold Afinitor and Lenvima.  Teach back done.  Patient verbalized an understanding to hold Afinitor and Lenvima.

## 2018-08-01 ENCOUNTER — Telehealth: Payer: Self-pay | Admitting: *Deleted

## 2018-08-01 ENCOUNTER — Encounter (HOSPITAL_COMMUNITY): Payer: Self-pay

## 2018-08-01 ENCOUNTER — Other Ambulatory Visit: Payer: Self-pay

## 2018-08-01 ENCOUNTER — Emergency Department (HOSPITAL_COMMUNITY): Payer: BLUE CROSS/BLUE SHIELD

## 2018-08-01 ENCOUNTER — Inpatient Hospital Stay (HOSPITAL_COMMUNITY)
Admission: EM | Admit: 2018-08-01 | Discharge: 2018-08-04 | DRG: 439 | Disposition: A | Discharge status: Home / Self Care | Payer: BLUE CROSS/BLUE SHIELD | Attending: Internal Medicine | Admitting: Internal Medicine

## 2018-08-01 ENCOUNTER — Inpatient Hospital Stay (HOSPITAL_COMMUNITY): Payer: BLUE CROSS/BLUE SHIELD

## 2018-08-01 DIAGNOSIS — G893 Neoplasm related pain (acute) (chronic): Secondary | ICD-10-CM | POA: Diagnosis not present

## 2018-08-01 DIAGNOSIS — K3189 Other diseases of stomach and duodenum: Secondary | ICD-10-CM | POA: Diagnosis present

## 2018-08-01 DIAGNOSIS — K269 Duodenal ulcer, unspecified as acute or chronic, without hemorrhage or perforation: Secondary | ICD-10-CM | POA: Diagnosis present

## 2018-08-01 DIAGNOSIS — Z7189 Other specified counseling: Secondary | ICD-10-CM | POA: Diagnosis not present

## 2018-08-01 DIAGNOSIS — R112 Nausea with vomiting, unspecified: Secondary | ICD-10-CM | POA: Diagnosis not present

## 2018-08-01 DIAGNOSIS — N401 Enlarged prostate with lower urinary tract symptoms: Secondary | ICD-10-CM | POA: Diagnosis present

## 2018-08-01 DIAGNOSIS — E876 Hypokalemia: Secondary | ICD-10-CM | POA: Diagnosis present

## 2018-08-01 DIAGNOSIS — K529 Noninfective gastroenteritis and colitis, unspecified: Secondary | ICD-10-CM | POA: Diagnosis not present

## 2018-08-01 DIAGNOSIS — E119 Type 2 diabetes mellitus without complications: Secondary | ICD-10-CM | POA: Diagnosis present

## 2018-08-01 DIAGNOSIS — E440 Moderate protein-calorie malnutrition: Secondary | ICD-10-CM

## 2018-08-01 DIAGNOSIS — K2980 Duodenitis without bleeding: Secondary | ICD-10-CM | POA: Diagnosis present

## 2018-08-01 DIAGNOSIS — R351 Nocturia: Secondary | ICD-10-CM | POA: Diagnosis present

## 2018-08-01 DIAGNOSIS — R7989 Other specified abnormal findings of blood chemistry: Secondary | ICD-10-CM | POA: Diagnosis present

## 2018-08-01 DIAGNOSIS — C649 Malignant neoplasm of unspecified kidney, except renal pelvis: Secondary | ICD-10-CM | POA: Diagnosis not present

## 2018-08-01 DIAGNOSIS — D500 Iron deficiency anemia secondary to blood loss (chronic): Secondary | ICD-10-CM | POA: Diagnosis present

## 2018-08-01 DIAGNOSIS — K520 Gastroenteritis and colitis due to radiation: Secondary | ICD-10-CM | POA: Diagnosis present

## 2018-08-01 DIAGNOSIS — K449 Diaphragmatic hernia without obstruction or gangrene: Secondary | ICD-10-CM | POA: Diagnosis present

## 2018-08-01 DIAGNOSIS — D649 Anemia, unspecified: Secondary | ICD-10-CM | POA: Diagnosis not present

## 2018-08-01 DIAGNOSIS — R109 Unspecified abdominal pain: Secondary | ICD-10-CM | POA: Diagnosis present

## 2018-08-01 DIAGNOSIS — E1169 Type 2 diabetes mellitus with other specified complication: Secondary | ICD-10-CM | POA: Diagnosis not present

## 2018-08-01 DIAGNOSIS — Z79891 Long term (current) use of opiate analgesic: Secondary | ICD-10-CM

## 2018-08-01 DIAGNOSIS — R945 Abnormal results of liver function studies: Secondary | ICD-10-CM | POA: Diagnosis not present

## 2018-08-01 DIAGNOSIS — K5903 Drug induced constipation: Secondary | ICD-10-CM | POA: Diagnosis not present

## 2018-08-01 DIAGNOSIS — K56609 Unspecified intestinal obstruction, unspecified as to partial versus complete obstruction: Secondary | ICD-10-CM | POA: Diagnosis not present

## 2018-08-01 DIAGNOSIS — R609 Edema, unspecified: Secondary | ICD-10-CM | POA: Diagnosis not present

## 2018-08-01 DIAGNOSIS — K567 Ileus, unspecified: Secondary | ICD-10-CM | POA: Diagnosis not present

## 2018-08-01 DIAGNOSIS — N281 Cyst of kidney, acquired: Secondary | ICD-10-CM | POA: Diagnosis present

## 2018-08-01 DIAGNOSIS — Z86718 Personal history of other venous thrombosis and embolism: Secondary | ICD-10-CM

## 2018-08-01 DIAGNOSIS — C7951 Secondary malignant neoplasm of bone: Secondary | ICD-10-CM | POA: Diagnosis present

## 2018-08-01 DIAGNOSIS — R740 Nonspecific elevation of levels of transaminase and lactic acid dehydrogenase [LDH]: Secondary | ICD-10-CM | POA: Diagnosis not present

## 2018-08-01 DIAGNOSIS — Z888 Allergy status to other drugs, medicaments and biological substances status: Secondary | ICD-10-CM | POA: Diagnosis not present

## 2018-08-01 DIAGNOSIS — C7931 Secondary malignant neoplasm of brain: Secondary | ICD-10-CM | POA: Diagnosis present

## 2018-08-01 DIAGNOSIS — K859 Acute pancreatitis without necrosis or infection, unspecified: Secondary | ICD-10-CM | POA: Diagnosis not present

## 2018-08-01 DIAGNOSIS — Z79899 Other long term (current) drug therapy: Secondary | ICD-10-CM

## 2018-08-01 DIAGNOSIS — E669 Obesity, unspecified: Secondary | ICD-10-CM | POA: Diagnosis not present

## 2018-08-01 DIAGNOSIS — E860 Dehydration: Secondary | ICD-10-CM | POA: Diagnosis not present

## 2018-08-01 DIAGNOSIS — Z7901 Long term (current) use of anticoagulants: Secondary | ICD-10-CM

## 2018-08-01 DIAGNOSIS — I82C21 Chronic embolism and thrombosis of right internal jugular vein: Secondary | ICD-10-CM | POA: Diagnosis not present

## 2018-08-01 DIAGNOSIS — Z515 Encounter for palliative care: Secondary | ICD-10-CM | POA: Diagnosis not present

## 2018-08-01 DIAGNOSIS — D1803 Hemangioma of intra-abdominal structures: Secondary | ICD-10-CM | POA: Diagnosis present

## 2018-08-01 DIAGNOSIS — C641 Malignant neoplasm of right kidney, except renal pelvis: Secondary | ICD-10-CM | POA: Diagnosis present

## 2018-08-01 DIAGNOSIS — K5900 Constipation, unspecified: Secondary | ICD-10-CM | POA: Diagnosis present

## 2018-08-01 DIAGNOSIS — R52 Pain, unspecified: Secondary | ICD-10-CM | POA: Diagnosis not present

## 2018-08-01 DIAGNOSIS — R7401 Elevation of levels of liver transaminase levels: Secondary | ICD-10-CM | POA: Diagnosis present

## 2018-08-01 DIAGNOSIS — R1084 Generalized abdominal pain: Secondary | ICD-10-CM | POA: Diagnosis not present

## 2018-08-01 DIAGNOSIS — Z6827 Body mass index (BMI) 27.0-27.9, adult: Secondary | ICD-10-CM | POA: Diagnosis not present

## 2018-08-01 DIAGNOSIS — C799 Secondary malignant neoplasm of unspecified site: Secondary | ICD-10-CM | POA: Diagnosis not present

## 2018-08-01 DIAGNOSIS — Z7984 Long term (current) use of oral hypoglycemic drugs: Secondary | ICD-10-CM | POA: Diagnosis not present

## 2018-08-01 DIAGNOSIS — Y842 Radiological procedure and radiotherapy as the cause of abnormal reaction of the patient, or of later complication, without mention of misadventure at the time of the procedure: Secondary | ICD-10-CM | POA: Diagnosis present

## 2018-08-01 DIAGNOSIS — K8500 Idiopathic acute pancreatitis without necrosis or infection: Secondary | ICD-10-CM | POA: Diagnosis not present

## 2018-08-01 DIAGNOSIS — Z85528 Personal history of other malignant neoplasm of kidney: Secondary | ICD-10-CM | POA: Diagnosis not present

## 2018-08-01 DIAGNOSIS — Z789 Other specified health status: Secondary | ICD-10-CM | POA: Diagnosis not present

## 2018-08-01 DIAGNOSIS — T402X5A Adverse effect of other opioids, initial encounter: Secondary | ICD-10-CM | POA: Diagnosis not present

## 2018-08-01 LAB — CBC
HEMATOCRIT: 47.4 % (ref 39.0–52.0)
HEMOGLOBIN: 15.8 g/dL (ref 13.0–17.0)
MCH: 24.8 pg — ABNORMAL LOW (ref 26.0–34.0)
MCHC: 33.3 g/dL (ref 30.0–36.0)
MCV: 74.4 fL — ABNORMAL LOW (ref 78.0–100.0)
Platelets: 165 10*3/uL (ref 150–400)
RBC: 6.37 MIL/uL — AB (ref 4.22–5.81)
RDW: 15.7 % — ABNORMAL HIGH (ref 11.5–15.5)
WBC: 4.3 10*3/uL (ref 4.0–10.5)

## 2018-08-01 LAB — COMPREHENSIVE METABOLIC PANEL
ALT: 183 U/L — ABNORMAL HIGH (ref 0–44)
ANION GAP: 14 (ref 5–15)
AST: 187 U/L — ABNORMAL HIGH (ref 15–41)
Albumin: 3.2 g/dL — ABNORMAL LOW (ref 3.5–5.0)
Alkaline Phosphatase: 802 U/L — ABNORMAL HIGH (ref 38–126)
BILIRUBIN TOTAL: 5.8 mg/dL — AB (ref 0.3–1.2)
BUN: 7 mg/dL (ref 6–20)
CO2: 27 mmol/L (ref 22–32)
Calcium: 9.3 mg/dL (ref 8.9–10.3)
Chloride: 99 mmol/L (ref 98–111)
Creatinine, Ser: 0.4 mg/dL — ABNORMAL LOW (ref 0.61–1.24)
GFR calc Af Amer: >60 mL/min (ref >60)
GFR calc non Af Amer: >60 mL/min (ref >60)
Glucose, Bld: 112 mg/dL — ABNORMAL HIGH (ref 70–99)
POTASSIUM: 3.2 mmol/L — AB (ref 3.5–5.1)
SODIUM: 140 mmol/L (ref 135–145)
TOTAL PROTEIN: 7.1 g/dL (ref 6.5–8.1)

## 2018-08-01 LAB — URINALYSIS, ROUTINE W REFLEX MICROSCOPIC
Glucose, UA: 50 mg/dL — AB
KETONES UR: 80 mg/dL — AB
NITRITE: NEGATIVE
PROTEIN: 30 mg/dL — AB
Specific Gravity, Urine: 1.021 (ref 1.005–1.030)
pH: 5 (ref 5.0–8.0)

## 2018-08-01 LAB — LIPASE, BLOOD: LIPASE: 121 U/L — AB (ref 11–51)

## 2018-08-01 MED ORDER — ONDANSETRON HCL 4 MG/2ML IJ SOLN
4.0000 mg | Freq: Four times a day (QID) | INTRAMUSCULAR | Status: DC | PRN
  Administered 2018-08-01 – 2018-08-02 (×2): 4 mg via INTRAVENOUS
  Filled 2018-08-01 (×2): qty 2

## 2018-08-01 MED ORDER — POTASSIUM CHLORIDE CRYS ER 20 MEQ PO TBCR
40.0000 meq | EXTENDED_RELEASE_TABLET | Freq: Once | ORAL | Status: AC
  Administered 2018-08-01: 40 meq via ORAL
  Filled 2018-08-01: qty 2

## 2018-08-01 MED ORDER — BOOST / RESOURCE BREEZE PO LIQD CUSTOM
1.0000 | Freq: Three times a day (TID) | ORAL | Status: DC
  Administered 2018-08-01 – 2018-08-02 (×2): 1 via ORAL

## 2018-08-01 MED ORDER — ONDANSETRON HCL 4 MG/2ML IJ SOLN
4.0000 mg | Freq: Once | INTRAMUSCULAR | Status: AC
  Administered 2018-08-01: 4 mg via INTRAVENOUS
  Filled 2018-08-01: qty 2

## 2018-08-01 MED ORDER — ACETAMINOPHEN 325 MG PO TABS: 650.0000 mg | ORAL_TABLET | Freq: Four times a day (QID) | ORAL | Status: DC | PRN

## 2018-08-01 MED ORDER — POTASSIUM CHLORIDE CRYS ER 20 MEQ PO TBCR: 40.0000 meq | EXTENDED_RELEASE_TABLET | ORAL | Status: DC

## 2018-08-01 MED ORDER — IOPAMIDOL (ISOVUE-300) INJECTION 61%
INTRAVENOUS | Status: AC
  Filled 2018-08-01: qty 100

## 2018-08-01 MED ORDER — SODIUM CHLORIDE 0.9 % IV BOLUS
1000.0000 mL | Freq: Once | INTRAVENOUS | Status: AC
  Administered 2018-08-01: 1000 mL via INTRAVENOUS

## 2018-08-01 MED ORDER — RIVAROXABAN 10 MG PO TABS
10.0000 mg | ORAL_TABLET | Freq: Every day | ORAL | Status: DC
  Administered 2018-08-01 – 2018-08-03 (×3): 10 mg via ORAL
  Filled 2018-08-01 (×4): qty 1

## 2018-08-01 MED ORDER — FENTANYL 25 MCG/HR TD PT72: 25.0000 ug | MEDICATED_PATCH | TRANSDERMAL | Status: DC

## 2018-08-01 MED ORDER — HYDROMORPHONE HCL 1 MG/ML IJ SOLN
1.0000 mg | Freq: Once | INTRAMUSCULAR | Status: AC
  Administered 2018-08-01: 1 mg via INTRAVENOUS
  Filled 2018-08-01: qty 1

## 2018-08-01 MED ORDER — ONDANSETRON HCL 4 MG PO TABS: 4.0000 mg | ORAL_TABLET | Freq: Four times a day (QID) | ORAL | Status: DC | PRN

## 2018-08-01 MED ORDER — HYDROMORPHONE HCL 1 MG/ML IJ SOLN
1.0000 mg | INTRAMUSCULAR | Status: DC | PRN
  Administered 2018-08-01 – 2018-08-02 (×8): 1 mg via INTRAVENOUS
  Filled 2018-08-01 (×8): qty 1

## 2018-08-01 MED ORDER — IOPAMIDOL (ISOVUE-300) INJECTION 61%
100.0000 mL | Freq: Once | INTRAVENOUS | Status: AC | PRN
  Administered 2018-08-01: 100 mL via INTRAVENOUS

## 2018-08-01 MED ORDER — POTASSIUM CHLORIDE IN NACL 40-0.9 MEQ/L-% IV SOLN
INTRAVENOUS | Status: DC
  Administered 2018-08-01 – 2018-08-04 (×6): 125 mL/h via INTRAVENOUS
  Filled 2018-08-01 (×7): qty 1000

## 2018-08-01 MED ORDER — ACETAMINOPHEN 650 MG RE SUPP: 650.0000 mg | Freq: Four times a day (QID) | RECTAL | Status: DC | PRN

## 2018-08-01 MED ORDER — HYDROMORPHONE HCL 4 MG PO TABS
4.0000 mg | ORAL_TABLET | Freq: Four times a day (QID) | ORAL | Status: DC | PRN
  Administered 2018-08-01 – 2018-08-03 (×3): 4 mg via ORAL
  Filled 2018-08-01 (×3): qty 2

## 2018-08-01 NOTE — ED Triage Notes (Signed)
Patient reports no BM x 3 days. Patient went to an oncology physician yesterday and was told that his bowels were slow. Patient c/o upper abdominal pain and states he has had poor po intake because of the abdominal pain.

## 2018-08-01 NOTE — H&P (Signed)
History and Physical    Alexander Fry  JJH:[id number]  DOB: October 13, 1961  DOA: 08/01/2018 PCP: [name], PA-C   Patient coming from: home  Chief Complaint: abdominal pain  HPI: Alexander Fry is a 57 y.o. 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 He states he has bloating of his upper abdomen and has been belching but not passing gas from below. He did have a BM a few days ago which was very light in color. He oral intake has declined and he has lost about 8-10 lbs this week. He has not had vomiting but feels the food stays in his stomach and does not move forward like it should. He has felt chills but not feverish. His pain has been about 8/10 and radiates to his right flank.    ED Course: given Dilaudid for pain  Review of Systems:  All other systems reviewed and apart from HPI, are negative.  Past Medical History:  Diagnosis Date  . Bilateral primary osteoarthritis of knee 04/03/2018  . Goals of care, counseling/discussion 12/22/2016  . History of radiation therapy 02/11/2017   SRT right posterior frontal 12 mm target 20 Gy, Right anterior frontal 7m 20 Gy  . History of radiation therapy 02/21/2017   SRT Right post parietal 444mtarget 20 Gy, left Post parietal 33m17m0 Gy, Right Occipital 4 mm 20Gy, Left Occipital 4 mm 20 Gy  . History of radiation therapy 02/25/2017, 02/28/2017, 03/02/2017   SRT L1 spine 27 Gy 3 fractions, L4 Spine 27 Gy 3 fractions  . History of radiation therapy 06/07/2017   SRS- Brain  . History of radiation therapy 04/03/2018   Right ilium, 8 Gy in 1 fraction for a total dose of 8 Gy  . Inguinal hernia of left side without obstruction or gangrene   . Iron deficiency anemia due to chronic blood loss 05/23/2018  . Pneumonia    left lung  . [place]  . 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 His vision has "spots" at times    Past Surgical History:  Procedure Laterality Date  . HERNIA REPAIR    . INGUINAL HERNIA REPAIR Left   . IR FLUORO GUIDE PORT INSERTION RIGHT  02/06/2018  . IR US [place] VASC ACCESS RIGHT  02/06/2018    Social History:   reports that he has never smoked. He has never used smokeless tobacco. He reports that he drinks alcohol. He reports that he does not use drugs.  Allergies  Allergen Reactions  . Feraheme [Ferumoxytol] Anaphylaxis and Other (See Comments)    Flush. Patient denies anaphylaxis.    Family History  Problem Relation Age of Onset  . Heart disease Mother   . Diabetes Mother   . Heart disease Father   . Alcohol abuse Brother      Prior to Admission medications   Medication Sig Start Date End Date Taking? Authorizing Provider  dexamethasone (DECADRON) 0.5 MG/5ML solution Take 10 mLs (1 mg total) by mouth 4 (four) times daily. Swish in mouth for 2 minutes and spit out. Avoid eating/drinking for 1hr after rinse Patient taking differently: Take 1 mg by mouth 4 (four) times daily as needed (for mouth sores). Swish in mouth for 2 minutes and spit out. Avoid eating/drinking for 1hr after rinse 06/02/18  Yes Ennever, PetRudell Cobb  MD  everolimus (AFINITOR) 5 MG tablet Take 1 tablet (5 mg total) by mouth daily. 06/02/18  Yes Ennever, Rudell Cobb, MD  fentaNYL (DURAGESIC - DOSED MCG/HR) 25 MCG/HR patch Place 1 patch (25 mcg total) onto the skin every 3 (three) days. 07/27/18  Yes [place], Holli Humbles, NP  glimepiride (AMARYL) 4 MG tablet Take 1 tablet (4 mg total) by mouth daily with breakfast. 07/20/18  Yes Ennever, Rudell Cobb, MD  HYDROmorphone (DILAUDID) 4 MG tablet Take 1 tablet (4 mg total) by mouth every 6 (six) hours as needed for moderate pain or severe pain. 07/27/18  Yes [place], Holli Humbles, NP  lipase/protease/amylase (CREON) 36000 UNITS CPEP capsule Take 1 capsule (36,000 Units total) by mouth 3 (three) times daily before meals. 07/27/18  Yes  [place], Holli Humbles, NP  rivaroxaban (XARELTO) 10 MG TABS tablet Take 1 tablet (10 mg total) by mouth daily with supper. 06/21/18  Yes [place], Holli Humbles, NP  acetaminophen (TYLENOL) 650 MG CR tablet Take 2 tablets (1,300 mg total) by mouth every 8 (eight) hours as needed for pain. Patient not taking: Reported on 07/20/2018 04/03/18   [name], PA-C  amoxicillin-clavulanate (AUGMENTIN) 875-125 MG tablet Take 1 tablet by mouth 2 (two) times daily. 07/27/18   [place], Holli Humbles, NP  diclofenac sodium (VOLTAREN) 1 % GEL Apply 2 g topically 4 (four) times daily. Patient not taking: Reported on 08/01/2018 07/20/18   [name], MD  lenvatinib 18 mg daily dose (LENVIMA 18 MG DAILY DOSE) 10 & 4 (2) MG capsule Take 18 mg by mouth daily. Patient not taking: Reported on 08/01/2018 06/02/18   [name], MD  metoCLOPramide (REGLAN) 10 MG tablet Take 1 tablet (10 mg total) by mouth 4 (four) times daily -  before meals and at bedtime. 07/31/18   [name], MD  traMADol (ULTRAM) 50 MG tablet Take 2 tablets (100 mg total) by mouth at bedtime as needed for moderate pain. Patient not taking: Reported on 08/01/2018 06/16/18   [name], [place]    Physical Exam: Wt Readings from Last 3 Encounters:  08/01/18 92.1 kg  07/31/18 92.1 kg  07/27/18 93.9 kg   Vitals:   08/01/18 1024  BP: (!) 153/92  Pulse: 98  Resp: 16  Temp: 97.8 F (36.6 C)  TempSrc: Oral  SpO2: 97%  Weight: 92.1 kg  Height: 6' (1.829 m)      Constitutional:  Calm & comfortable Eyes: PERRLA, lids and conjunctivae normal ENT:  Mucous membranes are moist.  Pharynx clear of exudate   Normal dentition.  Neck: Supple, no masses  Respiratory:  Clear to auscultation bilaterally  Normal respiratory effort.  Cardiovascular:  S1 & S2 heard, regular rate and rhythm No Murmurs Abdomen:  Non distended No tenderness, No masses Bowel sounds normal Extremities:  No clubbing / cyanosis No  pedal edema No joint deformity    Skin:  No rashes, lesions or ulcers Neurologic:  AAO x 3 CN 2-12 grossly intact Sensation intact Strength 5/5 in all 4 extremities Psychiatric:  Normal Mood and affect    Labs on Admission: I have personally reviewed following labs and imaging studies  CBC: Recent Labs  Lab 07/27/18 1306 07/31/18 0916 08/01/18 1150  WBC 5.7 4.8 4.3  NEUTROABS 3.5 3.2  --   HGB 12.0* 12.3* 15.8  HCT 37.4* 37.9* 47.4  MCV 75.6* 74.9* 74.4*  PLT 201 203 235   Basic Metabolic Panel: Recent Labs  Lab 07/27/18 1306 07/31/18  1610 08/01/18 1150  NA 137 135 140  K 3.6 3.1* 3.2*  CL 103 98 99  CO2 _0 GLUCOSE 156* 115 112*  BUN 9 6* 7  CREATININE 0.90 0.60 0.40*  CALCIUM 9.2 9.6 9.3   GFR: Estimated Creatinine Clearance: 110.5 mL/min (A) (by C-G formula based on SCr of 0.4 mg/dL (L)). Liver Function Tests: Recent Labs  Lab 07/27/18 1306 07/31/18 0916 08/01/18 1150  AST 84* 185* 187*  ALT 74* 151* 183*  ALKPHOS 489* 733* 802*  BILITOT 0.7 4.1* 5.8*  PROT 7.2 7.3 7.1  ALBUMIN 3.2* 3.2* 3.2*   Recent Labs  Lab 08/01/18 1150  LIPASE 121*   No results for input(s): AMMONIA in the last 168 hours. Coagulation Profile: No results for input(s): INR, PROTIME in the last 168 hours. Cardiac Enzymes: No results for input(s): CKTOTAL, CKMB, CKMBINDEX, TROPONINI in the last 168 hours. BNP (last 3 results) No results for input(s): PROBNP in the last 8760 hours. HbA1C: No results for input(s): HGBA1C in the last 72 hours. CBG: No results for input(s): GLUCAP in the last 168 hours. Lipid Profile: No results for input(s): CHOL, HDL, LDLCALC, TRIG, CHOLHDL, LDLDIRECT in the last 72 hours. Thyroid Function Tests: No results for input(s): TSH, T4TOTAL, FREET4, T3FREE, THYROIDAB in the last 72 hours. Anemia Panel: No results for input(s): VITAMINB12, FOLATE, FERRITIN, TIBC, IRON, RETICCTPCT in the last 72 hours. Urine analysis:    Component  Value Date/Time   COLORURINE AMBER (A) 08/01/2018 1107   APPEARANCEUR CLEAR 08/01/2018 1107   LABSPEC 1.021 08/01/2018 1107   PHURINE 5.0 08/01/2018 1107   GLUCOSEU 50 (A) 08/01/2018 1107   HGBUR SMALL (A) 08/01/2018 1107   BILIRUBINUR MODERATE (A) 08/01/2018 1107   KETONESUR 80 (A) 08/01/2018 1107   PROTEINUR 30 (A) 08/01/2018 1107   NITRITE NEGATIVE 08/01/2018 1107   LEUKOCYTESUR SMALL (A) 08/01/2018 1107   Sepsis Labs: _1 (procalcitonin:4,lacticidven:4) )No results found for this or any previous visit (from the past 240 hour(s)).   Radiological Exams on Admission: Ct Abdomen Pelvis W Contrast  Result Date: 08/01/2018 CLINICAL DATA:  57 year old with abdominal distension and acute abdominal pain. History of metastatic renal cell carcinoma. EXAM: CT ABDOMEN AND PELVIS WITH CONTRAST TECHNIQUE: Multidetector CT imaging of the abdomen and pelvis was performed using the standard protocol following bolus administration of intravenous contrast. CONTRAST:  143m ISOVUE-300 IOPAMIDOL (ISOVUE-300) INJECTION 61% COMPARISON:  PET-CT 04/28/2018 FINDINGS: Lower chest: There is a low-density necrotic lymph node in the subcarinal region that measures 2.4 cm in the short axis. There was disease in this area on the previous PET-CT. Dependent densities in the lower lobes are suggestive for atelectasis. No large pleural effusions. Hepatobiliary: There is new perihepatic ascites. 1.1 cm right hepatic lesion on sequence 2, image 16. This was not clearly present on the prior PET and could represent a new or recurrent hepatic lesion. There is periportal edema. There is edema or fluid around the gallbladder which contains multiple calcified stones. Mild gallbladder distension compared to the previous CT. There is no significant extrahepatic biliary dilatation. Main portal veins are patent. Pancreas: Unremarkable. No pancreatic ductal dilatation or surrounding inflammatory changes. Spleen: New small amount of  perisplenic ascites. Otherwise, normal appearance of the spleen. Adrenals/Urinary Tract: Normal appearance of the right adrenal gland. Question mild fullness of the left adrenal gland. Stable appearance of left kidney with a cortical cyst in the upper pole. Urinary bladder is unremarkable. Again noted is a large heterogeneous mass involving the  right kidney lower pole. Mass has not significantly changed in size measuring 9.8 x 8.5 cm on sequence 2, image 42. The mass abuts the C-loop of the duodenum. Stomach/Bowel: There is diffuse wall thickening involving the transverse segment of the duodenum. Wall thickening measures roughly 1.1 cm. No evidence for wall thickening in the proximal duodenum. No gross abnormality to the stomach. Again noted is mesenteric edema in the right central aspect of the abdomen. There is no evidence for a bowel obstruction. Vascular/Lymphatic: Mild atherosclerotic disease in the aorta without aneurysm. Again noted are enlarged periaortic lymph nodes which have not significantly changed. Left periaortic lymph node on sequence 2 image 40 measures 2 cm in the short axis and recent recently measured 1.9 cm on the PET-CT. Retrocrural lymph nodes have decreased in size. Left retrocrural lymph node on series 2, image 23 measures 1.1 cm in short axis and previously measured 2.0 cm. Reproductive: Prostate is unremarkable. Other: Small amount of pelvic ascites is new. Mild mesenteric edema. Small amount of ascites in the upper abdomen around the liver and spleen are new. Musculoskeletal: Again noted are lucent bone lesions involving L1 and L4. Lucent bone lesion involving the left ilium on sequence 2 image 66 measures 1.3 cm and recently measured 0.9 cm. Lucent lesion in the left iliac wing measures 0.9 cm and more conspicuous compared to the recent comparison examination. IMPRESSION: New wall thickening involving the third and fourth portions of the duodenum of uncertain etiology. This could be  infectious or inflammatory. The primary renal tumor abuts the duodenum but tumor involvement in this area is thought to be less likely. Metastatic renal cell carcinoma with a mixed response to therapy since the recent PET-CT. Concern for a new liver lesion and enlarging bone lesions in the left ilium. In addition, there is new ascites. However, the retrocrural lymph nodes have decreased in size. No significant change in the primary right renal lesion. Cholelithiasis with a small amount of pericholecystic edema or fluid. This pericholecystic fluid could be related to the ascites but limited evaluation for cholecystitis. If there is clinical concern for cholecystitis, recommend further characterization with a right upper quadrant ultrasound. Electronically Signed   By: Markus Daft M.D.   On: 08/01/2018 13:58       Assessment/Plan Principal Problem:   Elevated LFTs/ epigastric and RUQ pain  - CT reviewed -   symptoms of gray stool, RLQ pain with bloating and weigh loss -  will admit and place on clear liquids - give IV Dilaudid in addition to oral Dilaudid (on 4 mg Every 6 hrs today) -  ? Need for EGD- Have contacted GI, Dr Benson Norway, who will come evaluate him  Active Problems:    Kidney cancer, primary, with metastasis from kidney to other site, right  - has stopped Lenvima and Afinitor as of yesterday after discussion with Dr Marin Olp   - CT reveals new met in the liver and left hip bone- no pain in left hip  Hypokalemia - replace and recheck tomorrow   H/o right IJ thrombus - cont Xarelto  Iron deficiency anemia - received Iron infusions by Dr Marin Olp  DVT prophylaxis: Xarelto Code Status: Full code  Family Communication: contact Cheri Rous, Alanson Puls- number on face sheet Disposition Plan: med/surg  Consults called: GI- Dr Benson Norway  Admission status: inpatient    [name] MD [facility] Pager: [email or web address] Password [email or web address] [phone], please contact  night-coverage   08/01/2018, 3:03 PM

## 2018-08-01 NOTE — Telephone Encounter (Signed)
Patient continues to do poorly. He feels severe pain in his abdomen with severe gas pains. He didn't sleep last night due to the discomfort. He states "I can't handle feeling like this anymore"  He has been in the office twice in the last 4 days for symptom management without relief of symptoms. Reviewed with Dr Marin Olp and he suggests patient go to the emergency room for workup. Patient understands and he will go to the [facility] Emergency Department.

## 2018-08-01 NOTE — Consult Note (Signed)
Reason for Consult: Abnormal CT scan, ABM pain, and Elevated liver enzyms Referring Physician: [facility]  [name] HPI: This is a 57 year old male with a PMH of metastatic renal cell carcinoma that is poorly responsive to chemotherapy and radiation therapy admitted for complaints of upper abdominal pain.  The patient reports that the pain started a couple of weeks ago and it was gradual in onset.  The pain is described as sharp and it is constant, but worsened with PO intake.  He does not have any complaints of nausea, vomiting, or dysphagia.  There is no history of NSAID use or any know history of PUD.  The patient denies any issues with hematochezia or melena.  There is an 8 lbs weight loss over the past week as a result of decreased PO intake.  A CT scan of the abdomen was performed and it positive periportal edema, new perihepatic ascites, cholelithiasis, no evidence of biliary obstruction, and a new finding of thickening in the 3rd and 4th portions of the duodenum.  Past Medical History:  Diagnosis Date  . Bilateral primary osteoarthritis of knee 04/03/2018  . Goals of care, counseling/discussion 12/22/2016  . History of radiation therapy 02/11/2017   SRT right posterior frontal 12 mm target 20 Gy, Right anterior frontal 67m 20 Gy  . History of radiation therapy 02/21/2017   SRT Right post parietal 465mtarget 20 Gy, left Post parietal 53m453m0 Gy, Right Occipital 4 mm 20Gy, Left Occipital 4 mm 20 Gy  . History of radiation therapy 02/25/2017, 02/28/2017, 03/02/2017   SRT L1 spine 27 Gy 3 fractions, L4 Spine 27 Gy 3 fractions  . History of radiation therapy 06/07/2017   SRS- Brain  . History of radiation therapy 04/03/2018   Right ilium, 8 Gy in 1 fraction for a total dose of 8 Gy  . Inguinal hernia of left side without obstruction or gangrene   . Iron deficiency anemia due to chronic blood loss 05/23/2018  . Pneumonia    left lung  . Renal cell carcinoma, right ([facility]/2018  .  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 His vision has "spots" at times    Past Surgical History:  Procedure Laterality Date  . HERNIA REPAIR    . INGUINAL HERNIA REPAIR Left   . IR FLUORO GUIDE PORT INSERTION RIGHT  02/06/2018  . IR US [place] VASC ACCESS RIGHT  02/06/2018    Family History  Problem Relation Age of Onset  . Heart disease Mother   . Diabetes Mother   . Heart disease Father   . Alcohol abuse Brother     Social History:  reports that he has never smoked. He has never used smokeless tobacco. He reports that he drinks alcohol. He reports that he does not use drugs.  Allergies:  Allergies  Allergen Reactions  . Feraheme [Ferumoxytol] Anaphylaxis and Other (See Comments)    Flush. Patient denies anaphylaxis.    Medications:  Scheduled: . feeding supplement  1 Container Oral TID BM  . [START ON 08/04/2018] fentaNYL  25 mcg Transdermal Q72H  . iopamidol      . rivaroxaban  10 mg Oral Q supper   Continuous: . 0.9 % NaCl with KCl 40 mEq / L 125 mL/hr (08/01/18 1706)    Results for orders placed or performed during the hospital encounter of 08/01/18 (from the past 24 hour(s))  Urinalysis, Routine w reflex microscopic     Status:  Abnormal   Collection Time: 08/01/18 11:07 AM  Result Value Ref Range   Color, Urine AMBER (A) YELLOW   APPearance CLEAR CLEAR   Specific Gravity, Urine 1.021 1.005 - 1.030   pH 5.0 5.0 - 8.0   Glucose, UA 50 (A) NEGATIVE mg/dL   Hgb urine dipstick SMALL (A) NEGATIVE   Bilirubin Urine MODERATE (A) NEGATIVE   Ketones, ur 80 (A) NEGATIVE mg/dL   Protein, ur 30 (A) NEGATIVE mg/dL   Nitrite NEGATIVE NEGATIVE   Leukocytes, UA SMALL (A) NEGATIVE   RBC / HPF 0-5 0 - 5 RBC/hpf   WBC, UA 21-50 0 - 5 WBC/hpf   Bacteria, UA FEW (A) NONE SEEN   Squamous Epithelial / LPF 0-5 0 - 5   Mucus PRESENT   Lipase, blood     Status: Abnormal   Collection Time: 08/01/18 11:50 AM  Result Value Ref Range   Lipase 121 (H) 11 - 51 U/L   Comprehensive metabolic panel     Status: Abnormal   Collection Time: 08/01/18 11:50 AM  Result Value Ref Range   Sodium 140 135 - 145 mmol/L   Potassium 3.2 (L) 3.5 - 5.1 mmol/L   Chloride 99 98 - 111 mmol/L   CO2 27 22 - 32 mmol/L   Glucose, Bld 112 (H) 70 - 99 mg/dL   BUN 7 6 - 20 mg/dL   Creatinine, Ser 0.40 (L) 0.61 - 1.24 mg/dL   Calcium 9.3 8.9 - 10.3 mg/dL   Total Protein 7.1 6.5 - 8.1 g/dL   Albumin 3.2 (L) 3.5 - 5.0 g/dL   AST 187 (H) 15 - 41 U/L   ALT 183 (H) 0 - 44 U/L   Alkaline Phosphatase 802 (H) 38 - 126 U/L   Total Bilirubin 5.8 (H) 0.3 - 1.2 mg/dL   GFR calc non Af Amer >60 >60 mL/min   GFR calc Af Amer >60 >60 mL/min   Anion gap 14 5 - 15  CBC     Status: Abnormal   Collection Time: 08/01/18 11:50 AM  Result Value Ref Range   WBC 4.3 4.0 - 10.5 K/uL   RBC 6.37 (H) 4.22 - 5.81 MIL/uL   Hemoglobin 15.8 13.0 - 17.0 g/dL   HCT 47.4 39.0 - 52.0 %   MCV 74.4 (L) 78.0 - 100.0 fL   MCH 24.8 (L) 26.0 - 34.0 pg   MCHC 33.3 30.0 - 36.0 g/dL   RDW 15.7 (H) 11.5 - 15.5 %   Platelets 165 150 - 400 K/uL     Ct Abdomen Pelvis W Contrast  Result Date: 08/01/2018 CLINICAL DATA:  57 year old with abdominal distension and acute abdominal pain. History of metastatic renal cell carcinoma. EXAM: CT ABDOMEN AND PELVIS WITH CONTRAST TECHNIQUE: Multidetector CT imaging of the abdomen and pelvis was performed using the standard protocol following bolus administration of intravenous contrast. CONTRAST:  18m ISOVUE-300 IOPAMIDOL (ISOVUE-300) INJECTION 61% COMPARISON:  PET-CT 04/28/2018 FINDINGS: Lower chest: There is a low-density necrotic lymph node in the subcarinal region that measures 2.4 cm in the short axis. There was disease in this area on the previous PET-CT. Dependent densities in the lower lobes are suggestive for atelectasis. No large pleural effusions. Hepatobiliary: There is new perihepatic ascites. 1.1 cm right hepatic lesion on sequence 2, image 16. This was not clearly  present on the prior PET and could represent a new or recurrent hepatic lesion. There is periportal edema. There is edema or fluid around the gallbladder which contains  multiple calcified stones. Mild gallbladder distension compared to the previous CT. There is no significant extrahepatic biliary dilatation. Main portal veins are patent. Pancreas: Unremarkable. No pancreatic ductal dilatation or surrounding inflammatory changes. Spleen: New small amount of perisplenic ascites. Otherwise, normal appearance of the spleen. Adrenals/Urinary Tract: Normal appearance of the right adrenal gland. Question mild fullness of the left adrenal gland. Stable appearance of left kidney with a cortical cyst in the upper pole. Urinary bladder is unremarkable. Again noted is a large heterogeneous mass involving the right kidney lower pole. Mass has not significantly changed in size measuring 9.8 x 8.5 cm on sequence 2, image 42. The mass abuts the C-loop of the duodenum. Stomach/Bowel: There is diffuse wall thickening involving the transverse segment of the duodenum. Wall thickening measures roughly 1.1 cm. No evidence for wall thickening in the proximal duodenum. No gross abnormality to the stomach. Again noted is mesenteric edema in the right central aspect of the abdomen. There is no evidence for a bowel obstruction. Vascular/Lymphatic: Mild atherosclerotic disease in the aorta without aneurysm. Again noted are enlarged periaortic lymph nodes which have not significantly changed. Left periaortic lymph node on sequence 2 image 40 measures 2 cm in the short axis and recent recently measured 1.9 cm on the PET-CT. Retrocrural lymph nodes have decreased in size. Left retrocrural lymph node on series 2, image 23 measures 1.1 cm in short axis and previously measured 2.0 cm. Reproductive: Prostate is unremarkable. Other: Small amount of pelvic ascites is new. Mild mesenteric edema. Small amount of ascites in the upper abdomen around the  liver and spleen are new. Musculoskeletal: Again noted are lucent bone lesions involving L1 and L4. Lucent bone lesion involving the left ilium on sequence 2 image 66 measures 1.3 cm and recently measured 0.9 cm. Lucent lesion in the left iliac wing measures 0.9 cm and more conspicuous compared to the recent comparison examination. IMPRESSION: New wall thickening involving the third and fourth portions of the duodenum of uncertain etiology. This could be infectious or inflammatory. The primary renal tumor abuts the duodenum but tumor involvement in this area is thought to be less likely. Metastatic renal cell carcinoma with a mixed response to therapy since the recent PET-CT. Concern for a new liver lesion and enlarging bone lesions in the left ilium. In addition, there is new ascites. However, the retrocrural lymph nodes have decreased in size. No significant change in the primary right renal lesion. Cholelithiasis with a small amount of pericholecystic edema or fluid. This pericholecystic fluid could be related to the ascites but limited evaluation for cholecystitis. If there is clinical concern for cholecystitis, recommend further characterization with a right upper quadrant ultrasound. Electronically Signed   By: Markus Daft M.D.   On: 08/01/2018 13:58    ROS:  As stated above in the HPI otherwise negative.  Blood pressure (!) 152/82, pulse 79, temperature 98.9 F (37.2 C), temperature source Oral, resp. rate 18, height 5' 11.5" (1.816 m), weight 92.1 kg, SpO2 96 %.    PE: Gen: NAD, Alert and Oriented HEENT:  [place]/AT, EOMI Neck: Supple, no LAD Lungs: CTA Bilaterally CV: RRR without M/G/R ABM: Soft, tender in the bilateral upper abdomen, +BS Ext: No C/C/E  Assessment/Plan: 1) Abnormal CT scan. 2) Upper abdominal pain. 3) Metastatic renal cell carcinoma with bony mets. 4) Weight loss. 5) Elevated liver enzymes. 6) Cholelithiasis.   The findings of the duodenum require further work up and it  is most likely the source of his  pain.  There is cholelithiasis, but it id difficult to determine if there is a true cholecystitis with the perihepatic edema.  There is no evidence of biliary ductal dilation.  An EGD will be performed.  If the EGD cannot adequately explain the patient's symptoms, as recommended by radiology, a RUQ U/S can be performed to see if there is cholecystitis.  His AP is markedly elevated and this can be explained by the bony mets.  In the left ileum there is an enlargement of the mets and also a possible new met to the liver.  The patient has had an elevation in his AP since his diagnosis of renal cell last Winter.  Plan: 1) EGD tomorrow.  Mandi Mattioli D 08/01/2018, 8:57 PM

## 2018-08-01 NOTE — ED Notes (Signed)
Per Dr. Benson Norway, patients ultrasound to be cancelled.

## 2018-08-01 NOTE — ED Notes (Signed)
ED TO INPATIENT HANDOFF REPORT  Name/Age/Gender Alexander Fry 57 y.o. male  Code Status    Code Status Orders  (From admission, onward)         Start     Ordered   08/01/18 1459  Full code  Continuous     08/01/18 1500        Code Status History    Date Active Date Inactive Code Status Order ID Comments User Context   04/21/2017 1741 04/23/2017 1218 Full Code [id number]  Rizwan, Saima, MD Inpatient      Home/SNF/Other Home  Chief Complaint ABD pain   Level of Care/Admitting Diagnosis ED Disposition    ED Disposition Condition Comment   Admit  Hospital Area: [facility] [[id number]]  Level of Care: Med-Surg [16]  Diagnosis: Elevated LFTs [321910]  Admitting Physician: RIZWAN, SAIMA [[id number]]  Attending Physician: RIZWAN, SAIMA [[id number]]  Estimated length of stay: past midnight tomorrow  Certification:: I certify this patient will need inpatient services for at least 2 midnights  PT Class (Do Not Modify): Inpatient [101]  PT Acc Code (Do Not Modify): Private [1]       Medical History Past Medical History:  Diagnosis Date  . Bilateral primary osteoarthritis of knee 04/03/2018  . Goals of care, counseling/discussion 12/22/2016  . History of radiation therapy 02/11/2017   SRT right posterior frontal 12 mm target 20 Gy, Right anterior frontal 5mm 20 Gy  . History of radiation therapy 02/21/2017   SRT Right post parietal 4mm target 20 Gy, left Post parietal 3mm 20 Gy, Right Occipital 4 mm 20Gy, Left Occipital 4 mm 20 Gy  . History of radiation therapy 02/25/2017, 02/28/2017, 03/02/2017   SRT L1 spine 27 Gy 3 fractions, L4 Spine 27 Gy 3 fractions  . History of radiation therapy 06/07/2017   SRS- Brain  . History of radiation therapy 04/03/2018   Right ilium, 8 Gy in 1 fraction for a total dose of 8 Gy  . Inguinal hernia of left side without obstruction or gangrene   . Iron deficiency anemia due to chronic blood loss 05/23/2018  . Pneumonia    left lung  .  Renal cell carcinoma, right (HCC) 01/05/2017  . 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 His vision has "spots" at times    Allergies Allergies  Allergen Reactions  . Feraheme [Ferumoxytol] Anaphylaxis and Other (See Comments)    Flush. Patient denies anaphylaxis.    IV Location/Drains/Wounds Patient Lines/Drains/Airways Status   Active Line/Drains/Airways    Name:   Placement date:   Placement time:   Site:   Days:   Implanted Port 02/06/18 Right   02/06/18    0953    -   176   Implanted Port 02/07/18 Right Chest   02/07/18    0850    Chest   175   Peripheral IV 02/08/17 Left;Other (Comment) Antecubital   02/08/17    0940    Antecubital   539          Labs/Imaging Results for orders placed or performed during the hospital encounter of 08/01/18 (from the past 48 hour(s))  Urinalysis, Routine w reflex microscopic     Status: Abnormal   Collection Time: 08/01/18 11:07 AM  Result Value Ref Range   Color, Urine AMBER (A) YELLOW    Comment: BIOCHEMICALS MAY BE AFFECTED BY COLOR   APPearance CLEAR CLEAR   Specific Gravity, Urine 1.021 1.005 - 1.030     pH 5.0 5.0 - 8.0   Glucose, UA 50 (A) NEGATIVE mg/dL   Hgb urine dipstick SMALL (A) NEGATIVE   Bilirubin Urine MODERATE (A) NEGATIVE   Ketones, ur 80 (A) NEGATIVE mg/dL   Protein, ur 30 (A) NEGATIVE mg/dL   Nitrite NEGATIVE NEGATIVE   Leukocytes, UA SMALL (A) NEGATIVE   RBC / HPF 0-5 0 - 5 RBC/hpf   WBC, UA 21-50 0 - 5 WBC/hpf   Bacteria, UA FEW (A) NONE SEEN   Squamous Epithelial / LPF 0-5 0 - 5   Mucus PRESENT     Comment: Performed at [facility], [place] [address] [place], [place] [address]  Lipase, blood     Status: Abnormal   Collection Time: 08/01/18 11:50 AM  Result Value Ref Range   Lipase 121 (H) 11 - 51 U/L    Comment: Performed at [facility], [place] [address], [place], [place] [address]  Comprehensive metabolic panel     Status: Abnormal   Collection Time:  08/01/18 11:50 AM  Result Value Ref Range   Sodium 140 135 - 145 mmol/L   Potassium 3.2 (L) 3.5 - 5.1 mmol/L   Chloride 99 98 - 111 mmol/L   CO2 27 22 - 32 mmol/L   Glucose, Bld 112 (H) 70 - 99 mg/dL   BUN 7 6 - 20 mg/dL   Creatinine, Ser 0.40 (L) 0.61 - 1.24 mg/dL   Calcium 9.3 8.9 - 10.3 mg/dL   Total Protein 7.1 6.5 - 8.1 g/dL   Albumin 3.2 (L) 3.5 - 5.0 g/dL   AST 187 (H) 15 - 41 U/L   ALT 183 (H) 0 - 44 U/L   Alkaline Phosphatase 802 (H) 38 - 126 U/L   Total Bilirubin 5.8 (H) 0.3 - 1.2 mg/dL   GFR calc non Af Amer >60 >60 mL/min   GFR calc Af Amer >60 >60 mL/min    Comment: (NOTE) The eGFR has been calculated using the CKD EPI equation. This calculation has not been validated in all clinical situations. eGFR's persistently <60 mL/min signify possible Chronic Kidney Disease.    Anion gap 14 5 - 15    Comment: Performed at [facility], [place] [address], [place], [place] [address]  CBC     Status: Abnormal   Collection Time: 08/01/18 11:50 AM  Result Value Ref Range   WBC 4.3 4.0 - 10.5 K/uL   RBC 6.37 (H) 4.22 - 5.81 MIL/uL   Hemoglobin 15.8 13.0 - 17.0 g/dL   HCT 47.4 39.0 - 52.0 %   MCV 74.4 (L) 78.0 - 100.0 fL   MCH 24.8 (L) 26.0 - 34.0 pg   MCHC 33.3 30.0 - 36.0 g/dL   RDW 15.7 (H) 11.5 - 15.5 %   Platelets 165 150 - 400 K/uL    Comment: Performed at [facility], [place] [address] [place], [place] [address]   Ct Abdomen Pelvis W Contrast  Result Date: 08/01/2018 CLINICAL DATA:  57 year old with abdominal distension and acute abdominal pain. History of metastatic renal cell carcinoma. EXAM: CT ABDOMEN AND PELVIS WITH CONTRAST TECHNIQUE: Multidetector CT imaging of the abdomen and pelvis was performed using the standard protocol following bolus administration of intravenous contrast. CONTRAST:  170m ISOVUE-300 IOPAMIDOL (ISOVUE-300) INJECTION 61% COMPARISON:  PET-CT 04/28/2018 FINDINGS: Lower chest: There is a low-density necrotic  lymph node in the subcarinal region that measures 2.4 cm in the short axis. There was disease in this area on  the previous PET-CT. Dependent densities in the lower lobes are suggestive for atelectasis. No large pleural effusions. Hepatobiliary: There is new perihepatic ascites. 1.1 cm right hepatic lesion on sequence 2, image 16. This was not clearly present on the prior PET and could represent a new or recurrent hepatic lesion. There is periportal edema. There is edema or fluid around the gallbladder which contains multiple calcified stones. Mild gallbladder distension compared to the previous CT. There is no significant extrahepatic biliary dilatation. Main portal veins are patent. Pancreas: Unremarkable. No pancreatic ductal dilatation or surrounding inflammatory changes. Spleen: New small amount of perisplenic ascites. Otherwise, normal appearance of the spleen. Adrenals/Urinary Tract: Normal appearance of the right adrenal gland. Question mild fullness of the left adrenal gland. Stable appearance of left kidney with a cortical cyst in the upper pole. Urinary bladder is unremarkable. Again noted is a large heterogeneous mass involving the right kidney lower pole. Mass has not significantly changed in size measuring 9.8 x 8.5 cm on sequence 2, image 42. The mass abuts the C-loop of the duodenum. Stomach/Bowel: There is diffuse wall thickening involving the transverse segment of the duodenum. Wall thickening measures roughly 1.1 cm. No evidence for wall thickening in the proximal duodenum. No gross abnormality to the stomach. Again noted is mesenteric edema in the right central aspect of the abdomen. There is no evidence for a bowel obstruction. Vascular/Lymphatic: Mild atherosclerotic disease in the aorta without aneurysm. Again noted are enlarged periaortic lymph nodes which have not significantly changed. Left periaortic lymph node on sequence 2 image 40 measures 2 cm in the short axis and recent recently  measured 1.9 cm on the PET-CT. Retrocrural lymph nodes have decreased in size. Left retrocrural lymph node on series 2, image 23 measures 1.1 cm in short axis and previously measured 2.0 cm. Reproductive: Prostate is unremarkable. Other: Small amount of pelvic ascites is new. Mild mesenteric edema. Small amount of ascites in the upper abdomen around the liver and spleen are new. Musculoskeletal: Again noted are lucent bone lesions involving L1 and L4. Lucent bone lesion involving the left ilium on sequence 2 image 66 measures 1.3 cm and recently measured 0.9 cm. Lucent lesion in the left iliac wing measures 0.9 cm and more conspicuous compared to the recent comparison examination. IMPRESSION: New wall thickening involving the third and fourth portions of the duodenum of uncertain etiology. This could be infectious or inflammatory. The primary renal tumor abuts the duodenum but tumor involvement in this area is thought to be less likely. Metastatic renal cell carcinoma with a mixed response to therapy since the recent PET-CT. Concern for a new liver lesion and enlarging bone lesions in the left ilium. In addition, there is new ascites. However, the retrocrural lymph nodes have decreased in size. No significant change in the primary right renal lesion. Cholelithiasis with a small amount of pericholecystic edema or fluid. This pericholecystic fluid could be related to the ascites but limited evaluation for cholecystitis. If there is clinical concern for cholecystitis, recommend further characterization with a right upper quadrant ultrasound. Electronically Signed   By: Markus Daft M.D.   On: 08/01/2018 13:58    Pending Labs Unresulted Labs (From admission, onward)    Start     Ordered   08/02/18 4765  Basic metabolic panel  Tomorrow morning,   R     08/01/18 1500   08/02/18 0500  CBC  Tomorrow morning,   R     08/01/18 1500   08/01/18 1458  HIV antibody (Routine Testing)  Once,   R     08/01/18 1500           Vitals/Pain Today's Vitals   08/01/18 1024 08/01/18 1031 08/01/18 1503  BP: (!) 153/92    Pulse: 98    Resp: 16    Temp: 97.8 F (36.6 C)    TempSrc: Oral    SpO2: 97%    Weight: 92.1 kg    Height: 6' (1.829 m)    PainSc:  8  3     Isolation Precautions No active isolations  Medications Medications  iopamidol (ISOVUE-300) 61 % injection (has no administration in time range)  HYDROmorphone (DILAUDID) injection 1 mg (1 mg Intravenous Given 08/01/18 1504)  fentaNYL (DURAGESIC - dosed mcg/hr) patch 25 mcg (has no administration in time range)  rivaroxaban (XARELTO) tablet 10 mg (has no administration in time range)  acetaminophen (TYLENOL) tablet 650 mg (has no administration in time range)    Or  acetaminophen (TYLENOL) suppository 650 mg (has no administration in time range)  ondansetron (ZOFRAN) tablet 4 mg (has no administration in time range)    Or  ondansetron (ZOFRAN) injection 4 mg (has no administration in time range)  HYDROmorphone (DILAUDID) tablet 4 mg (has no administration in time range)  0.9 % NaCl with KCl 40 mEq / L  infusion (has no administration in time range)  potassium chloride SA (K-DUR,KLOR-CON) CR tablet 40 mEq (has no administration in time range)  sodium chloride 0.9 % bolus 1,000 mL (0 mLs Intravenous Stopped 08/01/18 1525)  ondansetron (ZOFRAN) injection 4 mg (4 mg Intravenous Given 08/01/18 1158)  HYDROmorphone (DILAUDID) injection 1 mg (1 mg Intravenous Given 08/01/18 1159)  iopamidol (ISOVUE-300) 61 % injection 100 mL (100 mLs Intravenous Contrast Given 08/01/18 1304)    Mobility walks

## 2018-08-01 NOTE — Plan of Care (Signed)
  Problem: Education: Goal: Knowledge of General Education information will improve Description: Including pain rating scale, medication(s)/side effects and non-pharmacologic comfort measures Outcome: Progressing   Problem: Health Behavior/Discharge Planning: Goal: Ability to manage health-related needs will improve Outcome: Progressing   Problem: Elimination: Goal: Will not experience complications related to urinary retention Outcome: Progressing   Problem: Safety: Goal: Ability to remain free from injury will improve Outcome: Progressing   Problem: Skin Integrity: Goal: Risk for impaired skin integrity will decrease Outcome: Progressing   

## 2018-08-01 NOTE — ED Provider Notes (Signed)
[facility] DEPT Provider Note   CSN: [id number] Arrival date & time: 08/01/18  1014     History   Chief Complaint Chief Complaint  Patient presents with  . Abdominal Pain  . Constipation    HPI Alexander Fry is a 57 y.o. male.  HPI Patient presents with abdominal pain.  History of renal cell carcinoma.  States when he eats his upper abdomen will swell up and become more painful.  Has had nausea but has not vomited with it.  States no bowel movement in 3 days.  Because the pain has not been able to sleep well and has not really been eating much.  Has metastatic renal cancer.  His oncologist had stopped his chemotherapy regimen thinking this could be causing some of the pain.  Has had recent IV fluids.  He has had continued pain in his right flank and possibly increasing since renal biopsy around a month ago. Past Medical History:  Diagnosis Date  . Bilateral primary osteoarthritis of knee 04/03/2018  . Goals of care, counseling/discussion 12/22/2016  . History of radiation therapy 02/11/2017   SRT right posterior frontal 12 mm target 20 Gy, Right anterior frontal 64mm 20 Gy  . History of radiation therapy 02/21/2017   SRT Right post parietal 21mm target 20 Gy, left Post parietal 76mm 20 Gy, Right Occipital 4 mm 20Gy, Left Occipital 4 mm 20 Gy  . History of radiation therapy 02/25/2017, 02/28/2017, 03/02/2017   SRT L1 spine 27 Gy 3 fractions, L4 Spine 27 Gy 3 fractions  . History of radiation therapy 06/07/2017   SRS- Brain  . History of radiation therapy 04/03/2018   Right ilium, 8 Gy in 1 fraction for a total dose of 8 Gy  . Inguinal hernia of left side without obstruction or gangrene   . Iron deficiency anemia due to chronic blood loss 05/23/2018  . Pneumonia    left lung  . Renal cell carcinoma, right ([place]) 01/05/2017  . 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 His vision has "spots" at times    Patient Active Problem List   Diagnosis Date Noted  . Iron deficiency anemia due to chronic blood loss 05/23/2018  . Hematuria, gross 05/03/2018  . Sleep disturbance 04/06/2018  . Knee arthropathy 04/03/2018  . Bilateral primary osteoarthritis of knee 04/03/2018  . Drug-induced jaundice 04/21/2017  . Rash and nonspecific skin eruption 04/21/2017  . Transaminitis 02/20/2017  . Onychomycosis of left great toe 02/17/2017  . Corn of foot 02/17/2017  . Brain metastases ([place]) 01/30/2017  . Bone metastases ([place]) 01/30/2017  . Internal hemorrhoids 01/07/2017  . Kidney cancer, primary, with metastasis from kidney to other site, right ([place]) 01/05/2017  . Acute right-sided low back pain with right-sided sciatica 12/24/2016  . Goals of care, counseling/discussion 12/22/2016  . Benign prostatic hyperplasia with nocturia 12/10/2016  . Abnormal CT of the chest 12/10/2016  . Hilar adenopathy 12/10/2016  . Multiple pulmonary nodules determined by computed tomography of lung 12/10/2016    Past Surgical History:  Procedure Laterality Date  . HERNIA REPAIR    . INGUINAL HERNIA REPAIR Left   . IR FLUORO GUIDE PORT INSERTION RIGHT  02/06/2018  . IR US GUIDE VASC ACCESS RIGHT  02/06/2018        Home Medications    Prior to Admission medications   Medication Sig Start Date End Date Taking? Authorizing Provider  dexamethasone (DECADRON) 0.5 MG/5ML solution Take 10 mLs (1  mg total) by mouth 4 (four) times daily. Swish in mouth for 2 minutes and spit out. Avoid eating/drinking for 1hr after rinse Patient taking differently: Take 1 mg by mouth 4 (four) times daily as needed (for mouth sores). Swish in mouth for 2 minutes and spit out. Avoid eating/drinking for 1hr after rinse 06/02/18  Yes Ennever, Rudell Cobb, MD  everolimus (AFINITOR) 5 MG tablet Take 1 tablet (5 mg total) by mouth daily. 06/02/18  Yes Ennever, Rudell Cobb, MD  fentaNYL (DURAGESIC - DOSED MCG/HR) 25 MCG/HR patch Place 1 patch (25 mcg total) onto the skin every 3 (three) days.  07/27/18  Yes [place], Holli Humbles, NP  glimepiride (AMARYL) 4 MG tablet Take 1 tablet (4 mg total) by mouth daily with breakfast. 07/20/18  Yes Ennever, Rudell Cobb, MD  HYDROmorphone (DILAUDID) 4 MG tablet Take 1 tablet (4 mg total) by mouth every 6 (six) hours as needed for moderate pain or severe pain. 07/27/18  Yes [place], Holli Humbles, NP  lipase/protease/amylase (CREON) 36000 UNITS CPEP capsule Take 1 capsule (36,000 Units total) by mouth 3 (three) times daily before meals. 07/27/18  Yes [place], Holli Humbles, NP  rivaroxaban (XARELTO) 10 MG TABS tablet Take 1 tablet (10 mg total) by mouth daily with supper. 06/21/18  Yes [place], Holli Humbles, NP  acetaminophen (TYLENOL) 650 MG CR tablet Take 2 tablets (1,300 mg total) by mouth every 8 (eight) hours as needed for pain. Patient not taking: Reported on 07/20/2018 04/03/18   [name], PA-C  amoxicillin-clavulanate (AUGMENTIN) 875-125 MG tablet Take 1 tablet by mouth 2 (two) times daily. 07/27/18   [place], Holli Humbles, NP  diclofenac sodium (VOLTAREN) 1 % GEL Apply 2 g topically 4 (four) times daily. Patient not taking: Reported on 08/01/2018 07/20/18   [name], MD  lenvatinib 18 mg daily dose (LENVIMA 18 MG DAILY DOSE) 10 & 4 (2) MG capsule Take 18 mg by mouth daily. Patient not taking: Reported on 08/01/2018 06/02/18   [name], MD  metoCLOPramide (REGLAN) 10 MG tablet Take 1 tablet (10 mg total) by mouth 4 (four) times daily -  before meals and at bedtime. 07/31/18   [name], MD  traMADol (ULTRAM) 50 MG tablet Take 2 tablets (100 mg total) by mouth at bedtime as needed for moderate pain. Patient not taking: Reported on 08/01/2018 06/16/18   [name], PA-C    Family History Family History  Problem Relation Age of Onset  . Heart disease Mother   . Diabetes Mother   . Heart disease Father   . Alcohol abuse Brother     Social History Social History   Tobacco Use  . Smoking status: Never Smoker    . Smokeless tobacco: Never Used  Substance Use Topics  . Alcohol use: Yes    Comment: social use in the past  . Drug use: No     Allergies   Feraheme [ferumoxytol]   Review of Systems Review of Systems  Constitutional: Positive for appetite change and fatigue. Negative for fever.  HENT: Negative for congestion.   Respiratory: Negative for shortness of breath.   Cardiovascular: Negative for chest pain.  Gastrointestinal: Positive for abdominal pain, constipation and nausea.  Genitourinary: Negative for flank pain.  Musculoskeletal: Positive for back pain.  Skin: Negative for rash.  Hematological: Negative for adenopathy.  Psychiatric/Behavioral: Negative for confusion.     Physical Exam Updated Vital Signs BP (!) 153/92 (BP Location: Left Arm)   Pulse 98  Temp 97.8 F (36.6 C) (Oral)   Resp 16   Ht 6' (1.829 m)   Wt 92.1 kg   SpO2 97%   BMI 27.53 kg/m   Physical Exam  Constitutional: He appears well-developed.  HENT:  Head: Atraumatic.  Eyes: EOM are normal.  Cardiovascular:  Mild tachycardia  Pulmonary/Chest: Breath sounds normal.  Abdominal: He exhibits no pulsatile midline mass.  No distention.  No rebound or guarding.  Neurological: He is alert.  Skin: Skin is warm. Capillary refill takes less than 2 seconds.     ED Treatments / Results  Labs (all labs ordered are listed, but only abnormal results are displayed) Labs Reviewed  LIPASE, BLOOD - Abnormal; Notable for the following components:      Result Value   Lipase 121 (*)    All other components within normal limits  COMPREHENSIVE METABOLIC PANEL - Abnormal; Notable for the following components:   Potassium 3.2 (*)    Glucose, Bld 112 (*)    Creatinine, Ser 0.40 (*)    Albumin 3.2 (*)    AST 187 (*)    ALT 183 (*)    Alkaline Phosphatase 802 (*)    Total Bilirubin 5.8 (*)    All other components within normal limits  CBC - Abnormal; Notable for the following components:   RBC 6.37 (*)     MCV 74.4 (*)    MCH 24.8 (*)    RDW 15.7 (*)    All other components within normal limits  URINALYSIS, ROUTINE W REFLEX MICROSCOPIC - Abnormal; Notable for the following components:   Color, Urine AMBER (*)    Glucose, UA 50 (*)    Hgb urine dipstick SMALL (*)    Bilirubin Urine MODERATE (*)    Ketones, ur 80 (*)    Protein, ur 30 (*)    Leukocytes, UA SMALL (*)    Bacteria, UA FEW (*)    All other components within normal limits    EKG None  Radiology Ct Abdomen Pelvis W Contrast  Result Date: 08/01/2018 CLINICAL DATA:  57 year old with abdominal distension and acute abdominal pain. History of metastatic renal cell carcinoma. EXAM: CT ABDOMEN AND PELVIS WITH CONTRAST TECHNIQUE: Multidetector CT imaging of the abdomen and pelvis was performed using the standard protocol following bolus administration of intravenous contrast. CONTRAST:  169mL ISOVUE-300 IOPAMIDOL (ISOVUE-300) INJECTION 61% COMPARISON:  PET-CT 04/28/2018 FINDINGS: Lower chest: There is a low-density necrotic lymph node in the subcarinal region that measures 2.4 cm in the short axis. There was disease in this area on the previous PET-CT. Dependent densities in the lower lobes are suggestive for atelectasis. No large pleural effusions. Hepatobiliary: There is new perihepatic ascites. 1.1 cm right hepatic lesion on sequence 2, image 16. This was not clearly present on the prior PET and could represent a new or recurrent hepatic lesion. There is periportal edema. There is edema or fluid around the gallbladder which contains multiple calcified stones. Mild gallbladder distension compared to the previous CT. There is no significant extrahepatic biliary dilatation. Main portal veins are patent. Pancreas: Unremarkable. No pancreatic ductal dilatation or surrounding inflammatory changes. Spleen: New small amount of perisplenic ascites. Otherwise, normal appearance of the spleen. Adrenals/Urinary Tract: Normal appearance of the right  adrenal gland. Question mild fullness of the left adrenal gland. Stable appearance of left kidney with a cortical cyst in the upper pole. Urinary bladder is unremarkable. Again noted is a large heterogeneous mass involving the right kidney lower pole. Mass has  not significantly changed in size measuring 9.8 x 8.5 cm on sequence 2, image 42. The mass abuts the C-loop of the duodenum. Stomach/Bowel: There is diffuse wall thickening involving the transverse segment of the duodenum. Wall thickening measures roughly 1.1 cm. No evidence for wall thickening in the proximal duodenum. No gross abnormality to the stomach. Again noted is mesenteric edema in the right central aspect of the abdomen. There is no evidence for a bowel obstruction. Vascular/Lymphatic: Mild atherosclerotic disease in the aorta without aneurysm. Again noted are enlarged periaortic lymph nodes which have not significantly changed. Left periaortic lymph node on sequence 2 image 40 measures 2 cm in the short axis and recent recently measured 1.9 cm on the PET-CT. Retrocrural lymph nodes have decreased in size. Left retrocrural lymph node on series 2, image 23 measures 1.1 cm in short axis and previously measured 2.0 cm. Reproductive: Prostate is unremarkable. Other: Small amount of pelvic ascites is new. Mild mesenteric edema. Small amount of ascites in the upper abdomen around the liver and spleen are new. Musculoskeletal: Again noted are lucent bone lesions involving L1 and L4. Lucent bone lesion involving the left ilium on sequence 2 image 66 measures 1.3 cm and recently measured 0.9 cm. Lucent lesion in the left iliac wing measures 0.9 cm and more conspicuous compared to the recent comparison examination. IMPRESSION: New wall thickening involving the third and fourth portions of the duodenum of uncertain etiology. This could be infectious or inflammatory. The primary renal tumor abuts the duodenum but tumor involvement in this area is thought to be  less likely. Metastatic renal cell carcinoma with a mixed response to therapy since the recent PET-CT. Concern for a new liver lesion and enlarging bone lesions in the left ilium. In addition, there is new ascites. However, the retrocrural lymph nodes have decreased in size. No significant change in the primary right renal lesion. Cholelithiasis with a small amount of pericholecystic edema or fluid. This pericholecystic fluid could be related to the ascites but limited evaluation for cholecystitis. If there is clinical concern for cholecystitis, recommend further characterization with a right upper quadrant ultrasound. Electronically Signed   By: Markus Daft M.D.   On: 08/01/2018 13:58    Procedures Procedures (including critical care time)  Medications Ordered in ED Medications  iopamidol (ISOVUE-300) 61 % injection (has no administration in time range)  sodium chloride 0.9 % bolus 1,000 mL (1,000 mLs Intravenous New Bag/Given 08/01/18 1158)  ondansetron (ZOFRAN) injection 4 mg (4 mg Intravenous Given 08/01/18 1158)  HYDROmorphone (DILAUDID) injection 1 mg (1 mg Intravenous Given 08/01/18 1159)  iopamidol (ISOVUE-300) 61 % injection 100 mL (100 mLs Intravenous Contrast Given 08/01/18 1304)     Initial Impression / Assessment and Plan / ED Course  I have reviewed the triage vital signs and the nursing notes.  Pertinent labs & imaging results that were available during my care of the patient were reviewed by me and considered in my medical decision making (see chart for details).     Patient with abdominal pain.  Has had for the last week.  LFTs somewhat elevated and bilirubin continues to elevate.  Mostly epigastric pain but does have some right upper quadrant pain.  Pain unrelieved with home medicines.  Lipase mildly elevated.  Has new ascites and potential duodenitis.  Has gallstones but CT scan somewhat limited due to ascites.  Does appear somewhat dehydrated.  Has had outpatient IV fluids and  continued poor oral intake.  Will admit to  hospitalist for further evaluation and treatment.  Will get ultrasound also to evaluate for potential cholecystitis.    Final Clinical Impressions(s) / ED Diagnoses   Final diagnoses:  Duodenitis  Dehydration    ED Discharge Orders    None       Davonna Belling, MD 08/01/18 1421

## 2018-08-02 ENCOUNTER — Inpatient Hospital Stay (HOSPITAL_COMMUNITY): Payer: BLUE CROSS/BLUE SHIELD | Admitting: Anesthesiology

## 2018-08-02 ENCOUNTER — Encounter (HOSPITAL_COMMUNITY): Payer: Self-pay | Admitting: Anesthesiology

## 2018-08-02 ENCOUNTER — Encounter (HOSPITAL_COMMUNITY)
Admission: EM | Disposition: A | Discharge status: Home / Self Care | Payer: Self-pay | Source: Home / Self Care | Attending: Internal Medicine

## 2018-08-02 DIAGNOSIS — C649 Malignant neoplasm of unspecified kidney, except renal pelvis: Secondary | ICD-10-CM

## 2018-08-02 DIAGNOSIS — K2980 Duodenitis without bleeding: Secondary | ICD-10-CM

## 2018-08-02 DIAGNOSIS — R945 Abnormal results of liver function studies: Secondary | ICD-10-CM

## 2018-08-02 DIAGNOSIS — R740 Nonspecific elevation of levels of transaminase and lactic acid dehydrogenase [LDH]: Secondary | ICD-10-CM

## 2018-08-02 DIAGNOSIS — K529 Noninfective gastroenteritis and colitis, unspecified: Secondary | ICD-10-CM

## 2018-08-02 DIAGNOSIS — C799 Secondary malignant neoplasm of unspecified site: Secondary | ICD-10-CM

## 2018-08-02 HISTORY — PX: ESOPHAGOGASTRODUODENOSCOPY: SHX5428

## 2018-08-02 HISTORY — PX: BIOPSY: SHX5522

## 2018-08-02 LAB — BASIC METABOLIC PANEL
Anion gap: 8 (ref 5–15)
BUN: 5 mg/dL — AB (ref 6–20)
CALCIUM: 8.7 mg/dL — AB (ref 8.9–10.3)
CHLORIDE: 105 mmol/L (ref 98–111)
CO2: 26 mmol/L (ref 22–32)
CREATININE: 0.41 mg/dL — AB (ref 0.61–1.24)
Glucose, Bld: 114 mg/dL — ABNORMAL HIGH (ref 70–99)
Potassium: 4.2 mmol/L (ref 3.5–5.1)
Sodium: 139 mmol/L (ref 135–145)

## 2018-08-02 LAB — GLUCOSE, CAPILLARY
GLUCOSE-CAPILLARY: 127 mg/dL — AB (ref 70–99)
Glucose-Capillary: 190 mg/dL — ABNORMAL HIGH (ref 70–99)

## 2018-08-02 LAB — CBC
HCT: 37.9 % — ABNORMAL LOW (ref 39.0–52.0)
HEMOGLOBIN: 11.9 g/dL — AB (ref 13.0–17.0)
MCH: 23.9 pg — AB (ref 26.0–34.0)
MCHC: 31.4 g/dL (ref 30.0–36.0)
MCV: 76.3 fL — AB (ref 78.0–100.0)
Platelets: 217 10*3/uL (ref 150–400)
RBC: 4.97 MIL/uL (ref 4.22–5.81)
RDW: 16.2 % — AB (ref 11.5–15.5)
WBC: 5.4 10*3/uL (ref 4.0–10.5)

## 2018-08-02 LAB — HEMOGLOBIN A1C
Hgb A1c MFr Bld: 6.1 % — ABNORMAL HIGH (ref 4.8–5.6)
Mean Plasma Glucose: 128.37 mg/dL

## 2018-08-02 LAB — HIV ANTIBODY (ROUTINE TESTING W REFLEX): HIV Screen 4th Generation wRfx: NONREACTIVE

## 2018-08-02 SURGERY — EGD (ESOPHAGOGASTRODUODENOSCOPY)
Anesthesia: Monitor Anesthesia Care

## 2018-08-02 MED ORDER — CIPROFLOXACIN IN D5W 400 MG/200ML IV SOLN
400.0000 mg | Freq: Two times a day (BID) | INTRAVENOUS | Status: DC
  Administered 2018-08-02 – 2018-08-04 (×5): 400 mg via INTRAVENOUS
  Filled 2018-08-02 (×5): qty 200

## 2018-08-02 MED ORDER — METHYLPREDNISOLONE SODIUM SUCC 125 MG IJ SOLR
80.0000 mg | Freq: Four times a day (QID) | INTRAMUSCULAR | Status: DC
  Administered 2018-08-02 – 2018-08-04 (×8): 80 mg via INTRAVENOUS
  Filled 2018-08-02 (×8): qty 2

## 2018-08-02 MED ORDER — LACTULOSE 10 GM/15ML PO SOLN
10.0000 g | ORAL | Status: AC
  Administered 2018-08-02: 10 g via ORAL
  Filled 2018-08-02: qty 30

## 2018-08-02 MED ORDER — PROPOFOL 10 MG/ML IV BOLUS
INTRAVENOUS | Status: AC
  Filled 2018-08-02: qty 40

## 2018-08-02 MED ORDER — PANCRELIPASE (LIP-PROT-AMYL) 12000-38000 UNITS PO CPEP
36000.0000 [IU] | ORAL_CAPSULE | Freq: Three times a day (TID) | ORAL | Status: DC
  Administered 2018-08-03 (×2): 36000 [IU] via ORAL
  Filled 2018-08-02 (×3): qty 3

## 2018-08-02 MED ORDER — INSULIN ASPART 100 UNIT/ML ~~LOC~~ SOLN
0.0000 [IU] | Freq: Three times a day (TID) | SUBCUTANEOUS | Status: DC
  Administered 2018-08-02: 2 [IU] via SUBCUTANEOUS
  Administered 2018-08-03 (×3): 3 [IU] via SUBCUTANEOUS
  Administered 2018-08-04: 2 [IU] via SUBCUTANEOUS

## 2018-08-02 MED ORDER — METRONIDAZOLE IN NACL 5-0.79 MG/ML-% IV SOLN
500.0000 mg | Freq: Three times a day (TID) | INTRAVENOUS | Status: DC
  Administered 2018-08-02 – 2018-08-04 (×7): 500 mg via INTRAVENOUS
  Filled 2018-08-02 (×7): qty 100

## 2018-08-02 MED ORDER — FENTANYL CITRATE (PF) 100 MCG/2ML IJ SOLN
INTRAMUSCULAR | Status: AC
  Filled 2018-08-02: qty 2

## 2018-08-02 MED ORDER — FENTANYL 25 MCG/HR TD PT72: 25.0000 ug | MEDICATED_PATCH | TRANSDERMAL | Status: DC

## 2018-08-02 MED ORDER — PROPOFOL 500 MG/50ML IV EMUL
INTRAVENOUS | Status: DC | PRN
  Administered 2018-08-02: 125 ug/kg/min via INTRAVENOUS

## 2018-08-02 MED ORDER — LACTATED RINGERS IV SOLN
INTRAVENOUS | Status: DC | PRN
  Administered 2018-08-02: 15:00:00 via INTRAVENOUS

## 2018-08-02 MED ORDER — MIDAZOLAM HCL 5 MG/ML IJ SOLN
INTRAMUSCULAR | Status: AC
  Filled 2018-08-02: qty 2

## 2018-08-02 MED ORDER — INSULIN ASPART 100 UNIT/ML ~~LOC~~ SOLN: 0.0000 [IU] | Freq: Every day | SUBCUTANEOUS | Status: DC

## 2018-08-02 MED ORDER — PROPOFOL 500 MG/50ML IV EMUL
INTRAVENOUS | Status: DC | PRN
  Administered 2018-08-02: 40 mg via INTRAVENOUS
  Administered 2018-08-02 (×2): 30 mg via INTRAVENOUS

## 2018-08-02 MED ORDER — FENTANYL 25 MCG/HR TD PT72
25.0000 ug | MEDICATED_PATCH | TRANSDERMAL | Status: DC
  Administered 2018-08-02: 25 ug via TRANSDERMAL
  Filled 2018-08-02: qty 1

## 2018-08-02 MED ORDER — INSULIN ASPART 100 UNIT/ML ~~LOC~~ SOLN: 0.0000 [IU] | SUBCUTANEOUS | Status: DC

## 2018-08-02 NOTE — Anesthesia Preprocedure Evaluation (Addendum)
Anesthesia Evaluation  Patient identified by MRN, date of birth, ID band Patient awake    Reviewed: Allergy & Precautions, NPO status , Patient's Chart, lab work & pertinent test results  Airway Mallampati: I  TM Distance: >3 FB Neck ROM: Full    Dental  (+) Teeth Intact, Dental Advisory Given   Pulmonary    breath sounds clear to auscultation       Cardiovascular negative cardio ROS   Rhythm:Regular Rate:Normal     Neuro/Psych negative neurological ROS  negative psych ROS   GI/Hepatic negative GI ROS, Neg liver ROS,   Endo/Other  diabetes, Type 2, Oral Hypoglycemic Agents  Renal/GU Renal disease     Musculoskeletal  (+) Arthritis , Osteoarthritis,    Abdominal Normal abdominal exam  (+)   Peds  Hematology negative hematology ROS (+)   Anesthesia Other Findings   Reproductive/Obstetrics                            Anesthesia Physical Anesthesia Plan  ASA: II  Anesthesia Plan: MAC   Post-op Pain Management:    Induction: Intravenous  PONV Risk Score and Plan: Propofol infusion  Airway Management Planned: Natural Airway  Additional Equipment:   Intra-op Plan:   Post-operative Plan:   Informed Consent: I have reviewed the patients History and Physical, chart, labs and discussed the procedure including the risks, benefits and alternatives for the proposed anesthesia with the patient or authorized representative who has indicated his/her understanding and acceptance.     Plan Discussed with: CRNA  Anesthesia Plan Comments:        Anesthesia Quick Evaluation

## 2018-08-02 NOTE — Anesthesia Postprocedure Evaluation (Signed)
Anesthesia Post Note  Patient: Alexander Fry  Procedure(s) Performed: ESOPHAGOGASTRODUODENOSCOPY (EGD) (N/A )     Patient location during evaluation: PACU Anesthesia Type: MAC Level of consciousness: awake and alert Pain management: pain level controlled Vital Signs Assessment: post-procedure vital signs reviewed and stable Respiratory status: spontaneous breathing, nonlabored ventilation, respiratory function stable and patient connected to nasal cannula oxygen Cardiovascular status: stable and blood pressure returned to baseline Postop Assessment: no apparent nausea or vomiting Anesthetic complications: no    Last Vitals:  Vitals:   08/02/18 1540 08/02/18 1550  BP: 128/76 (!) 148/82  Pulse: 81 76  Resp: (!) 22 16  Temp:    SpO2: 96% 99%    Last Pain:  Vitals:   08/02/18 1537  TempSrc: Oral  PainSc: 0-No pain                 Effie Berkshire

## 2018-08-02 NOTE — Op Note (Signed)
[facility] Patient Name: Alexander Fry Procedure Date: 08/02/2018 MRN: [id number] Attending MD: Juanita Craver , MD Date of Birth: Feb 07, 1961 CSN: [id number] Age: 57 Admit Type: Inpatient Procedure:                EGD with biopsies. Indications:              Periumbilical abdominal pain , Abnormal CT of the                            GI tract. Providers:                Juanita Craver, MD, Burtis Junes, RN, Tinnie Gens,                            Technician, Jenera Alday CRNA, CRNA Referring MD:             THP Medicines:                Monitored Anesthesia Care Complications:            No immediate complications. Estimated Blood Loss:     Estimated blood loss was minimal. Procedure:                Pre-anesthesia assessment: - Prior to the                            procedure, a history and physical was performed,                            and patient medications and allergies were                            reviewed. The patient's tolerance of previous                            anesthesia was also reviewed. The risks and                            benefits of the procedure and the sedation options                            and risks were discussed with the patient. All                            questions were answered, and informed consent was                            obtained. Prior anticoagulants: The patient has                            taken Xarelto (rivaroxaban), last dose was 1 day                            prior to procedure. ASA Grade Assessment: IV - A  patient with severe systemic disease that is a                            constant threat to life. After reviewing the risks                            and benefits, the patient was deemed in                            satisfactory condition to undergo the procedure.                            After obtaining informed consent, the endoscope was                            passed  under direct vision. Throughout the                            procedure, the patient's blood pressure, pulse, and                            oxygen saturations were monitored continuously. The                            GIF-H190 ([id number] Olympus adult endoscope was                            introduced through the mouth, and advanced to the                            second part of duodenum. The EGD was accomplished                            without difficulty. The patient tolerated the                            procedure well. Scope In: Scope Out: Findings:      The examined esophagus and the GEJ appeared widely patent and normal.      A small hiatal hernia was noted on retroflexion; the rest of the stomach       appeared normal.      The duodenal bulb and first portion of the duodenum were normal.      Patchy severely erythematous mucosa with active bleeding and with       stigmata of bleeding was found in the second portion of the       duodenum-biopsies done. Impression:               - Normal appearing, widely patent esophagus and GEJ.                           - Small hiatal hernia; otherwise normal appearing                            stomach.                           -  Normal duodenal bulb and first portion of the                            duodenum.                           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 Moderate Sedation:      MAC used.. Recommendation:           - Clear liquid diet today.                           - Continue present medications.                           - Await pathology results. Procedure Code(s):        --- Professional ---                           [phone], Esophagogastroduodenoscopy, flexible,                            transoral; with biopsy, single or multiple Diagnosis Code(s):        --- Professional ---                           I68.03, Periumbilical pain                            R93.3, Abnormal findings on diagnostic imaging of                            other parts of digestive tract                           K31.89, Other diseases of stomach and duodenum                           K44.9, Diaphragmatic hernia without obstruction or                            gangrene CPT copyright 2017 [employer]. All rights reserved. The codes documented in this report are preliminary and upon coder review may  be revised to meet current compliance requirements. Juanita Craver, MD Juanita Craver, MD 08/02/2018 3:46:13 PM This report has been signed electronically. Number of Addenda: 0

## 2018-08-02 NOTE — Care Management Note (Signed)
Case Management Note  Patient Details  Name: [name] MRN: [id number] Date of Birth: 04-08-1961  Subjective/Objective:   Pt admitted with elevated LFT's                  Action/Plan: Pt plan to discharge home with no needs at present time.  Expected Discharge Date:  (unknown)               Expected Discharge Plan:  Home/Self Care  In-House Referral:     Discharge planning Services  CM Consult  Post Acute Care Choice:    Choice offered to:     DME Arranged:    DME Agency:     HH Arranged:    HH Agency:     Status of Service:  In process, will continue to follow  If discussed at Long Length of Stay Meetings, dates discussed:    Additional CommentsPurcell Mouton, RN 08/02/2018, 11:39 AM

## 2018-08-02 NOTE — Transfer of Care (Signed)
Immediate Anesthesia Transfer of Care Note  Patient: Alexander Fry  Procedure(s) Performed: ESOPHAGOGASTRODUODENOSCOPY (EGD) (N/A )  Patient Location: PACU  Anesthesia Type:MAC  Level of Consciousness: sedated  Airway & Oxygen Therapy: Patient Spontanous Breathing and Patient connected to nasal cannula oxygen  Post-op Assessment: Report given to RN and Post -op Vital signs reviewed and stable  Post vital signs: Reviewed and stable  Last Vitals:  Vitals Value Taken Time  BP 130/75 08/02/2018  3:37 PM  Temp    Pulse 82 08/02/2018  3:37 PM  Resp 27 08/02/2018  3:37 PM  SpO2 97 % 08/02/2018  3:37 PM  Vitals shown include unvalidated device data.  Last Pain:  Vitals:   08/02/18 1537  TempSrc: Oral  PainSc: 0-No pain      Patients Stated Pain Goal: 3 (22/97/98 9211)  Complications: No apparent anesthesia complications

## 2018-08-02 NOTE — Progress Notes (Signed)
PROGRESS NOTE    Alexander Fry  TFT:[id number] DOB: March 13, 1961 DOA: 08/01/2018 PCP: [name], PA-C    Brief Narrative:  57 y.o. 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 He states he has bloating of his upper abdomen and has been belching but not passing gas from below. He did have a BM a few days ago which was very light in color. He oral intake has declined and he has lost about 8-10 lbs this week. He has not had vomiting but feels the food stays in his stomach and does not move forward like it should. He has felt chills but not feverish. His pain has been about 8/10 and radiates to his right flank.   Assessment & Plan:   Principal Problem:   Elevated LFTs Active Problems:   Benign prostatic hyperplasia with nocturia   Kidney cancer, primary, with metastasis from kidney to other site, right (HCC)   Transaminitis   Iron deficiency anemia due to chronic blood loss    Elevated LFTs/ epigastric and RUQ pain  - CT reviewed -  Patient had noted gray stool, RLQ pain with bloating and weigh loss -  GI consulted and pt is now s/p EGD 8/28 with unremarkable findings, possible some radiation duodenitis -  back on clear liquid diet per GI - On further questioning, patient reports no significant bowel movement for the past 1-2 weeks - Will give trial of lactulose    Kidney cancer, primary, with metastasis from kidney to other site, right  - has stopped Lenvima and Afinitor as of yesterday after discussion with Dr Marin Olp   - CT reveals new met in the liver and left hip bone- no pain in left hip - Appears to be stable at present  Hypokalemia - replaced - Repeat bmet in AM   H/o right IJ thrombus - cont Xarelto as tolerated  Iron deficiency anemia - received Iron infusions by Dr Marin Olp -Hgb currently 11.9  Constipation -Per above, no significant  bowel movement over the past 1-2 weeks -Will give trial of lactulose  DVT prophylaxis: xarelto Code Status: Full Family Communication: Pt in room, family not at bedside Disposition Plan: Uncertain at this time  Consultants:   GI  Procedures:   EGD 8/28  Antimicrobials: Anti-infectives (From admission, onward)   Start     Dose/Rate Route Frequency Ordered Stop   08/02/18 1000  ciprofloxacin (CIPRO) IVPB 400 mg     400 mg 200 mL/hr over 60 Minutes Intravenous Every 12 hours 08/02/18 0732     08/02/18 0800  metroNIDAZOLE (FLAGYL) IVPB 500 mg     500 mg 100 mL/hr over 60 Minutes Intravenous Every 8 hours 08/02/18 0732         Subjective: Complaining of abd pain  Objective: Vitals:   08/02/18 1505 08/02/18 1537 08/02/18 1540 08/02/18 1550  BP:  130/75 128/76 (!) 148/82  Pulse: 97 86 81 76  Resp: 17 17 (!) 22 16  Temp:  97.7 F (36.5 C)    TempSrc:  Oral    SpO2: 100% 95% 96% 99%  Weight:      Height:        Intake/Output Summary (Last 24 hours) at 08/02/2018 1613 Last data filed at 08/02/2018 1538 Gross per 24 hour  Intake 6212.35 ml  Output -  Net 6212.35 ml   Filed Weights   08/01/18 1024  08/01/18 1712 08/02/18 1359  Weight: 92.1 kg 92.1 kg 92.1 kg    Examination:  General exam: Appears calm and comfortable  Respiratory system: Clear to auscultation. Respiratory effort normal. Cardiovascular system: S1 & S2 heard, RRR Gastrointestinal system: distended, generally tender, pos BS Central nervous system: Alert and oriented. No focal neurological deficits. Extremities: Symmetric 5 x 5 power. Skin: No rashes, lesions  Psychiatry: Judgement and insight appear normal. Mood & affect appropriate.   Data Reviewed: I have personally reviewed following labs and imaging studies  CBC: Recent Labs  Lab 07/27/18 1306 07/31/18 0916 08/01/18 1150 08/02/18 0535  WBC 5.7 4.8 4.3 5.4  NEUTROABS 3.5 3.2  --   --   HGB 12.0* 12.3* 15.8 11.9*  HCT 37.4* 37.9* 47.4  37.9*  MCV 75.6* 74.9* 74.4* 76.3*  PLT 201 203 165 433   Basic Metabolic Panel: Recent Labs  Lab 07/27/18 1306 07/31/18 0916 08/01/18 1150 08/02/18 0535  NA 137 135 140 139  K 3.6 3.1* 3.2* 4.2  CL 103 98 99 105  CO2 30 27 27 26   GLUCOSE 156* 115 112* 114*  BUN 9 6* 7 5*  CREATININE 0.90 0.60 0.40* 0.41*  CALCIUM 9.2 9.6 9.3 8.7*   GFR: Estimated Creatinine Clearance: 117.7 mL/min (A) (by C-G formula based on SCr of 0.41 mg/dL (L)). Liver Function Tests: Recent Labs  Lab 07/27/18 1306 07/31/18 0916 08/01/18 1150  AST 84* 185* 187*  ALT 74* 151* 183*  ALKPHOS 489* 733* 802*  BILITOT 0.7 4.1* 5.8*  PROT 7.2 7.3 7.1  ALBUMIN 3.2* 3.2* 3.2*   Recent Labs  Lab 08/01/18 1150  LIPASE 121*   No results for input(s): AMMONIA in the last 168 hours. Coagulation Profile: No results for input(s): INR, PROTIME in the last 168 hours. Cardiac Enzymes: No results for input(s): CKTOTAL, CKMB, CKMBINDEX, TROPONINI in the last 168 hours. BNP (last 3 results) No results for input(s): PROBNP in the last 8760 hours. HbA1C: Recent Labs    08/02/18 0535  HGBA1C 6.1*   CBG: No results for input(s): GLUCAP in the last 168 hours. Lipid Profile: No results for input(s): CHOL, HDL, LDLCALC, TRIG, CHOLHDL, LDLDIRECT in the last 72 hours. Thyroid Function Tests: No results for input(s): TSH, T4TOTAL, FREET4, T3FREE, THYROIDAB in the last 72 hours. Anemia Panel: No results for input(s): VITAMINB12, FOLATE, FERRITIN, TIBC, IRON, RETICCTPCT in the last 72 hours. Sepsis Labs: No results for input(s): PROCALCITON, LATICACIDVEN in the last 168 hours.  No results found for this or any previous visit (from the past 240 hour(s)).   Radiology Studies: Ct Abdomen Pelvis W Contrast  Result Date: 08/01/2018 CLINICAL DATA:  57 year old with abdominal distension and acute abdominal pain. History of metastatic renal cell carcinoma. EXAM: CT ABDOMEN AND PELVIS WITH CONTRAST TECHNIQUE:  Multidetector CT imaging of the abdomen and pelvis was performed using the standard protocol following bolus administration of intravenous contrast. CONTRAST:  159m ISOVUE-300 IOPAMIDOL (ISOVUE-300) INJECTION 61% COMPARISON:  PET-CT 04/28/2018 FINDINGS: Lower chest: There is a low-density necrotic lymph node in the subcarinal region that measures 2.4 cm in the short axis. There was disease in this area on the previous PET-CT. Dependent densities in the lower lobes are suggestive for atelectasis. No large pleural effusions. Hepatobiliary: There is new perihepatic ascites. 1.1 cm right hepatic lesion on sequence 2, image 16. This was not clearly present on the prior PET and could represent a new or recurrent hepatic lesion. There is periportal edema. There is edema or fluid  around the gallbladder which contains multiple calcified stones. Mild gallbladder distension compared to the previous CT. There is no significant extrahepatic biliary dilatation. Main portal veins are patent. Pancreas: Unremarkable. No pancreatic ductal dilatation or surrounding inflammatory changes. Spleen: New small amount of perisplenic ascites. Otherwise, normal appearance of the spleen. Adrenals/Urinary Tract: Normal appearance of the right adrenal gland. Question mild fullness of the left adrenal gland. Stable appearance of left kidney with a cortical cyst in the upper pole. Urinary bladder is unremarkable. Again noted is a large heterogeneous mass involving the right kidney lower pole. Mass has not significantly changed in size measuring 9.8 x 8.5 cm on sequence 2, image 42. The mass abuts the C-loop of the duodenum. Stomach/Bowel: There is diffuse wall thickening involving the transverse segment of the duodenum. Wall thickening measures roughly 1.1 cm. No evidence for wall thickening in the proximal duodenum. No gross abnormality to the stomach. Again noted is mesenteric edema in the right central aspect of the abdomen. There is no  evidence for a bowel obstruction. Vascular/Lymphatic: Mild atherosclerotic disease in the aorta without aneurysm. Again noted are enlarged periaortic lymph nodes which have not significantly changed. Left periaortic lymph node on sequence 2 image 40 measures 2 cm in the short axis and recent recently measured 1.9 cm on the PET-CT. Retrocrural lymph nodes have decreased in size. Left retrocrural lymph node on series 2, image 23 measures 1.1 cm in short axis and previously measured 2.0 cm. Reproductive: Prostate is unremarkable. Other: Small amount of pelvic ascites is new. Mild mesenteric edema. Small amount of ascites in the upper abdomen around the liver and spleen are new. Musculoskeletal: Again noted are lucent bone lesions involving L1 and L4. Lucent bone lesion involving the left ilium on sequence 2 image 66 measures 1.3 cm and recently measured 0.9 cm. Lucent lesion in the left iliac wing measures 0.9 cm and more conspicuous compared to the recent comparison examination. IMPRESSION: New wall thickening involving the third and fourth portions of the duodenum of uncertain etiology. This could be infectious or inflammatory. The primary renal tumor abuts the duodenum but tumor involvement in this area is thought to be less likely. Metastatic renal cell carcinoma with a mixed response to therapy since the recent PET-CT. Concern for a new liver lesion and enlarging bone lesions in the left ilium. In addition, there is new ascites. However, the retrocrural lymph nodes have decreased in size. No significant change in the primary right renal lesion. Cholelithiasis with a small amount of pericholecystic edema or fluid. This pericholecystic fluid could be related to the ascites but limited evaluation for cholecystitis. If there is clinical concern for cholecystitis, recommend further characterization with a right upper quadrant ultrasound. Electronically Signed   By: Markus Daft M.D.   On: 08/01/2018 13:58    Scheduled  Meds: . feeding supplement  1 Container Oral TID BM  . fentaNYL  25 mcg Transdermal Q72H  . insulin aspart  0-15 Units Subcutaneous Q4H  . lipase/protease/amylase  36,000 Units Oral TID AC  . methylPREDNISolone (SOLU-MEDROL) injection  80 mg Intravenous Q6H  . rivaroxaban  10 mg Oral Q supper   Continuous Infusions: . 0.9 % NaCl with KCl 40 mEq / L 125 mL/hr (08/02/18 1301)  . ciprofloxacin 400 mg (08/02/18 1141)  . metronidazole 500 mg (08/02/18 0754)     LOS: 1 day   Marylu Lund, MD [facility] Pager [phone]  If 7PM-7AM, please contact night-coverage [email or web address] Password [facility] 08/02/2018, 4:13 PM

## 2018-08-02 NOTE — Anesthesia Procedure Notes (Addendum)
Procedure Name: MAC Date/Time: 08/02/2018 3:21 PM Performed by: Lollie Sails, CRNA Pre-anesthesia Checklist: Patient identified, Emergency Drugs available, Suction available, Patient being monitored and Timeout performed Oxygen Delivery Method: Nasal cannula

## 2018-08-02 NOTE — Consult Note (Signed)
Referral MD  Reason for Referral: Metastatic renal cell cancer; abdominal pain; elevated liver test  Chief Complaint  Patient presents with  . Abdominal Pain  . Constipation  : I cannot take the abdominal pain any longer.  HPI: Alexander Fry is well-known to me.  Is a 57 year old white male.  He was diagnosed with metastatic renal cell carcinoma back in January 2018.  He actually is done pretty well.  He recently was on Lenvima/Afinitor.  He tolerated this well initially.  However, he began to have abdominal pain.  His liver function test are to increase.  He was seen in the office on Monday.  His bilirubin was 4.1.  We gave him some IV fluids.  He did not improve.  He had some nausea and vomiting that night.  He went to the emergency room yesterday.  He had a CT scan done.  It showed thickening of the duodenum.  This was consistent with inflammation or infection.  He had a possible new lesion in the liver.  He had multiple gallstones and some thickening of the gallbladder wall.  He was admitted.  He was seen by gastroenterology.  He is undergoing an upper endoscopy today.  He has had no fever.  He has had no bleeding.  He has had no cough or shortness of breath.  He says the abdominal pain is doing better.  It appears that he probably has had a reaction to the Afinitor.  I had a couple other patients who have developed enteritis with Afinitor.  So far, he is on nothing for the duodenal thickening.  He has had no fever.  There is been no bleeding.  [name] has been on Xarelto.  He had a lower extremity thrombus.  There is been no headache.  Overall, his performance status is ECOG 2.    Past Medical History:  Diagnosis Date  . Bilateral primary osteoarthritis of knee 04/03/2018  . Goals of care, counseling/discussion 12/22/2016  . History of radiation therapy 02/11/2017   SRT right posterior frontal 12 mm target 20 Gy, Right anterior frontal 71mm 20 Gy  . History of radiation therapy  02/21/2017   SRT Right post parietal 36mm target 20 Gy, left Post parietal 10mm 20 Gy, Right Occipital 4 mm 20Gy, Left Occipital 4 mm 20 Gy  . History of radiation therapy 02/25/2017, 02/28/2017, 03/02/2017   SRT L1 spine 27 Gy 3 fractions, L4 Spine 27 Gy 3 fractions  . History of radiation therapy 06/07/2017   SRS- Brain  . History of radiation therapy 04/03/2018   Right ilium, 8 Gy in 1 fraction for a total dose of 8 Gy  . Inguinal hernia of left side without obstruction or gangrene   . Iron deficiency anemia due to chronic blood loss 05/23/2018  . Pneumonia    left lung  . Renal cell carcinoma, right ([place]) 01/05/2017  . 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 His vision has "spots" at times  :  Past Surgical History:  Procedure Laterality Date  . HERNIA REPAIR    . INGUINAL HERNIA REPAIR Left   . IR FLUORO GUIDE PORT INSERTION RIGHT  02/06/2018  . IR US GUIDE VASC ACCESS RIGHT  02/06/2018  :   Current Facility-Administered Medications:  .  0.9 % NaCl with KCl 40 mEq / L  infusion, , Intravenous, Continuous, Rizwan, Saima, MD, Last Rate: 125 mL/hr at 08/02/18 0600 .  acetaminophen (TYLENOL) tablet 650 mg, 650 mg,  Oral, Q6H PRN **OR** acetaminophen (TYLENOL) suppository 650 mg, 650 mg, Rectal, Q6H PRN, [name], MD .  feeding supplement (BOOST / RESOURCE BREEZE) liquid 1 Container, 1 Container, Oral, TID BM, [name], MD, 1 Container at 08/01/18 2009 .  [START ON 08/04/2018] fentaNYL (DURAGESIC - dosed mcg/hr) patch 25 mcg, 25 mcg, Transdermal, Q72H, Rizwan, Saima, MD .  HYDROmorphone (DILAUDID) injection 1 mg, 1 mg, Intravenous, Q3H PRN, [name], MD, 1 mg at 08/02/18 0528 .  HYDROmorphone (DILAUDID) tablet 4 mg, 4 mg, Oral, Q6H PRN, [name], MD, 4 mg at 08/01/18 2020 .  ondansetron (ZOFRAN) tablet 4 mg, 4 mg, Oral, Q6H PRN **OR** ondansetron (ZOFRAN) injection 4 mg, 4 mg, Intravenous, Q6H PRN, [name], MD, 4 mg at 08/01/18 2009 .  rivaroxaban  (XARELTO) tablet 10 mg, 10 mg, Oral, Q supper, Rizwan, Saima, MD, 10 mg at 08/01/18 1709:  . feeding supplement  1 Container Oral TID BM  . [START ON 08/04/2018] fentaNYL  25 mcg Transdermal Q72H  . rivaroxaban  10 mg Oral Q supper  :  Allergies  Allergen Reactions  . Feraheme [Ferumoxytol] Anaphylaxis and Other (See Comments)    Flush. Patient denies anaphylaxis.  :  Family History  Problem Relation Age of Onset  . Heart disease Mother   . Diabetes Mother   . Heart disease Father   . Alcohol abuse Brother   :  Social History   Socioeconomic History  . Marital status: Single    Spouse name: Not on file  . Number of children: Not on file  . Years of education: Not on file  . Highest education level: Not on file  Occupational History  . Not on file  Social Needs  . Financial resource strain: Not on file  . Food insecurity:    Worry: Not on file    Inability: Not on file  . Transportation needs:    Medical: Not on file    Non-medical: Not on file  Tobacco Use  . Smoking status: Never Smoker  . Smokeless tobacco: Never Used  Substance and Sexual Activity  . Alcohol use: Yes    Comment: social use in the past  . Drug use: No  . Sexual activity: Not Currently  Lifestyle  . Physical activity:    Days per week: Not on file    Minutes per session: Not on file  . Stress: Not on file  Relationships  . Social connections:    Talks on phone: Not on file    Gets together: Not on file    Attends religious service: Not on file    Active member of club or organization: Not on file    Attends meetings of clubs or organizations: Not on file    Relationship status: Not on file  . Intimate partner violence:    Fear of current or ex partner: Not on file    Emotionally abused: Not on file    Physically abused: Not on file    Forced sexual activity: Not on file  Other Topics Concern  . Not on file  Social History Narrative  . Not on file  :  Pertinent items are noted in  HPI.  Exam: As above Patient Vitals for the past 24 hrs:  BP Temp Temp src Pulse Resp SpO2 Height Weight  08/02/18 0440 (!) 144/84 98.7 F (37.1 C) Oral 82 18 97 % - -  08/01/18 2024 (!) 152/82 98.9 F (37.2 C) Oral 79 18 96 % - -  08/01/18 1712 (!) 149/83 98.8 F (37.1 C) Oral 78 18 96 % 5' 11.5" (1.816 m) 203 lb (92.1 kg)  08/01/18 1024 (!) 153/92 97.8 F (36.6 C) Oral 98 16 97 % 6' (1.829 m) 203 lb (92.1 kg)     Recent Labs    08/01/18 1150 08/02/18 0535  WBC 4.3 5.4  HGB 15.8 11.9*  HCT 47.4 37.9*  PLT 165 217   Recent Labs    08/01/18 1150 08/02/18 0535  NA 140 139  K 3.2* 4.2  CL 99 105  CO2 27 26  GLUCOSE 112* 114*  BUN 7 5*  CREATININE 0.40* 0.41*  CALCIUM 9.3 8.7*    Blood smear review: None  Pathology: None    Assessment and Plan: Mr. Alexander Fry is a 57 year old white male.  He has metastatic renal cell carcinoma.  He is on fourth line therapy.  Again I do believe that what is being seen on the CT scan with his duodenal inflammation is from the Afinitor.  I will get him on some steroids.  IV high-dose steroids would be helpful.  I do not think would be a bad idea to have him on some antibiotics for enteritis.  I am not sure if the gallstones and gallbladder are part of the pain issue.  I would not think that he is a candidate for any type of surgery.  The elevated LFTs are likely from his Lenvima.  I do not see anything that looks like biliary obstruction.  Again, it all might be from the gallbladder.  I agree with the supportive care right now.  I think we does have to try to let this enteritis settle down a little bit.  I appreciate the wonderful care that he will get from everybody up on [address].  Lattie Haw, MD  Romans 15:13

## 2018-08-03 ENCOUNTER — Inpatient Hospital Stay (HOSPITAL_COMMUNITY): Payer: BLUE CROSS/BLUE SHIELD

## 2018-08-03 DIAGNOSIS — R351 Nocturia: Secondary | ICD-10-CM

## 2018-08-03 DIAGNOSIS — N401 Enlarged prostate with lower urinary tract symptoms: Secondary | ICD-10-CM

## 2018-08-03 DIAGNOSIS — D500 Iron deficiency anemia secondary to blood loss (chronic): Secondary | ICD-10-CM

## 2018-08-03 LAB — CBC
HEMATOCRIT: 35.9 % — AB (ref 39.0–52.0)
Hemoglobin: 11.5 g/dL — ABNORMAL LOW (ref 13.0–17.0)
MCH: 24.2 pg — AB (ref 26.0–34.0)
MCHC: 32 g/dL (ref 30.0–36.0)
MCV: 75.4 fL — AB (ref 78.0–100.0)
Platelets: 170 10*3/uL (ref 150–400)
RBC: 4.76 MIL/uL (ref 4.22–5.81)
RDW: 16.4 % — ABNORMAL HIGH (ref 11.5–15.5)
WBC: 4.3 10*3/uL (ref 4.0–10.5)

## 2018-08-03 LAB — COMPREHENSIVE METABOLIC PANEL
ALBUMIN: 2.5 g/dL — AB (ref 3.5–5.0)
ALT: 164 U/L — AB (ref 0–44)
AST: 144 U/L — AB (ref 15–41)
Alkaline Phosphatase: 727 U/L — ABNORMAL HIGH (ref 38–126)
Anion gap: 9 (ref 5–15)
BUN: 8 mg/dL (ref 6–20)
CHLORIDE: 105 mmol/L (ref 98–111)
CO2: 25 mmol/L (ref 22–32)
Calcium: 9.2 mg/dL (ref 8.9–10.3)
Creatinine, Ser: 0.42 mg/dL — ABNORMAL LOW (ref 0.61–1.24)
GFR calc Af Amer: >60 mL/min (ref >60)
Glucose, Bld: 237 mg/dL — ABNORMAL HIGH (ref 70–99)
POTASSIUM: 4.5 mmol/L (ref 3.5–5.1)
SODIUM: 139 mmol/L (ref 135–145)
Total Bilirubin: 4.8 mg/dL — ABNORMAL HIGH (ref 0.3–1.2)
Total Protein: 6 g/dL — ABNORMAL LOW (ref 6.5–8.1)

## 2018-08-03 LAB — GLUCOSE, CAPILLARY
GLUCOSE-CAPILLARY: 163 mg/dL — AB (ref 70–99)
GLUCOSE-CAPILLARY: 157 mg/dL — AB (ref 70–99)
Glucose-Capillary: 166 mg/dL — ABNORMAL HIGH (ref 70–99)
Glucose-Capillary: 197 mg/dL — ABNORMAL HIGH (ref 70–99)

## 2018-08-03 MED ORDER — PANTOPRAZOLE SODIUM 40 MG PO TBEC
40.0000 mg | DELAYED_RELEASE_TABLET | Freq: Every day | ORAL | Status: DC
  Administered 2018-08-03 – 2018-08-04 (×2): 40 mg via ORAL
  Filled 2018-08-03 (×2): qty 1

## 2018-08-03 MED ORDER — SODIUM CHLORIDE 0.9 % IV SOLN
750.0000 mg | Freq: Once | INTRAVENOUS | Status: AC
  Administered 2018-08-03: 750 mg via INTRAVENOUS
  Filled 2018-08-03: qty 15

## 2018-08-03 MED ORDER — MAGNESIUM CITRATE PO SOLN
1.0000 | Freq: Once | ORAL | Status: AC
  Administered 2018-08-03: 1 via ORAL
  Filled 2018-08-03: qty 296

## 2018-08-03 MED ORDER — GADOBENATE DIMEGLUMINE 529 MG/ML IV SOLN
20.0000 mL | Freq: Once | INTRAVENOUS | Status: AC | PRN
  Administered 2018-08-03: 19 mL via INTRAVENOUS

## 2018-08-03 MED ORDER — BISACODYL 10 MG RE SUPP
10.0000 mg | Freq: Once | RECTAL | Status: AC
  Administered 2018-08-03: 10 mg via RECTAL
  Filled 2018-08-03: qty 1

## 2018-08-03 NOTE — Progress Notes (Signed)
Initial Nutrition Assessment  DOCUMENTATION CODES:   Non-severe (moderate) malnutrition in context of chronic illness  INTERVENTION:    Monitor for diet advancement/toleration  Carnation Instant Breakfast PO BID, each supplement provides 220 kcal and 13 grams of protein.   Recommend initiation of bowel regimen   NUTRITION DIAGNOSIS:   Moderate Malnutrition related to chronic illness, cancer and cancer related treatments as evidenced by percent weight loss, mild muscle depletion, energy intake < 75% for > or equal to 1 month.  GOAL:   Patient will meet greater than or equal to 90% of their needs  MONITOR:   PO intake, Supplement acceptance, Weight trends, Labs, Diet advancement  REASON FOR ASSESSMENT:   Malnutrition Screening Tool    ASSESSMENT:   Patient with PMH significant for metastatic renal cell carcinoma with bone/brain mets (stopped current treatment three days ago). Presents this admission with elevated LFTs and epigastric pain. CT reveals new met in the liver and left hip bone.    8/28- EGD unremarkable findings, radiation duodenitis?  Pt reports having a loss in appetite over the 2-3 weeks due to increasing abdominal pain and side effects from chemotherapy. States was unable to have a BM in the last week and this has prevented him from eating his typical meals. During this time period he would attempt to eat three meals daily that consisted of:   B- oatmeal, eggs L- deli meat sandwich with [place] or chicken D- restaurant [place] or [place] food (meat, vegetable, grain)  He claims he used to drink Ensure but cannot stand the taste so he switched to [employer] once daily. He denies taste changes or swallowing issues. Pt is currently on a clear liquid diet consuming 50-100% of his last three meals. He is amendable to carnation instant breakfast with milk once diet is advanced.   Pt endorses a UBW of 210 lb, the last time being at that weight one  week ago. Records indicate pt weighed  227 lb on 05/23/18 and 203 lb this admission (10.8% wt loss in two months, significant for time frame). Nutrition-Focused physical exam completed.   Medications reviewed and include: creon 36,000 units TID, solumedrol, NS-40 mEq KCl @ 125 ml/hr Labs reviewed: CBG 237 (H) AST 144 (H) ALT 164 (H)   NUTRITION - FOCUSED PHYSICAL EXAM:    Most Recent Value  Orbital Region  No depletion  Upper Arm Region  Mild depletion  Thoracic and Lumbar Region  No depletion  Buccal Region  No depletion  Temple Region  Mild depletion  Clavicle Bone Region  Mild depletion  Clavicle and Acromion Bone Region  No depletion  Scapular Bone Region  No depletion  Dorsal Hand  No depletion  Patellar Region  Mild depletion  Anterior Thigh Region  Mild depletion  Posterior Calf Region  Mild depletion  Edema (RD Assessment)  None     Diet Order:   Diet Order            Diet regular Room service appropriate? Yes; Fluid consistency: Thin  Diet effective now              EDUCATION NEEDS:   Education needs have been addressed  Skin:  Skin Assessment: Reviewed RN Assessment  Last BM:  07/29/18  Height:   Ht Readings from Last 1 Encounters:  08/02/18 5' 11.5" (1.816 m)    Weight:   Wt Readings from Last 1 Encounters:  08/02/18 92.1 kg    Ideal Body Weight:  80.9 kg  BMI:  Body mass index is 27.92 kg/m.  Estimated Nutritional Needs:   Kcal:  2000-2200 kcal  Protein:  100-115 grams   Fluid:  >/= 2 L/day  Mariana Single RD, LDN Clinical Nutrition Pager # - [phone]

## 2018-08-03 NOTE — Progress Notes (Signed)
PROGRESS NOTE    Alexander Fry  EHU:[id number] DOB: 1961/07/30 DOA: 08/01/2018 PCP: [name], PA-C    Brief Narrative:  57 y.o. 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 He states he has bloating of his upper abdomen and has been belching but not passing gas from below. He did have a BM a few days ago which was very light in color. He oral intake has declined and he has lost about 8-10 lbs this week. He has not had vomiting but feels the food stays in his stomach and does not move forward like it should. He has felt chills but not feverish. His pain has been about 8/10 and radiates to his right flank.   Assessment & Plan:   Principal Problem:   Elevated LFTs Active Problems:   Benign prostatic hyperplasia with nocturia   Kidney cancer, primary, with metastasis from kidney to other site, right (HCC)   Transaminitis   Iron deficiency anemia due to chronic blood loss    Elevated LFTs/ epigastric and RUQ pain  - CT reviewed personally -  Patient had noted gray stool, RLQ pain with bloating and weigh loss -  GI consulted and pt is now s/p EGD 8/28 with unremarkable findings, possible some radiation duodenitis. Also consideration for chemo induced changes - Discussed with GI. Recommendation to complete total 7 days course of current abx  -  back on clear liquid diet per GI - On further questioning, patient reports no significant bowel movement for the past 1-2 weeks. No results with lactulose - Will give trial of Mg citrate. Labs reviewed. Renal function normal - MRI abd ordered by Dr. Marin Olp, pending study    Kidney cancer, primary, with metastasis from kidney to other site, right  - has stopped Lenvima and Afinitor as of yesterday after discussion with Dr Marin Olp   - CT reveals new met in the liver and left hip bone- no pain in left hip - Currently  stable at this time  Hypokalemia - replaced - Labs reviewed. Stable. Repeat bmet in AM   H/o right IJ thrombus - cont Xarelto as tolerated - No signs of acute blood loss  Iron deficiency anemia - received Iron infusions by Dr Marin Olp -Hgb currently 11.5, stable from prior  Constipation -Per above, no significant bowel movement over the past 1-2 weeks. No results with lactulose -Will give trial of magnesium citrate  DVT prophylaxis: xarelto Code Status: Full Family Communication: Pt in room, family not at bedside Disposition Plan: Uncertain at this time  Consultants:   GI  Procedures:   EGD 8/28  Antimicrobials: Anti-infectives (From admission, onward)   Start     Dose/Rate Route Frequency Ordered Stop   08/02/18 1000  ciprofloxacin (CIPRO) IVPB 400 mg     400 mg 200 mL/hr over 60 Minutes Intravenous Every 12 hours 08/02/18 0732     08/02/18 0800  metroNIDAZOLE (FLAGYL) IVPB 500 mg     500 mg 100 mL/hr over 60 Minutes Intravenous Every 8 hours 08/02/18 0732        Subjective: States abd pain has improved  Objective: Vitals:   08/02/18 1550 08/02/18 2132 08/03/18 0507 08/03/18 1229  BP: (!) 148/82 (!) 149/85 133/77 130/73  Pulse: 76 86 72 72  Resp: _0 Temp:  99.3 F (37.4 C) 98.1 F (36.7 C) 98.1 F (36.7 C)  TempSrc:  Oral Oral Oral  SpO2: 99% 94% 94% 94%  Weight:      Height:        Intake/Output Summary (Last 24 hours) at 08/03/2018 1245 Last data filed at 08/03/2018 7858 Gross per 24 hour  Intake 2996.81 ml  Output -  Net 2996.81 ml   Filed Weights   08/01/18 1024 08/01/18 1712 08/02/18 1359  Weight: 92.1 kg 92.1 kg 92.1 kg    Examination: General exam: Awake, laying in bed, in nad Respiratory system: Normal respiratory effort, no wheezing Cardiovascular system: regular rate, s1, s2 Gastrointestinal system: Soft, nondistended, positive BS Central nervous system: CN2-12 grossly intact, strength intact Extremities: Perfused, no  clubbing Skin: Normal skin turgor, no notable skin lesions seen Psychiatry: Mood normal // no visual hallucinations   Data Reviewed: I have personally reviewed following labs and imaging studies  CBC: Recent Labs  Lab 07/27/18 1306 07/31/18 0916 08/01/18 1150 08/02/18 0535 08/03/18 0429  WBC 5.7 4.8 4.3 5.4 4.3  NEUTROABS 3.5 3.2  --   --   --   HGB 12.0* 12.3* 15.8 11.9* 11.5*  HCT 37.4* 37.9* 47.4 37.9* 35.9*  MCV 75.6* 74.9* 74.4* 76.3* 75.4*  PLT 201 203 165 217 850   Basic Metabolic Panel: Recent Labs  Lab 07/27/18 1306 07/31/18 0916 08/01/18 1150 08/02/18 0535 08/03/18 0429  NA 137 135 140 139 139  K 3.6 3.1* 3.2* 4.2 4.5  CL 103 98 99 105 105  CO2 _0 GLUCOSE 156* 115 112* 114* 237*  BUN 9 6* 7 5* 8  CREATININE 0.90 0.60 0.40* 0.41* 0.42*  CALCIUM 9.2 9.6 9.3 8.7* 9.2   GFR: Estimated Creatinine Clearance: 117.7 mL/min (A) (by C-G formula based on SCr of 0.42 mg/dL (L)). Liver Function Tests: Recent Labs  Lab 07/27/18 1306 07/31/18 0916 08/01/18 1150 08/03/18 0429  AST 84* 185* 187* 144*  ALT 74* 151* 183* 164*  ALKPHOS 489* 733* 802* 727*  BILITOT 0.7 4.1* 5.8* 4.8*  PROT 7.2 7.3 7.1 6.0*  ALBUMIN 3.2* 3.2* 3.2* 2.5*   Recent Labs  Lab 08/01/18 1150  LIPASE 121*   No results for input(s): AMMONIA in the last 168 hours. Coagulation Profile: No results for input(s): INR, PROTIME in the last 168 hours. Cardiac Enzymes: No results for input(s): CKTOTAL, CKMB, CKMBINDEX, TROPONINI in the last 168 hours. BNP (last 3 results) No results for input(s): PROBNP in the last 8760 hours. HbA1C: Recent Labs    08/02/18 0535  HGBA1C 6.1*   CBG: Recent Labs  Lab 08/02/18 1634 08/02/18 2129 08/03/18 0743 08/03/18 1147  GLUCAP 127* 190* 163* 166*   Lipid Profile: No results for input(s): CHOL, HDL, LDLCALC, TRIG, CHOLHDL, LDLDIRECT in the last 72 hours. Thyroid Function Tests: No results for input(s): TSH, T4TOTAL, FREET4, T3FREE,  THYROIDAB in the last 72 hours. Anemia Panel: No results for input(s): VITAMINB12, FOLATE, FERRITIN, TIBC, IRON, RETICCTPCT in the last 72 hours. Sepsis Labs: No results for input(s): PROCALCITON, LATICACIDVEN in the last 168 hours.  No results found for this or any previous visit (from the past 240 hour(s)).   Radiology Studies: Ct Abdomen Pelvis W Contrast  Result Date: 08/01/2018 CLINICAL DATA:  57 year old with abdominal distension and acute abdominal pain. History of metastatic renal cell carcinoma. EXAM: CT ABDOMEN AND PELVIS WITH CONTRAST TECHNIQUE: Multidetector CT imaging of the abdomen and pelvis was performed using the standard protocol following bolus administration of intravenous contrast. CONTRAST:  157m ISOVUE-300 IOPAMIDOL (ISOVUE-300)  INJECTION 61% COMPARISON:  PET-CT 04/28/2018 FINDINGS: Lower chest: There is a low-density necrotic lymph node in the subcarinal region that measures 2.4 cm in the short axis. There was disease in this area on the previous PET-CT. Dependent densities in the lower lobes are suggestive for atelectasis. No large pleural effusions. Hepatobiliary: There is new perihepatic ascites. 1.1 cm right hepatic lesion on sequence 2, image 16. This was not clearly present on the prior PET and could represent a new or recurrent hepatic lesion. There is periportal edema. There is edema or fluid around the gallbladder which contains multiple calcified stones. Mild gallbladder distension compared to the previous CT. There is no significant extrahepatic biliary dilatation. Main portal veins are patent. Pancreas: Unremarkable. No pancreatic ductal dilatation or surrounding inflammatory changes. Spleen: New small amount of perisplenic ascites. Otherwise, normal appearance of the spleen. Adrenals/Urinary Tract: Normal appearance of the right adrenal gland. Question mild fullness of the left adrenal gland. Stable appearance of left kidney with a cortical cyst in the upper pole.  Urinary bladder is unremarkable. Again noted is a large heterogeneous mass involving the right kidney lower pole. Mass has not significantly changed in size measuring 9.8 x 8.5 cm on sequence 2, image 42. The mass abuts the C-loop of the duodenum. Stomach/Bowel: There is diffuse wall thickening involving the transverse segment of the duodenum. Wall thickening measures roughly 1.1 cm. No evidence for wall thickening in the proximal duodenum. No gross abnormality to the stomach. Again noted is mesenteric edema in the right central aspect of the abdomen. There is no evidence for a bowel obstruction. Vascular/Lymphatic: Mild atherosclerotic disease in the aorta without aneurysm. Again noted are enlarged periaortic lymph nodes which have not significantly changed. Left periaortic lymph node on sequence 2 image 40 measures 2 cm in the short axis and recent recently measured 1.9 cm on the PET-CT. Retrocrural lymph nodes have decreased in size. Left retrocrural lymph node on series 2, image 23 measures 1.1 cm in short axis and previously measured 2.0 cm. Reproductive: Prostate is unremarkable. Other: Small amount of pelvic ascites is new. Mild mesenteric edema. Small amount of ascites in the upper abdomen around the liver and spleen are new. Musculoskeletal: Again noted are lucent bone lesions involving L1 and L4. Lucent bone lesion involving the left ilium on sequence 2 image 66 measures 1.3 cm and recently measured 0.9 cm. Lucent lesion in the left iliac wing measures 0.9 cm and more conspicuous compared to the recent comparison examination. IMPRESSION: New wall thickening involving the third and fourth portions of the duodenum of uncertain etiology. This could be infectious or inflammatory. The primary renal tumor abuts the duodenum but tumor involvement in this area is thought to be less likely. Metastatic renal cell carcinoma with a mixed response to therapy since the recent PET-CT. Concern for a new liver lesion and  enlarging bone lesions in the left ilium. In addition, there is new ascites. However, the retrocrural lymph nodes have decreased in size. No significant change in the primary right renal lesion. Cholelithiasis with a small amount of pericholecystic edema or fluid. This pericholecystic fluid could be related to the ascites but limited evaluation for cholecystitis. If there is clinical concern for cholecystitis, recommend further characterization with a right upper quadrant ultrasound. Electronically Signed   By: Markus Daft M.D.   On: 08/01/2018 13:58    Scheduled Meds: . fentaNYL  25 mcg Transdermal Q72H  . insulin aspart  0-15 Units Subcutaneous TID WC  . insulin  aspart  0-5 Units Subcutaneous QHS  . methylPREDNISolone (SOLU-MEDROL) injection  80 mg Intravenous Q6H  . rivaroxaban  10 mg Oral Q supper   Continuous Infusions: . 0.9 % NaCl with KCl 40 mEq / L 125 mL/hr (08/03/18 0122)  . ciprofloxacin 400 mg (08/03/18 1123)  . metronidazole 500 mg (08/03/18 0839)     LOS: 2 days   Marylu Lund, MD [facility] Pager [phone]  If 7PM-7AM, please contact night-coverage [email or web address] Password [facility] 08/03/2018, 12:45 PM

## 2018-08-03 NOTE — Progress Notes (Signed)
Subjective: Feeling better.  Less abdominal pain.  Objective: Vital signs in last 24 hours: Temp:  [97.7 F (36.5 C)-99.3 F (37.4 C)] 98.1 F (36.7 C) (08/29 1229) Pulse Rate:  [72-99] 72 (08/29 1229) Resp:  [16-22] 16 (08/29 0507) BP: (128-160)/(73-85) 130/73 (08/29 1229) SpO2:  [94 %-100 %] 94 % (08/29 1229) Last BM Date: 07/29/18  Intake/Output from previous day: 08/28 0701 - 08/29 0700 In: 3219.5 [P.O.:940; I.V.:802; IV Piggyback:1477.6] Out: -  Intake/Output this shift: Total I/O In: 120 [P.O.:120] Out: -   General appearance: alert and no distress GI: soft, non-tender; bowel sounds normal; no masses,  no organomegaly  Lab Results: Recent Labs    08/01/18 1150 08/02/18 0535 08/03/18 0429  WBC 4.3 5.4 4.3  HGB 15.8 11.9* 11.5*  HCT 47.4 37.9* 35.9*  PLT 165 217 170   BMET Recent Labs    08/01/18 1150 08/02/18 0535 08/03/18 0429  NA 140 139 139  K 3.2* 4.2 4.5  CL 99 105 105  CO2 27 26 25   GLUCOSE 112* 114* 237*  BUN 7 5* 8  CREATININE 0.40* 0.41* 0.42*  CALCIUM 9.3 8.7* 9.2   LFT Recent Labs    08/03/18 0429  PROT 6.0*  ALBUMIN 2.5*  AST 144*  ALT 164*  ALKPHOS 727*  BILITOT 4.8*   PT/INR No results for input(s): LABPROT, INR in the last 72 hours. Hepatitis Panel No results for input(s): HEPBSAG, HCVAB, HEPAIGM, HEPBIGM in the last 72 hours. C-Diff No results for input(s): CDIFFTOX in the last 72 hours. Fecal Lactopherrin No results for input(s): FECLLACTOFRN in the last 72 hours.  Studies/Results: No results found.  Medications:  Scheduled: . fentaNYL  25 mcg Transdermal Q72H  . insulin aspart  0-15 Units Subcutaneous TID WC  . insulin aspart  0-5 Units Subcutaneous QHS  . methylPREDNISolone (SOLU-MEDROL) injection  80 mg Intravenous Q6H  . rivaroxaban  10 mg Oral Q supper   Continuous: . 0.9 % NaCl with KCl 40 mEq / L 125 mL/hr (08/03/18 0122)  . ciprofloxacin 400 mg (08/03/18 1123)  . metronidazole 500 mg (08/03/18 0839)     Assessment/Plan: 1) Duodenitis/duodenal ulcers. 2) Metastatic renal cell carcinoma.   The patient is clinically better.  The duodenal biopsies were nonspecific, but confirmed the gross appearance.  Per Dr. Marin Olp, the duodenitis is most likely from Afinitor, which he will not receive.  He is better with pantoprazole and Cipro/Flagyl.  Plan: 1) Continue with Flagyl and Cipro x 7 days. 2) Maintain PPI x 1 month. 3) Advance to a regular diet. 4) ? D/C home tomorrow.  LOS: 2 days   Tanayah Squitieri D 08/03/2018, 2:38 PM

## 2018-08-03 NOTE — Progress Notes (Signed)
Alexander Fry looks a lot better this morning.  I am unsure what made it feel better.  He said after his endoscopy, he belched and felt a whole lot better.  The endoscopy was incredibly helpful.  To no surprise, Dr. Collene Mares did a great job.  She found severe inflammation in the duodenum.  Biopsies were taken.  I again had to believe that this is from his Afinitor.  He is on steroids now.  Hopefully, this will help.  He is also on Cipro and Flagyl.  His liver function tests are improving.  He is not having nearly as much discomfort.  He still on a liquid diet.  Hopefully, this will be gradually increased.  He is not having any diarrhea.  His labs do show a blood sugar 237.  His lipase was not done today.  It was elevated yesterday.  His bilirubin is 4.8.  His ST PT is 164 and SGOT 144.  Alkaline phosphatase is 727.  On his exam, his vital signs all look stable.  His temperature is 98.1.  Pulse 72.  Blood pressure 133/77.  His abdomen is soft.  There is no distention.  He is not as tender.  I do not palpate any fluid wave.  There is no palpable hepatomegaly.  Lungs are clear.  Cardiac exam regular rate and rhythm.  Alexander Fry has metastatic renal cell carcinoma.  He was on Lenvima/Afinitor.  He, in my mind, has had a response.  The CT scans seem to show some improvement.  Is unclear whether or not he has liver mets.  I think that an MRI of the liver would be a good idea.  Unfortunately, we will not be able to give him any additional Lenvima/Afinitor.  He really seems to have trouble with the oral medications that we have tried him on.  He has had trouble with immunotherapy.  Our options are just not that great.  We will continue to follow along.  Hopefully, the liver test will continue to improve.  If so, we will start tapering down the Solu-Medrol.  I am just glad to see that he is doing better.  It is apparent that he is getting fantastic care from everybody up on [address] [place], [place]  [phone]

## 2018-08-04 ENCOUNTER — Encounter (HOSPITAL_COMMUNITY): Payer: Self-pay | Admitting: Gastroenterology

## 2018-08-04 DIAGNOSIS — E860 Dehydration: Secondary | ICD-10-CM

## 2018-08-04 DIAGNOSIS — E440 Moderate protein-calorie malnutrition: Secondary | ICD-10-CM

## 2018-08-04 LAB — GLUCOSE, CAPILLARY
Glucose-Capillary: 145 mg/dL — ABNORMAL HIGH (ref 70–99)
Glucose-Capillary: 191 mg/dL — ABNORMAL HIGH (ref 70–99)

## 2018-08-04 LAB — CBC WITH DIFFERENTIAL/PLATELET
Basophils Absolute: 0 K/uL (ref 0.0–0.1)
Basophils Relative: 0 %
Eosinophils Absolute: 0 K/uL (ref 0.0–0.7)
Eosinophils Relative: 0 %
HCT: 34.8 % — ABNORMAL LOW (ref 39.0–52.0)
Hemoglobin: 10.9 g/dL — ABNORMAL LOW (ref 13.0–17.0)
Lymphocytes Relative: 6 %
Lymphs Abs: 0.7 K/uL (ref 0.7–4.0)
MCH: 24.1 pg — ABNORMAL LOW (ref 26.0–34.0)
MCHC: 31.3 g/dL (ref 30.0–36.0)
MCV: 76.8 fL — ABNORMAL LOW (ref 78.0–100.0)
Monocytes Absolute: 0.4 K/uL (ref 0.1–1.0)
Monocytes Relative: 3 %
Neutro Abs: 10.6 K/uL — ABNORMAL HIGH (ref 1.7–7.7)
Neutrophils Relative %: 91 %
Platelets: 212 K/uL (ref 150–400)
RBC: 4.53 MIL/uL (ref 4.22–5.81)
RDW: 16.9 % — ABNORMAL HIGH (ref 11.5–15.5)
WBC: 11.7 K/uL — ABNORMAL HIGH (ref 4.0–10.5)

## 2018-08-04 LAB — COMPREHENSIVE METABOLIC PANEL
ALBUMIN: 2.4 g/dL — AB (ref 3.5–5.0)
ALT: 143 U/L — ABNORMAL HIGH (ref 0–44)
AST: 90 U/L — AB (ref 15–41)
Alkaline Phosphatase: 630 U/L — ABNORMAL HIGH (ref 38–126)
Anion gap: 9 (ref 5–15)
BUN: 9 mg/dL (ref 6–20)
CHLORIDE: 104 mmol/L (ref 98–111)
CO2: 27 mmol/L (ref 22–32)
Calcium: 8.9 mg/dL (ref 8.9–10.3)
Creatinine, Ser: 0.57 mg/dL — ABNORMAL LOW (ref 0.61–1.24)
GFR calc Af Amer: >60 mL/min (ref >60)
GFR calc non Af Amer: >60 mL/min (ref >60)
GLUCOSE: 173 mg/dL — AB (ref 70–99)
POTASSIUM: 4.2 mmol/L (ref 3.5–5.1)
Sodium: 140 mmol/L (ref 135–145)
TOTAL PROTEIN: 5.7 g/dL — AB (ref 6.5–8.1)
Total Bilirubin: 1.9 mg/dL — ABNORMAL HIGH (ref 0.3–1.2)

## 2018-08-04 MED ORDER — SODIUM CHLORIDE 0.9% FLUSH
10.0000 mL | INTRAVENOUS | Status: DC | PRN
  Administered 2018-08-04 (×2): 10 mL
  Filled 2018-08-04: qty 40

## 2018-08-04 MED ORDER — METRONIDAZOLE 500 MG PO TABS: 500.0000 mg | ORAL_TABLET | Freq: Three times a day (TID) | ORAL | 0 refills | Status: DC

## 2018-08-04 MED ORDER — PANTOPRAZOLE SODIUM 40 MG PO TBEC: 40.0000 mg | DELAYED_RELEASE_TABLET | Freq: Every day | ORAL | 0 refills | Status: AC

## 2018-08-04 MED ORDER — DOCUSATE SODIUM 100 MG PO CAPS: 100.0000 mg | ORAL_CAPSULE | Freq: Two times a day (BID) | ORAL | 0 refills | Status: AC

## 2018-08-04 MED ORDER — HEPARIN SOD (PORK) LOCK FLUSH 100 UNIT/ML IV SOLN
500.0000 [IU] | INTRAVENOUS | Status: AC | PRN
  Administered 2018-08-04: 500 [IU]

## 2018-08-04 MED ORDER — POLYETHYLENE GLYCOL 3350 17 G PO PACK: 17.0000 g | PACK | Freq: Every day | ORAL | 0 refills | Status: AC

## 2018-08-04 MED ORDER — CIPROFLOXACIN HCL 500 MG PO TABS: 500.0000 mg | ORAL_TABLET | Freq: Two times a day (BID) | ORAL | 0 refills | Status: DC

## 2018-08-04 NOTE — Progress Notes (Signed)
Mr. Rauf is really making some progress.  He does look much better.  His liver tests are coming back to normal quite nicely.  His bilirubin is 1.7.  The biopsy on his duodenum came back as an ulcer.  He had a MRI of his liver yesterday.  Results not yet back.  Hopefully, he will go home today.  He is on a regular diet.  He seems to be doing quite well with this.  He had a normal bowel movement this morning.  He has had no fever.  He says his urine has cleared up.  It is not dark.  He is still on steroids.  I will see about cutting the steroids out.  He is only been on these for a couple days.  He has had very little abdominal pain.  He did get some IV iron yesterday.  His labs show white cell count of 11.7.  Hemoglobin 10.9.  Platelet count 212,000.  His bilirubin is 1.9.  SGPT 143 SGOT 90.  Alkaline phosphatase 630.  Creatinine 0.57.  His physical exam shows a temperature of 97.9.  Pulse 70.  Blood pressure 127/72.  Head neck exam shows no scleral icterus.  He has no adenopathy.  Lungs are clear.  Cardiac exam regular rate and rhythm.  Abdomen is soft.  He has much less tenderness in the epigastric area.  There is no fluid wave.  There is no palpable liver or spleen tip.  Extremities shows no clubbing, cyanosis or edema.  Hopefully, Mr. Bozza can go home today.  I think he would like this.  GI says he needs to still be on Cipro and Flagyl.  I would tend to agree with this.  He will keep his follow-up appointment with me.  He is supposed to be having a PET scan in a couple weeks.  I know this is been ordered.  I appreciate the outstanding care they got from everybody up on [place], [place]  Oswaldo Milian 61:1

## 2018-08-04 NOTE — Progress Notes (Signed)
RN went over discharge instructions prior to d/c. Patient had no questions or concerns at this time.

## 2018-08-04 NOTE — Discharge Summary (Signed)
Physician Discharge Summary  Alexander Fry SPQ:[id number] DOB: [birth year] DOA: 08/01/2018  PCP: [name], PA-C  Admit date: 08/01/2018 Discharge date: 08/04/2018  Admitted From: Home Disposition:  Home  Recommendations for Outpatient Follow-up:  1. Follow up with PCP in 1-2 weeks 2. Follow up with Dr. Marin Olp as scheduled 3. Follow up with Dr. 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 He states he has bloating of his upper abdomen and has been belching but not passing gas from below. He did have a BM a few days ago which was very light in color. He oral intake has declined and he has lost about 8-10 lbs this week. He has not had vomiting but feels the food stays in his stomach and does not move forward like it should. He has felt chills but not feverish. His pain has been about 8/10 and radiates to his right flank.  Elevated LFTs/ epigastric and RUQ pain  - CT reviewed personally - Patient had noted gray stool, RLQ pain with bloating and weigh loss - GI consulted and pt is now s/p EGD 8/28 with unremarkable findings, possible some radiation duodenitis. Also consideration for chemo induced changes - Discussed with GI. Recommendation to complete total 7 days course of current abx  - On further questioning, patient reports no significant bowel movement for the past 1-2 weeks prior to admit. No results with lactulose - Good results with Mg citrate with dulcolax suppository  - MRI abd ordered by Dr. Marin Olp. Finding of lesion involving R hepatic lobe consistent with cyst or small hemangioma. Also stable R renal mass noted. Will have patient follow up with Oncology as scheduled  Kidney cancer, primary, with metastasis from  kidney to other site, right  - has stopped Lenvima and Afinitor as of yesterday after discussion with Dr Marin Olp  - CT reveals new met in the liver and left hip bone- no pain in left hip - Stable at this time  Hypokalemia - replaced - Labs reviewed. Stable.  H/o right IJ thrombus - cont Xarelto as tolerated - No signs of acute blood loss  Iron deficiency anemia - received Iron infusions by Dr Marin Olp  Constipation -Per above, no significant bowel movement over the past 1-2 weeks. No results with lactulose -results with mag citrate with dulcolax suppository -Will prescribe scheduled colace and miralax on discharge in setting of chronic narcotic use  Chronic narcotic use -Patient with fentanyl patch and narotic use prior to admit -Have ordered bowel regimen per above.  Discharge Diagnoses:  Principal Problem:   Elevated LFTs Active Problems:   Benign prostatic hyperplasia with nocturia   Kidney cancer, primary, with metastasis from kidney to other site, right (HCC)   Transaminitis   Iron deficiency anemia due to chronic blood loss   Malnutrition of moderate degree    Discharge Instructions   Allergies as of 08/04/2018      Reactions   Feraheme [ferumoxytol] Anaphylaxis, Other (See Comments)   Flush. Patient denies anaphylaxis.      Medication List    STOP taking these medications   acetaminophen 650 MG CR tablet Commonly known as:  TYLENOL   amoxicillin-clavulanate 875-125 MG tablet Commonly known as:  AUGMENTIN   diclofenac sodium 1 % Gel Commonly known as:  VOLTAREN   lenvatinib  18 mg daily dose 10 MG & 2 x 4 MG capsule Commonly known as:  LENVIMA   lipase/protease/amylase 36000 UNITS Cpep capsule Commonly known as:  CREON   metoCLOPramide 10 MG tablet Commonly known as:  REGLAN   traMADol 50 MG tablet Commonly known as:  ULTRAM     TAKE these medications   ciprofloxacin 500 MG tablet Commonly known as:  CIPRO Take 1 tablet (500 mg total)  by mouth 2 (two) times daily for 4 days.   dexamethasone 0.5 MG/5ML solution Commonly known as:  DECADRON Take 10 mLs (1 mg total) by mouth 4 (four) times daily. Swish in mouth for 2 minutes and spit out. Avoid eating/drinking for 1hr after rinse What changed:    when to take this  reasons to take this   docusate sodium 100 MG capsule Commonly known as:  COLACE Take 1 capsule (100 mg total) by mouth 2 (two) times daily.   everolimus 5 MG tablet Commonly known as:  AFINITOR Take 1 tablet (5 mg total) by mouth daily.   fentaNYL 25 MCG/HR patch Commonly known as:  DURAGESIC - dosed mcg/hr Place 1 patch (25 mcg total) onto the skin every 3 (three) days.   glimepiride 4 MG tablet Commonly known as:  AMARYL Take 1 tablet (4 mg total) by mouth daily with breakfast.   HYDROmorphone 4 MG tablet Commonly known as:  DILAUDID Take 1 tablet (4 mg total) by mouth every 6 (six) hours as needed for moderate pain or severe pain.   metroNIDAZOLE 500 MG tablet Commonly known as:  FLAGYL Take 1 tablet (500 mg total) by mouth 3 (three) times daily for 4 days.   pantoprazole 40 MG tablet Commonly known as:  PROTONIX Take 1 tablet (40 mg total) by mouth daily. Start taking on:  08/05/2018   polyethylene glycol packet Commonly known as:  MIRALAX / GLYCOLAX Take 17 g by mouth daily.   rivaroxaban 10 MG Tabs tablet Commonly known as:  XARELTO Take 1 tablet (10 mg total) by mouth daily with supper.      Follow-up Information    [name], [place]. Schedule an appointment as soon as possible for a visit in 1 week(s).   Specialty:  Physician Assistant Contact information: [address] [place] [address] [phone]        [name], MD Follow up.   Specialty:  Oncology Why:  as scheduled Contact information: [address] [place] [address] [phone]        Carol Ada, MD. Schedule an appointment as soon as  possible for a visit.   Specialty:  Gastroenterology Why:  as needed Contact information: [place], [address] [place] [address] [phone]          Allergies  Allergen Reactions  . Feraheme [Ferumoxytol] Anaphylaxis and Other (See Comments)    Flush. Patient denies anaphylaxis.    Consultations:  Oncology  GI  Procedures/Studies: Ct Abdomen Pelvis W Contrast  Result Date: 08/01/2018 CLINICAL DATA:  57 year old with abdominal distension and acute abdominal pain. History of metastatic renal cell carcinoma. EXAM: CT ABDOMEN AND PELVIS WITH CONTRAST TECHNIQUE: Multidetector CT imaging of the abdomen and pelvis was performed using the standard protocol following bolus administration of intravenous contrast. CONTRAST:  143m ISOVUE-300 IOPAMIDOL (ISOVUE-300) INJECTION 61% COMPARISON:  PET-CT 04/28/2018 FINDINGS: Lower chest: There is a low-density necrotic lymph node in the subcarinal region that measures 2.4 cm in the short  axis. There was disease in this area on the previous PET-CT. Dependent densities in the lower lobes are suggestive for atelectasis. No large pleural effusions. Hepatobiliary: There is new perihepatic ascites. 1.1 cm right hepatic lesion on sequence 2, image 16. This was not clearly present on the prior PET and could represent a new or recurrent hepatic lesion. There is periportal edema. There is edema or fluid around the gallbladder which contains multiple calcified stones. Mild gallbladder distension compared to the previous CT. There is no significant extrahepatic biliary dilatation. Main portal veins are patent. Pancreas: Unremarkable. No pancreatic ductal dilatation or surrounding inflammatory changes. Spleen: New small amount of perisplenic ascites. Otherwise, normal appearance of the spleen. Adrenals/Urinary Tract: Normal appearance of the right adrenal gland. Question mild fullness of the left adrenal gland. Stable appearance of left kidney with a  cortical cyst in the upper pole. Urinary bladder is unremarkable. Again noted is a large heterogeneous mass involving the right kidney lower pole. Mass has not significantly changed in size measuring 9.8 x 8.5 cm on sequence 2, image 42. The mass abuts the C-loop of the duodenum. Stomach/Bowel: There is diffuse wall thickening involving the transverse segment of the duodenum. Wall thickening measures roughly 1.1 cm. No evidence for wall thickening in the proximal duodenum. No gross abnormality to the stomach. Again noted is mesenteric edema in the right central aspect of the abdomen. There is no evidence for a bowel obstruction. Vascular/Lymphatic: Mild atherosclerotic disease in the aorta without aneurysm. Again noted are enlarged periaortic lymph nodes which have not significantly changed. Left periaortic lymph node on sequence 2 image 40 measures 2 cm in the short axis and recent recently measured 1.9 cm on the PET-CT. Retrocrural lymph nodes have decreased in size. Left retrocrural lymph node on series 2, image 23 measures 1.1 cm in short axis and previously measured 2.0 cm. Reproductive: Prostate is unremarkable. Other: Small amount of pelvic ascites is new. Mild mesenteric edema. Small amount of ascites in the upper abdomen around the liver and spleen are new. Musculoskeletal: Again noted are lucent bone lesions involving L1 and L4. Lucent bone lesion involving the left ilium on sequence 2 image 66 measures 1.3 cm and recently measured 0.9 cm. Lucent lesion in the left iliac wing measures 0.9 cm and more conspicuous compared to the recent comparison examination. IMPRESSION: New wall thickening involving the third and fourth portions of the duodenum of uncertain etiology. This could be infectious or inflammatory. The primary renal tumor abuts the duodenum but tumor involvement in this area is thought to be less likely. Metastatic renal cell carcinoma with a mixed response to therapy since the recent PET-CT.  Concern for a new liver lesion and enlarging bone lesions in the left ilium. In addition, there is new ascites. However, the retrocrural lymph nodes have decreased in size. No significant change in the primary right renal lesion. Cholelithiasis with a small amount of pericholecystic edema or fluid. This pericholecystic fluid could be related to the ascites but limited evaluation for cholecystitis. If there is clinical concern for cholecystitis, recommend further characterization with a right upper quadrant ultrasound. Electronically Signed   By: Markus Daft M.D.   On: 08/01/2018 13:58     Subjective: Eager to go home  Discharge Exam: Vitals:   08/03/18 2103 08/04/18 0625  BP: 137/73 127/72  Pulse: 76 70  Resp: 16 16  Temp: 98.3 F (36.8 C) 97.9 F (36.6 C)  SpO2: 93% 95%   Vitals:   08/03/18 0507  08/03/18 1229 08/03/18 2103 08/04/18 0625  BP: 133/77 130/73 137/73 127/72  Pulse: 72 72 76 70  Resp: '16  16 16  ' Temp: 98.1 F (36.7 C) 98.1 F (36.7 C) 98.3 F (36.8 C) 97.9 F (36.6 C)  TempSrc: Oral Oral    SpO2: 94% 94% 93% 95%  Weight:      Height:        General: Pt is alert, awake, not in acute distress Cardiovascular: RRR, S1/S2 +, no rubs, no gallops Respiratory: CTA bilaterally, no wheezing, no rhonchi Abdominal: Soft, NT, ND, bowel sounds + Extremities: no edema, no cyanosis   The results of significant diagnostics from this hospitalization (including imaging, microbiology, ancillary and laboratory) are listed below for reference.     Microbiology: No results found for this or any previous visit (from the past 240 hour(s)).   Labs: BNP (last 3 results) No results for input(s): BNP in the last 8760 hours. Basic Metabolic Panel: Recent Labs  Lab 07/31/18 0916 08/01/18 1150 08/02/18 0535 08/03/18 0429 08/04/18 0436  NA 135 140 139 139 140  K 3.1* 3.2* 4.2 4.5 4.2  CL 98 99 105 105 104  CO2 '27 27 26 25 27  ' GLUCOSE 115 112* 114* 237* 173*  BUN 6* 7 5* 8 9   CREATININE 0.60 0.40* 0.41* 0.42* 0.57*  CALCIUM 9.6 9.3 8.7* 9.2 8.9   Liver Function Tests: Recent Labs  Lab 07/31/18 0916 08/01/18 1150 08/03/18 0429 08/04/18 0436  AST 185* 187* 144* 90*  ALT 151* 183* 164* 143*  ALKPHOS 733* 802* 727* 630*  BILITOT 4.1* 5.8* 4.8* 1.9*  PROT 7.3 7.1 6.0* 5.7*  ALBUMIN 3.2* 3.2* 2.5* 2.4*   Recent Labs  Lab 08/01/18 1150  LIPASE 121*   No results for input(s): AMMONIA in the last 168 hours. CBC: Recent Labs  Lab 07/31/18 0916 08/01/18 1150 08/02/18 0535 08/03/18 0429 08/04/18 0436  WBC 4.8 4.3 5.4 4.3 11.7*  NEUTROABS 3.2  --   --   --  10.6*  HGB 12.3* 15.8 11.9* 11.5* 10.9*  HCT 37.9* 47.4 37.9* 35.9* 34.8*  MCV 74.9* 74.4* 76.3* 75.4* 76.8*  PLT 203 165 217 170 212   Cardiac Enzymes: No results for input(s): CKTOTAL, CKMB, CKMBINDEX, TROPONINI in the last 168 hours. BNP: Invalid input(s): POCBNP CBG: Recent Labs  Lab 08/03/18 1147 08/03/18 1703 08/03/18 2059 08/04/18 0734 08/04/18 1148  GLUCAP 166* 157* 197* 145* 191*   D-Dimer No results for input(s): DDIMER in the last 72 hours. Hgb A1c Recent Labs    08/02/18 0535  HGBA1C 6.1*   Lipid Profile No results for input(s): CHOL, HDL, LDLCALC, TRIG, CHOLHDL, LDLDIRECT in the last 72 hours. Thyroid function studies No results for input(s): TSH, T4TOTAL, T3FREE, THYROIDAB in the last 72 hours.  Invalid input(s): FREET3 Anemia work up No results for input(s): VITAMINB12, FOLATE, FERRITIN, TIBC, IRON, RETICCTPCT in the last 72 hours. Urinalysis    Component Value Date/Time   COLORURINE AMBER (A) 08/01/2018 1107   APPEARANCEUR CLEAR 08/01/2018 1107   LABSPEC 1.021 08/01/2018 1107   PHURINE 5.0 08/01/2018 1107   GLUCOSEU 50 (A) 08/01/2018 1107   HGBUR SMALL (A) 08/01/2018 1107   BILIRUBINUR MODERATE (A) 08/01/2018 1107   KETONESUR 80 (A) 08/01/2018 1107   PROTEINUR 30 (A) 08/01/2018 1107   NITRITE NEGATIVE 08/01/2018 1107   LEUKOCYTESUR SMALL (A)  08/01/2018 1107   Sepsis Labs Invalid input(s): PROCALCITONIN,  WBC,  LACTICIDVEN Microbiology No results found for this or any  previous visit (from the past 240 hour(s)).  Time spent: 80mn  SIGNED:   SMarylu Lund MD  [facility] 08/04/2018, 11:50 AM  If 7PM-7AM, please contact night-coverage

## 2018-08-04 NOTE — Progress Notes (Signed)
Subjective: No complaints.  Feeling well.  Objective: Vital signs in last 24 hours: Temp:  [97.9 F (36.6 C)-98.3 F (36.8 C)] 97.9 F (36.6 C) (08/30 0625) Pulse Rate:  [70-76] 70 (08/30 0625) Resp:  [16] 16 (08/30 0625) BP: (127-137)/(72-73) 127/72 (08/30 0625) SpO2:  [93 %-95 %] 95 % (08/30 0625) Last BM Date: 08/03/18  Intake/Output from previous day: 08/29 0701 - 08/30 0700 In: 1744.6 [P.O.:120; I.V.:613.8; IV Piggyback:1010.8] Out: -  Intake/Output this shift: No intake/output data recorded.  General appearance: alert and no distress GI: soft, non-tender; bowel sounds normal; no masses,  no organomegaly  Lab Results: Recent Labs    08/02/18 0535 08/03/18 0429 08/04/18 0436  WBC 5.4 4.3 11.7*  HGB 11.9* 11.5* 10.9*  HCT 37.9* 35.9* 34.8*  PLT 217 170 212   BMET Recent Labs    08/02/18 0535 08/03/18 0429 08/04/18 0436  NA 139 139 140  K 4.2 4.5 4.2  CL 105 105 104  CO2 26 25 27   GLUCOSE 114* 237* 173*  BUN 5* 8 9  CREATININE 0.41* 0.42* 0.57*  CALCIUM 8.7* 9.2 8.9   LFT Recent Labs    08/04/18 0436  PROT 5.7*  ALBUMIN 2.4*  AST 90*  ALT 143*  ALKPHOS 630*  BILITOT 1.9*   PT/INR No results for input(s): LABPROT, INR in the last 72 hours. Hepatitis Panel No results for input(s): HEPBSAG, HCVAB, HEPAIGM, HEPBIGM in the last 72 hours. C-Diff No results for input(s): CDIFFTOX in the last 72 hours. Fecal Lactopherrin No results for input(s): FECLLACTOFRN in the last 72 hours.  Studies/Results: No results found.  Medications:  Scheduled: . fentaNYL  25 mcg Transdermal Q72H  . insulin aspart  0-15 Units Subcutaneous TID WC  . insulin aspart  0-5 Units Subcutaneous QHS  . pantoprazole  40 mg Oral Daily  . rivaroxaban  10 mg Oral Q supper   Continuous: . 0.9 % NaCl with KCl 40 mEq / L 125 mL/hr at 08/04/18 0600  . ciprofloxacin Stopped (08/03/18 2104)  . metronidazole Stopped (08/04/18 0019)    Assessment/Plan: 1) Duodenal ulcerations  - Possible side effect of Afinitor. 2) Metastatic renal cell carcinoma.   Clinically the patient is well.  No further GI complaints at this time.  At this time it does not appear that he needs any further GI follow up as an outpatient, but he was told to call me if there is any acute change.  Plan: 1) Cipro/Flagyl x 7 days total. 2) PPI QD x 1 month and then he can stop.  LOS: 3 days   Casidee Jann D 08/04/2018, 7:30 AM

## 2018-08-06 ENCOUNTER — Encounter (HOSPITAL_COMMUNITY): Payer: Self-pay | Admitting: Emergency Medicine

## 2018-08-06 ENCOUNTER — Other Ambulatory Visit: Payer: Self-pay

## 2018-08-06 ENCOUNTER — Emergency Department (HOSPITAL_COMMUNITY): Payer: BLUE CROSS/BLUE SHIELD

## 2018-08-06 ENCOUNTER — Emergency Department (HOSPITAL_COMMUNITY)
Admission: EM | Admit: 2018-08-06 | Discharge: 2018-08-06 | Disposition: A | Discharge status: Home / Self Care | Payer: BLUE CROSS/BLUE SHIELD | Attending: Emergency Medicine | Admitting: Emergency Medicine

## 2018-08-06 ENCOUNTER — Inpatient Hospital Stay (HOSPITAL_COMMUNITY)
Admission: EM | Admit: 2018-08-06 | Discharge: 2018-08-28 | Disposition: A | Discharge status: Home / Self Care | Payer: BLUE CROSS/BLUE SHIELD | Source: Home / Self Care | Attending: Internal Medicine | Admitting: Internal Medicine

## 2018-08-06 DIAGNOSIS — E1169 Type 2 diabetes mellitus with other specified complication: Secondary | ICD-10-CM

## 2018-08-06 DIAGNOSIS — K2980 Duodenitis without bleeding: Secondary | ICD-10-CM

## 2018-08-06 DIAGNOSIS — C7951 Secondary malignant neoplasm of bone: Secondary | ICD-10-CM | POA: Insufficient documentation

## 2018-08-06 DIAGNOSIS — R188 Other ascites: Secondary | ICD-10-CM

## 2018-08-06 DIAGNOSIS — Z789 Other specified health status: Secondary | ICD-10-CM

## 2018-08-06 DIAGNOSIS — M545 Low back pain, unspecified: Secondary | ICD-10-CM

## 2018-08-06 DIAGNOSIS — K648 Other hemorrhoids: Secondary | ICD-10-CM

## 2018-08-06 DIAGNOSIS — R109 Unspecified abdominal pain: Secondary | ICD-10-CM | POA: Diagnosis present

## 2018-08-06 DIAGNOSIS — Z79899 Other long term (current) drug therapy: Secondary | ICD-10-CM | POA: Insufficient documentation

## 2018-08-06 DIAGNOSIS — D649 Anemia, unspecified: Secondary | ICD-10-CM

## 2018-08-06 DIAGNOSIS — R740 Nonspecific elevation of levels of transaminase and lactic acid dehydrogenase [LDH]: Secondary | ICD-10-CM

## 2018-08-06 DIAGNOSIS — E876 Hypokalemia: Secondary | ICD-10-CM

## 2018-08-06 DIAGNOSIS — C799 Secondary malignant neoplasm of unspecified site: Secondary | ICD-10-CM

## 2018-08-06 DIAGNOSIS — C7931 Secondary malignant neoplasm of brain: Secondary | ICD-10-CM | POA: Insufficient documentation

## 2018-08-06 DIAGNOSIS — E669 Obesity, unspecified: Secondary | ICD-10-CM

## 2018-08-06 DIAGNOSIS — Z7984 Long term (current) use of oral hypoglycemic drugs: Secondary | ICD-10-CM | POA: Insufficient documentation

## 2018-08-06 DIAGNOSIS — R112 Nausea with vomiting, unspecified: Secondary | ICD-10-CM | POA: Diagnosis present

## 2018-08-06 DIAGNOSIS — I82C21 Chronic embolism and thrombosis of right internal jugular vein: Secondary | ICD-10-CM

## 2018-08-06 DIAGNOSIS — Z85528 Personal history of other malignant neoplasm of kidney: Secondary | ICD-10-CM | POA: Insufficient documentation

## 2018-08-06 DIAGNOSIS — K5900 Constipation, unspecified: Secondary | ICD-10-CM

## 2018-08-06 DIAGNOSIS — R52 Pain, unspecified: Secondary | ICD-10-CM

## 2018-08-06 DIAGNOSIS — R7401 Elevation of levels of liver transaminase levels: Secondary | ICD-10-CM | POA: Diagnosis present

## 2018-08-06 DIAGNOSIS — K8500 Idiopathic acute pancreatitis without necrosis or infection: Secondary | ICD-10-CM

## 2018-08-06 DIAGNOSIS — C641 Malignant neoplasm of right kidney, except renal pelvis: Secondary | ICD-10-CM

## 2018-08-06 DIAGNOSIS — C649 Malignant neoplasm of unspecified kidney, except renal pelvis: Secondary | ICD-10-CM

## 2018-08-06 DIAGNOSIS — K56609 Unspecified intestinal obstruction, unspecified as to partial versus complete obstruction: Secondary | ICD-10-CM

## 2018-08-06 DIAGNOSIS — K859 Acute pancreatitis without necrosis or infection, unspecified: Secondary | ICD-10-CM

## 2018-08-06 DIAGNOSIS — G8929 Other chronic pain: Secondary | ICD-10-CM

## 2018-08-06 HISTORY — DX: Duodenitis without bleeding: K29.80

## 2018-08-06 LAB — CBC WITH DIFFERENTIAL/PLATELET
BASOS ABS: 0 10*3/uL (ref 0.0–0.1)
Basophils Relative: 0 %
EOS PCT: 0 %
Eosinophils Absolute: 0 10*3/uL (ref 0.0–0.7)
HEMATOCRIT: 38.4 % — AB (ref 39.0–52.0)
Hemoglobin: 12.4 g/dL — ABNORMAL LOW (ref 13.0–17.0)
LYMPHS ABS: 0.7 10*3/uL (ref 0.7–4.0)
Lymphocytes Relative: 11 %
MCH: 24.5 pg — AB (ref 26.0–34.0)
MCHC: 32.3 g/dL (ref 30.0–36.0)
MCV: 75.7 fL — ABNORMAL LOW (ref 78.0–100.0)
MONO ABS: 0.6 10*3/uL (ref 0.1–1.0)
Monocytes Relative: 8 %
NEUTROS ABS: 5.2 10*3/uL (ref 1.7–7.7)
Neutrophils Relative %: 81 %
Platelets: 248 10*3/uL (ref 150–400)
RBC: 5.07 MIL/uL (ref 4.22–5.81)
RDW: 17.8 % — AB (ref 11.5–15.5)
WBC: 6.5 10*3/uL (ref 4.0–10.5)

## 2018-08-06 MED ORDER — MORPHINE SULFATE (PF) 4 MG/ML IV SOLN
4.0000 mg | Freq: Once | INTRAVENOUS | Status: AC
  Administered 2018-08-06: 4 mg via INTRAVENOUS
  Filled 2018-08-06: qty 1

## 2018-08-06 MED ORDER — ONDANSETRON HCL 4 MG/2ML IJ SOLN
4.0000 mg | Freq: Once | INTRAMUSCULAR | Status: AC
  Administered 2018-08-06: 4 mg via INTRAVENOUS
  Filled 2018-08-06: qty 2

## 2018-08-06 MED ORDER — SODIUM CHLORIDE 0.9 % IV BOLUS
1000.0000 mL | Freq: Once | INTRAVENOUS | Status: AC
  Administered 2018-08-06: 1000 mL via INTRAVENOUS

## 2018-08-06 MED ORDER — MILK AND MOLASSES ENEMA
1.0000 | Freq: Once | RECTAL | Status: AC
  Administered 2018-08-06: 250 mL via RECTAL
  Filled 2018-08-06: qty 250

## 2018-08-06 MED ORDER — IOHEXOL 300 MG/ML  SOLN
30.0000 mL | Freq: Once | INTRAMUSCULAR | Status: AC | PRN
  Administered 2018-08-07: 30 mL via ORAL

## 2018-08-06 NOTE — ED Notes (Signed)
Pt states "took off his fentanyl patch and not taking any of his other pain medicine" because of his constipation but now is in pain and asking for something for pain. Pt states that he told his oncologist about his condition and he told him to come straight back to the ED

## 2018-08-06 NOTE — ED Notes (Signed)
Pt has bedside commode placed at bedside. Pt was able to hold Milk and Molasses Enema for about 3-4 minutes before needing to use bedside commode.

## 2018-08-06 NOTE — ED Notes (Signed)
Pt was standing outside room ready to go due to smell. Pt did not sign E-Signature.

## 2018-08-06 NOTE — ED Triage Notes (Signed)
Patient c/o generalized abdominal pain, bloating, and constipation, seen today for same. Reports one episode of emesis after discharge.

## 2018-08-06 NOTE — ED Provider Notes (Signed)
[place] DEPT Provider Note   CSN: [id number] Arrival date & time: 08/06/18  1934     History   Chief Complaint Chief Complaint  Patient presents with  . Abdominal Pain  . Constipation    HPI Alexander Fry is a 57 y.o. male.  Patient is a 57 year old male with past medical history of renal cell carcinoma currently treated with oral chemotherapy.  He presents today for evaluation of abdominal pain.  This is been ongoing for the past 2 weeks.  He was admitted last week with similar issues.  He underwent CT scan, MRI, and endoscopy.  These showed duodenitis, likely related to radiation therapy.  He states that he has not had a bowel movement in several days.  He denies having passed any gas.  He denies any vomiting.  He states he has been unable to eat secondary to pain.  He was seen this morning at which time x-rays showed increased stool.  He was given an enema and had a bowel movement, then was discharged.  He states his pain is not gone away.  The history is provided by the patient.  Abdominal Pain   This is a new problem. Episode onset: 2 weeks ago. The problem occurs constantly. The problem has been gradually worsening. The pain is associated with an unknown factor. The pain is located in the generalized abdominal region. The quality of the pain is cramping. The pain is moderate. Pertinent negatives include fever, melena, nausea, vomiting and hematuria. The symptoms are aggravated by certain positions and palpation. Nothing relieves the symptoms.    Past Medical History:  Diagnosis Date  . Bilateral primary osteoarthritis of knee 04/03/2018  . Goals of care, counseling/discussion 12/22/2016  . History of radiation therapy 02/11/2017   SRT right posterior frontal 12 mm target 20 Gy, Right anterior frontal 25mm 20 Gy  . History of radiation therapy 02/21/2017   SRT Right post parietal 59mm target 20 Gy, left Post parietal 77mm 20 Gy, Right Occipital 4 mm  20Gy, Left Occipital 4 mm 20 Gy  . History of radiation therapy 02/25/2017, 02/28/2017, 03/02/2017   SRT L1 spine 27 Gy 3 fractions, L4 Spine 27 Gy 3 fractions  . History of radiation therapy 06/07/2017   SRS- Brain  . History of radiation therapy 04/03/2018   Right ilium, 8 Gy in 1 fraction for a total dose of 8 Gy  . Inguinal hernia of left side without obstruction or gangrene   . Iron deficiency anemia due to chronic blood loss 05/23/2018  . Pneumonia    left lung  . Renal cell carcinoma, right ([place]) 01/05/2017  . 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 His vision has "spots" at times    Patient Active Problem List   Diagnosis Date Noted  . Malnutrition of moderate degree 08/04/2018  . Elevated LFTs 08/01/2018  . Iron deficiency anemia due to chronic blood loss 05/23/2018  . Hematuria, gross 05/03/2018  . Sleep disturbance 04/06/2018  . Knee arthropathy 04/03/2018  . Bilateral primary osteoarthritis of knee 04/03/2018  . Drug-induced jaundice 04/21/2017  . Rash and nonspecific skin eruption 04/21/2017  . Transaminitis 02/20/2017  . Onychomycosis of left great toe 02/17/2017  . Corn of foot 02/17/2017  . Brain metastases ([place]) 01/30/2017  . Bone metastases ([place]) 01/30/2017  . Internal hemorrhoids 01/07/2017  . Kidney cancer, primary, with metastasis from kidney to other site, right ([place]) 01/05/2017  . Acute right-sided low back  pain with right-sided sciatica 12/24/2016  . Goals of care, counseling/discussion 12/22/2016  . Benign prostatic hyperplasia with nocturia 12/10/2016  . Abnormal CT of the chest 12/10/2016  . Hilar adenopathy 12/10/2016  . Multiple pulmonary nodules determined by computed tomography of lung 12/10/2016    Past Surgical History:  Procedure Laterality Date  . BIOPSY  08/02/2018   Procedure: BIOPSY;  Surgeon: Juanita Craver, MD;  Location: [facility] ENDOSCOPY;  Service: Endoscopy;;  . ESOPHAGOGASTRODUODENOSCOPY N/A 08/02/2018   Procedure:  ESOPHAGOGASTRODUODENOSCOPY (EGD);  Surgeon: Juanita Craver, MD;  Location: [facility];  Service: Endoscopy;  Laterality: N/A;  . HERNIA REPAIR    . INGUINAL HERNIA REPAIR Left   . IR FLUORO GUIDE PORT INSERTION RIGHT  02/06/2018  . IR US GUIDE VASC ACCESS RIGHT  02/06/2018        Home Medications    Prior to Admission medications   Medication Sig Start Date End Date Taking? Authorizing Provider  ciprofloxacin (CIPRO) 500 MG tablet Take 1 tablet (500 mg total) by mouth 2 (two) times daily for 4 days. 08/04/18 08/08/18  Donne Hazel, MD  dexamethasone (DECADRON) 0.5 MG/5ML solution Take 10 mLs (1 mg total) by mouth 4 (four) times daily. Swish in mouth for 2 minutes and spit out. Avoid eating/drinking for 1hr after rinse Patient not taking: Reported on 08/06/2018 06/02/18   [name], MD  docusate sodium (COLACE) 100 MG capsule Take 1 capsule (100 mg total) by mouth 2 (two) times daily. 08/04/18 09/03/18  Donne Hazel, MD  everolimus (AFINITOR) 5 MG tablet Take 1 tablet (5 mg total) by mouth daily. Patient not taking: Reported on 08/06/2018 06/02/18   [name], MD  fentaNYL (DURAGESIC - DOSED MCG/HR) 25 MCG/HR patch Place 1 patch (25 mcg total) onto the skin every 3 (three) days. 07/27/18   [place], Holli Humbles, NP  glimepiride (AMARYL) 4 MG tablet Take 1 tablet (4 mg total) by mouth daily with breakfast. 07/20/18   [name], MD  HYDROmorphone (DILAUDID) 4 MG tablet Take 1 tablet (4 mg total) by mouth every 6 (six) hours as needed for moderate pain or severe pain. 07/27/18   [place], Holli Humbles, NP  metoCLOPramide (REGLAN) 10 MG tablet Take 10 mg by mouth 4 (four) times daily.    [provider]  metroNIDAZOLE (FLAGYL) 500 MG tablet Take 1 tablet (500 mg total) by mouth 3 (three) times daily for 4 days. 08/04/18 08/08/18  Donne Hazel, MD  pantoprazole (PROTONIX) 40 MG tablet Take 1 tablet (40 mg total) by mouth daily. 08/05/18 09/04/18  Donne Hazel, MD  polyethylene  glycol [facility]) packet Take 17 g by mouth daily. 08/04/18 09/03/18  Donne Hazel, MD  rivaroxaban (XARELTO) 10 MG TABS tablet Take 1 tablet (10 mg total) by mouth daily with supper. Patient taking differently: Take 20 mg by mouth daily with supper.  06/21/18   [place], Holli Humbles, NP    Family History Family History  Problem Relation Age of Onset  . Heart disease Mother   . Diabetes Mother   . Heart disease Father   . Alcohol abuse Brother     Social History Social History   Tobacco Use  . Smoking status: Never Smoker  . Smokeless tobacco: Never Used  Substance Use Topics  . Alcohol use: Yes    Comment: social use in the past  . Drug use: No     Allergies   Feraheme [ferumoxytol]   Review of Systems Review  of Systems  Constitutional: Negative for fever.  Gastrointestinal: Negative for melena, nausea and vomiting.  Genitourinary: Negative for hematuria.  All other systems reviewed and are negative.    Physical Exam Updated Vital Signs BP (!) 144/87 (BP Location: Right Arm)   Pulse 94   Temp 98.4 F (36.9 C)   Resp 18   Ht 6' (1.829 m)   Wt 97.5 kg   SpO2 97%   BMI 29.16 kg/m   Physical Exam  Constitutional: He is oriented to person, place, and time. He appears well-developed and well-nourished. No distress.  HENT:  Head: Normocephalic and atraumatic.  Mouth/Throat: Oropharynx is clear and moist.  Neck: Normal range of motion. Neck supple.  Cardiovascular: Normal rate and regular rhythm. Exam reveals no friction rub.  No murmur heard. Pulmonary/Chest: Effort normal and breath sounds normal. No respiratory distress. He has no wheezes. He has no rales.  Abdominal: Soft. Bowel sounds are normal. He exhibits no distension. There is generalized tenderness. There is no rigidity, no rebound and no guarding.  Musculoskeletal: Normal range of motion. He exhibits no edema.  Neurological: He is alert and oriented to person, place, and time. Coordination normal.    Skin: Skin is warm and dry. He is not diaphoretic.  Nursing note and vitals reviewed.    ED Treatments / Results  Labs (all labs ordered are listed, but only abnormal results are displayed) Labs Reviewed  COMPREHENSIVE METABOLIC PANEL  LIPASE, BLOOD  CBC WITH DIFFERENTIAL/PLATELET    EKG None  Radiology Dg Abd Acute W/chest  Result Date: 08/06/2018 CLINICAL DATA:  Abdominal pain EXAM: DG ABDOMEN ACUTE W/ 1V CHEST COMPARISON:  None. FINDINGS: Port in the anterior chest wall with tip in distal SVC. Normal mediastinum and cardiac silhouette. Normal pulmonary vasculature. No evidence of effusion, infiltrate, or pneumothorax. No acute bony abnormality. Moderate volume stool in the ascending colon. Gallstones noted. No dilated large or small bowel. Large RIGHT renal mass in the RIGHT lower quadrant not well appreciated. IMPRESSION: 1. No acute findings in the chest or abdomen. 2. Moderate volume stool in the ascending colon. 3. Large 10 cm RIGHT renal mass demonstrated on CT 08/01/2018 not appreciated. Electronically Signed   By: Suzy Bouchard M.D.   On: 08/06/2018 08:26    Procedures Procedures (including critical care time)  Medications Ordered in ED Medications  sodium chloride 0.9 % bolus 1,000 mL (has no administration in time range)  morphine 4 MG/ML injection 4 mg (has no administration in time range)  ondansetron (ZOFRAN) injection 4 mg (has no administration in time range)     Initial Impression / Assessment and Plan / ED Course  I have reviewed the triage vital signs and the nursing notes.  Pertinent labs & imaging results that were available during my care of the patient were reviewed by me and considered in my medical decision making (see chart for details).  Patient presenting with abdominal pain that has been ongoing for the past week and a half.  He had a previous hospitalization showing duodenitis, however no other findings.  He returns today with ongoing pain.   He did not improve with an enema earlier this morning and returns.  His work-up this afternoon shows no acute finding on his CT scan, however the lipase is nearly 1000.  It appears as though he has pancreatitis as the cause of his pain and will be admitted for pain control and hydration.  Dr. Maudie Mercury agrees to admit.  Final Clinical Impressions(s) /  ED Diagnoses   Final diagnoses:  None    ED Discharge Orders    None       Veryl Speak, MD 08/07/18 [phone]

## 2018-08-06 NOTE — ED Notes (Signed)
Pt getting dressed and ready for discharge.

## 2018-08-06 NOTE — ED Triage Notes (Signed)
Pt c/o bilateral lower abdominal pain, denies N/V but c/o constipation, states last bm was 08/04/2018. Pt states he placed a new fentanyl patch this morning. Pt states pain 10/10.

## 2018-08-06 NOTE — ED Notes (Signed)
Pt has completed his bowel movement and is cleaned and sitting on bed ready for re-eval from ED Provider.

## 2018-08-06 NOTE — ED Provider Notes (Signed)
[place] DEPT Provider Note   CSN: [id number] Arrival date & time: 08/06/18  0702     History   Chief Complaint Chief Complaint  Patient presents with  . Abdominal Pain  . Constipation    HPI Alexander Fry is a 57 y.o. male.  57 year old male with history of renal cell cancer presents with worsening constipation.  Was discharged in the hospital 2 days ago after an admission for similar symptoms.  States that he has not moved his bowels in several days.  Has been passing gas but no stool.  Does chronically use a fentanyl patch.  He has not had any fever or chills.  No vomiting noted.  Abdominal pain is diffuse and crampy.  He has been using oral medications for constipation without relief.     Past Medical History:  Diagnosis Date  . Bilateral primary osteoarthritis of knee 04/03/2018  . Goals of care, counseling/discussion 12/22/2016  . History of radiation therapy 02/11/2017   SRT right posterior frontal 12 mm target 20 Gy, Right anterior frontal 31mm 20 Gy  . History of radiation therapy 02/21/2017   SRT Right post parietal 23mm target 20 Gy, left Post parietal 74mm 20 Gy, Right Occipital 4 mm 20Gy, Left Occipital 4 mm 20 Gy  . History of radiation therapy 02/25/2017, 02/28/2017, 03/02/2017   SRT L1 spine 27 Gy 3 fractions, L4 Spine 27 Gy 3 fractions  . History of radiation therapy 06/07/2017   SRS- Brain  . History of radiation therapy 04/03/2018   Right ilium, 8 Gy in 1 fraction for a total dose of 8 Gy  . Inguinal hernia of left side without obstruction or gangrene   . Iron deficiency anemia due to chronic blood loss 05/23/2018  . Pneumonia    left lung  . Renal cell carcinoma, right ([place]) 01/05/2017  . 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 His vision has "spots" at times    Patient Active Problem List   Diagnosis Date Noted  . Malnutrition of moderate degree 08/04/2018  . Elevated LFTs 08/01/2018  . Iron deficiency  anemia due to chronic blood loss 05/23/2018  . Hematuria, gross 05/03/2018  . Sleep disturbance 04/06/2018  . Knee arthropathy 04/03/2018  . Bilateral primary osteoarthritis of knee 04/03/2018  . Drug-induced jaundice 04/21/2017  . Rash and nonspecific skin eruption 04/21/2017  . Transaminitis 02/20/2017  . Onychomycosis of left great toe 02/17/2017  . Corn of foot 02/17/2017  . Brain metastases ([place]) 01/30/2017  . Bone metastases ([place]) 01/30/2017  . Internal hemorrhoids 01/07/2017  . Kidney cancer, primary, with metastasis from kidney to other site, right ([place]) 01/05/2017  . Acute right-sided low back pain with right-sided sciatica 12/24/2016  . Goals of care, counseling/discussion 12/22/2016  . Benign prostatic hyperplasia with nocturia 12/10/2016  . Abnormal CT of the chest 12/10/2016  . Hilar adenopathy 12/10/2016  . Multiple pulmonary nodules determined by computed tomography of lung 12/10/2016    Past Surgical History:  Procedure Laterality Date  . BIOPSY  08/02/2018   Procedure: BIOPSY;  Surgeon: Juanita Craver, MD;  Location: [facility]OSCOPY;  Service: Endoscopy;;  . ESOPHAGOGASTRODUODENOSCOPY N/A 08/02/2018   Procedure: ESOPHAGOGASTRODUODENOSCOPY (EGD);  Surgeon: Juanita Craver, MD;  Location: [facility];  Service: Endoscopy;  Laterality: N/A;  . HERNIA REPAIR    . INGUINAL HERNIA REPAIR Left   . IR FLUORO GUIDE PORT INSERTION RIGHT  02/06/2018  . IR US GUIDE VASC ACCESS RIGHT  02/06/2018  Home Medications    Prior to Admission medications   Medication Sig Start Date End Date Taking? Authorizing Provider  ciprofloxacin (CIPRO) 500 MG tablet Take 1 tablet (500 mg total) by mouth 2 (two) times daily for 4 days. 08/04/18 08/08/18  Donne Hazel, MD  dexamethasone (DECADRON) 0.5 MG/5ML solution Take 10 mLs (1 mg total) by mouth 4 (four) times daily. Swish in mouth for 2 minutes and spit out. Avoid eating/drinking for 1hr after rinse Patient taking differently: Take 1 mg by  mouth 4 (four) times daily as needed (for mouth sores). Swish in mouth for 2 minutes and spit out. Avoid eating/drinking for 1hr after rinse 06/02/18   [place], Rudell Cobb, MD  docusate sodium (COLACE) 100 MG capsule Take 1 capsule (100 mg total) by mouth 2 (two) times daily. 08/04/18 09/03/18  Donne Hazel, MD  everolimus (AFINITOR) 5 MG tablet Take 1 tablet (5 mg total) by mouth daily. 06/02/18   [name], MD  fentaNYL (DURAGESIC - DOSED MCG/HR) 25 MCG/HR patch Place 1 patch (25 mcg total) onto the skin every 3 (three) days. 07/27/18   [place], Holli Humbles, NP  glimepiride (AMARYL) 4 MG tablet Take 1 tablet (4 mg total) by mouth daily with breakfast. 07/20/18   [name], MD  HYDROmorphone (DILAUDID) 4 MG tablet Take 1 tablet (4 mg total) by mouth every 6 (six) hours as needed for moderate pain or severe pain. 07/27/18   [place], Holli Humbles, NP  metroNIDAZOLE (FLAGYL) 500 MG tablet Take 1 tablet (500 mg total) by mouth 3 (three) times daily for 4 days. 08/04/18 08/08/18  Donne Hazel, MD  pantoprazole (PROTONIX) 40 MG tablet Take 1 tablet (40 mg total) by mouth daily. 08/05/18 09/04/18  Donne Hazel, MD  polyethylene glycol [facility]) packet Take 17 g by mouth daily. 08/04/18 09/03/18  Donne Hazel, MD  rivaroxaban (XARELTO) 10 MG TABS tablet Take 1 tablet (10 mg total) by mouth daily with supper. 06/21/18   [place], Holli Humbles, NP    Family History Family History  Problem Relation Age of Onset  . Heart disease Mother   . Diabetes Mother   . Heart disease Father   . Alcohol abuse Brother     Social History Social History   Tobacco Use  . Smoking status: Never Smoker  . Smokeless tobacco: Never Used  Substance Use Topics  . Alcohol use: Yes    Comment: social use in the past  . Drug use: No     Allergies   Feraheme [ferumoxytol]   Review of Systems Review of Systems  All other systems reviewed and are negative.    Physical Exam Updated Vital Signs BP (!) 137/99    Pulse 93   Temp (!) 97.3 F (36.3 C)   Resp 16   SpO2 100%   Physical Exam  Constitutional: He is oriented to person, place, and time. He appears well-developed and well-nourished.  Non-toxic appearance. No distress.  HENT:  Head: Normocephalic and atraumatic.  Eyes: Pupils are equal, round, and reactive to light. Conjunctivae, EOM and lids are normal.  Neck: Normal range of motion. Neck supple. No tracheal deviation present. No thyroid mass present.  Cardiovascular: Normal rate, regular rhythm and normal heart sounds. Exam reveals no gallop.  No murmur heard. Pulmonary/Chest: Effort normal and breath sounds normal. No stridor. No respiratory distress. He has no decreased breath sounds. He has no wheezes. He has no rhonchi. He has no rales.  Abdominal:  Soft. Normal appearance and bowel sounds are normal. He exhibits no distension. There is no tenderness. There is no rigidity, no rebound, no guarding and no CVA tenderness.  Musculoskeletal: Normal range of motion. He exhibits no edema or tenderness.  Neurological: He is alert and oriented to person, place, and time. He has normal strength. No cranial nerve deficit or sensory deficit. GCS eye subscore is 4. GCS verbal subscore is 5. GCS motor subscore is 6.  Skin: Skin is warm and dry. No abrasion and no rash noted.  Psychiatric: He has a normal mood and affect. His speech is normal and behavior is normal.  Nursing note and vitals reviewed.    ED Treatments / Results  Labs (all labs ordered are listed, but only abnormal results are displayed) Labs Reviewed - No data to display  EKG None  Radiology No results found.  Procedures Procedures (including critical care time)  Medications Ordered in ED Medications - No data to display   Initial Impression / Assessment and Plan / ED Course  I have reviewed the triage vital signs and the nursing notes.  Pertinent labs & imaging results that were available during my care of the  patient were reviewed by me and considered in my medical decision making (see chart for details).     Patient had acute abdominal series which showed copious amounts of stool.  Given fleets enema by nursing and has had a good result.   Stable for discharge  Final Clinical Impressions(s) / ED Diagnoses   Final diagnoses:  None    ED Discharge Orders    None       Lacretia Leigh, MD 08/06/18 1057

## 2018-08-06 NOTE — ED Notes (Signed)
ED Provider at bedside. 

## 2018-08-07 ENCOUNTER — Encounter (HOSPITAL_COMMUNITY): Payer: Self-pay | Admitting: Internal Medicine

## 2018-08-07 ENCOUNTER — Emergency Department (HOSPITAL_COMMUNITY): Payer: BLUE CROSS/BLUE SHIELD

## 2018-08-07 ENCOUNTER — Other Ambulatory Visit: Payer: Self-pay

## 2018-08-07 DIAGNOSIS — E876 Hypokalemia: Secondary | ICD-10-CM

## 2018-08-07 DIAGNOSIS — R109 Unspecified abdominal pain: Secondary | ICD-10-CM

## 2018-08-07 DIAGNOSIS — R112 Nausea with vomiting, unspecified: Secondary | ICD-10-CM

## 2018-08-07 DIAGNOSIS — K2980 Duodenitis without bleeding: Secondary | ICD-10-CM

## 2018-08-07 DIAGNOSIS — K859 Acute pancreatitis without necrosis or infection, unspecified: Secondary | ICD-10-CM | POA: Diagnosis present

## 2018-08-07 DIAGNOSIS — I82C21 Chronic embolism and thrombosis of right internal jugular vein: Secondary | ICD-10-CM

## 2018-08-07 DIAGNOSIS — E669 Obesity, unspecified: Secondary | ICD-10-CM

## 2018-08-07 DIAGNOSIS — D649 Anemia, unspecified: Secondary | ICD-10-CM

## 2018-08-07 DIAGNOSIS — R740 Nonspecific elevation of levels of transaminase and lactic acid dehydrogenase [LDH]: Secondary | ICD-10-CM

## 2018-08-07 DIAGNOSIS — E1169 Type 2 diabetes mellitus with other specified complication: Secondary | ICD-10-CM

## 2018-08-07 LAB — CBC
HCT: 37.4 % — ABNORMAL LOW (ref 39.0–52.0)
Hemoglobin: 12 g/dL — ABNORMAL LOW (ref 13.0–17.0)
MCH: 24.5 pg — AB (ref 26.0–34.0)
MCHC: 32.1 g/dL (ref 30.0–36.0)
MCV: 76.3 fL — AB (ref 78.0–100.0)
PLATELETS: 224 10*3/uL (ref 150–400)
RBC: 4.9 MIL/uL (ref 4.22–5.81)
RDW: 17.9 % — ABNORMAL HIGH (ref 11.5–15.5)
WBC: 6 10*3/uL (ref 4.0–10.5)

## 2018-08-07 LAB — COMPREHENSIVE METABOLIC PANEL
ALBUMIN: 2.7 g/dL — AB (ref 3.5–5.0)
ALK PHOS: 749 U/L — AB (ref 38–126)
ALK PHOS: 875 U/L — AB (ref 38–126)
ALT: 177 U/L — AB (ref 0–44)
ALT: 204 U/L — AB (ref 0–44)
AST: 161 U/L — AB (ref 15–41)
AST: 182 U/L — ABNORMAL HIGH (ref 15–41)
Albumin: 3.2 g/dL — ABNORMAL LOW (ref 3.5–5.0)
Anion gap: 12 (ref 5–15)
Anion gap: 8 (ref 5–15)
BUN: 5 mg/dL — ABNORMAL LOW (ref 6–20)
BUN: 7 mg/dL (ref 6–20)
CALCIUM: 8.6 mg/dL — AB (ref 8.9–10.3)
CALCIUM: 9.3 mg/dL (ref 8.9–10.3)
CHLORIDE: 102 mmol/L (ref 98–111)
CO2: 29 mmol/L (ref 22–32)
CO2: 26 mmol/L (ref 22–32)
CREATININE: 0.42 mg/dL — AB (ref 0.61–1.24)
CREATININE: 0.4 mg/dL — AB (ref 0.61–1.24)
Chloride: 101 mmol/L (ref 98–111)
GFR calc Af Amer: >60 mL/min (ref >60)
GFR calc non Af Amer: >60 mL/min (ref >60)
GFR calc non Af Amer: >60 mL/min (ref >60)
GLUCOSE: 118 mg/dL — AB (ref 70–99)
GLUCOSE: 105 mg/dL — AB (ref 70–99)
Potassium: 3.2 mmol/L — ABNORMAL LOW (ref 3.5–5.1)
Potassium: 3.7 mmol/L (ref 3.5–5.1)
SODIUM: 139 mmol/L (ref 135–145)
SODIUM: 139 mmol/L (ref 135–145)
Total Bilirubin: 6.1 mg/dL — ABNORMAL HIGH (ref 0.3–1.2)
Total Bilirubin: 6.8 mg/dL — ABNORMAL HIGH (ref 0.3–1.2)
Total Protein: 5.8 g/dL — ABNORMAL LOW (ref 6.5–8.1)
Total Protein: 6.7 g/dL (ref 6.5–8.1)

## 2018-08-07 LAB — GLUCOSE, CAPILLARY
GLUCOSE-CAPILLARY: 102 mg/dL — AB (ref 70–99)
GLUCOSE-CAPILLARY: 109 mg/dL — AB (ref 70–99)
GLUCOSE-CAPILLARY: 73 mg/dL (ref 70–99)
GLUCOSE-CAPILLARY: 95 mg/dL (ref 70–99)
Glucose-Capillary: 83 mg/dL (ref 70–99)
Glucose-Capillary: 84 mg/dL (ref 70–99)
Glucose-Capillary: 57 mg/dL — ABNORMAL LOW (ref 70–99)

## 2018-08-07 LAB — LIPID PANEL
CHOL/HDL RATIO: 3.9 ratio
Cholesterol: 222 mg/dL — ABNORMAL HIGH (ref 0–200)
HDL: 57 mg/dL (ref >40)
LDL CALC: 150 mg/dL — AB (ref 0–99)
Triglycerides: 73 mg/dL (ref <150)
VLDL: 15 mg/dL (ref 0–40)

## 2018-08-07 LAB — LIPASE, BLOOD
LIPASE: 572 U/L — AB (ref 11–51)
Lipase: 969 U/L — ABNORMAL HIGH (ref 11–51)

## 2018-08-07 LAB — LACTATE DEHYDROGENASE: LDH: 207 U/L — AB (ref 98–192)

## 2018-08-07 MED ORDER — MORPHINE SULFATE (PF) 4 MG/ML IV SOLN
INTRAVENOUS | Status: AC
  Filled 2018-08-07: qty 1

## 2018-08-07 MED ORDER — SODIUM CHLORIDE 0.9 % IV SOLN
INTRAVENOUS | Status: DC
  Administered 2018-08-07 (×2): via INTRAVENOUS

## 2018-08-07 MED ORDER — IOPAMIDOL (ISOVUE-300) INJECTION 61%
INTRAVENOUS | Status: AC
  Administered 2018-08-07: 01:00:00
  Filled 2018-08-07: qty 100

## 2018-08-07 MED ORDER — PANTOPRAZOLE SODIUM 40 MG PO TBEC
40.0000 mg | DELAYED_RELEASE_TABLET | Freq: Two times a day (BID) | ORAL | Status: DC
  Administered 2018-08-07 – 2018-08-28 (×44): 40 mg via ORAL
  Filled 2018-08-07 (×44): qty 1

## 2018-08-07 MED ORDER — POTASSIUM CHLORIDE 10 MEQ/100ML IV SOLN
10.0000 meq | INTRAVENOUS | Status: AC
  Administered 2018-08-07 (×3): 10 meq via INTRAVENOUS
  Filled 2018-08-07 (×3): qty 100

## 2018-08-07 MED ORDER — ENOXAPARIN SODIUM 40 MG/0.4ML ~~LOC~~ SOLN: 40.0000 mg | SUBCUTANEOUS | Status: DC

## 2018-08-07 MED ORDER — HYDROMORPHONE HCL 1 MG/ML IJ SOLN: 0.5000 mg | INTRAMUSCULAR | Status: DC | PRN

## 2018-08-07 MED ORDER — INSULIN ASPART 100 UNIT/ML ~~LOC~~ SOLN
0.0000 [IU] | SUBCUTANEOUS | Status: DC
  Administered 2018-08-08 – 2018-08-12 (×10): 1 [IU] via SUBCUTANEOUS
  Administered 2018-08-12: 2 [IU] via SUBCUTANEOUS
  Administered 2018-08-13 – 2018-08-14 (×5): 1 [IU] via SUBCUTANEOUS
  Administered 2018-08-15 – 2018-08-16 (×5): 2 [IU] via SUBCUTANEOUS
  Administered 2018-08-16 (×2): 1 [IU] via SUBCUTANEOUS
  Administered 2018-08-16: 2 [IU] via SUBCUTANEOUS
  Administered 2018-08-17 – 2018-08-18 (×7): 1 [IU] via SUBCUTANEOUS
  Administered 2018-08-19: 2 [IU] via SUBCUTANEOUS
  Administered 2018-08-20 – 2018-08-28 (×15): 1 [IU] via SUBCUTANEOUS

## 2018-08-07 MED ORDER — HYDROMORPHONE HCL 1 MG/ML IJ SOLN
1.0000 mg | Freq: Once | INTRAMUSCULAR | Status: AC
  Administered 2018-08-07: 1 mg via INTRAVENOUS
  Filled 2018-08-07: qty 1

## 2018-08-07 MED ORDER — HYDROMORPHONE HCL 1 MG/ML IJ SOLN
1.0000 mg | INTRAMUSCULAR | Status: DC | PRN
  Administered 2018-08-07 (×2): 1 mg via INTRAVENOUS
  Filled 2018-08-07 (×2): qty 1

## 2018-08-07 MED ORDER — MORPHINE SULFATE (PF) 4 MG/ML IV SOLN
4.0000 mg | Freq: Once | INTRAVENOUS | Status: AC
  Administered 2018-08-07: 4 mg via INTRAVENOUS

## 2018-08-07 MED ORDER — METOCLOPRAMIDE HCL 10 MG PO TABS
10.0000 mg | ORAL_TABLET | Freq: Four times a day (QID) | ORAL | Status: DC
  Administered 2018-08-07 – 2018-08-17 (×43): 10 mg via ORAL
  Filled 2018-08-07 (×43): qty 1

## 2018-08-07 MED ORDER — CIPROFLOXACIN IN D5W 400 MG/200ML IV SOLN
400.0000 mg | Freq: Two times a day (BID) | INTRAVENOUS | Status: DC
  Administered 2018-08-07 – 2018-08-09 (×4): 400 mg via INTRAVENOUS
  Filled 2018-08-07 (×4): qty 200

## 2018-08-07 MED ORDER — DOCUSATE SODIUM 100 MG PO CAPS
100.0000 mg | ORAL_CAPSULE | Freq: Two times a day (BID) | ORAL | Status: DC
  Administered 2018-08-07 – 2018-08-28 (×44): 100 mg via ORAL
  Filled 2018-08-07 (×44): qty 1

## 2018-08-07 MED ORDER — INSULIN ASPART 100 UNIT/ML ~~LOC~~ SOLN: 0.0000 [IU] | Freq: Every day | SUBCUTANEOUS | Status: DC

## 2018-08-07 MED ORDER — ACETAMINOPHEN 650 MG RE SUPP: 650.0000 mg | Freq: Four times a day (QID) | RECTAL | Status: DC | PRN

## 2018-08-07 MED ORDER — POLYETHYLENE GLYCOL 3350 17 G PO PACK
17.0000 g | PACK | Freq: Every day | ORAL | Status: DC
  Administered 2018-08-07: 17 g via ORAL
  Filled 2018-08-07: qty 1

## 2018-08-07 MED ORDER — IOPAMIDOL (ISOVUE-300) INJECTION 61%
100.0000 mL | Freq: Once | INTRAVENOUS | Status: AC | PRN
  Administered 2018-08-07: 100 mL via INTRAVENOUS

## 2018-08-07 MED ORDER — CIPROFLOXACIN IN D5W 400 MG/200ML IV SOLN
400.0000 mg | Freq: Once | INTRAVENOUS | Status: AC
  Administered 2018-08-07: 400 mg via INTRAVENOUS
  Filled 2018-08-07: qty 200

## 2018-08-07 MED ORDER — RIVAROXABAN 20 MG PO TABS
20.0000 mg | ORAL_TABLET | Freq: Every day | ORAL | Status: DC
  Administered 2018-08-07 – 2018-08-13 (×7): 20 mg via ORAL
  Filled 2018-08-07 (×7): qty 1

## 2018-08-07 MED ORDER — ACETAMINOPHEN 325 MG PO TABS
650.0000 mg | ORAL_TABLET | Freq: Four times a day (QID) | ORAL | Status: DC | PRN
  Administered 2018-08-07 – 2018-08-20 (×5): 650 mg via ORAL
  Filled 2018-08-07 (×5): qty 2

## 2018-08-07 MED ORDER — ONDANSETRON HCL 4 MG/2ML IJ SOLN
4.0000 mg | Freq: Four times a day (QID) | INTRAMUSCULAR | Status: DC | PRN
  Administered 2018-08-07 – 2018-08-19 (×10): 4 mg via INTRAVENOUS
  Filled 2018-08-07 (×10): qty 2

## 2018-08-07 MED ORDER — DEXTROSE-NACL 5-0.9 % IV SOLN
INTRAVENOUS | Status: DC
  Administered 2018-08-07 – 2018-08-14 (×12): via INTRAVENOUS

## 2018-08-07 MED ORDER — GLUCAGON HCL RDNA (DIAGNOSTIC) 1 MG IJ SOLR
INTRAMUSCULAR | Status: AC
  Administered 2018-08-07: 1 mg
  Filled 2018-08-07: qty 1

## 2018-08-07 MED ORDER — LIDOCAINE-PRILOCAINE 2.5-2.5 % EX CREA
TOPICAL_CREAM | Freq: Once | CUTANEOUS | Status: AC
  Administered 2018-08-07: 06:00:00 via TOPICAL
  Filled 2018-08-07: qty 5

## 2018-08-07 MED ORDER — METRONIDAZOLE IN NACL 5-0.79 MG/ML-% IV SOLN
500.0000 mg | Freq: Three times a day (TID) | INTRAVENOUS | Status: DC
  Administered 2018-08-07 – 2018-08-09 (×7): 500 mg via INTRAVENOUS
  Filled 2018-08-07 (×8): qty 100

## 2018-08-07 MED ORDER — INSULIN ASPART 100 UNIT/ML ~~LOC~~ SOLN: 0.0000 [IU] | Freq: Three times a day (TID) | SUBCUTANEOUS | Status: DC

## 2018-08-07 MED ORDER — HYDROMORPHONE HCL 1 MG/ML IJ SOLN
1.0000 mg | INTRAMUSCULAR | Status: DC | PRN
  Administered 2018-08-07 – 2018-08-20 (×113): 1 mg via INTRAVENOUS
  Filled 2018-08-07 (×113): qty 1

## 2018-08-07 MED ORDER — SODIUM CHLORIDE 0.9% FLUSH
10.0000 mL | INTRAVENOUS | Status: DC | PRN
  Administered 2018-08-11 – 2018-08-21 (×4): 10 mL
  Administered 2018-08-25: 20 mL
  Administered 2018-08-28: 10 mL
  Filled 2018-08-07 (×6): qty 40

## 2018-08-07 NOTE — ED Notes (Signed)
ED TO INPATIENT HANDOFF REPORT  Name/Age/Gender Alexander Fry 57 y.o. male  Code Status    Code Status Orders  (From admission, onward)         Start     Ordered   08/07/18 0227  Full code  Continuous     08/07/18 0227        Code Status History    Date Active Date Inactive Code Status Order ID Comments User Context   08/01/2018 1500 08/04/2018 1719 Full Code [id number]  [name], MD ED   04/21/2017 1741 04/23/2017 1218 Full Code [id number]  [name], MD Inpatient      Home/SNF/Other Home  Chief Complaint Abdominal Pain  Level of Care/Admitting Diagnosis ED Disposition    ED Disposition Condition [place]: [facility] [[id number]]  Level of Care: Med-Surg [16]  Diagnosis: Pancreatitis [076808]  Admitting Physician: [name] [[id number]]  Attending Physician: [name] [phone]  Estimated length of stay: past midnight tomorrow  Certification:: I certify this patient will need inpatient services for at least 2 midnights  PT Class (Do Not Modify): Inpatient [101]  PT Acc Code (Do Not Modify): Private [1]       Medical History Past Medical History:  Diagnosis Date  . Bilateral primary osteoarthritis of knee 04/03/2018  . Duodenitis   . Goals of care, counseling/discussion 12/22/2016  . History of radiation therapy 02/11/2017   SRT right posterior frontal 12 mm target 20 Gy, Right anterior frontal 39m 20 Gy  . History of radiation therapy 02/21/2017   SRT Right post parietal 47mtarget 20 Gy, left Post parietal 46m48m0 Gy, Right Occipital 4 mm 20Gy, Left Occipital 4 mm 20 Gy  . History of radiation therapy 02/25/2017, 02/28/2017, 03/02/2017   SRT L1 spine 27 Gy 3 fractions, L4 Spine 27 Gy 3 fractions  . History of radiation therapy 06/07/2017   SRS- Brain  . History of radiation therapy 04/03/2018   Right ilium, 8 Gy in 1 fraction for a total dose of 8 Gy  . Inguinal hernia of left side without obstruction or gangrene   . Iron  deficiency anemia due to chronic blood loss 05/23/2018  . Pneumonia    left lung  . Renal cell carcinoma, [place]  . 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 His vision has "spots" at times    Allergies Allergies  Allergen Reactions  . Feraheme [Ferumoxytol] Anaphylaxis and Other (See Comments)    Flush. Patient denies anaphylaxis.    IV Location/Drains/Wounds Patient Lines/Drains/Airways Status   Active Line/Drains/Airways    Name:   Placement date:   Placement time:   Site:   Days:   Implanted Port 02/06/18 Right   02/06/18    0953    -   182   Peripheral IV 08/06/18 Left Antecubital   08/06/18    2336    Antecubital   1          Labs/Imaging Results for orders placed or performed during the hospital encounter of 08/06/18 (from the past 48 hour(s))  Comprehensive metabolic panel     Status: Abnormal   Collection Time: 08/06/18 11:43 PM  Result Value Ref Range   Sodium 139 135 - 145 mmol/L   Potassium 3.2 (L) 3.5 - 5.1 mmol/L   Chloride 101 98 - 111 mmol/L   CO2 26 22 - 32 mmol/L   Glucose, Bld 105 (H) 70 -  99 mg/dL   BUN 7 6 - 20 mg/dL   Creatinine, Ser 0.40 (L) 0.61 - 1.24 mg/dL   Calcium 9.3 8.9 - 10.3 mg/dL   Total Protein 6.7 6.5 - 8.1 g/dL   Albumin 3.2 (L) 3.5 - 5.0 g/dL   AST 182 (H) 15 - 41 U/L   ALT 204 (H) 0 - 44 U/L   Alkaline Phosphatase 875 (H) 38 - 126 U/L   Total Bilirubin 6.8 (H) 0.3 - 1.2 mg/dL   GFR calc non Af Amer >60 >60 mL/min   GFR calc Af Amer >60 >60 mL/min    Comment: (NOTE) The eGFR has been calculated using the CKD EPI equation. This calculation has not been validated in all clinical situations. eGFR's persistently <60 mL/min signify possible Chronic Kidney Disease.    Anion gap 12 5 - 15    Comment: Performed at [facility], [place] [address] [place], [place] [address]  Lipase, blood     Status: Abnormal   Collection Time: 08/06/18 11:43 PM  Result Value Ref Range   Lipase 969 (H)  11 - 51 U/L    Comment: RESULTS CONFIRMED BY MANUAL DILUTION Performed at [facility], [place] [address] [place], [place] [address]   CBC with Differential     Status: Abnormal   Collection Time: 08/06/18 11:43 PM  Result Value Ref Range   WBC 6.5 4.0 - 10.5 K/uL   RBC 5.07 4.22 - 5.81 MIL/uL   Hemoglobin 12.4 (L) 13.0 - 17.0 g/dL   HCT 38.4 (L) 39.0 - 52.0 %   MCV 75.7 (L) 78.0 - 100.0 fL   MCH 24.5 (L) 26.0 - 34.0 pg   MCHC 32.3 30.0 - 36.0 g/dL   RDW 17.8 (H) 11.5 - 15.5 %   Platelets 248 150 - 400 K/uL   Neutrophils Relative % 81 %   Neutro Abs 5.2 1.7 - 7.7 K/uL   Lymphocytes Relative 11 %   Lymphs Abs 0.7 0.7 - 4.0 K/uL   Monocytes Relative 8 %   Monocytes Absolute 0.6 0.1 - 1.0 K/uL   Eosinophils Relative 0 %   Eosinophils Absolute 0.0 0.0 - 0.7 K/uL   Basophils Relative 0 %   Basophils Absolute 0.0 0.0 - 0.1 K/uL    Comment: Performed at [facility], [place] [address], [place], [place] [address]   Ct Abdomen Pelvis W Contrast  Result Date: 08/07/2018 CLINICAL DATA:  Generalized abdominal pain and bloating with constipation. One episode of emesis after discharge. EXAM: CT ABDOMEN AND PELVIS WITH CONTRAST TECHNIQUE: Multidetector CT imaging of the abdomen and pelvis was performed using the standard protocol following bolus administration of intravenous contrast. CONTRAST:  125m ISOVUE-300 IOPAMIDOL (ISOVUE-300) INJECTION 61%, 369mOMNIPAQUE IOHEXOL 300 MG/ML SOLN COMPARISON:  08/01/2018 CT, MRI 08/03/2018 of the liver FINDINGS: Lower chest: Redemonstration of subcarinal adenopathy measuring up to 2.1 cm in short axis. The tip of a port catheter is seen in the SVC-RA juncture. Stable low-density paraesophageal lymph node with central necrosis measuring 2.2 cm short axis is stable. Slight increase in small right effusion. Hepatobiliary: Stable perihepatic ascites with periportal edema. Nondistended gallbladder with gallstones present. No  significant extrahepatic biliary dilatation. Patent main portal veins. Small 1.1 cm right hepatic hypodensity consistent with a benign finding by recent MRI, series 2/14. Pancreas: No pancreatic mass or ductal dilatation. Spleen: Normal size spleen without mass. Adrenals/Urinary Tract: Normal right adrenal gland. Tiny hypodense nodule measuring 6 mm in the  left adrenal apex. Again noted is a large hypodense 9.7 x 8.8 cm (series 2/46) heterogeneous mass arising from the mid to lower pole of the right kidney with septations, hypoattenuation and calcifications unchanged from recent comparison. Simple cyst arising from the upper pole the left kidney is unchanged measuring 2.9 cm. No nephrolithiasis. The urinary bladder is decompressed slightly thick-walled as a result. No focal mural thickening or calculus. Stomach/Bowel: Contrast distended stomach without focal mural thickening or gastric fold thickening. Thickened second and third portion of the duodenum as before suspicious for duodenitis. The jejunum is nondistended and noninflamed. The distal and terminal ileum are unremarkable. The colon is stool-filled without obstruction or inflammation. The appendix is not confidently identified. Vascular/Lymphatic: Nonaneurysmal aorta. Patent branch vessels. Stable 2 cm left para-aortic lymphadenopathy, series 2/44. Additional retroperitoneal adenopathy is stable. Retrocrural adenopathy is also noted, the largest is on the right measuring 1.1 cm short axis and also stable, series 2/21. Reproductive: Small prostate Other: Small volume perihepatic, perisplenic and pelvic ascites. Musculoskeletal: Lytic bone lesions of L1 and L4 with degenerative disc disease L5-S1 is redemonstrated. Lytic lesions of the iliac bones remain stable as well left measuring 10 mm and on the right 21 mm. IMPRESSION: 1. Redemonstration of transmural thickening of the second and third portion of the duodenum suggestive of a duodenitis. There has been  some interval decrease in thickening suggested however. 2. Unchanged right dominant cystic mass and with septations and calcifications noted of the right kidney measuring at up to 9.7 cm. 3. Simple cyst of the left kidney measuring 2.9 cm. 4. Stable retrocrural and para-aortic lymphadenopathy. Stable subcarinal and paraesophageal adenopathy. 5. Lytic lesions of L1, L4 both iliac bones are unchanged. 6. Uncomplicated cholelithiasis. 7. 1.1 cm circumscribed hypodense lesion of the right hepatic benign features on MRI. Electronically Signed   By: Ashley Royalty M.D.   On: 08/07/2018 01:57   Dg Abd Acute W/chest  Result Date: 08/06/2018 CLINICAL DATA:  Abdominal pain EXAM: DG ABDOMEN ACUTE W/ 1V CHEST COMPARISON:  None. FINDINGS: Port in the anterior chest wall with tip in distal SVC. Normal mediastinum and cardiac silhouette. Normal pulmonary vasculature. No evidence of effusion, infiltrate, or pneumothorax. No acute bony abnormality. Moderate volume stool in the ascending colon. Gallstones noted. No dilated large or small bowel. Large RIGHT renal mass in the RIGHT lower quadrant not well appreciated. IMPRESSION: 1. No acute findings in the chest or abdomen. 2. Moderate volume stool in the ascending colon. 3. Large 10 cm RIGHT renal mass demonstrated on CT 08/01/2018 not appreciated. Electronically Signed   By: Suzy Bouchard M.D.   On: 08/06/2018 08:26    Pending Labs Unresulted Labs (From admission, onward)    Start     Ordered   08/07/18 0500  Comprehensive metabolic panel  Tomorrow morning,   R     08/07/18 0227   08/07/18 0500  CBC  Tomorrow morning,   R     08/07/18 0227   08/07/18 0500  Lipase, blood  Tomorrow morning,   R     08/07/18 0227   08/07/18 0500  Hepatitis panel, acute  Tomorrow morning,   R     08/07/18 0228   08/07/18 0500  Lipid panel  Tomorrow morning,   R     08/07/18 0228   08/07/18 0500  Lactate dehydrogenase  Tomorrow morning,   R     08/07/18 0228           Vitals/Pain Today's Vitals  08/06/18 2257 08/07/18 0001 08/07/18 0139 08/07/18 0312  BP: (!) 144/87   (!) 150/90  Pulse: 94   89  Resp: 18   16  Temp:      SpO2: 97%   99%  Weight:      Height:      PainSc:  5  3  10-Worst pain ever    Isolation Precautions No active isolations  Medications Medications  iopamidol (ISOVUE-300) 61 % injection (has no administration in time range)  morphine 4 MG/ML injection (has no administration in time range)  0.9 %  sodium chloride infusion (has no administration in time range)  acetaminophen (TYLENOL) tablet 650 mg (has no administration in time range)    Or  acetaminophen (TYLENOL) suppository 650 mg (has no administration in time range)  ondansetron (ZOFRAN) injection 4 mg (has no administration in time range)  HYDROmorphone (DILAUDID) injection 1 mg (1 mg Intravenous Given 08/07/18 0307)  pantoprazole (PROTONIX) EC tablet 40 mg (40 mg Oral Given 08/07/18 0307)  metroNIDAZOLE (FLAGYL) IVPB 500 mg (has no administration in time range)  ciprofloxacin (CIPRO) IVPB 400 mg (400 mg Intravenous New Bag/Given 08/07/18 0312)  sodium chloride 0.9 % bolus 1,000 mL (0 mLs Intravenous Stopped 08/07/18 0042)  morphine 4 MG/ML injection 4 mg (4 mg Intravenous Given 08/06/18 2337)  ondansetron (ZOFRAN) injection 4 mg (4 mg Intravenous Given 08/06/18 2336)  iohexol (OMNIPAQUE) 300 MG/ML solution 30 mL (30 mLs Oral Contrast Given 08/07/18 0119)  iopamidol (ISOVUE-300) 61 % injection 100 mL (100 mLs Intravenous Contrast Given 08/07/18 0119)  morphine 4 MG/ML injection 4 mg (4 mg Intravenous Given 08/07/18 0109)    Mobility Walks

## 2018-08-07 NOTE — Progress Notes (Signed)
Pharmacy Antibiotic Note  Alexander Fry is a 57 y.o. male admitted on 08/06/2018 with Intra-abdominal infection.  Pharmacy has been consulted for Ciprofloxacin dosing.  Plan: Ciprofloxacin 400mg  iv q12hr   Height: 6' (182.9 cm) Weight: 215 lb (97.5 kg) IBW/kg (Calculated) : 77.6  Temp (24hrs), Avg:97.9 F (36.6 C), Min:97.3 F (36.3 C), Max:98.4 F (36.9 C)  Recent Labs  Lab 08/01/18 1150 08/02/18 0535 08/03/18 0429 08/04/18 0436 08/06/18 2343  WBC 4.3 5.4 4.3 11.7* 6.5  CREATININE 0.40* 0.41* 0.42* 0.57* 0.40*    Estimated Creatinine Clearance: 121.9 mL/min (A) (by C-G formula based on SCr of 0.4 mg/dL (L)).    Allergies  Allergen Reactions  . Feraheme [Ferumoxytol] Anaphylaxis and Other (See Comments)    Flush. Patient denies anaphylaxis.    Antimicrobials this admission: Ciprofloxacin 08/07/2018 >> Flagyl 08/07/2018 >>  Dose adjustments this admission: -  Microbiology results: -  Thank you for allowing pharmacy to be a part of this patient's care.  Nani Skillern Crowford 08/07/2018 3:20 AM

## 2018-08-07 NOTE — Progress Notes (Signed)
Subjective: Upper abdominal pain.  Objective: Vital signs in last 24 hours: Temp:  [98.3 F (36.8 C)-98.4 F (36.9 C)] 98.3 F (36.8 C) (09/02 0352) Pulse Rate:  [89-96] 96 (09/02 0352) Resp:  [16-20] 20 (09/02 0352) BP: (140-155)/(87-95) 140/95 (09/02 0352) SpO2:  [97 %-100 %] 100 % (09/02 0352) Weight:  [97.5 kg] 97.5 kg (09/01 2255) Last BM Date: 08/04/18  Intake/Output from previous day: 09/01 0701 - 09/02 0700 In: 1627.3 [P.O.:325; I.V.:18; IV Piggyback:1284.3] Out: 700 [Urine:700] Intake/Output this shift: Total I/O In: -  Out: 150 [Urine:150]  General appearance: alert and no distress GI: tender in the upper abdomen  Lab Results: Recent Labs    08/06/18 2343 08/07/18 0448  WBC 6.5 6.0  HGB 12.4* 12.0*  HCT 38.4* 37.4*  PLT 248 224   BMET Recent Labs    08/06/18 2343 08/07/18 0448  NA 139 139  K 3.2* 3.7  CL 101 102  CO2 26 29  GLUCOSE 105* 118*  BUN 7 5*  CREATININE 0.40* 0.42*  CALCIUM 9.3 8.6*   LFT Recent Labs    08/07/18 0448  PROT 5.8*  ALBUMIN 2.7*  AST 161*  ALT 177*  ALKPHOS 749*  BILITOT 6.1*   PT/INR No results for input(s): LABPROT, INR in the last 72 hours. Hepatitis Panel No results for input(s): HEPBSAG, HCVAB, HEPAIGM, HEPBIGM in the last 72 hours. C-Diff No results for input(s): CDIFFTOX in the last 72 hours. Fecal Lactopherrin No results for input(s): FECLLACTOFRN in the last 72 hours.  Studies/Results: Ct Abdomen Pelvis W Contrast  Result Date: 08/07/2018 CLINICAL DATA:  Generalized abdominal pain and bloating with constipation. One episode of emesis after discharge. EXAM: CT ABDOMEN AND PELVIS WITH CONTRAST TECHNIQUE: Multidetector CT imaging of the abdomen and pelvis was performed using the standard protocol following bolus administration of intravenous contrast. CONTRAST:  174mL ISOVUE-300 IOPAMIDOL (ISOVUE-300) INJECTION 61%, 31mL OMNIPAQUE IOHEXOL 300 MG/ML SOLN COMPARISON:  08/01/2018 CT, MRI 08/03/2018 of the  liver FINDINGS: Lower chest: Redemonstration of subcarinal adenopathy measuring up to 2.1 cm in short axis. The tip of a port catheter is seen in the SVC-RA juncture. Stable low-density paraesophageal lymph node with central necrosis measuring 2.2 cm short axis is stable. Slight increase in small right effusion. Hepatobiliary: Stable perihepatic ascites with periportal edema. Nondistended gallbladder with gallstones present. No significant extrahepatic biliary dilatation. Patent main portal veins. Small 1.1 cm right hepatic hypodensity consistent with a benign finding by recent MRI, series 2/14. Pancreas: No pancreatic mass or ductal dilatation. Spleen: Normal size spleen without mass. Adrenals/Urinary Tract: Normal right adrenal gland. Tiny hypodense nodule measuring 6 mm in the left adrenal apex. Again noted is a large hypodense 9.7 x 8.8 cm (series 2/46) heterogeneous mass arising from the mid to lower pole of the right kidney with septations, hypoattenuation and calcifications unchanged from recent comparison. Simple cyst arising from the upper pole the left kidney is unchanged measuring 2.9 cm. No nephrolithiasis. The urinary bladder is decompressed slightly thick-walled as a result. No focal mural thickening or calculus. Stomach/Bowel: Contrast distended stomach without focal mural thickening or gastric fold thickening. Thickened second and third portion of the duodenum as before suspicious for duodenitis. The jejunum is nondistended and noninflamed. The distal and terminal ileum are unremarkable. The colon is stool-filled without obstruction or inflammation. The appendix is not confidently identified. Vascular/Lymphatic: Nonaneurysmal aorta. Patent branch vessels. Stable 2 cm left para-aortic lymphadenopathy, series 2/44. Additional retroperitoneal adenopathy is stable. Retrocrural adenopathy is also noted, the largest  is on the right measuring 1.1 cm short axis and also stable, series 2/21. Reproductive:  Small prostate Other: Small volume perihepatic, perisplenic and pelvic ascites. Musculoskeletal: Lytic bone lesions of L1 and L4 with degenerative disc disease L5-S1 is redemonstrated. Lytic lesions of the iliac bones remain stable as well left measuring 10 mm and on the right 21 mm. IMPRESSION: 1. Redemonstration of transmural thickening of the second and third portion of the duodenum suggestive of a duodenitis. There has been some interval decrease in thickening suggested however. 2. Unchanged right dominant cystic mass and with septations and calcifications noted of the right kidney measuring at up to 9.7 cm. 3. Simple cyst of the left kidney measuring 2.9 cm. 4. Stable retrocrural and para-aortic lymphadenopathy. Stable subcarinal and paraesophageal adenopathy. 5. Lytic lesions of L1, L4 both iliac bones are unchanged. 6. Uncomplicated cholelithiasis. 7. 1.1 cm circumscribed hypodense lesion of the right hepatic benign features on MRI. Electronically Signed   By: Ashley Royalty M.D.   On: 08/07/2018 01:57   Dg Abd Acute W/chest  Result Date: 08/06/2018 CLINICAL DATA:  Abdominal pain EXAM: DG ABDOMEN ACUTE W/ 1V CHEST COMPARISON:  None. FINDINGS: Port in the anterior chest wall with tip in distal SVC. Normal mediastinum and cardiac silhouette. Normal pulmonary vasculature. No evidence of effusion, infiltrate, or pneumothorax. No acute bony abnormality. Moderate volume stool in the ascending colon. Gallstones noted. No dilated large or small bowel. Large RIGHT renal mass in the RIGHT lower quadrant not well appreciated. IMPRESSION: 1. No acute findings in the chest or abdomen. 2. Moderate volume stool in the ascending colon. 3. Large 10 cm RIGHT renal mass demonstrated on CT 08/01/2018 not appreciated. Electronically Signed   By: Suzy Bouchard M.D.   On: 08/06/2018 08:26    Medications:  Scheduled: . docusate sodium  100 mg Oral BID  . insulin aspart  0-5 Units Subcutaneous QHS  . insulin aspart  0-9  Units Subcutaneous TID WC  . metoCLOPramide  10 mg Oral QID  . pantoprazole  40 mg Oral BID  . polyethylene glycol  17 g Oral Daily  . rivaroxaban  20 mg Oral Q supper   Continuous: . sodium chloride 150 mL/hr at 08/07/18 0517  . ciprofloxacin    . metronidazole Stopped (08/07/18 0513)    Assessment/Plan: 1) Duodenitis with ulceration. 2) Elevated lipase. 3) Cholelithiasis. 4) Metastatic renal cell carcinoma.   The patient was well at the time of discharge in that he did not report any abdominal pain.  He was tolerating PO without any difficulty, however, over this weekend he was eating more food.  The patient was not certain if this was worsening the situation.  The duodenal ulceration was thought to be secondary to Afinitor, which Dr. Marin Olp has held.  He represents with the same type of abdominal pain, but this time his lipase is markedly elevated, but there is no inflammation noted on the CT scan with the pancreas and no evidence of biliary ductal dilation.  His gallbladder does not appear to be infected.  Ulcers in the GI stomach and small bowel can cause an increase in the lipase, however, his values are higher than expected.  I am doubtful that he truly has a pancreatitis given the lack of inflammation noted on the CT scan.  He appears to be euvolemic, but a repeat CT scan may be necessary to see if a pancreatitis has "blossomed" with IV hydration.    Plan: 1) Strict NPO. 2) Continue pain  medication. 3) If the pain does not improve or resolve, a CT scan should be repeated Wednesday or Thursday to see if a pancreatitis is visible on imaging.  LOS: 0 days   Gwendolyn Nishi D 08/07/2018, 1:20 PM

## 2018-08-07 NOTE — H&P (Signed)
TRH H&P   Patient Demographics:    Alexander Fry, is a 57 y.o. male  MRN: [id number]   DOB - 18-Jun-1961  Admit Date - 08/06/2018  Outpatient Primary MD for the patient is [name], PA-C  Referring MD/NP/PA: Veryl Speak  Outpatient Specialists:  Burney Gauze Carol Ada  Patient coming from: home  Chief Complaint  Patient presents with  . Abdominal Pain  . Constipation      HPI:    Alexander Fry  is a 57 y.o. male, w hx of metastatic renal cell carcinoma (bone, brain, lung), h/o R IJ thrombus, Iron deficiency anemia,  w recent admission for abdominal pain where he was found to have duodenitis.  Pt apparently went home and continued to have abdominal pain which was worse yesterday associated with some n/v.  Pt denies fever, chills, diarrhea, brbpr, black stool.  Pt has been taking protonix.  Denies NSAIDS.    In Ed,  T 98.4, P 94, Bp 144/87  Pox 97% on RA  Na 139, K 3.2, Bun 7, Creatinine 0.4   Ast 182, Alt 204, Alk phos 875,  T. Bili 6.8 Lipase  969  Wbc 6.5, Hgb 12.4, Plt 248  Pt will be admitted for pancreatitis, and abdominal pain, and hypokalemia.      Review of systems:    In addition to the HPI above,  No Fever-chills, No Headache, No changes with Vision or hearing, No problems swallowing food or Liquids, No Chest pain, Cough or Shortness of Breath,  No Blood in stool or Urine, No dysuria, No new skin rashes or bruises, No new joints pains-aches,  No new weakness, tingling, numbness in any extremity, No recent weight gain or loss, No polyuria, polydypsia or polyphagia, No significant Mental Stressors.  A full 10 point Review of Systems was done, except as stated above, all other Review of Systems were negative.   With Past History of the following :    Past Medical History:  Diagnosis Date  . Bilateral primary osteoarthritis of  knee 04/03/2018  . Goals of care, counseling/discussion 12/22/2016  . History of radiation therapy 02/11/2017   SRT right posterior frontal 12 mm target 20 Gy, Right anterior frontal 16m 20 Gy  . History of radiation therapy 02/21/2017   SRT Right post parietal 449mtarget 20 Gy, left Post parietal 40m30m0 Gy, Right Occipital 4 mm 20Gy, Left Occipital 4 mm 20 Gy  . History of radiation therapy 02/25/2017, 02/28/2017, 03/02/2017   SRT L1 spine 27 Gy 3 fractions, L4 Spine 27 Gy 3 fractions  . History of radiation therapy 06/07/2017   SRS- Brain  . History of radiation therapy 04/03/2018   Right ilium, 8 Gy in 1 fraction for a total dose of 8 Gy  . Inguinal hernia of left side without obstruction or gangrene   . Iron deficiency anemia due to chronic  blood loss 05/23/2018  . Pneumonia    left lung  . Renal cell carcinoma, right ([place]) 01/05/2017  . 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 His vision has "spots" at times      Past Surgical History:  Procedure Laterality Date  . BIOPSY  08/02/2018   Procedure: BIOPSY;  Surgeon: Juanita Craver, MD;  Location: [facility];  Service: Endoscopy;;  . ESOPHAGOGASTRODUODENOSCOPY N/A 08/02/2018   Procedure: ESOPHAGOGASTRODUODENOSCOPY (EGD);  Surgeon: Juanita Craver, MD;  Location: [facility];  Service: Endoscopy;  Laterality: N/A;  . HERNIA REPAIR    . INGUINAL HERNIA REPAIR Left   . IR FLUORO GUIDE PORT INSERTION RIGHT  02/06/2018  . IR US GUIDE VASC ACCESS RIGHT  02/06/2018      Social History:     Social History   Tobacco Use  . Smoking status: Never Smoker  . Smokeless tobacco: Never Used  Substance Use Topics  . Alcohol use: Yes    Comment: social use in the past     Lives - at home Divorced  Mobility - walks by self   Family History :     Family History  Problem Relation Age of Onset  . Heart disease Mother   . Diabetes Mother   . Heart disease Father   . Alcohol abuse Brother        Home Medications:    Prior to Admission medications   Medication Sig Start Date End Date Taking? Authorizing Provider  ciprofloxacin (CIPRO) 500 MG tablet Take 1 tablet (500 mg total) by mouth 2 (two) times daily for 4 days. 08/04/18 08/08/18 Yes Donne Hazel, MD  docusate sodium (COLACE) 100 MG capsule Take 1 capsule (100 mg total) by mouth 2 (two) times daily. 08/04/18 09/03/18 Yes Donne Hazel, MD  fentaNYL (DURAGESIC - DOSED MCG/HR) 25 MCG/HR patch Place 1 patch (25 mcg total) onto the skin every 3 (three) days. 07/27/18  Yes [place], Holli Humbles, NP  glimepiride (AMARYL) 4 MG tablet Take 1 tablet (4 mg total) by mouth daily with breakfast. 07/20/18  Yes [place], Rudell Cobb, MD  HYDROmorphone (DILAUDID) 4 MG tablet Take 1 tablet (4 mg total) by mouth every 6 (six) hours as needed for moderate pain or severe pain. 07/27/18  Yes [place], Holli Humbles, NP  metoCLOPramide (REGLAN) 10 MG tablet Take 10 mg by mouth 4 (four) times daily.   Yes [provider]  metroNIDAZOLE (FLAGYL) 500 MG tablet Take 1 tablet (500 mg total) by mouth 3 (three) times daily for 4 days. 08/04/18 08/08/18 Yes Donne Hazel, MD  pantoprazole (PROTONIX) 40 MG tablet Take 1 tablet (40 mg total) by mouth daily. 08/05/18 09/04/18 Yes Donne Hazel, MD  polyethylene glycol [facility]) packet Take 17 g by mouth daily. 08/04/18 09/03/18 Yes Donne Hazel, MD  rivaroxaban (XARELTO) 10 MG TABS tablet Take 1 tablet (10 mg total) by mouth daily with supper. Patient taking differently: Take 20 mg by mouth daily with supper.  06/21/18  Yes [place], Holli Humbles, NP  dexamethasone (DECADRON) 0.5 MG/5ML solution Take 10 mLs (1 mg total) by mouth 4 (four) times daily. Swish in mouth for 2 minutes and spit out. Avoid eating/drinking for 1hr after rinse Patient not taking: Reported on 08/06/2018 06/02/18   [name], MD  everolimus (AFINITOR) 5 MG tablet Take 1 tablet (5 mg total) by mouth daily. Patient not taking: Reported on 08/06/2018 06/02/18   [name], MD  Allergies:     Allergies  Allergen Reactions  . Feraheme [Ferumoxytol] Anaphylaxis and Other (See Comments)    Flush. Patient denies anaphylaxis.     Physical Exam:   Vitals  Blood pressure (!) 144/87, pulse 94, temperature 98.4 F (36.9 C), resp. rate 18, height 6' (1.829 m), weight 97.5 kg, SpO2 97 %.   1. General  lying in bed in NAD,  2. Normal affect and insight, Not Suicidal or Homicidal, Awake Alert, Oriented X 3.  3. No F.N deficits, ALL C.Nerves Intact, Strength 5/5 all 4 extremities, Sensation intact all 4 extremities, Plantars down going.  4. Ears and Eyes appear Normal, Conjunctivae clear, PERRLA. Moist Oral Mucosa.  5. Supple Neck, No JVD, No cervical lymphadenopathy appriciated, No Carotid Bruits.  6. Symmetrical Chest wall movement, Good air movement bilaterally, CTAB.  7. RRR, No Gallops, Rubs or Murmurs, No Parasternal Heave.  8. Positive Bowel Sounds, Abdomen Soft, No tenderness, No organomegaly appriciated,No rebound -guarding or rigidity.  9.  No Cyanosis, Normal Skin Turgor, No Skin Rash or Bruise.  10. Good muscle tone,  joints appear normal , no effusions, Normal ROM.  11. No Palpable Lymph Nodes in Neck or Axillae      Data Review:    CBC Recent Labs  Lab 07/31/18 0916 08/01/18 1150 08/02/18 0535 08/03/18 0429 08/04/18 0436 08/06/18 2343  WBC 4.8 4.3 5.4 4.3 11.7* 6.5  HGB 12.3* 15.8 11.9* 11.5* 10.9* 12.4*  HCT 37.9* 47.4 37.9* 35.9* 34.8* 38.4*  PLT 203 165 217 170 212 248  MCV 74.9* 74.4* 76.3* 75.4* 76.8* 75.7*  MCH 24.3* 24.8* 23.9* 24.2* 24.1* 24.5*  MCHC 32.5 33.3 31.4 32.0 31.3 32.3  RDW 15.3 15.7* 16.2* 16.4* 16.9* 17.8*  LYMPHSABS 0.9  --   --   --  0.7 0.7  MONOABS 0.5  --   --   --  0.4 0.6  EOSABS 0.1  --   --   --  0.0 0.0  BASOSABS 0.1  --   --   --  0.0 0.0   ------------------------------------------------------------------------------------------------------------------  Chemistries   Recent Labs  Lab 07/31/18 0916 08/01/18 1150 08/02/18 0535 08/03/18 0429 08/04/18 0436 08/06/18 2343  NA 135 140 139 139 140 139  K 3.1* 3.2* 4.2 4.5 4.2 3.2*  CL 98 99 105 105 104 101  CO2 _0 GLUCOSE 115 112* 114* 237* 173* 105*  BUN 6* 7 5* _1 CREATININE 0.60 0.40* 0.41* 0.42* 0.57* 0.40*  CALCIUM 9.6 9.3 8.7* 9.2 8.9 9.3  AST 185* 187*  --  144* 90* 182*  ALT 151* 183*  --  164* 143* 204*  ALKPHOS 733* 802*  --  727* 630* 875*  BILITOT 4.1* 5.8*  --  4.8* 1.9* 6.8*   ------------------------------------------------------------------------------------------------------------------ estimated creatinine clearance is 121.9 mL/min (A) (by C-G formula based on SCr of 0.4 mg/dL (L)). ------------------------------------------------------------------------------------------------------------------ No results for input(s): TSH, T4TOTAL, T3FREE, THYROIDAB in the last 72 hours.  Invalid input(s): FREET3  Coagulation profile No results for input(s): INR, PROTIME in the last 168 hours. ------------------------------------------------------------------------------------------------------------------- No results for input(s): DDIMER in the last 72 hours. -------------------------------------------------------------------------------------------------------------------  Cardiac Enzymes No results for input(s): CKMB, TROPONINI, MYOGLOBIN in the last 168 hours.  Invalid input(s): CK ------------------------------------------------------------------------------------------------------------------ No results found for: BNP   ---------------------------------------------------------------------------------------------------------------  Urinalysis    Component Value Date/Time   COLORURINE AMBER (A) 08/01/2018 1107   APPEARANCEUR CLEAR 08/01/2018 1107   LABSPEC 1.021 08/01/2018 1107  PHURINE 5.0 08/01/2018 1107   GLUCOSEU 50 (A) 08/01/2018 1107   HGBUR SMALL  (A) 08/01/2018 1107   BILIRUBINUR MODERATE (A) 08/01/2018 1107   KETONESUR 80 (A) 08/01/2018 1107   PROTEINUR 30 (A) 08/01/2018 1107   NITRITE NEGATIVE 08/01/2018 1107   LEUKOCYTESUR SMALL (A) 08/01/2018 1107    ----------------------------------------------------------------------------------------------------------------   Imaging Results:    Ct Abdomen Pelvis W Contrast  Result Date: 08/07/2018 CLINICAL DATA:  Generalized abdominal pain and bloating with constipation. One episode of emesis after discharge. EXAM: CT ABDOMEN AND PELVIS WITH CONTRAST TECHNIQUE: Multidetector CT imaging of the abdomen and pelvis was performed using the standard protocol following bolus administration of intravenous contrast. CONTRAST:  127m ISOVUE-300 IOPAMIDOL (ISOVUE-300) INJECTION 61%, 311mOMNIPAQUE IOHEXOL 300 MG/ML SOLN COMPARISON:  08/01/2018 CT, MRI 08/03/2018 of the liver FINDINGS: Lower chest: Redemonstration of subcarinal adenopathy measuring up to 2.1 cm in short axis. The tip of a port catheter is seen in the SVC-RA juncture. Stable low-density paraesophageal lymph node with central necrosis measuring 2.2 cm short axis is stable. Slight increase in small right effusion. Hepatobiliary: Stable perihepatic ascites with periportal edema. Nondistended gallbladder with gallstones present. No significant extrahepatic biliary dilatation. Patent main portal veins. Small 1.1 cm right hepatic hypodensity consistent with a benign finding by recent MRI, series 2/14. Pancreas: No pancreatic mass or ductal dilatation. Spleen: Normal size spleen without mass. Adrenals/Urinary Tract: Normal right adrenal gland. Tiny hypodense nodule measuring 6 mm in the left adrenal apex. Again noted is a large hypodense 9.7 x 8.8 cm (series 2/46) heterogeneous mass arising from the mid to lower pole of the right kidney with septations, hypoattenuation and calcifications unchanged from recent comparison. Simple cyst arising from the upper  pole the left kidney is unchanged measuring 2.9 cm. No nephrolithiasis. The urinary bladder is decompressed slightly thick-walled as a result. No focal mural thickening or calculus. Stomach/Bowel: Contrast distended stomach without focal mural thickening or gastric fold thickening. Thickened second and third portion of the duodenum as before suspicious for duodenitis. The jejunum is nondistended and noninflamed. The distal and terminal ileum are unremarkable. The colon is stool-filled without obstruction or inflammation. The appendix is not confidently identified. Vascular/Lymphatic: Nonaneurysmal aorta. Patent branch vessels. Stable 2 cm left para-aortic lymphadenopathy, series 2/44. Additional retroperitoneal adenopathy is stable. Retrocrural adenopathy is also noted, the largest is on the right measuring 1.1 cm short axis and also stable, series 2/21. Reproductive: Small prostate Other: Small volume perihepatic, perisplenic and pelvic ascites. Musculoskeletal: Lytic bone lesions of L1 and L4 with degenerative disc disease L5-S1 is redemonstrated. Lytic lesions of the iliac bones remain stable as well left measuring 10 mm and on the right 21 mm. IMPRESSION: 1. Redemonstration of transmural thickening of the second and third portion of the duodenum suggestive of a duodenitis. There has been some interval decrease in thickening suggested however. 2. Unchanged right dominant cystic mass and with septations and calcifications noted of the right kidney measuring at up to 9.7 cm. 3. Simple cyst of the left kidney measuring 2.9 cm. 4. Stable retrocrural and para-aortic lymphadenopathy. Stable subcarinal and paraesophageal adenopathy. 5. Lytic lesions of L1, L4 both iliac bones are unchanged. 6. Uncomplicated cholelithiasis. 7. 1.1 cm circumscribed hypodense lesion of the right hepatic benign features on MRI. Electronically Signed   By: DaAshley Royalty.D.   On: 08/07/2018 01:57   Dg Abd Acute W/chest  Result Date:  08/06/2018 CLINICAL DATA:  Abdominal pain EXAM: DG ABDOMEN ACUTE W/ 1V CHEST COMPARISON:  None. FINDINGS: Port in the anterior chest wall with tip in distal SVC. Normal mediastinum and cardiac silhouette. Normal pulmonary vasculature. No evidence of effusion, infiltrate, or pneumothorax. No acute bony abnormality. Moderate volume stool in the ascending colon. Gallstones noted. No dilated large or small bowel. Large RIGHT renal mass in the RIGHT lower quadrant not well appreciated. IMPRESSION: 1. No acute findings in the chest or abdomen. 2. Moderate volume stool in the ascending colon. 3. Large 10 cm RIGHT renal mass demonstrated on CT 08/01/2018 not appreciated. Electronically Signed   By: Suzy Bouchard M.D.   On: 08/06/2018 08:26       Assessment & Plan:    Principal Problem:   Pancreatitis    Abdominal pain Pancreatitis ? Related to duodenitis  ? 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 Consider need MRCP r/o choledocholithiasis as cause of pancreatitis, please consider GI consultation in am  Hypokalemia Replete Check cmp in am  Anemia Check cbc in am  H/o R IJ thrombus Cont xarelto  Dm2 fsbs ac and qhs, ISS Hold Amaryl for now  Chronic narcotic use DC Fentanyl at patient request  Constipation Cont colace 100m3m bid Cont miralax prn   DVT Prophylaxis Xarelto- SCDs  AM Labs Ordered, also please review Full Orders  Family Communication: Admission, patients condition and plan of care including tests being ordered have been discussed with the patient who indicate understanding and agree with the plan and Code Status.  Code Status  FULL CODE  Likely DC to   home  Condition GUARDED    Consults called: none, consider GI consult in AM  Admission status:  Inpatient, pt will require hospitalization for pancreatitis. He will likely need iv fluid as well as iv pain medication for >2 nites necessitating an inpatient stay  Time spent in minutes : 70   JameJani Gravel on 08/07/2018 at 2:48 AM  Between 7am to 7pm - Pager - [phone] 7pm go to [email or web address] - password [email or web address] - Office  [phone]

## 2018-08-07 NOTE — Progress Notes (Signed)
[facility] PROGRESS NOTE    Progress Note  Alexander Fry  INO:[id number] DOB: 04-Aug-1961 DOA: 08/06/2018 PCP: [name], PA-C     Brief Narrative:   Alexander Fry is an 57 y.o. male past medical history of metastatic renal cell carcinoma, with a right IJ thrombus, with a recent admission for abdominal pain which was found to have duodenitis had an EGD during this time comes in for abdominal pain radiating to her back with some nausea and vomiting was found to have a pancreatitis  Assessment/Plan:   Abdominal pain to biliary pancreatitis: Patient n.p.o. continue IV fluids and IV narcotics. Abdomen and pelvis done on 08/06/2018 showed improving duodenitis. Consult GI.  History of duodenitis: Continue IV Cipro and Flagyl continue Protonix.  Abnormal LFTs: As elevation in alkaline phosphatase AST and ALT. Elevation of the alkaline phosphatase is likely due to metastatic disease to the bone.  Check a GGT. CT scan showed nondistended gallbladder is with multiple gallstone present no significant extrahepatic biliary dilation unchanged from previous CT scan done on 08/01/2018.  Hypokalemia: Replete recheck in the morning.  Normocytic anemia: Hemoglobin has remained stable.  Diabetes mellitus type 2 Continue sliding scale insulin.  Constipation MIralax once able to take orals  Metastatic renal cell carcinoma: Follow-up with oncology as an outpatient. The elevation in alkaline phosphatase is probably due to his metastatic disease to the bone.  DVT prophylaxis: lovenox Family Communication:None Disposition Plan/Barrier to D/C: unable to determine Code Status:     Code Status Orders  (From admission, onward)         Start     Ordered   08/07/18 0227  Full code  Continuous     08/07/18 0227        Code Status History    Date Active Date Inactive Code Status Order ID Comments User Context   08/01/2018 1500 08/04/2018 1719 Full Code [id number]  [name],  MD ED   04/21/2017 1741 04/23/2017 1218 Full Code [id number]  [name], MD Inpatient        IV Access:    Peripheral IV   Procedures and diagnostic studies:   Ct Abdomen Pelvis W Contrast  Result Date: 08/07/2018 CLINICAL DATA:  Generalized abdominal pain and bloating with constipation. One episode of emesis after discharge. EXAM: CT ABDOMEN AND PELVIS WITH CONTRAST TECHNIQUE: Multidetector CT imaging of the abdomen and pelvis was performed using the standard protocol following bolus administration of intravenous contrast. CONTRAST:  112mL ISOVUE-300 IOPAMIDOL (ISOVUE-300) INJECTION 61%, 41mL OMNIPAQUE IOHEXOL 300 MG/ML SOLN COMPARISON:  08/01/2018 CT, MRI 08/03/2018 of the liver FINDINGS: Lower chest: Redemonstration of subcarinal adenopathy measuring up to 2.1 cm in short axis. The tip of a port catheter is seen in the SVC-RA juncture. Stable low-density paraesophageal lymph node with central necrosis measuring 2.2 cm short axis is stable. Slight increase in small right effusion. Hepatobiliary: Stable perihepatic ascites with periportal edema. Nondistended gallbladder with gallstones present. No significant extrahepatic biliary dilatation. Patent main portal veins. Small 1.1 cm right hepatic hypodensity consistent with a benign finding by recent MRI, series 2/14. Pancreas: No pancreatic mass or ductal dilatation. Spleen: Normal size spleen without mass. Adrenals/Urinary Tract: Normal right adrenal gland. Tiny hypodense nodule measuring 6 mm in the left adrenal apex. Again noted is a large hypodense 9.7 x 8.8 cm (series 2/46) heterogeneous mass arising from the mid to lower pole of the right kidney with septations, hypoattenuation and calcifications unchanged from recent comparison. Simple cyst arising from the upper pole  the left kidney is unchanged measuring 2.9 cm. No nephrolithiasis. The urinary bladder is decompressed slightly thick-walled as a result. No focal mural thickening or calculus.  Stomach/Bowel: Contrast distended stomach without focal mural thickening or gastric fold thickening. Thickened second and third portion of the duodenum as before suspicious for duodenitis. The jejunum is nondistended and noninflamed. The distal and terminal ileum are unremarkable. The colon is stool-filled without obstruction or inflammation. The appendix is not confidently identified. Vascular/Lymphatic: Nonaneurysmal aorta. Patent branch vessels. Stable 2 cm left para-aortic lymphadenopathy, series 2/44. Additional retroperitoneal adenopathy is stable. Retrocrural adenopathy is also noted, the largest is on the right measuring 1.1 cm short axis and also stable, series 2/21. Reproductive: Small prostate Other: Small volume perihepatic, perisplenic and pelvic ascites. Musculoskeletal: Lytic bone lesions of L1 and L4 with degenerative disc disease L5-S1 is redemonstrated. Lytic lesions of the iliac bones remain stable as well left measuring 10 mm and on the right 21 mm. IMPRESSION: 1. Redemonstration of transmural thickening of the second and third portion of the duodenum suggestive of a duodenitis. There has been some interval decrease in thickening suggested however. 2. Unchanged right dominant cystic mass and with septations and calcifications noted of the right kidney measuring at up to 9.7 cm. 3. Simple cyst of the left kidney measuring 2.9 cm. 4. Stable retrocrural and para-aortic lymphadenopathy. Stable subcarinal and paraesophageal adenopathy. 5. Lytic lesions of L1, L4 both iliac bones are unchanged. 6. Uncomplicated cholelithiasis. 7. 1.1 cm circumscribed hypodense lesion of the right hepatic benign features on MRI. Electronically Signed   By: Ashley Royalty M.D.   On: 08/07/2018 01:57   Dg Abd Acute W/chest  Result Date: 08/06/2018 CLINICAL DATA:  Abdominal pain EXAM: DG ABDOMEN ACUTE W/ 1V CHEST COMPARISON:  None. FINDINGS: Port in the anterior chest wall with tip in distal SVC. Normal mediastinum and  cardiac silhouette. Normal pulmonary vasculature. No evidence of effusion, infiltrate, or pneumothorax. No acute bony abnormality. Moderate volume stool in the ascending colon. Gallstones noted. No dilated large or small bowel. Large RIGHT renal mass in the RIGHT lower quadrant not well appreciated. IMPRESSION: 1. No acute findings in the chest or abdomen. 2. Moderate volume stool in the ascending colon. 3. Large 10 cm RIGHT renal mass demonstrated on CT 08/01/2018 not appreciated. Electronically Signed   By: Suzy Bouchard M.D.   On: 08/06/2018 08:26     Medical Consultants:    None.  Anti-Infectives:   Cipro and flagyl  Subjective:    Alexander Fry is to be nauseated some pain with radiation to his back his narcotics are working for pain control.  Objective:    Vitals:   08/06/18 2255 08/06/18 2257 08/07/18 0312 08/07/18 0352  BP:  (!) 144/87 (!) 150/90 (!) 140/95  Pulse:  94 89 96  Resp:  18 16 20   Temp:    98.3 F (36.8 C)  TempSrc:    Oral  SpO2:  97% 99% 100%  Weight: 97.5 kg     Height: 6' (1.829 m)       Intake/Output Summary (Last 24 hours) at 08/07/2018 1015 Last data filed at 08/07/2018 0700 Gross per 24 hour  Intake 1627.27 ml  Output 700 ml  Net 927.27 ml   Filed Weights   08/06/18 2255  Weight: 97.5 kg    Exam: General exam: In no acute distress. Respiratory system: Good air movement and clear to auscultation. Cardiovascular system: S1 & S2 heard, RRR. No JVD. Gastrointestinal system: Abdomen is  nondistended, soft and nontender.  Central nervous system: Alert and oriented. No focal neurological deficits. Extremities: No pedal edema. Skin: No rashes, lesions or ulcers Psychiatry: Judgement and insight appear normal. Mood & affect appropriate.    Data Reviewed:    Labs: Basic Metabolic Panel: Recent Labs  Lab 08/02/18 0535 08/03/18 0429 08/04/18 0436 08/06/18 2343 08/07/18 0448  NA 139 139 140 139 139  K 4.2 4.5 4.2 3.2* 3.7  CL 105 105 104  101 102  CO2 26 25 27 26 29   GLUCOSE 114* 237* 173* 105* 118*  BUN 5* 8 9 7  5*  CREATININE 0.41* 0.42* 0.57* 0.40* 0.42*  CALCIUM 8.7* 9.2 8.9 9.3 8.6*   GFR Estimated Creatinine Clearance: 121.9 mL/min (A) (by C-G formula based on SCr of 0.42 mg/dL (L)). Liver Function Tests: Recent Labs  Lab 08/01/18 1150 08/03/18 0429 08/04/18 0436 08/06/18 2343 08/07/18 0448  AST 187* 144* 90* 182* 161*  ALT 183* 164* 143* 204* 177*  ALKPHOS 802* 727* 630* 875* 749*  BILITOT 5.8* 4.8* 1.9* 6.8* 6.1*  PROT 7.1 6.0* 5.7* 6.7 5.8*  ALBUMIN 3.2* 2.5* 2.4* 3.2* 2.7*   Recent Labs  Lab 08/01/18 1150 08/06/18 2343 08/07/18 0448  LIPASE 121* 969* 572*   No results for input(s): AMMONIA in the last 168 hours. Coagulation profile No results for input(s): INR, PROTIME in the last 168 hours.  CBC: Recent Labs  Lab 08/02/18 0535 08/03/18 0429 08/04/18 0436 08/06/18 2343 08/07/18 0448  WBC 5.4 4.3 11.7* 6.5 6.0  NEUTROABS  --   --  10.6* 5.2  --   HGB 11.9* 11.5* 10.9* 12.4* 12.0*  HCT 37.9* 35.9* 34.8* 38.4* 37.4*  MCV 76.3* 75.4* 76.8* 75.7* 76.3*  PLT 217 170 212 248 224   Cardiac Enzymes: No results for input(s): CKTOTAL, CKMB, CKMBINDEX, TROPONINI in the last 168 hours. BNP (last 3 results) No results for input(s): PROBNP in the last 8760 hours. CBG: Recent Labs  Lab 08/03/18 2059 08/04/18 0734 08/04/18 1148 08/07/18 0402 08/07/18 0835  GLUCAP 197* 145* 191* 109* 83   D-Dimer: No results for input(s): DDIMER in the last 72 hours. Hgb A1c: No results for input(s): HGBA1C in the last 72 hours. Lipid Profile: Recent Labs    08/07/18 0448  CHOL 222*  HDL 57  LDLCALC 150*  TRIG 73  CHOLHDL 3.9   Thyroid function studies: No results for input(s): TSH, T4TOTAL, T3FREE, THYROIDAB in the last 72 hours.  Invalid input(s): FREET3 Anemia work up: No results for input(s): VITAMINB12, FOLATE, FERRITIN, TIBC, IRON, RETICCTPCT in the last 72 hours. Sepsis Labs: Recent  Labs  Lab 08/03/18 0429 08/04/18 0436 08/06/18 2343 08/07/18 0448  WBC 4.3 11.7* 6.5 6.0   Microbiology No results found for this or any previous visit (from the past 240 hour(s)).   Medications:   . docusate sodium  100 mg Oral BID  . insulin aspart  0-5 Units Subcutaneous QHS  . insulin aspart  0-9 Units Subcutaneous TID WC  . metoCLOPramide  10 mg Oral QID  . morphine      . pantoprazole  40 mg Oral BID  . polyethylene glycol  17 g Oral Daily  . rivaroxaban  20 mg Oral Q supper   Continuous Infusions: . sodium chloride 150 mL/hr at 08/07/18 0517  . ciprofloxacin    . metronidazole Stopped (08/07/18 0513)    LOS: 0 days   Charlynne Cousins  [facility] Pager [phone]  *Please refer to [email or web address], password  [email or web address] to get updated schedule on who will round on this patient, as hospitalists switch teams weekly. If 7PM-7AM, please contact night-coverage at [email or web address], password [email or web address] for any overnight needs.  08/07/2018, 10:15 AM

## 2018-08-07 NOTE — Consult Note (Signed)
Reason for Consult: pancreatitis Referring Physician: Dr Aileen Fass  [place] Alexander Fry is an 57 y.o. male.  HPI: Pt with hx of metastatic renal cell carcinoma (bone, brain, lung), h/o R IJ thrombus, Iron deficiency anemia,  w recent admission for abdominal pain where he was found to have duodenitis.  Pt apparently went home and continued to have abdominal pain which was worse yesterday associated with some n/v.  Pt denies fever, chills.  Past Medical History:  Diagnosis Date  . Bilateral primary osteoarthritis of knee 04/03/2018  . Duodenitis   . Goals of care, counseling/discussion 12/22/2016  . History of radiation therapy 02/11/2017   SRT right posterior frontal 12 mm target 20 Gy, Right anterior frontal 96m 20 Gy  . History of radiation therapy 02/21/2017   SRT Right post parietal 474mtarget 20 Gy, left Post parietal 24m5m0 Gy, Right Occipital 4 mm 20Gy, Left Occipital 4 mm 20 Gy  . History of radiation therapy 02/25/2017, 02/28/2017, 03/02/2017   SRT L1 spine 27 Gy 3 fractions, L4 Spine 27 Gy 3 fractions  . History of radiation therapy 06/07/2017   SRS- Brain  . History of radiation therapy 04/03/2018   Right ilium, 8 Gy in 1 fraction for a total dose of 8 Gy  . Inguinal hernia of left side without obstruction or gangrene   . Iron deficiency anemia due to chronic blood loss 05/23/2018  . Pneumonia    left lung  . Renal cell carcinoma, right ([place]/2018  . 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 His vision has "spots" at times    Past Surgical History:  Procedure Laterality Date  . BIOPSY  08/02/2018   Procedure: BIOPSY;  Surgeon: ManJuanita CraverD;  Location: [facility];  Service: Endoscopy;;  . ESOPHAGOGASTRODUODENOSCOPY N/A 08/02/2018   Procedure: ESOPHAGOGASTRODUODENOSCOPY (EGD);  Surgeon: ManJuanita CraverD;  [facility];  Service: Endoscopy;  Laterality: N/A;  . HERNIA REPAIR    . INGUINAL HERNIA REPAIR Left   . IR FLUORO GUIDE PORT INSERTION RIGHT   02/06/2018  . IR US [place] VASC ACCESS RIGHT  02/06/2018    Family History  Problem Relation Age of Onset  . Heart disease Mother   . Diabetes Mother   . Heart disease Father   . Alcohol abuse Brother     Social History:  reports that he has never smoked. He has never used smokeless tobacco. He reports that he drinks alcohol. He reports that he does not use drugs.  Allergies:  Allergies  Allergen Reactions  . Feraheme [Ferumoxytol] Anaphylaxis and Other (See Comments)    Flush. Patient denies anaphylaxis.    Medications: I have reviewed the patient's current medications.  Results for orders placed or performed during the hospital encounter of 08/06/18 (from the past 48 hour(s))  Comprehensive metabolic panel     Status: Abnormal   Collection Time: 08/06/18 11:43 PM  Result Value Ref Range   Sodium 139 135 - 145 mmol/L   Potassium 3.2 (L) 3.5 - 5.1 mmol/L   Chloride 101 98 - 111 mmol/L   CO2 26 22 - 32 mmol/L   Glucose, Bld 105 (H) 70 - 99 mg/dL   BUN 7 6 - 20 mg/dL   Creatinine, Ser 0.40 (L) 0.61 - 1.24 mg/dL   Calcium 9.3 8.9 - 10.3 mg/dL   Total Protein 6.7 6.5 - 8.1 g/dL   Albumin 3.2 (L) 3.5 - 5.0 g/dL   AST 182 (H) 15 -  41 U/L   ALT 204 (H) 0 - 44 U/L   Alkaline Phosphatase 875 (H) 38 - 126 U/L   Total Bilirubin 6.8 (H) 0.3 - 1.2 mg/dL   GFR calc non Af Amer >60 >60 mL/min   GFR calc Af Amer >60 >60 mL/min    Comment: (NOTE) The eGFR has been calculated using the CKD EPI equation. This calculation has not been validated in all clinical situations. eGFR's persistently <60 mL/min signify possible Chronic Kidney Disease.    Anion gap 12 5 - 15    Comment: Performed at [facility], [place] [address] [place], [place] [address]  Lipase, blood     Status: Abnormal   Collection Time: 08/06/18 11:43 PM  Result Value Ref Range   Lipase 969 (H) 11 - 51 U/L    Comment: RESULTS CONFIRMED BY MANUAL DILUTION Performed at [facility], [place] [address], [place], [place] [address]   CBC with Differential     Status: Abnormal   Collection Time: 08/06/18 11:43 PM  Result Value Ref Range   WBC 6.5 4.0 - 10.5 K/uL   RBC 5.07 4.22 - 5.81 MIL/uL   Hemoglobin 12.4 (L) 13.0 - 17.0 g/dL   HCT 38.4 (L) 39.0 - 52.0 %   MCV 75.7 (L) 78.0 - 100.0 fL   MCH 24.5 (L) 26.0 - 34.0 pg   MCHC 32.3 30.0 - 36.0 g/dL   RDW 17.8 (H) 11.5 - 15.5 %   Platelets 248 150 - 400 K/uL   Neutrophils Relative % 81 %   Neutro Abs 5.2 1.7 - 7.7 K/uL   Lymphocytes Relative 11 %   Lymphs Abs 0.7 0.7 - 4.0 K/uL   Monocytes Relative 8 %   Monocytes Absolute 0.6 0.1 - 1.0 K/uL   Eosinophils Relative 0 %   Eosinophils Absolute 0.0 0.0 - 0.7 K/uL   Basophils Relative 0 %   Basophils Absolute 0.0 0.0 - 0.1 K/uL    Comment: Performed at [facility], [place] [address], [place], [place] [address]  Glucose, capillary     Status: Abnormal   Collection Time: 08/07/18  4:02 AM  Result Value Ref Range   Glucose-Capillary 109 (H) 70 - 99 mg/dL  Comprehensive metabolic panel     Status: Abnormal   Collection Time: 08/07/18  4:48 AM  Result Value Ref Range   Sodium 139 135 - 145 mmol/L   Potassium 3.7 3.5 - 5.1 mmol/L   Chloride 102 98 - 111 mmol/L   CO2 29 22 - 32 mmol/L   Glucose, Bld 118 (H) 70 - 99 mg/dL   BUN 5 (L) 6 - 20 mg/dL   Creatinine, Ser 0.42 (L) 0.61 - 1.24 mg/dL   Calcium 8.6 (L) 8.9 - 10.3 mg/dL   Total Protein 5.8 (L) 6.5 - 8.1 g/dL   Albumin 2.7 (L) 3.5 - 5.0 g/dL   AST 161 (H) 15 - 41 U/L   ALT 177 (H) 0 - 44 U/L   Alkaline Phosphatase 749 (H) 38 - 126 U/L   Total Bilirubin 6.1 (H) 0.3 - 1.2 mg/dL   GFR calc non Af Amer >60 >60 mL/min   GFR calc Af Amer >60 >60 mL/min    Comment: (NOTE) The eGFR has been calculated using the CKD EPI equation. This calculation has not been validated in all clinical situations. eGFR's persistently <60 mL/min signify possible Chronic Kidney Disease.    Anion gap 8 5 - 15  Comment:  Performed at [facility], [place] [address], [place], [place] [address]  CBC     Status: Abnormal   Collection Time: 08/07/18  4:48 AM  Result Value Ref Range   WBC 6.0 4.0 - 10.5 K/uL   RBC 4.90 4.22 - 5.81 MIL/uL   Hemoglobin 12.0 (L) 13.0 - 17.0 g/dL   HCT 37.4 (L) 39.0 - 52.0 %   MCV 76.3 (L) 78.0 - 100.0 fL   MCH 24.5 (L) 26.0 - 34.0 pg   MCHC 32.1 30.0 - 36.0 g/dL   RDW 17.9 (H) 11.5 - 15.5 %   Platelets 224 150 - 400 K/uL    Comment: Performed at [facility], [place] [address], [place], [place] [address]  Lipase, blood     Status: Abnormal   Collection Time: 08/07/18  4:48 AM  Result Value Ref Range   Lipase 572 (H) 11 - 51 U/L    Comment: RESULTS CONFIRMED BY MANUAL DILUTION Performed at [facility], [place] [address], [place], [place] [address]   Lipid panel     Status: Abnormal   Collection Time: 08/07/18  4:48 AM  Result Value Ref Range   Cholesterol 222 (H) 0 - 200 mg/dL   Triglycerides 73 <150 mg/dL   HDL 57 >40 mg/dL   Total CHOL/HDL Ratio 3.9 RATIO   VLDL 15 0 - 40 mg/dL   LDL Cholesterol 150 (H) 0 - 99 mg/dL    Comment:        Total Cholesterol/HDL:CHD Risk Coronary Heart Disease Risk Table                     Men   Women  1/2 Average Risk   3.4   3.3  Average Risk       5.0   4.4  2 X Average Risk   9.6   7.1  3 X Average Risk  23.4   11.0        Use the calculated Patient Ratio above and the CHD Risk Table to determine the patient's CHD Risk.        ATP III CLASSIFICATION (LDL):  <100     mg/dL   Optimal  100-129  mg/dL   Near or Above                    Optimal  130-159  mg/dL   Borderline  160-189  mg/dL   High  >190     mg/dL   Very High Performed at [place] [address] [place], [place] [address]   Lactate dehydrogenase     Status: Abnormal   Collection Time: 08/07/18  4:48 AM  Result Value Ref Range   LDH 207 (H) 98 - 192 U/L    Comment: Performed at [facility], [place] [address] [place], [place] [address]  Glucose, capillary     Status: None   Collection Time: 08/07/18  8:35 AM  Result Value Ref Range   Glucose-Capillary 83 70 - 99 mg/dL    Ct Abdomen Pelvis W Contrast  Result Date: 08/07/2018 CLINICAL DATA:  Generalized abdominal pain and bloating with constipation. One episode of emesis after discharge. EXAM: CT ABDOMEN AND PELVIS WITH CONTRAST TECHNIQUE: Multidetector CT imaging of the abdomen and pelvis was performed using the standard protocol following bolus administration of intravenous contrast. CONTRAST:  120m ISOVUE-300 IOPAMIDOL (ISOVUE-300) INJECTION 61%, 356mOMNIPAQUE IOHEXOL 300 MG/ML  SOLN COMPARISON:  08/01/2018 CT, MRI 08/03/2018 of the liver FINDINGS: Lower chest: Redemonstration of subcarinal adenopathy measuring up to 2.1 cm in short axis. The tip of a port catheter is seen in the SVC-RA juncture. Stable low-density paraesophageal lymph node with central necrosis measuring 2.2 cm short axis is stable. Slight increase in small right effusion. Hepatobiliary: Stable perihepatic ascites with periportal edema. Nondistended gallbladder with gallstones present. No significant extrahepatic biliary dilatation. Patent main portal veins. Small 1.1 cm right hepatic hypodensity consistent with a benign finding by recent MRI, series 2/14. Pancreas: No pancreatic mass or ductal dilatation. Spleen: Normal size spleen without mass. Adrenals/Urinary Tract: Normal right adrenal gland. Tiny hypodense nodule measuring 6 mm in the left adrenal apex. Again noted is a large hypodense 9.7 x 8.8 cm (series 2/46) heterogeneous mass arising from the mid to lower pole of the right kidney with septations, hypoattenuation and calcifications unchanged from recent comparison. Simple cyst arising from the upper pole the left kidney is unchanged measuring 2.9 cm. No nephrolithiasis. The urinary bladder is decompressed slightly thick-walled as a result. No  focal mural thickening or calculus. Stomach/Bowel: Contrast distended stomach without focal mural thickening or gastric fold thickening. Thickened second and third portion of the duodenum as before suspicious for duodenitis. The jejunum is nondistended and noninflamed. The distal and terminal ileum are unremarkable. The colon is stool-filled without obstruction or inflammation. The appendix is not confidently identified. Vascular/Lymphatic: Nonaneurysmal aorta. Patent branch vessels. Stable 2 cm left para-aortic lymphadenopathy, series 2/44. Additional retroperitoneal adenopathy is stable. Retrocrural adenopathy is also noted, the largest is on the right measuring 1.1 cm short axis and also stable, series 2/21. Reproductive: Small prostate Other: Small volume perihepatic, perisplenic and pelvic ascites. Musculoskeletal: Lytic bone lesions of L1 and L4 with degenerative disc disease L5-S1 is redemonstrated. Lytic lesions of the iliac bones remain stable as well left measuring 10 mm and on the right 21 mm. IMPRESSION: 1. Redemonstration of transmural thickening of the second and third portion of the duodenum suggestive of a duodenitis. There has been some interval decrease in thickening suggested however. 2. Unchanged right dominant cystic mass and with septations and calcifications noted of the right kidney measuring at up to 9.7 cm. 3. Simple cyst of the left kidney measuring 2.9 cm. 4. Stable retrocrural and para-aortic lymphadenopathy. Stable subcarinal and paraesophageal adenopathy. 5. Lytic lesions of L1, L4 both iliac bones are unchanged. 6. Uncomplicated cholelithiasis. 7. 1.1 cm circumscribed hypodense lesion of the right hepatic benign features on MRI. Electronically Signed   By: Ashley Royalty M.D.   On: 08/07/2018 01:57   Dg Abd Acute W/chest  Result Date: 08/06/2018 CLINICAL DATA:  Abdominal pain EXAM: DG ABDOMEN ACUTE W/ 1V CHEST COMPARISON:  None. FINDINGS: Port in the anterior chest wall with tip in  distal SVC. Normal mediastinum and cardiac silhouette. Normal pulmonary vasculature. No evidence of effusion, infiltrate, or pneumothorax. No acute bony abnormality. Moderate volume stool in the ascending colon. Gallstones noted. No dilated large or small bowel. Large RIGHT renal mass in the RIGHT lower quadrant not well appreciated. IMPRESSION: 1. No acute findings in the chest or abdomen. 2. Moderate volume stool in the ascending colon. 3. Large 10 cm RIGHT renal mass demonstrated on CT 08/01/2018 not appreciated. Electronically Signed   By: Suzy Bouchard M.D.   On: 08/06/2018 08:26    Review of Systems  Constitutional: Negative for chills and fever.  HENT: Negative for hearing loss.   Eyes: Negative for blurred vision.  Respiratory: Negative for cough.   Cardiovascular: Negative for chest pain and palpitations.  Gastrointestinal: Positive for abdominal pain, nausea and vomiting.  Genitourinary: Negative for dysuria, frequency and urgency.  Musculoskeletal: Negative for myalgias.  Skin: Negative for itching and rash.  Neurological: Negative for dizziness.   Blood pressure (!) 140/95, pulse 96, temperature 98.3 F (36.8 C), temperature source Oral, resp. rate 20, height 6' (1.829 m), weight 97.5 kg, SpO2 100 %. Physical Exam  Constitutional: He is oriented to person, place, and time. He appears well-developed and well-nourished.  HENT:  Head: Normocephalic and atraumatic.  Eyes: Pupils are equal, round, and reactive to light. Conjunctivae and EOM are normal.  Neck: Normal range of motion. Neck supple.  Cardiovascular: Normal rate and regular rhythm.  Respiratory: Effort normal. No respiratory distress.  GI: Soft. There is tenderness.  Musculoskeletal: Normal range of motion.  Neurological: He is alert and oriented to person, place, and time.    Assessment/Plan: 57 y.o. M with metastatic RCC on anticoagulation for a RIJ thrombus.  He has duodenitis on CT as well that is slightly  worse.  All of this makes him a poor candidate for surgery at this time.  Recommend supportive care.  He can f/u as an outpatient to discuss surgery once his acute inflammation has resolved, but it appears to me his pancreatitis is probably more related to his surround duodenitis.  Will sign off.  Please call us for any questions or concerns.    Sander Remedios C. 02/06/9178, 15:05 AM

## 2018-08-08 LAB — COMPREHENSIVE METABOLIC PANEL
ALBUMIN: 2.6 g/dL — AB (ref 3.5–5.0)
ALT: 188 U/L — ABNORMAL HIGH (ref 0–44)
ANION GAP: 12 (ref 5–15)
AST: 198 U/L — AB (ref 15–41)
Alkaline Phosphatase: 917 U/L — ABNORMAL HIGH (ref 38–126)
BUN: 5 mg/dL — AB (ref 6–20)
CHLORIDE: 102 mmol/L (ref 98–111)
CO2: 26 mmol/L (ref 22–32)
Calcium: 8.6 mg/dL — ABNORMAL LOW (ref 8.9–10.3)
Creatinine, Ser: 0.44 mg/dL — ABNORMAL LOW (ref 0.61–1.24)
GFR calc Af Amer: >60 mL/min (ref >60)
GFR calc non Af Amer: >60 mL/min (ref >60)
GLUCOSE: 127 mg/dL — AB (ref 70–99)
POTASSIUM: 3.5 mmol/L (ref 3.5–5.1)
SODIUM: 140 mmol/L (ref 135–145)
Total Bilirubin: 7.9 mg/dL — ABNORMAL HIGH (ref 0.3–1.2)
Total Protein: 5.9 g/dL — ABNORMAL LOW (ref 6.5–8.1)

## 2018-08-08 LAB — GLUCOSE, CAPILLARY
GLUCOSE-CAPILLARY: 119 mg/dL — AB (ref 70–99)
GLUCOSE-CAPILLARY: 113 mg/dL — AB (ref 70–99)
GLUCOSE-CAPILLARY: 96 mg/dL (ref 70–99)
Glucose-Capillary: 127 mg/dL — ABNORMAL HIGH (ref 70–99)
Glucose-Capillary: 113 mg/dL — ABNORMAL HIGH (ref 70–99)
Glucose-Capillary: 109 mg/dL — ABNORMAL HIGH (ref 70–99)

## 2018-08-08 LAB — HEPATITIS PANEL, ACUTE
HCV Ab: <0.1 s/co ratio (ref 0.0–0.9)
HEP A IGM: NEGATIVE
HEP B C IGM: NEGATIVE
Hepatitis B Surface Ag: NEGATIVE

## 2018-08-08 MED ORDER — POLYETHYLENE GLYCOL 3350 17 G PO PACK
17.0000 g | PACK | Freq: Three times a day (TID) | ORAL | Status: DC
  Administered 2018-08-08 – 2018-08-09 (×2): 17 g via ORAL
  Filled 2018-08-08 (×2): qty 1

## 2018-08-08 MED ORDER — OXYCODONE HCL 5 MG PO TABS
5.0000 mg | ORAL_TABLET | Freq: Four times a day (QID) | ORAL | Status: DC | PRN
  Administered 2018-08-08 – 2018-08-09 (×2): 5 mg via ORAL
  Filled 2018-08-08 (×3): qty 1

## 2018-08-08 NOTE — Progress Notes (Signed)
Patient had an hypoglycemic event (cbg 57) in the day 08-07-18. After his cbg was 75 at 2109, I was concerned that there would be another hypoglycemic event. Paged Baltazar Najjar, NP. New order for D5NS and q4 hour cbg checks and SSI added to Epic. Continue to monitor.

## 2018-08-08 NOTE — Care Management Note (Signed)
Case Management Note  Patient Details  Name: Alexander Fry MRN: [id number] Date of Birth: 30-Aug-1961  Subjective/Objective:  Admitted wPancvreatitis. From home. No CM needs.                  Action/Plan:d/c home.   Expected Discharge Date:  08/10/18               Expected Discharge Plan:  Home/Self Care  In-House Referral:     Discharge planning Services  CM Consult  Post Acute Care Choice:    Choice offered to:     DME Arranged:    DME Agency:     HH Arranged:    HH Agency:     Status of Service:  In process, will continue to follow  If discussed at Long Length of Stay Meetings, dates discussed:    Additional Comments:  Dessa Phi, RN 08/08/2018, 11:01 AM

## 2018-08-08 NOTE — Progress Notes (Signed)
Subjective: Feeling well, but complains about constipation.  Objective: Vital signs in last 24 hours: Temp:  [97.5 F (36.4 C)-98.9 F (37.2 C)] 97.5 F (36.4 C) (09/03 0436) Pulse Rate:  [74-85] 74 (09/03 0436) Resp:  [12-20] 12 (09/03 0436) BP: (117-150)/(77-93) 118/81 (09/03 0436) SpO2:  [91 %-99 %] 93 % (09/03 0436) Weight:  [95 kg] 95 kg (09/03 0436) Last BM Date: 08/06/18(from enema per patient)  Intake/Output from previous day: 09/02 0701 - 09/03 0700 In: 1253.8 [I.V.:853.8; IV Piggyback:400] Out: 150 [Urine:150] Intake/Output this shift: No intake/output data recorded.  General appearance: alert and no distress GI: soft, non-tender; bowel sounds normal; no masses,  no organomegaly  Lab Results: Recent Labs    08/06/18 2343 08/07/18 0448  WBC 6.5 6.0  HGB 12.4* 12.0*  HCT 38.4* 37.4*  PLT 248 224   BMET Recent Labs    08/06/18 2343 08/07/18 0448  NA 139 139  K 3.2* 3.7  CL 101 102  CO2 26 29  GLUCOSE 105* 118*  BUN 7 5*  CREATININE 0.40* 0.42*  CALCIUM 9.3 8.6*   LFT Recent Labs    08/07/18 0448  PROT 5.8*  ALBUMIN 2.7*  AST 161*  ALT 177*  ALKPHOS 749*  BILITOT 6.1*   PT/INR No results for input(s): LABPROT, INR in the last 72 hours. Hepatitis Panel Recent Labs    08/07/18 0448  HEPBSAG Negative  HCVAB <0.1  HEPAIGM Negative  HEPBIGM Negative   C-Diff No results for input(s): CDIFFTOX in the last 72 hours. Fecal Lactopherrin No results for input(s): FECLLACTOFRN in the last 72 hours.  Studies/Results: Ct Abdomen Pelvis W Contrast  Result Date: 08/07/2018 CLINICAL DATA:  Generalized abdominal pain and bloating with constipation. One episode of emesis after discharge. EXAM: CT ABDOMEN AND PELVIS WITH CONTRAST TECHNIQUE: Multidetector CT imaging of the abdomen and pelvis was performed using the standard protocol following bolus administration of intravenous contrast. CONTRAST:  166mL ISOVUE-300 IOPAMIDOL (ISOVUE-300) INJECTION 61%,  69mL OMNIPAQUE IOHEXOL 300 MG/ML SOLN COMPARISON:  08/01/2018 CT, MRI 08/03/2018 of the liver FINDINGS: Lower chest: Redemonstration of subcarinal adenopathy measuring up to 2.1 cm in short axis. The tip of a port catheter is seen in the SVC-RA juncture. Stable low-density paraesophageal lymph node with central necrosis measuring 2.2 cm short axis is stable. Slight increase in small right effusion. Hepatobiliary: Stable perihepatic ascites with periportal edema. Nondistended gallbladder with gallstones present. No significant extrahepatic biliary dilatation. Patent main portal veins. Small 1.1 cm right hepatic hypodensity consistent with a benign finding by recent MRI, series 2/14. Pancreas: No pancreatic mass or ductal dilatation. Spleen: Normal size spleen without mass. Adrenals/Urinary Tract: Normal right adrenal gland. Tiny hypodense nodule measuring 6 mm in the left adrenal apex. Again noted is a large hypodense 9.7 x 8.8 cm (series 2/46) heterogeneous mass arising from the mid to lower pole of the right kidney with septations, hypoattenuation and calcifications unchanged from recent comparison. Simple cyst arising from the upper pole the left kidney is unchanged measuring 2.9 cm. No nephrolithiasis. The urinary bladder is decompressed slightly thick-walled as a result. No focal mural thickening or calculus. Stomach/Bowel: Contrast distended stomach without focal mural thickening or gastric fold thickening. Thickened second and third portion of the duodenum as before suspicious for duodenitis. The jejunum is nondistended and noninflamed. The distal and terminal ileum are unremarkable. The colon is stool-filled without obstruction or inflammation. The appendix is not confidently identified. Vascular/Lymphatic: Nonaneurysmal aorta. Patent branch vessels. Stable 2 cm left para-aortic lymphadenopathy,  series 2/44. Additional retroperitoneal adenopathy is stable. Retrocrural adenopathy is also noted, the largest is  on the right measuring 1.1 cm short axis and also stable, series 2/21. Reproductive: Small prostate Other: Small volume perihepatic, perisplenic and pelvic ascites. Musculoskeletal: Lytic bone lesions of L1 and L4 with degenerative disc disease L5-S1 is redemonstrated. Lytic lesions of the iliac bones remain stable as well left measuring 10 mm and on the right 21 mm. IMPRESSION: 1. Redemonstration of transmural thickening of the second and third portion of the duodenum suggestive of a duodenitis. There has been some interval decrease in thickening suggested however. 2. Unchanged right dominant cystic mass and with septations and calcifications noted of the right kidney measuring at up to 9.7 cm. 3. Simple cyst of the left kidney measuring 2.9 cm. 4. Stable retrocrural and para-aortic lymphadenopathy. Stable subcarinal and paraesophageal adenopathy. 5. Lytic lesions of L1, L4 both iliac bones are unchanged. 6. Uncomplicated cholelithiasis. 7. 1.1 cm circumscribed hypodense lesion of the right hepatic benign features on MRI. Electronically Signed   By: Ashley Royalty M.D.   On: 08/07/2018 01:57   Dg Abd Acute W/chest  Result Date: 08/06/2018 CLINICAL DATA:  Abdominal pain EXAM: DG ABDOMEN ACUTE W/ 1V CHEST COMPARISON:  None. FINDINGS: Port in the anterior chest wall with tip in distal SVC. Normal mediastinum and cardiac silhouette. Normal pulmonary vasculature. No evidence of effusion, infiltrate, or pneumothorax. No acute bony abnormality. Moderate volume stool in the ascending colon. Gallstones noted. No dilated large or small bowel. Large RIGHT renal mass in the RIGHT lower quadrant not well appreciated. IMPRESSION: 1. No acute findings in the chest or abdomen. 2. Moderate volume stool in the ascending colon. 3. Large 10 cm RIGHT renal mass demonstrated on CT 08/01/2018 not appreciated. Electronically Signed   By: Suzy Bouchard M.D.   On: 08/06/2018 08:26    Medications:  Scheduled: . docusate sodium  100  mg Oral BID  . insulin aspart  0-9 Units Subcutaneous Q4H  . metoCLOPramide  10 mg Oral QID  . pantoprazole  40 mg Oral BID  . polyethylene glycol  17 g Oral TID  . rivaroxaban  20 mg Oral Q supper   Continuous: . ciprofloxacin 400 mg (08/08/18 7408)  . dextrose 5 % and 0.9% NaCl 150 mL/hr at 08/08/18 0508  . metronidazole 500 mg (08/08/18 0510)    Assessment/Plan: 1) Duodenal ulcerations. 2) Constipation. 3) Metastatic RCC.   The patient reports that he requires pain medication every two hours.  He is stable.  Constipation is a problem and he is currently only on QD Miralax and docusate.  Plan: 1) Increase Miralax to TID. 2) Wean down on pain meds as tolerated. 3) Maintain strict NPO.  LOS: 1 day   Pamalee Marcoe D 08/08/2018, 7:05 AM

## 2018-08-08 NOTE — Progress Notes (Signed)
[facility] PROGRESS NOTE    Progress Note  Alexander Fry  FBP:[id number] DOB: 08/11/1961 DOA: 08/06/2018 PCP: [name], PA-C     Brief Narrative:   Alexander Fry is an 57 y.o. male past medical history of metastatic renal cell carcinoma, with a right IJ thrombus, with a recent admission for abdominal pain which was found to have duodenitis had an EGD during this time comes in for abdominal pain radiating to her back with some nausea and vomiting was found to have a pancreatitis  Assessment/Plan:   Acute pancreatitis: Patient n.p.o. continue IV fluids and IV narcotics. Abdomen and pelvis done on 08/06/2018 showed improving duodenitis. GI was consulted recommended conservative management. Surgery was consulted recommended no surgical intervention. He relates he continues to have significant pain  History of duodenitis: Continue IV Cipro and Flagyl continue Protonix.  Abnormal LFTs: As elevation in alkaline phosphatase AST and ALT. LFTs to have the morning are pending, GGT is pending.  Hypokalemia: Repleted now improved.  Normocytic anemia: Hemoglobin has remained stable.  Diabetes mellitus type 2 Continue sliding scale insulin.  Constipation MIralax once able to take orals  Metastatic renal cell carcinoma: Follow-up with oncology as an outpatient. The elevation in alkaline phosphatase is probably due to his metastatic disease to the bone.  DVT prophylaxis: lovenox Family Communication:None Disposition Plan/Barrier to D/C: unable to determine Code Status:     Code Status Orders  (From admission, onward)         Start     Ordered   08/07/18 0227  Full code  Continuous     08/07/18 0227        Code Status History    Date Active Date Inactive Code Status Order ID Comments User Context   08/01/2018 1500 08/04/2018 1719 Full Code [id number]  [name], MD ED   04/21/2017 1741 04/23/2017 1218 Full Code [id number]  [name], MD Inpatient         IV Access:    Peripheral IV   Procedures and diagnostic studies:   Ct Abdomen Pelvis W Contrast  Result Date: 08/07/2018 CLINICAL DATA:  Generalized abdominal pain and bloating with constipation. One episode of emesis after discharge. EXAM: CT ABDOMEN AND PELVIS WITH CONTRAST TECHNIQUE: Multidetector CT imaging of the abdomen and pelvis was performed using the standard protocol following bolus administration of intravenous contrast. CONTRAST:  141mL ISOVUE-300 IOPAMIDOL (ISOVUE-300) INJECTION 61%, 69mL OMNIPAQUE IOHEXOL 300 MG/ML SOLN COMPARISON:  08/01/2018 CT, MRI 08/03/2018 of the liver FINDINGS: Lower chest: Redemonstration of subcarinal adenopathy measuring up to 2.1 cm in short axis. The tip of a port catheter is seen in the SVC-RA juncture. Stable low-density paraesophageal lymph node with central necrosis measuring 2.2 cm short axis is stable. Slight increase in small right effusion. Hepatobiliary: Stable perihepatic ascites with periportal edema. Nondistended gallbladder with gallstones present. No significant extrahepatic biliary dilatation. Patent main portal veins. Small 1.1 cm right hepatic hypodensity consistent with a benign finding by recent MRI, series 2/14. Pancreas: No pancreatic mass or ductal dilatation. Spleen: Normal size spleen without mass. Adrenals/Urinary Tract: Normal right adrenal gland. Tiny hypodense nodule measuring 6 mm in the left adrenal apex. Again noted is a large hypodense 9.7 x 8.8 cm (series 2/46) heterogeneous mass arising from the mid to lower pole of the right kidney with septations, hypoattenuation and calcifications unchanged from recent comparison. Simple cyst arising from the upper pole the left kidney is unchanged measuring 2.9 cm. No nephrolithiasis. The urinary bladder is decompressed slightly thick-walled  as a result. No focal mural thickening or calculus. Stomach/Bowel: Contrast distended stomach without focal mural thickening or gastric fold  thickening. Thickened second and third portion of the duodenum as before suspicious for duodenitis. The jejunum is nondistended and noninflamed. The distal and terminal ileum are unremarkable. The colon is stool-filled without obstruction or inflammation. The appendix is not confidently identified. Vascular/Lymphatic: Nonaneurysmal aorta. Patent branch vessels. Stable 2 cm left para-aortic lymphadenopathy, series 2/44. Additional retroperitoneal adenopathy is stable. Retrocrural adenopathy is also noted, the largest is on the right measuring 1.1 cm short axis and also stable, series 2/21. Reproductive: Small prostate Other: Small volume perihepatic, perisplenic and pelvic ascites. Musculoskeletal: Lytic bone lesions of L1 and L4 with degenerative disc disease L5-S1 is redemonstrated. Lytic lesions of the iliac bones remain stable as well left measuring 10 mm and on the right 21 mm. IMPRESSION: 1. Redemonstration of transmural thickening of the second and third portion of the duodenum suggestive of a duodenitis. There has been some interval decrease in thickening suggested however. 2. Unchanged right dominant cystic mass and with septations and calcifications noted of the right kidney measuring at up to 9.7 cm. 3. Simple cyst of the left kidney measuring 2.9 cm. 4. Stable retrocrural and para-aortic lymphadenopathy. Stable subcarinal and paraesophageal adenopathy. 5. Lytic lesions of L1, L4 both iliac bones are unchanged. 6. Uncomplicated cholelithiasis. 7. 1.1 cm circumscribed hypodense lesion of the right hepatic benign features on MRI. Electronically Signed   By: Ashley Royalty M.D.   On: 08/07/2018 01:57     Medical Consultants:    None.  Anti-Infectives:   Cipro and flagyl  Subjective:    Alexander Fry he relates he continues to have abdominal pain, but the pain medication is working.  Objective:    Vitals:   08/07/18 0352 08/07/18 1415 08/07/18 2111 08/08/18 0436  BP: (!) 140/95 117/77 (!)  150/93 118/81  Pulse: 96 79 85 74  Resp: 20 20 12 12   Temp: 98.3 F (36.8 C) 98.5 F (36.9 C) 98.9 F (37.2 C) (!) 97.5 F (36.4 C)  TempSrc: Oral Oral Oral Oral  SpO2: 100% 91% 99% 93%  Weight:    95 kg  Height:        Intake/Output Summary (Last 24 hours) at 08/08/2018 0930 Last data filed at 08/08/2018 0424 Gross per 24 hour  Intake 1253.8 ml  Output 150 ml  Net 1103.8 ml   Filed Weights   08/06/18 2255 08/08/18 0436  Weight: 97.5 kg 95 kg    Exam: General exam: In no acute distress. Respiratory system: Good air movement and clear to auscultation. Cardiovascular system: S1 & S2 heard, RRR. No JVD. Gastrointestinal system: Abdomen is nondistended, soft and nontender.  Central nervous system: Alert and oriented. No focal neurological deficits. Extremities: No pedal edema. Skin: No rashes, lesions or ulcers Psychiatry: Judgement and insight appear normal. Mood & affect appropriate.    Data Reviewed:    Labs: Basic Metabolic Panel: Recent Labs  Lab 08/02/18 0535 08/03/18 0429 08/04/18 0436 08/06/18 2343 08/07/18 0448  NA 139 139 140 139 139  K 4.2 4.5 4.2 3.2* 3.7  CL 105 105 104 101 102  CO2 26 25 27 26 29   GLUCOSE 114* 237* 173* 105* 118*  BUN 5* 8 9 7  5*  CREATININE 0.41* 0.42* 0.57* 0.40* 0.42*  CALCIUM 8.7* 9.2 8.9 9.3 8.6*   GFR Estimated Creatinine Clearance: 120.4 mL/min (A) (by C-G formula based on SCr of 0.42 mg/dL (L)). Liver Function  Tests: Recent Labs  Lab 08/01/18 1150 08/03/18 0429 08/04/18 0436 08/06/18 2343 08/07/18 0448  AST 187* 144* 90* 182* 161*  ALT 183* 164* 143* 204* 177*  ALKPHOS 802* 727* 630* 875* 749*  BILITOT 5.8* 4.8* 1.9* 6.8* 6.1*  PROT 7.1 6.0* 5.7* 6.7 5.8*  ALBUMIN 3.2* 2.5* 2.4* 3.2* 2.7*   Recent Labs  Lab 08/01/18 1150 08/06/18 2343 08/07/18 0448  LIPASE 121* 969* 572*   No results for input(s): AMMONIA in the last 168 hours. Coagulation profile No results for input(s): INR, PROTIME in the last 168  hours.  CBC: Recent Labs  Lab 08/02/18 0535 08/03/18 0429 08/04/18 0436 08/06/18 2343 08/07/18 0448  WBC 5.4 4.3 11.7* 6.5 6.0  NEUTROABS  --   --  10.6* 5.2  --   HGB 11.9* 11.5* 10.9* 12.4* 12.0*  HCT 37.9* 35.9* 34.8* 38.4* 37.4*  MCV 76.3* 75.4* 76.8* 75.7* 76.3*  PLT 217 170 212 248 224   Cardiac Enzymes: No results for input(s): CKTOTAL, CKMB, CKMBINDEX, TROPONINI in the last 168 hours. BNP (last 3 results) No results for input(s): PROBNP in the last 8760 hours. CBG: Recent Labs  Lab 08/07/18 1746 08/07/18 2109 08/07/18 2356 08/08/18 0438 08/08/18 0731  GLUCAP 95 73 102* 119* 127*   D-Dimer: No results for input(s): DDIMER in the last 72 hours. Hgb A1c: No results for input(s): HGBA1C in the last 72 hours. Lipid Profile: Recent Labs    08/07/18 0448  CHOL 222*  HDL 57  LDLCALC 150*  TRIG 73  CHOLHDL 3.9   Thyroid function studies: No results for input(s): TSH, T4TOTAL, T3FREE, THYROIDAB in the last 72 hours.  Invalid input(s): FREET3 Anemia work up: No results for input(s): VITAMINB12, FOLATE, FERRITIN, TIBC, IRON, RETICCTPCT in the last 72 hours. Sepsis Labs: Recent Labs  Lab 08/03/18 0429 08/04/18 0436 08/06/18 2343 08/07/18 0448  WBC 4.3 11.7* 6.5 6.0   Microbiology No results found for this or any previous visit (from the past 240 hour(s)).   Medications:   . docusate sodium  100 mg Oral BID  . insulin aspart  0-9 Units Subcutaneous Q4H  . metoCLOPramide  10 mg Oral QID  . pantoprazole  40 mg Oral BID  . polyethylene glycol  17 g Oral TID  . rivaroxaban  20 mg Oral Q supper   Continuous Infusions: . ciprofloxacin 400 mg (08/08/18 7782)  . dextrose 5 % and 0.9% NaCl 150 mL/hr at 08/08/18 0508  . metronidazole 500 mg (08/08/18 0510)    LOS: 1 day   Charlynne Cousins  [facility] Pager [phone]  *Please refer to [email or web address], password [email or web address] to get updated schedule on who will round on this patient, as hospitalists switch  teams weekly. If 7PM-7AM, please contact night-coverage at [email or web address], password [email or web address] for any overnight needs.  08/08/2018, 9:30 AM

## 2018-08-09 ENCOUNTER — Inpatient Hospital Stay (HOSPITAL_COMMUNITY): Payer: BLUE CROSS/BLUE SHIELD

## 2018-08-09 DIAGNOSIS — I82C21 Chronic embolism and thrombosis of right internal jugular vein: Secondary | ICD-10-CM

## 2018-08-09 LAB — COMPREHENSIVE METABOLIC PANEL
ALBUMIN: 2.4 g/dL — AB (ref 3.5–5.0)
ALT: 167 U/L — ABNORMAL HIGH (ref 0–44)
AST: 173 U/L — AB (ref 15–41)
Alkaline Phosphatase: 876 U/L — ABNORMAL HIGH (ref 38–126)
Anion gap: 10 (ref 5–15)
CHLORIDE: 101 mmol/L (ref 98–111)
CO2: 27 mmol/L (ref 22–32)
Calcium: 8.5 mg/dL — ABNORMAL LOW (ref 8.9–10.3)
Creatinine, Ser: 0.34 mg/dL — ABNORMAL LOW (ref 0.61–1.24)
GFR calc Af Amer: >60 mL/min (ref >60)
Glucose, Bld: 129 mg/dL — ABNORMAL HIGH (ref 70–99)
POTASSIUM: 3.2 mmol/L — AB (ref 3.5–5.1)
SODIUM: 138 mmol/L (ref 135–145)
Total Bilirubin: 8.5 mg/dL — ABNORMAL HIGH (ref 0.3–1.2)
Total Protein: 5.7 g/dL — ABNORMAL LOW (ref 6.5–8.1)

## 2018-08-09 LAB — GLUCOSE, CAPILLARY
GLUCOSE-CAPILLARY: 118 mg/dL — AB (ref 70–99)
GLUCOSE-CAPILLARY: 121 mg/dL — AB (ref 70–99)
Glucose-Capillary: 105 mg/dL — ABNORMAL HIGH (ref 70–99)
Glucose-Capillary: 109 mg/dL — ABNORMAL HIGH (ref 70–99)
Glucose-Capillary: 116 mg/dL — ABNORMAL HIGH (ref 70–99)

## 2018-08-09 LAB — LIPASE, BLOOD
LIPASE: 185 U/L — AB (ref 11–51)
Lipase: 180 U/L — ABNORMAL HIGH (ref 11–51)

## 2018-08-09 LAB — MAGNESIUM: MAGNESIUM: 2 mg/dL (ref 1.7–2.4)

## 2018-08-09 MED ORDER — FLEET ENEMA 7-19 GM/118ML RE ENEM
1.0000 | ENEMA | Freq: Once | RECTAL | Status: AC
  Administered 2018-08-09: 1 via RECTAL
  Filled 2018-08-09 (×3): qty 1

## 2018-08-09 MED ORDER — POLYETHYLENE GLYCOL 3350 17 G PO PACK
34.0000 g | PACK | Freq: Three times a day (TID) | ORAL | Status: DC
  Administered 2018-08-09: 34 g via ORAL
  Administered 2018-08-10: 17 g via ORAL
  Administered 2018-08-10 – 2018-08-17 (×13): 34 g via ORAL
  Filled 2018-08-09 (×18): qty 2

## 2018-08-09 MED ORDER — NALOXEGOL OXALATE 25 MG PO TABS
25.0000 mg | ORAL_TABLET | Freq: Every day | ORAL | Status: DC
  Administered 2018-08-09 – 2018-08-28 (×20): 25 mg via ORAL
  Filled 2018-08-09 (×21): qty 1

## 2018-08-09 MED ORDER — POTASSIUM CHLORIDE 10 MEQ/100ML IV SOLN
10.0000 meq | INTRAVENOUS | Status: AC
  Administered 2018-08-09 (×4): 10 meq via INTRAVENOUS
  Filled 2018-08-09 (×4): qty 100

## 2018-08-09 MED ORDER — OXYCODONE HCL 5 MG PO TABS
5.0000 mg | ORAL_TABLET | ORAL | Status: DC | PRN
  Administered 2018-08-09 – 2018-08-12 (×8): 5 mg via ORAL
  Filled 2018-08-09 (×8): qty 1

## 2018-08-09 MED ORDER — LINACLOTIDE 145 MCG PO CAPS
145.0000 ug | ORAL_CAPSULE | Freq: Every day | ORAL | Status: DC
  Administered 2018-08-10 – 2018-08-21 (×12): 145 ug via ORAL
  Filled 2018-08-09 (×14): qty 1

## 2018-08-09 MED ORDER — SODIUM CHLORIDE 0.9 % IV SOLN
INTRAVENOUS | Status: DC | PRN
  Administered 2018-08-09 – 2018-08-27 (×7): 250 mL via INTRAVENOUS

## 2018-08-09 NOTE — Progress Notes (Signed)
PROGRESS NOTE    Deon Duer  HUT:[id number] DOB: 01-22-1961 DOA: 08/06/2018 PCP: [name], PA-C   Brief Narrative:  HPI On 08/07/2018 by Dr. [name] Mal Asher  is a 57 y.o. male, w hx of metastatic renal cell carcinoma (bone, brain, lung), h/o R IJ thrombus, Iron deficiency anemia,  w recent admission for abdominal pain where he was found to have duodenitis.  Pt apparently went home and continued to have abdominal pain which was worse yesterday associated with some n/v.  Pt denies fever, chills, diarrhea, brbpr, black stool.  Pt has been taking protonix.  Denies NSAIDS.   Interim history Admitted for acute pancreatitis.  Gastroenterology consulted and appreciated.  Continues to have abdominal pain and is requiring both IV as well as oral pain medications quite frequently.  Assessment & Plan   Acute pancreatitis -CT abdomen pelvis shows redemonstration of transmural thickening of the second and third portion of the duodenum suggestive of duodenitis, some interval decrease. -Patient continues to need both IV Dilaudid and oral oxycodone frequently. -Triglycerides 73; lipase on admission 969 -Gastroenterology consulted and appreciated recommended conservative management -General surgery consulted and appreciated, felt patient is a poor surgical candidate -Discussed with Dr. Benson Norway, gastroenterology, recommended obtaining KUB, wonders if some of patient's abdominal pain could be narcotic related constipation.  Recommended trying Linzess as well as Fleet enema. -Continue to monitor lipase level -Continue IV fluids and n.p.o. status at this time  Constipation -Though patient has not had any solid intake in many days, suspect medication induced.  Few bowel sounds noted on exam. -Abdominal x-ray on 08/06/2018 showed moderate volume of stool in the ascending colon -Discussed with Dr. Benson Norway, KUB, will incessantly enema ordered -Continue MiraLAX 3 times daily  Duodenitis -Currently on  Cipro and Flagyl, Protonix -CT abdomen pelvis as above  Abnormal LFTs -Trending downward very slowly -Hepatitis panel unremarkable  Hypokalemia -Potassium 3.2, will check magnesium and replace potassium  Diabetes mellitus, type II -Continue insulin sliding scale with CBG monitoring  Metastatic renal cell carcinoma -Follow-up with oncology as an outpatient -Possibly elevation in alk phos due to metastatic bone disease  Chronic normocytic anemia -Appears to be stable  Right IJ thrombus -Continue Xarelto  DVT Prophylaxis  xarelto  Code Status: Full  Family Communication: None at bedside  Disposition Plan: Admitted.  She continues to need admission given his pain has been uncontrolled with both IV and oral narcotics.  Patient continues to receive these medications frequently.  Consultants Gastroenterology General surgery  Procedures  none  Antibiotics   Anti-infectives (From admission, onward)   Start     Dose/Rate Route Frequency Ordered Stop   08/07/18 1800  ciprofloxacin (CIPRO) IVPB 400 mg     400 mg 200 mL/hr over 60 Minutes Intravenous Every 12 hours 08/07/18 0321     08/07/18 0300  metroNIDAZOLE (FLAGYL) IVPB 500 mg     500 mg 100 mL/hr over 60 Minutes Intravenous Every 8 hours 08/07/18 0253     08/07/18 0300  ciprofloxacin (CIPRO) IVPB 400 mg     400 mg 200 mL/hr over 60 Minutes Intravenous  Once 08/07/18 0258 08/07/18 0415      Subjective:   Berkley Cronkright seen and examined today.  Continues to have abdominal soreness as well as distention.  States he had gas passage approximately 6 PM last night.  Denies current chest pain, shortness of breath, dizziness or headache.  Denies current nausea however did receive Zofran.  Feels oxycodone does not last long  enough.  Objective:   Vitals:   08/08/18 0436 08/08/18 1237 08/08/18 2057 08/09/18 0412  BP: 118/81 129/79 (!) 142/81 135/78  Pulse: 74 76 79 84  Resp: _0 Temp: (!) 97.5 F (36.4 C) 98.7 F  (37.1 C) 98.8 F (37.1 C) 98.6 F (37 C)  TempSrc: Oral Oral Oral Oral  SpO2: 93% 98% 93% 95%  Weight: 95 kg   95.9 kg  Height:        Intake/Output Summary (Last 24 hours) at 08/09/2018 1107 Last data filed at 08/09/2018 0233 Gross per 24 hour  Intake 1856.91 ml  Output -  Net 1856.91 ml   Filed Weights   08/06/18 2255 08/08/18 0436 08/09/18 0412  Weight: 97.5 kg 95 kg 95.9 kg    Exam  General: Well developed, well nourished, NAD, appears stated age  43: NCAT, mucous membranes moist.   Neck: Supple  Cardiovascular: S1 S2 auscultated, no rubs, murmurs or gallops. Regular rate and rhythm.  Respiratory: Clear to auscultation bilaterally with equal chest rise  Abdomen: Soft, Diffusely TTP, mildly distended, +hypoactive bowel sounds  Extremities: warm dry without cyanosis clubbing or edema  Neuro: AAOx3, nonfocal   Skin: Without rashes exudates or nodules  Psych: Normal affect and demeanor with intact judgement and insight   Data Reviewed: I have personally reviewed following labs and imaging studies  CBC: Recent Labs  Lab 08/03/18 0429 08/04/18 0436 08/06/18 2343 08/07/18 0448  WBC 4.3 11.7* 6.5 6.0  NEUTROABS  --  10.6* 5.2  --   HGB 11.5* 10.9* 12.4* 12.0*  HCT 35.9* 34.8* 38.4* 37.4*  MCV 75.4* 76.8* 75.7* 76.3*  PLT 170 212 248 469   Basic Metabolic Panel: Recent Labs  Lab 08/04/18 0436 08/06/18 2343 08/07/18 0448 08/08/18 1205 08/09/18 0352  NA 140 139 139 140 138  K 4.2 3.2* 3.7 3.5 3.2*  CL 104 101 102 102 101  CO2 _1 GLUCOSE 173* 105* 118* 127* 129*  BUN 9 7 5* 5* <5*  CREATININE 0.57* 0.40* 0.42* 0.44* 0.34*  CALCIUM 8.9 9.3 8.6* 8.6* 8.5*   GFR: Estimated Creatinine Clearance: 120.9 mL/min (A) (by C-G formula based on SCr of 0.34 mg/dL (L)). Liver Function Tests: Recent Labs  Lab 08/04/18 0436 08/06/18 2343 08/07/18 0448 08/08/18 1205 08/09/18 0352  AST 90* 182* 161* 198* 173*  ALT 143* 204* 177* 188* 167*    ALKPHOS 630* 875* 749* 917* 876*  BILITOT 1.9* 6.8* 6.1* 7.9* 8.5*  PROT 5.7* 6.7 5.8* 5.9* 5.7*  ALBUMIN 2.4* 3.2* 2.7* 2.6* 2.4*   Recent Labs  Lab 08/06/18 2343 08/07/18 0448  LIPASE 969* 572*   No results for input(s): AMMONIA in the last 168 hours. Coagulation Profile: No results for input(s): INR, PROTIME in the last 168 hours. Cardiac Enzymes: No results for input(s): CKTOTAL, CKMB, CKMBINDEX, TROPONINI in the last 168 hours. BNP (last 3 results) No results for input(s): PROBNP in the last 8760 hours. HbA1C: No results for input(s): HGBA1C in the last 72 hours. CBG: Recent Labs  Lab 08/08/18 1631 08/08/18 1957 08/08/18 2322 08/09/18 0409 08/09/18 0738  GLUCAP 109* 113* 96 118* 121*   Lipid Profile: Recent Labs    08/07/18 0448  CHOL 222*  HDL 57  LDLCALC 150*  TRIG 73  CHOLHDL 3.9   Thyroid Function Tests: No results for input(s): TSH, T4TOTAL, FREET4, T3FREE, THYROIDAB in the last 72 hours. Anemia Panel: No results for input(s): VITAMINB12, FOLATE, FERRITIN,  TIBC, IRON, RETICCTPCT in the last 72 hours. Urine analysis:    Component Value Date/Time   COLORURINE AMBER (A) 08/01/2018 1107   APPEARANCEUR CLEAR 08/01/2018 1107   LABSPEC 1.021 08/01/2018 1107   PHURINE 5.0 08/01/2018 1107   GLUCOSEU 50 (A) 08/01/2018 1107   HGBUR SMALL (A) 08/01/2018 1107   BILIRUBINUR MODERATE (A) 08/01/2018 1107   KETONESUR 80 (A) 08/01/2018 1107   PROTEINUR 30 (A) 08/01/2018 1107   NITRITE NEGATIVE 08/01/2018 1107   LEUKOCYTESUR SMALL (A) 08/01/2018 1107   Sepsis Labs: _0 (procalcitonin:4,lacticidven:4)  )No results found for this or any previous visit (from the past 240 hour(s)).    Radiology Studies: No results found.   Scheduled Meds: . docusate sodium  100 mg Oral BID  . insulin aspart  0-9 Units Subcutaneous Q4H  . metoCLOPramide  10 mg Oral QID  . pantoprazole  40 mg Oral BID  . polyethylene glycol  17 g Oral TID  . rivaroxaban  20 mg Oral  Q supper   Continuous Infusions: . sodium chloride 250 mL (08/09/18 1030)  . ciprofloxacin 400 mg (08/09/18 0629)  . dextrose 5 % and 0.9% NaCl 75 mL/hr at 08/08/18 1020  . metronidazole 500 mg (08/09/18 0507)  . potassium chloride 10 mEq (08/09/18 1032)     LOS: 2 days   Time Spent in minutes   45 minutes (greater than 50% of time spent with patient face to face, as well as reviewing records, calling consults, and formulating a plan)   Cristal Ford D.O. on 08/09/2018 at 11:07 AM  Between 7am to 7pm - Please see pager noted on [email or web address]  After 7pm go to [email or web address]  And look for the night coverage person covering for me after hours  [facility] Office  [phone]

## 2018-08-10 ENCOUNTER — Encounter (HOSPITAL_COMMUNITY): Payer: Self-pay | Admitting: Radiology

## 2018-08-10 ENCOUNTER — Inpatient Hospital Stay (HOSPITAL_COMMUNITY): Payer: BLUE CROSS/BLUE SHIELD

## 2018-08-10 LAB — CBC
HCT: 34.1 % — ABNORMAL LOW (ref 39.0–52.0)
Hemoglobin: 10.9 g/dL — ABNORMAL LOW (ref 13.0–17.0)
MCH: 24.4 pg — AB (ref 26.0–34.0)
MCHC: 32 g/dL (ref 30.0–36.0)
MCV: 76.5 fL — AB (ref 78.0–100.0)
Platelets: 262 10*3/uL (ref 150–400)
RBC: 4.46 MIL/uL (ref 4.22–5.81)
RDW: 19.1 % — AB (ref 11.5–15.5)
WBC: 6.6 10*3/uL (ref 4.0–10.5)

## 2018-08-10 LAB — COMPREHENSIVE METABOLIC PANEL
ALBUMIN: 2.4 g/dL — AB (ref 3.5–5.0)
ALT: 146 U/L — AB (ref 0–44)
AST: 142 U/L — AB (ref 15–41)
Alkaline Phosphatase: 878 U/L — ABNORMAL HIGH (ref 38–126)
Anion gap: 10 (ref 5–15)
CHLORIDE: 101 mmol/L (ref 98–111)
CO2: 27 mmol/L (ref 22–32)
CREATININE: 0.34 mg/dL — AB (ref 0.61–1.24)
Calcium: 8.5 mg/dL — ABNORMAL LOW (ref 8.9–10.3)
GFR calc non Af Amer: >60 mL/min (ref >60)
Glucose, Bld: 149 mg/dL — ABNORMAL HIGH (ref 70–99)
Potassium: 3.1 mmol/L — ABNORMAL LOW (ref 3.5–5.1)
SODIUM: 138 mmol/L (ref 135–145)
Total Bilirubin: 9.6 mg/dL — ABNORMAL HIGH (ref 0.3–1.2)
Total Protein: 5.6 g/dL — ABNORMAL LOW (ref 6.5–8.1)

## 2018-08-10 LAB — GLUCOSE, CAPILLARY
GLUCOSE-CAPILLARY: 109 mg/dL — AB (ref 70–99)
GLUCOSE-CAPILLARY: 131 mg/dL — AB (ref 70–99)
GLUCOSE-CAPILLARY: 112 mg/dL — AB (ref 70–99)
Glucose-Capillary: 109 mg/dL — ABNORMAL HIGH (ref 70–99)
Glucose-Capillary: 111 mg/dL — ABNORMAL HIGH (ref 70–99)
Glucose-Capillary: 121 mg/dL — ABNORMAL HIGH (ref 70–99)
Glucose-Capillary: 131 mg/dL — ABNORMAL HIGH (ref 70–99)

## 2018-08-10 MED ORDER — IOHEXOL 300 MG/ML  SOLN
100.0000 mL | Freq: Once | INTRAMUSCULAR | Status: AC | PRN
  Administered 2018-08-10: 100 mL via INTRAVENOUS

## 2018-08-10 MED ORDER — POTASSIUM CHLORIDE CRYS ER 20 MEQ PO TBCR
40.0000 meq | EXTENDED_RELEASE_TABLET | Freq: Once | ORAL | Status: DC
  Filled 2018-08-10: qty 2

## 2018-08-10 MED ORDER — SUCRALFATE 1 G PO TABS
1.0000 g | ORAL_TABLET | Freq: Three times a day (TID) | ORAL | Status: DC
  Administered 2018-08-10 – 2018-08-24 (×53): 1 g via ORAL
  Filled 2018-08-10 (×56): qty 1

## 2018-08-10 MED ORDER — HYDROMORPHONE HCL 1 MG/ML IJ SOLN
1.0000 mg | Freq: Once | INTRAMUSCULAR | Status: AC
  Administered 2018-08-10: 1 mg via INTRAVENOUS
  Filled 2018-08-10: qty 1

## 2018-08-10 MED ORDER — POTASSIUM CHLORIDE 10 MEQ/100ML IV SOLN
10.0000 meq | INTRAVENOUS | Status: AC
  Administered 2018-08-10 (×4): 10 meq via INTRAVENOUS
  Filled 2018-08-10 (×4): qty 100

## 2018-08-10 NOTE — Progress Notes (Signed)
Subjective: No change in the intensity of the abdominal pain.  He still required q2 hour dilaudid.  Objective: Vital signs in last 24 hours: Temp:  [98.7 F (37.1 C)-99 F (37.2 C)] 98.9 F (37.2 C) (09/05 1317) Pulse Rate:  [81-91] 83 (09/05 1317) Resp:  [16-22] 22 (09/05 1317) BP: (133-155)/(84-86) 133/84 (09/05 1317) SpO2:  [96 %-98 %] 96 % (09/05 1317) Weight:  [97.3 kg] 97.3 kg (09/05 0403) Last BM Date: 08/09/18  Intake/Output from previous day: 09/04 0701 - 09/05 0700 In: 2137 [I.V.:1610.6; IV Piggyback:526.4] Out: -  Intake/Output this shift: No intake/output data recorded.  General appearance: alert and no distress GI: mild upper abdominal pain at this time  Lab Results: Recent Labs    08/10/18 0420  WBC 6.6  HGB 10.9*  HCT 34.1*  PLT 262   BMET Recent Labs    08/08/18 1205 08/09/18 0352 08/10/18 0420  NA 140 138 138  K 3.5 3.2* 3.1*  CL 102 101 101  CO2 26 27 27   GLUCOSE 127* 129* 149*  BUN 5* <5* <5*  CREATININE 0.44* 0.34* 0.34*  CALCIUM 8.6* 8.5* 8.5*   LFT Recent Labs    08/10/18 0420  PROT 5.6*  ALBUMIN 2.4*  AST 142*  ALT 146*  ALKPHOS 878*  BILITOT 9.6*   PT/INR No results for input(s): LABPROT, INR in the last 72 hours. Hepatitis Panel No results for input(s): HEPBSAG, HCVAB, HEPAIGM, HEPBIGM in the last 72 hours. C-Diff No results for input(s): CDIFFTOX in the last 72 hours. Fecal Lactopherrin No results for input(s): FECLLACTOFRN in the last 72 hours.  Studies/Results: Dg Abd 1 View  Result Date: 08/09/2018 CLINICAL DATA:  Renal cell carcinoma with abdominal pain EXAM: ABDOMEN - 1 VIEW COMPARISON:  CT abdomen and pelvis August 07, 2018 FINDINGS: Contrast is seen in the colon. There is no bowel dilatation or air-fluid level to suggest bowel obstruction. No free air. Visualized lung bases are clear. No abnormal calcifications. IMPRESSION: No evident bowel obstruction or free air.  Lung bases clear. Electronically Signed   By:  Lowella Grip III M.D.   On: 08/09/2018 13:53   Ct Abdomen W Contrast  Result Date: 08/10/2018 CLINICAL DATA:  History metastatic renal cell carcinoma. Duodenitis. Abdominal pain. EXAM: CT ABDOMEN WITH CONTRAST TECHNIQUE: Multidetector CT imaging of the abdomen was performed using the standard protocol following bolus administration of intravenous contrast. CONTRAST:  143mL OMNIPAQUE IOHEXOL 300 MG/ML  SOLN COMPARISON:  CT abdomen pelvis 08/07/2018 FINDINGS: Lower chest: Normal heart size. Small layering bilateral pleural effusions. Dependent atelectasis within the bilateral lower lobes. Stable paraesophageal lymph node measuring 2.0 cm (image 8; series 2), previously 2.2 cm. Hepatobiliary: Liver is normal in size and contour. Multiple stones within the gallbladder lumen. No gallbladder wall thickening. Stable perihepatic ascites. Unchanged 11 mm low-attenuation lesion right hepatic lobe (image 16; series 2). Pancreas: Peripancreatic fluid.  Pancreatic parenchyma enhances. Spleen: Unremarkable Adrenals/Urinary Tract: Stable left adrenal gland nodularity. Right adrenal gland is normal. Unchanged complex mass within the inferior pole of the right kidney measuring 10.3 x 9.0 cm. Simple cyst superior pole left kidney. No hydronephrosis. Stomach/Bowel: Normal morphology of the stomach. Wall thickening of the second-third portions of the duodenum. No evidence for small bowel obstruction. No free intraperitoneal air. Vascular/Lymphatic: Normal caliber abdominal aorta. Peripheral calcified atherosclerotic plaque. Stable retroperitoneal adenopathy including a 1.6 cm aortocaval lymph node (image 49; series 2) and a 1.2 cm left periaortic lymph node (image 54; series 2). Other: Small volume ascites.  No free intraperitoneal air. Musculoskeletal: Similar-appearing lytic lesions of the L1 and L4 vertebral bodies. IMPRESSION: 1. Similar-appearing wall thickening of the second third portion of the duodenum raising the  possibility of duodenitis. 2. There is peripancreatic fluid which may be secondary to duodenitis and reactive fluid. Possibility of pancreatitis not excluded. 3. Similar-appearing heterogeneous solid and cystic mass inferior pole right kidney, most compatible with renal cell carcinoma. 4. Similar-appearing retroperitoneal and retrocrural adenopathy. 5. Stable lytic osseous lesions. 6. Small bilateral pleural effusions and underlying opacities favored to represent atelectasis. Electronically Signed   By: Lovey Newcomer M.D.   On: 08/10/2018 13:16    Medications:  Scheduled: . docusate sodium  100 mg Oral BID  . insulin aspart  0-9 Units Subcutaneous Q4H  . linaclotide  145 mcg Oral QAC breakfast  . metoCLOPramide  10 mg Oral QID  . naloxegol oxalate  25 mg Oral Daily  . pantoprazole  40 mg Oral BID  . polyethylene glycol  34 g Oral TID  . rivaroxaban  20 mg Oral Q supper  . sucralfate  1 g Oral TID WC & HS   Continuous: . sodium chloride 250 mL (08/10/18 1004)  . dextrose 5 % and 0.9% NaCl 75 mL/hr at 08/10/18 1353  . potassium chloride 10 mEq (08/10/18 1357)    Assessment/Plan: 1) Duodenitis/duodenal ulcers. 2) Metastatic renal cell carcinoma.   There is no clear evidence for pancreatitis.  Certainly, the repeat CT scan findings explain the severity of his abdominal pain with the current findings of the pancreas, I.e., some peripancreatic fluid.  There is a persistent duodenitis, which is most likely the source of his pain.  The etiology of this issue is felt to be from medication, but he has been off of Afinitor.    Plan: 1) Start sucralfate. 2) Continue with laxatives. 3) Minimize pain medications if possible. 4) Since there is no convincing evidence of pancreatitis, it may be worthwhile to restart him on a light diet to see if he can tolerate PO.  He will need nutrition at some point and it is better if it is oral.  LOS: 3 days   Samantha Olivera D 08/10/2018, 2:55 PM

## 2018-08-10 NOTE — Progress Notes (Signed)
PROGRESS NOTE    Alexander Fry  QVZ:[id number] DOB: March 09, 1961 DOA: 08/06/2018 PCP: [name], PA-C   Brief Narrative:  HPI On 08/07/2018 by Dr. [name] Fry Alleva  is a 57 y.o. male, w hx of metastatic renal cell carcinoma (bone, brain, lung), h/o R IJ thrombus, Iron deficiency anemia,  w recent admission for abdominal pain where he was found to have duodenitis.  Pt apparently went home and continued to have abdominal pain which was worse yesterday associated with some n/v.  Pt denies fever, chills, diarrhea, brbpr, black stool.  Pt has been taking protonix.  Denies NSAIDS.   Interim history Admitted for acute pancreatitis.  Gastroenterology consulted and appreciated.  Continues to have abdominal pain and is requiring both IV as well as oral pain medications quite frequently.  Assessment & Plan   Acute pancreatitis -CT abdomen pelvis shows redemonstration of transmural thickening of the second and third portion of the duodenum suggestive of duodenitis, some interval decrease. -Patient continues to need both IV Dilaudid and oral oxycodone frequently. -Triglycerides 73; lipase on admission 969-down to 180 -Gastroenterology consulted and appreciated recommended conservative management -General surgery consulted and appreciated, felt patient is a poor surgical candidate -Discussed with Dr. Benson Norway, gastroenterology, recommended obtaining KUB, wonders if some of patient's abdominal pain could be narcotic related constipation.  Recommended trying Linzess as well as Fleet enema- given on 9/4. -Continue IV fluids and n.p.o. status at this time -Will obtain repeat CT scan.  Constipation -Though patient has not had any solid intake in many days, suspect medication induced.  Few bowel sounds noted on exam. -Abdominal x-ray on 08/06/2018 showed moderate volume of stool in the ascending colon -Discussed with Dr. Benson Norway today. Patient was given an enema on 9/4, with minimal BM yesterday. Was  started on linzess. Dr. Benson Norway recommended repeat CT scan today.  -Continue MiraLAX 3 times daily, movantik -Encouraged patient to get out of bed and walk around -Discussed possibly using relastor or reglan with Dr. Benson Norway- felt Relastor and movantik were equal. No indication for reglan at this time and too many side effects.  Duodenitis -Completed course of Cipro and Flagyl -Continue Protonix -CT abdomen pelvis as above  Abnormal LFTs -Trending downward very slowly -Hepatitis panel unremarkable  Hypokalemia -Potassium 3.1- will continue to replace and monitor BMP -magnesium was checked, 2  Diabetes mellitus, type II -Continue insulin sliding scale with CBG monitoring  Metastatic renal cell carcinoma -Follow-up with oncology as an outpatient -Possibly elevation in alk phos due to metastatic bone disease  Chronic normocytic anemia -Appears to be stable  Right IJ thrombus -Continue Xarelto  DVT Prophylaxis  xarelto  Code Status: Full  Family Communication: None at bedside  Disposition Plan: Admitted.  He continues to need admission given his pain has been uncontrolled with both IV and oral narcotics.  Patient continues to receive these medications frequently. Pending further recommendations from GI.  Consultants Gastroenterology General surgery  Procedures  none  Antibiotics   Anti-infectives (From admission, onward)   Start     Dose/Rate Route Frequency Ordered Stop   08/07/18 1800  ciprofloxacin (CIPRO) IVPB 400 mg  Status:  Discontinued     400 mg 200 mL/hr over 60 Minutes Intravenous Every 12 hours 08/07/18 0321 08/09/18 1514   08/07/18 0300  metroNIDAZOLE (FLAGYL) IVPB 500 mg  Status:  Discontinued     500 mg 100 mL/hr over 60 Minutes Intravenous Every 8 hours 08/07/18 0253 08/09/18 1514   08/07/18 0300  ciprofloxacin (CIPRO)  IVPB 400 mg     400 mg 200 mL/hr over 60 Minutes Intravenous  Once 08/07/18 0258 08/07/18 0415      Subjective:   [name] seen  and examined today.  He is to have abdominal pain and bloating.  Thought it was the potassium that was given that was causing him to have bloating.  Feels any pills that he takes sit in his stomach and do not move very well.  Denies current nausea or vomiting.  Denies current chest pain or shortness of breath.  Was able to have a small bowel movement yesterday evening.  Continues to use IV pain medications, which seems to be the only thing that relieves his pain.  Patient feels the oxycodone does not work well.  Objective:   Vitals:   08/09/18 0412 08/09/18 1248 08/09/18 2021 08/10/18 0403  BP: 135/78 129/83 (!) 141/84 (!) 155/86  Pulse: 84 79 91 81  Resp: '12  18 16  ' Temp: 98.6 F (37 C) 98.6 F (37 C) 98.7 F (37.1 C) 99 F (37.2 C)  TempSrc: Oral Oral Oral Oral  SpO2: 95% 95% 97% 98%  Weight: 95.9 kg   97.3 kg  Height:        Intake/Output Summary (Last 24 hours) at 08/10/2018 1011 Last data filed at 08/10/2018 0200 Gross per 24 hour  Intake 2137.04 ml  Output -  Net 2137.04 ml   Filed Weights   08/08/18 0436 08/09/18 0412 08/10/18 0403  Weight: 95 kg 95.9 kg 97.3 kg   Exam  General: Well developed, well nourished, NAD, appears stated age  61: NCAT, mucous membranes moist.   Neck: Supple  Cardiovascular: S1 S2 auscultated, no murmurs, RRR  Respiratory: Clear to auscultation bilaterally with equal chest rise  Abdomen: Soft, mild diffusely TTP, distended, + bowel sounds  Extremities: warm dry without cyanosis clubbing or edema  Neuro: AAOx3, nonfocal  Psych: Normal affect and demeanor with intact judgement and insight  Data Reviewed: I have personally reviewed following labs and imaging studies  CBC: Recent Labs  Lab 08/04/18 0436 08/06/18 2343 08/07/18 0448 08/10/18 0420  WBC 11.7* 6.5 6.0 6.6  NEUTROABS 10.6* 5.2  --   --   HGB 10.9* 12.4* 12.0* 10.9*  HCT 34.8* 38.4* 37.4* 34.1*  MCV 76.8* 75.7* 76.3* 76.5*  PLT 212 248 224 631   Basic Metabolic  Panel: Recent Labs  Lab 08/06/18 2343 08/07/18 0448 08/08/18 1205 08/09/18 0352 08/09/18 1114 08/10/18 0420  NA 139 139 140 138  --  138  K 3.2* 3.7 3.5 3.2*  --  3.1*  CL 101 102 102 101  --  101  CO2 '26 29 26 27  ' --  27  GLUCOSE 105* 118* 127* 129*  --  149*  BUN 7 5* 5* <5*  --  <5*  CREATININE 0.40* 0.42* 0.44* 0.34*  --  0.34*  CALCIUM 9.3 8.6* 8.6* 8.5*  --  8.5*  MG  --   --   --   --  2.0  --    GFR: Estimated Creatinine Clearance: 121.7 mL/min (A) (by C-G formula based on SCr of 0.34 mg/dL (L)). Liver Function Tests: Recent Labs  Lab 08/06/18 2343 08/07/18 0448 08/08/18 1205 08/09/18 0352 08/10/18 0420  AST 182* 161* 198* 173* 142*  ALT 204* 177* 188* 167* 146*  ALKPHOS 875* 749* 917* 876* 878*  BILITOT 6.8* 6.1* 7.9* 8.5* 9.6*  PROT 6.7 5.8* 5.9* 5.7* 5.6*  ALBUMIN 3.2* 2.7* 2.6* 2.4*  2.4*   Recent Labs  Lab 08/06/18 2343 08/07/18 0448 08/09/18 1114 08/09/18 1257  LIPASE 969* 572* 185* 180*   No results for input(s): AMMONIA in the last 168 hours. Coagulation Profile: No results for input(s): INR, PROTIME in the last 168 hours. Cardiac Enzymes: No results for input(s): CKTOTAL, CKMB, CKMBINDEX, TROPONINI in the last 168 hours. BNP (last 3 results) No results for input(s): PROBNP in the last 8760 hours. HbA1C: No results for input(s): HGBA1C in the last 72 hours. CBG: Recent Labs  Lab 08/09/18 1717 08/09/18 2020 08/09/18 2336 08/10/18 0400 08/10/18 0819  GLUCAP 105* 109* 116* 131* 121*   Lipid Profile: No results for input(s): CHOL, HDL, LDLCALC, TRIG, CHOLHDL, LDLDIRECT in the last 72 hours. Thyroid Function Tests: No results for input(s): TSH, T4TOTAL, FREET4, T3FREE, THYROIDAB in the last 72 hours. Anemia Panel: No results for input(s): VITAMINB12, FOLATE, FERRITIN, TIBC, IRON, RETICCTPCT in the last 72 hours. Urine analysis:    Component Value Date/Time   COLORURINE AMBER (A) 08/01/2018 1107   APPEARANCEUR CLEAR 08/01/2018 1107    LABSPEC 1.021 08/01/2018 1107   PHURINE 5.0 08/01/2018 1107   GLUCOSEU 50 (A) 08/01/2018 1107   HGBUR SMALL (A) 08/01/2018 1107   BILIRUBINUR MODERATE (A) 08/01/2018 1107   KETONESUR 80 (A) 08/01/2018 1107   PROTEINUR 30 (A) 08/01/2018 1107   NITRITE NEGATIVE 08/01/2018 1107   LEUKOCYTESUR SMALL (A) 08/01/2018 1107   Sepsis Labs: '@LABRCNTIP' (procalcitonin:4,lacticidven:4)  )No results found for this or any previous visit (from the past 240 hour(s)).    Radiology Studies: Dg Abd 1 View  Result Date: 08/09/2018 CLINICAL DATA:  Renal cell carcinoma with abdominal pain EXAM: ABDOMEN - 1 VIEW COMPARISON:  CT abdomen and pelvis August 07, 2018 FINDINGS: Contrast is seen in the colon. There is no bowel dilatation or air-fluid level to suggest bowel obstruction. No free air. Visualized lung bases are clear. No abnormal calcifications. IMPRESSION: No evident bowel obstruction or free air.  Lung bases clear. Electronically Signed   By: Lowella Grip III M.D.   On: 08/09/2018 13:53     Scheduled Meds: . docusate sodium  100 mg Oral BID  . insulin aspart  0-9 Units Subcutaneous Q4H  . linaclotide  145 mcg Oral QAC breakfast  . metoCLOPramide  10 mg Oral QID  . naloxegol oxalate  25 mg Oral Daily  . pantoprazole  40 mg Oral BID  . polyethylene glycol  34 g Oral TID  . rivaroxaban  20 mg Oral Q supper   Continuous Infusions: . sodium chloride 250 mL (08/10/18 1004)  . dextrose 5 % and 0.9% NaCl 75 mL/hr at 08/10/18 0200  . potassium chloride 10 mEq (08/10/18 1005)     LOS: 3 days   Time Spent in minutes   45 minutes (greater than 50% of time spent with patient face to face, as well as reviewing records, calling consults, and formulating a plan)   Cristal Ford D.O. on 08/10/2018 at 10:11 AM  Between 7am to 7pm - Please see pager noted on [email or web address]  After 7pm go to [email or web address]  And look for the night coverage person covering for me after hours  [facility] Office  [phone]

## 2018-08-11 LAB — COMPREHENSIVE METABOLIC PANEL
ALK PHOS: 916 U/L — AB (ref 38–126)
ALT: 140 U/L — AB (ref 0–44)
AST: 157 U/L — ABNORMAL HIGH (ref 15–41)
Albumin: 2.2 g/dL — ABNORMAL LOW (ref 3.5–5.0)
Anion gap: 8 (ref 5–15)
BILIRUBIN TOTAL: 10.1 mg/dL — AB (ref 0.3–1.2)
BUN: <5 mg/dL — ABNORMAL LOW (ref 6–20)
CALCIUM: 8.3 mg/dL — AB (ref 8.9–10.3)
CO2: 29 mmol/L (ref 22–32)
CREATININE: 0.36 mg/dL — AB (ref 0.61–1.24)
Chloride: 102 mmol/L (ref 98–111)
Glucose, Bld: 117 mg/dL — ABNORMAL HIGH (ref 70–99)
Potassium: 3.1 mmol/L — ABNORMAL LOW (ref 3.5–5.1)
Sodium: 139 mmol/L (ref 135–145)
TOTAL PROTEIN: 5.2 g/dL — AB (ref 6.5–8.1)

## 2018-08-11 LAB — GLUCOSE, CAPILLARY
GLUCOSE-CAPILLARY: 99 mg/dL (ref 70–99)
GLUCOSE-CAPILLARY: 134 mg/dL — AB (ref 70–99)
Glucose-Capillary: 112 mg/dL — ABNORMAL HIGH (ref 70–99)
Glucose-Capillary: 125 mg/dL — ABNORMAL HIGH (ref 70–99)
Glucose-Capillary: 106 mg/dL — ABNORMAL HIGH (ref 70–99)

## 2018-08-11 LAB — MAGNESIUM: Magnesium: 1.9 mg/dL (ref 1.7–2.4)

## 2018-08-11 LAB — LIPASE, BLOOD: Lipase: 139 U/L — ABNORMAL HIGH (ref 11–51)

## 2018-08-11 MED ORDER — POTASSIUM CHLORIDE CRYS ER 10 MEQ PO TBCR
40.0000 meq | EXTENDED_RELEASE_TABLET | ORAL | Status: AC
  Administered 2018-08-11 (×3): 40 meq via ORAL
  Filled 2018-08-11 (×2): qty 4
  Filled 2018-08-11: qty 2

## 2018-08-11 MED ORDER — DICLOFENAC SODIUM 1 % TD GEL
2.0000 g | Freq: Four times a day (QID) | TRANSDERMAL | Status: DC
  Administered 2018-08-11 – 2018-08-21 (×7): 2 g via TOPICAL
  Filled 2018-08-11: qty 100

## 2018-08-11 NOTE — Progress Notes (Signed)
Subjective: He is still requiring Dilaudid every 2 hours.  Objective: Vital signs in last 24 hours: Temp:  [98.5 F (36.9 C)-99.1 F (37.3 C)] 98.5 F (36.9 C) (09/06 0359) Pulse Rate:  [83-89] 89 (09/06 0359) Resp:  [18-22] 18 (09/06 0359) BP: (131-133)/(75-92) 132/92 (09/06 0359) SpO2:  [96 %-98 %] 98 % (09/06 0359) Weight:  [97.7 kg] 97.7 kg (09/06 0353) Last BM Date: 08/10/18  Intake/Output from previous day: 09/05 0701 - 09/06 0700 In: 2108.3 [I.V.:2108.3] Out: -  Intake/Output this shift: No intake/output data recorded.  General appearance: alert and no distress GI: tender in the epigastric/periumbilical region  Lab Results: Recent Labs    08/10/18 0420  WBC 6.6  HGB 10.9*  HCT 34.1*  PLT 262   BMET Recent Labs    08/09/18 0352 08/10/18 0420 08/11/18 0337  NA 138 138 139  K 3.2* 3.1* 3.1*  CL 101 101 102  CO2 27 27 29   GLUCOSE 129* 149* 117*  BUN <5* <5* <5*  CREATININE 0.34* 0.34* 0.36*  CALCIUM 8.5* 8.5* 8.3*   LFT Recent Labs    08/11/18 0337  PROT 5.2*  ALBUMIN 2.2*  AST 157*  ALT 140*  ALKPHOS 916*  BILITOT 10.1*   PT/INR No results for input(s): LABPROT, INR in the last 72 hours. Hepatitis Panel No results for input(s): HEPBSAG, HCVAB, HEPAIGM, HEPBIGM in the last 72 hours. C-Diff No results for input(s): CDIFFTOX in the last 72 hours. Fecal Lactopherrin No results for input(s): FECLLACTOFRN in the last 72 hours.  Studies/Results: Dg Abd 1 View  Result Date: 08/09/2018 CLINICAL DATA:  Renal cell carcinoma with abdominal pain EXAM: ABDOMEN - 1 VIEW COMPARISON:  CT abdomen and pelvis August 07, 2018 FINDINGS: Contrast is seen in the colon. There is no bowel dilatation or air-fluid level to suggest bowel obstruction. No free air. Visualized lung bases are clear. No abnormal calcifications. IMPRESSION: No evident bowel obstruction or free air.  Lung bases clear. Electronically Signed   By: Lowella Grip III M.D.   On: 08/09/2018  13:53   Ct Abdomen W Contrast  Result Date: 08/10/2018 CLINICAL DATA:  History metastatic renal cell carcinoma. Duodenitis. Abdominal pain. EXAM: CT ABDOMEN WITH CONTRAST TECHNIQUE: Multidetector CT imaging of the abdomen was performed using the standard protocol following bolus administration of intravenous contrast. CONTRAST:  138mL OMNIPAQUE IOHEXOL 300 MG/ML  SOLN COMPARISON:  CT abdomen pelvis 08/07/2018 FINDINGS: Lower chest: Normal heart size. Small layering bilateral pleural effusions. Dependent atelectasis within the bilateral lower lobes. Stable paraesophageal lymph node measuring 2.0 cm (image 8; series 2), previously 2.2 cm. Hepatobiliary: Liver is normal in size and contour. Multiple stones within the gallbladder lumen. No gallbladder wall thickening. Stable perihepatic ascites. Unchanged 11 mm low-attenuation lesion right hepatic lobe (image 16; series 2). Pancreas: Peripancreatic fluid.  Pancreatic parenchyma enhances. Spleen: Unremarkable Adrenals/Urinary Tract: Stable left adrenal gland nodularity. Right adrenal gland is normal. Unchanged complex mass within the inferior pole of the right kidney measuring 10.3 x 9.0 cm. Simple cyst superior pole left kidney. No hydronephrosis. Stomach/Bowel: Normal morphology of the stomach. Wall thickening of the second-third portions of the duodenum. No evidence for small bowel obstruction. No free intraperitoneal air. Vascular/Lymphatic: Normal caliber abdominal aorta. Peripheral calcified atherosclerotic plaque. Stable retroperitoneal adenopathy including a 1.6 cm aortocaval lymph node (image 49; series 2) and a 1.2 cm left periaortic lymph node (image 54; series 2). Other: Small volume ascites.  No free intraperitoneal air. Musculoskeletal: Similar-appearing lytic lesions of the L1  and L4 vertebral bodies. IMPRESSION: 1. Similar-appearing wall thickening of the second third portion of the duodenum raising the possibility of duodenitis. 2. There is  peripancreatic fluid which may be secondary to duodenitis and reactive fluid. Possibility of pancreatitis not excluded. 3. Similar-appearing heterogeneous solid and cystic mass inferior pole right kidney, most compatible with renal cell carcinoma. 4. Similar-appearing retroperitoneal and retrocrural adenopathy. 5. Stable lytic osseous lesions. 6. Small bilateral pleural effusions and underlying opacities favored to represent atelectasis. Electronically Signed   By: Lovey Newcomer M.D.   On: 08/10/2018 13:16    Medications:  Scheduled: . docusate sodium  100 mg Oral BID  . insulin aspart  0-9 Units Subcutaneous Q4H  . linaclotide  145 mcg Oral QAC breakfast  . metoCLOPramide  10 mg Oral QID  . naloxegol oxalate  25 mg Oral Daily  . pantoprazole  40 mg Oral BID  . polyethylene glycol  34 g Oral TID  . potassium chloride  40 mEq Oral Q4H  . rivaroxaban  20 mg Oral Q supper  . sucralfate  1 g Oral TID WC & HS   Continuous: . sodium chloride Stopped (08/10/18 1357)  . dextrose 5 % and 0.9% NaCl 75 mL/hr at 08/11/18 0600    Assessment/Plan: 1) Duodenitis/duodenal ulceration. 2) Metastatic renal cell carcinoma.   There has been absolutely no change with his pain.  He continues to require Dilaudid every 2 hours and he rates his uncontrolled pain at an 8-10/10.  Constipation is not the source of his pain as he reports bowel movements with the current regimen.  If this was the source there would be some relief with the pain.  Plan: 1) Trial of a clear liquid diet.  The patient does will require nutrition and it is best to avoid a PICC line.  A feeding tube will most likely not extend beyond the area of inflammation in the duodenum. 2) Continue with the current laxative regimen since he requires such high dosing of narcotics. 3) Maintain sucralfate.  It was only started yesterday. 4) If he is able to tolerate some PO without any exacerbation of his pain and the pain persists, it may be that he can  be discharged home with high dose narcotics.  LOS: 4 days   Azim Gillingham D 08/11/2018, 7:51 AM

## 2018-08-11 NOTE — Progress Notes (Signed)
PROGRESS NOTE    Alexander Fry  LPF:[id number] DOB: January 11, 1961 DOA: 08/06/2018 PCP: [name], PA-C   Brief Narrative:  HPI On 08/07/2018 by Dr. [name] Alexander Fry  is a 57 y.o. male, w hx of metastatic renal cell carcinoma (bone, brain, lung), h/o R IJ thrombus, Iron deficiency anemia,  w recent admission for abdominal pain where he was found to have duodenitis.  Pt apparently went home and continued to have abdominal pain which was worse yesterday associated with some n/v.  Pt denies fever, chills, diarrhea, brbpr, black stool.  Pt has been taking protonix.  Denies NSAIDS.    Interim history Admitted for acute pancreatitis.  Gastroenterology consulted and appreciated.  Continues to have abdominal pain and is requiring both IV as well as oral pain medications quite frequently.  Assessment & Plan   Acute pancreatitis -CT abdomen pelvis shows redemonstration of transmural thickening of the second and third portion of the duodenum suggestive of duodenitis, some interval decrease. -Patient continues to need both IV Dilaudid and oral oxycodone frequently. -Triglycerides 73; lipase on admission 969-down to 139 -Gastroenterology consulted and appreciated recommended conservative management -General surgery consulted and appreciated, felt patient is a poor surgical candidate -Discussed with Dr. Benson Norway, gastroenterology, recommended obtaining KUB, wonders if some of patient's abdominal pain could be narcotic related constipation.  Recommended trying Linzess as well as Fleet enema- given on 9/4. -CT abd: possibility of duodenitis, peripancreatic fluid, pancreatitis not excluded   Abdominal pain -?secondary to the above vs constipation vs referred from renal cell -continue IV dilaudid, oxycodone -will order voltaren gel and K pad  Constipation -Though patient has not had any solid intake in many days, suspect medication induced.  Few bowel sounds noted on exam. -Abdominal x-ray on  08/06/2018 showed moderate volume of stool in the ascending colon -Discussed with Dr. Benson Norway today. Patient was given an enema on 9/4, with minimal BM yesterday. Was started on linzess. Dr. Benson Norway recommended repeat CT scan today.  -Continue MiraLAX 3 times daily, movantik -Encouraged patient to get out of bed and walk around -Discussed possibly using relastor or reglan with Dr. Benson Norway- felt Relastor and movantik were equal. -Placed on carafate -Has had 3 bowel movements  Duodenitis -Completed course of Cipro and Flagyl -Continue Protonix -CT abdomen pelvis as above  Abnormal LFTs -Trending downward very slowly -Hepatitis panel unremarkable  Hypokalemia -Potassium 3.1- continue to replace and monitor  -magnesium was checked, 1.9  Diabetes mellitus, type II -Continue insulin sliding scale with CBG monitoring  Metastatic renal cell carcinoma -Follow-up with oncology as an outpatient -Possibly elevation in alk phos due to metastatic bone disease -Discussed with Dr. Marin Olp- if abdominal pain is referred from cancer, may need nephrectomy- recommended urology consult  Chronic normocytic anemia -Appears to be stable  Right IJ thrombus -Continue Xarelto  DVT Prophylaxis  xarelto  Code Status: Full  Family Communication: None at bedside  Disposition Plan: Admitted.  He continues to need admission given his pain has been uncontrolled with both IV and oral narcotics.  Patient continues to receive these medications frequently.   Consultants Gastroenterology General surgery Oncology  Procedures  none  Antibiotics   Anti-infectives (From admission, onward)   Start     Dose/Rate Route Frequency Ordered Stop   08/07/18 1800  ciprofloxacin (CIPRO) IVPB 400 mg  Status:  Discontinued     400 mg 200 mL/hr over 60 Minutes Intravenous Every 12 hours 08/07/18 0321 08/09/18 1514   08/07/18 0300  metroNIDAZOLE (FLAGYL) IVPB  500 mg  Status:  Discontinued     500 mg 100 mL/hr over 60 Minutes  Intravenous Every 8 hours 08/07/18 0253 08/09/18 1514   08/07/18 0300  ciprofloxacin (CIPRO) IVPB 400 mg     400 mg 200 mL/hr over 60 Minutes Intravenous  Once 08/07/18 0258 08/07/18 0415      Subjective:   Alexander Fry seen and examined today.  Continues to have abdominal pain, in spite of using pain medication around-the-clock.  Denies any nausea or vomiting.  Was able to have 3 bowel movements yesterday evening.  Denies any chest pain, shortness of breath, dizziness or headache.  Objective:   Vitals:   08/10/18 1317 08/10/18 2010 08/11/18 0353 08/11/18 0359  BP: 133/84 131/75  (!) 132/92  Pulse: 83 83  89  Resp: (!) _0 Temp: 98.9 F (37.2 C) 99.1 F (37.3 C)  98.5 F (36.9 C)  TempSrc: Oral Oral  Oral  SpO2: 96% 96%  98%  Weight:   97.7 kg   Height:        Intake/Output Summary (Last 24 hours) at 08/11/2018 1224 Last data filed at 08/11/2018 0600 Gross per 24 hour  Intake 2108.3 ml  Output -  Net 2108.3 ml   Filed Weights   08/09/18 0412 08/10/18 0403 08/11/18 0353  Weight: 95.9 kg 97.3 kg 97.7 kg   Exam  General: Well developed, well nourished, NAD, appears stated age  57: NCAT, mucous membranes moist.   Neck: Supple  Cardiovascular: S1 S2 auscultated, RRR, no murmurs  Respiratory: Clear to auscultation bilaterally with equal chest rise  Abdomen: Soft, mildly TTP (RUQ), nondistended, + bowel sounds  MSK: fullness right flank  Extremities: warm dry without cyanosis clubbing or edema  Neuro: AAOx3, nonfocal  Psych: Normal affect and demeanor, pleasant   Data Reviewed: I have personally reviewed following labs and imaging studies  CBC: Recent Labs  Lab 08/06/18 2343 08/07/18 0448 08/10/18 0420  WBC 6.5 6.0 6.6  NEUTROABS 5.2  --   --   HGB 12.4* 12.0* 10.9*  HCT 38.4* 37.4* 34.1*  MCV 75.7* 76.3* 76.5*  PLT 248 224 176   Basic Metabolic Panel: Recent Labs  Lab 08/07/18 0448 08/08/18 1205 08/09/18 0352 08/09/18 1114 08/10/18 0420  08/11/18 0337  NA 139 140 138  --  138 139  K 3.7 3.5 3.2*  --  3.1* 3.1*  CL 102 102 101  --  101 102  CO2 _1 --  27 29  GLUCOSE 118* 127* 129*  --  149* 117*  BUN 5* 5* <5*  --  <5* <5*  CREATININE 0.42* 0.44* 0.34*  --  0.34* 0.36*  CALCIUM 8.6* 8.6* 8.5*  --  8.5* 8.3*  MG  --   --   --  2.0  --  1.9   GFR: Estimated Creatinine Clearance: 121.9 mL/min (A) (by C-G formula based on SCr of 0.36 mg/dL (L)). Liver Function Tests: Recent Labs  Lab 08/07/18 0448 08/08/18 1205 08/09/18 0352 08/10/18 0420 08/11/18 0337  AST 161* 198* 173* 142* 157*  ALT 177* 188* 167* 146* 140*  ALKPHOS 749* 917* 876* 878* 916*  BILITOT 6.1* 7.9* 8.5* 9.6* 10.1*  PROT 5.8* 5.9* 5.7* 5.6* 5.2*  ALBUMIN 2.7* 2.6* 2.4* 2.4* 2.2*   Recent Labs  Lab 08/06/18 2343 08/07/18 0448 08/09/18 1114 08/09/18 1257 08/11/18 0337  LIPASE 969* 572* 185* 180* 139*   No results for input(s): AMMONIA in the last 168  hours. Coagulation Profile: No results for input(s): INR, PROTIME in the last 168 hours. Cardiac Enzymes: No results for input(s): CKTOTAL, CKMB, CKMBINDEX, TROPONINI in the last 168 hours. BNP (last 3 results) No results for input(s): PROBNP in the last 8760 hours. HbA1C: No results for input(s): HGBA1C in the last 72 hours. CBG: Recent Labs  Lab 08/10/18 2008 08/10/18 2334 08/11/18 0357 08/11/18 0739 08/11/18 1150  GLUCAP 111* 131* 99 112* 125*   Lipid Profile: No results for input(s): CHOL, HDL, LDLCALC, TRIG, CHOLHDL, LDLDIRECT in the last 72 hours. Thyroid Function Tests: No results for input(s): TSH, T4TOTAL, FREET4, T3FREE, THYROIDAB in the last 72 hours. Anemia Panel: No results for input(s): VITAMINB12, FOLATE, FERRITIN, TIBC, IRON, RETICCTPCT in the last 72 hours. Urine analysis:    Component Value Date/Time   COLORURINE AMBER (A) 08/01/2018 1107   APPEARANCEUR CLEAR 08/01/2018 1107   LABSPEC 1.021 08/01/2018 1107   PHURINE 5.0 08/01/2018 1107   GLUCOSEU 50 (A)  08/01/2018 1107   HGBUR SMALL (A) 08/01/2018 1107   BILIRUBINUR MODERATE (A) 08/01/2018 1107   KETONESUR 80 (A) 08/01/2018 1107   PROTEINUR 30 (A) 08/01/2018 1107   NITRITE NEGATIVE 08/01/2018 1107   LEUKOCYTESUR SMALL (A) 08/01/2018 1107   Sepsis Labs: _0 (procalcitonin:4,lacticidven:4)  )No results found for this or any previous visit (from the past 240 hour(s)).    Radiology Studies: Dg Abd 1 View  Result Date: 08/09/2018 CLINICAL DATA:  Renal cell carcinoma with abdominal pain EXAM: ABDOMEN - 1 VIEW COMPARISON:  CT abdomen and pelvis August 07, 2018 FINDINGS: Contrast is seen in the colon. There is no bowel dilatation or air-fluid level to suggest bowel obstruction. No free air. Visualized lung bases are clear. No abnormal calcifications. IMPRESSION: No evident bowel obstruction or free air.  Lung bases clear. Electronically Signed   By: Lowella Grip III M.D.   On: 08/09/2018 13:53   Ct Abdomen W Contrast  Result Date: 08/10/2018 CLINICAL DATA:  History metastatic renal cell carcinoma. Duodenitis. Abdominal pain. EXAM: CT ABDOMEN WITH CONTRAST TECHNIQUE: Multidetector CT imaging of the abdomen was performed using the standard protocol following bolus administration of intravenous contrast. CONTRAST:  165m OMNIPAQUE IOHEXOL 300 MG/ML  SOLN COMPARISON:  CT abdomen pelvis 08/07/2018 FINDINGS: Lower chest: Normal heart size. Small layering bilateral pleural effusions. Dependent atelectasis within the bilateral lower lobes. Stable paraesophageal lymph node measuring 2.0 cm (image 8; series 2), previously 2.2 cm. Hepatobiliary: Liver is normal in size and contour. Multiple stones within the gallbladder lumen. No gallbladder wall thickening. Stable perihepatic ascites. Unchanged 11 mm low-attenuation lesion right hepatic lobe (image 16; series 2). Pancreas: Peripancreatic fluid.  Pancreatic parenchyma enhances. Spleen: Unremarkable Adrenals/Urinary Tract: Stable left adrenal gland  nodularity. Right adrenal gland is normal. Unchanged complex mass within the inferior pole of the right kidney measuring 10.3 x 9.0 cm. Simple cyst superior pole left kidney. No hydronephrosis. Stomach/Bowel: Normal morphology of the stomach. Wall thickening of the second-third portions of the duodenum. No evidence for small bowel obstruction. No free intraperitoneal air. Vascular/Lymphatic: Normal caliber abdominal aorta. Peripheral calcified atherosclerotic plaque. Stable retroperitoneal adenopathy including a 1.6 cm aortocaval lymph node (image 49; series 2) and a 1.2 cm left periaortic lymph node (image 54; series 2). Other: Small volume ascites.  No free intraperitoneal air. Musculoskeletal: Similar-appearing lytic lesions of the L1 and L4 vertebral bodies. IMPRESSION: 1. Similar-appearing wall thickening of the second third portion of the duodenum raising the possibility of duodenitis. 2. There is peripancreatic fluid which may  be secondary to duodenitis and reactive fluid. Possibility of pancreatitis not excluded. 3. Similar-appearing heterogeneous solid and cystic mass inferior pole right kidney, most compatible with renal cell carcinoma. 4. Similar-appearing retroperitoneal and retrocrural adenopathy. 5. Stable lytic osseous lesions. 6. Small bilateral pleural effusions and underlying opacities favored to represent atelectasis. Electronically Signed   By: Lovey Newcomer M.D.   On: 08/10/2018 13:16     Scheduled Meds: . diclofenac sodium  2 g Topical QID  . docusate sodium  100 mg Oral BID  . insulin aspart  0-9 Units Subcutaneous Q4H  . linaclotide  145 mcg Oral QAC breakfast  . metoCLOPramide  10 mg Oral QID  . naloxegol oxalate  25 mg Oral Daily  . pantoprazole  40 mg Oral BID  . polyethylene glycol  34 g Oral TID  . potassium chloride  40 mEq Oral Q4H  . rivaroxaban  20 mg Oral Q supper  . sucralfate  1 g Oral TID WC & HS   Continuous Infusions: . sodium chloride Stopped (08/10/18 1357)    . dextrose 5 % and 0.9% NaCl 75 mL/hr at 08/11/18 0600     LOS: 4 days   Time Spent in minutes   45 minutes (greater than 50% of time spent with patient face to face, as well as reviewing records, calling consults, and formulating a plan)  Cristal Ford D.O. on 08/11/2018 at 12:24 PM  Between 7am to 7pm - Please see pager noted on [email or web address]  After 7pm go to [email or web address]  And look for the night coverage person covering for me after hours  [facility] Office  [phone]

## 2018-08-12 DIAGNOSIS — K2980 Duodenitis without bleeding: Secondary | ICD-10-CM

## 2018-08-12 DIAGNOSIS — R1084 Generalized abdominal pain: Secondary | ICD-10-CM

## 2018-08-12 DIAGNOSIS — T402X5A Adverse effect of other opioids, initial encounter: Secondary | ICD-10-CM

## 2018-08-12 DIAGNOSIS — E440 Moderate protein-calorie malnutrition: Secondary | ICD-10-CM

## 2018-08-12 DIAGNOSIS — K5903 Drug induced constipation: Secondary | ICD-10-CM

## 2018-08-12 LAB — CBC
HEMATOCRIT: 34.2 % — AB (ref 39.0–52.0)
HEMOGLOBIN: 11.1 g/dL — AB (ref 13.0–17.0)
MCH: 24.9 pg — ABNORMAL LOW (ref 26.0–34.0)
MCHC: 32.5 g/dL (ref 30.0–36.0)
MCV: 76.7 fL — ABNORMAL LOW (ref 78.0–100.0)
Platelets: 370 10*3/uL (ref 150–400)
RBC: 4.46 MIL/uL (ref 4.22–5.81)
RDW: 19.8 % — AB (ref 11.5–15.5)
WBC: 7.9 10*3/uL (ref 4.0–10.5)

## 2018-08-12 LAB — BASIC METABOLIC PANEL
Anion gap: 9 (ref 5–15)
BUN: <5 mg/dL — ABNORMAL LOW (ref 6–20)
CHLORIDE: 102 mmol/L (ref 98–111)
CO2: 27 mmol/L (ref 22–32)
CREATININE: 0.3 mg/dL — AB (ref 0.61–1.24)
Calcium: 8.7 mg/dL — ABNORMAL LOW (ref 8.9–10.3)
GFR calc Af Amer: >60 mL/min (ref >60)
GFR calc non Af Amer: >60 mL/min (ref >60)
Glucose, Bld: 134 mg/dL — ABNORMAL HIGH (ref 70–99)
Potassium: 3.8 mmol/L (ref 3.5–5.1)
SODIUM: 138 mmol/L (ref 135–145)

## 2018-08-12 LAB — GLUCOSE, CAPILLARY
GLUCOSE-CAPILLARY: 104 mg/dL — AB (ref 70–99)
GLUCOSE-CAPILLARY: 129 mg/dL — AB (ref 70–99)
Glucose-Capillary: 107 mg/dL — ABNORMAL HIGH (ref 70–99)
Glucose-Capillary: 123 mg/dL — ABNORMAL HIGH (ref 70–99)
Glucose-Capillary: 159 mg/dL — ABNORMAL HIGH (ref 70–99)
Glucose-Capillary: 123 mg/dL — ABNORMAL HIGH (ref 70–99)

## 2018-08-12 MED ORDER — KETOROLAC TROMETHAMINE 15 MG/ML IJ SOLN
15.0000 mg | Freq: Four times a day (QID) | INTRAMUSCULAR | Status: DC | PRN
  Administered 2018-08-12 (×2): 15 mg via INTRAVENOUS
  Filled 2018-08-12 (×2): qty 1

## 2018-08-12 MED ORDER — BOOST / RESOURCE BREEZE PO LIQD CUSTOM
1.0000 | Freq: Three times a day (TID) | ORAL | Status: DC
  Administered 2018-08-12 – 2018-08-14 (×3): 1 via ORAL

## 2018-08-12 NOTE — Progress Notes (Signed)
PROGRESS NOTE    Zadiel Leyh  TIR:[id number] DOB: 1961-10-10 DOA: 08/06/2018 PCP: [name], PA-C   Brief Narrative:  HPI On 08/07/2018 by Dr. [name] Kaelen Caughlin  is a 57 y.o. male, w hx of metastatic renal cell carcinoma (bone, brain, lung), h/o R IJ thrombus, Iron deficiency anemia,  w recent admission for abdominal pain where he was found to have duodenitis.  Pt apparently went home and continued to have abdominal pain which was worse yesterday associated with some n/v.  Pt denies fever, chills, diarrhea, brbpr, black stool.  Pt has been taking protonix.  Denies NSAIDS.    Interim history Admitted for acute pancreatitis.  Gastroenterology consulted and appreciated.  Continues to have abdominal pain and is requiring both IV as well as oral pain medications quite frequently.  Assessment & Plan   Acute pancreatitis -CT abdomen pelvis shows redemonstration of transmural thickening of the second and third portion of the duodenum suggestive of duodenitis, some interval decrease. -Patient continues to need both IV Dilaudid and oral oxycodone frequently. -Triglycerides 73; lipase on admission 969-down to 139 -Gastroenterology consulted and appreciated recommended conservative management -General surgery consulted and appreciated, felt patient is a poor surgical candidate -Discussed with Dr. Benson Norway, gastroenterology, recommended obtaining KUB, wonders if some of patient's abdominal pain could be narcotic related constipation.  Recommended trying Linzess as well as Fleet enema- given on 9/4. -CT abd: possibility of duodenitis, peripancreatic fluid, pancreatitis not excluded   Abdominal pain -?secondary to the above vs constipation vs referred from renal cell -continue IV dilaudid, oxycodone -pain seems to have improved some today -will add on low dose toradol for anti-inflammatory properties  -started on clear liquid diet and tolerated well, will advance to full liquid diet  today  Constipation -Though patient has not had any solid intake in many days, suspect medication induced.  Few bowel sounds noted on exam. -Abdominal x-ray on 08/06/2018 showed moderate volume of stool in the ascending colon -Discussed with Dr. Benson Norway today. Patient was given an enema on 9/4, with minimal BM yesterday. Was started on linzess. Dr. Benson Norway recommended repeat CT scan today.  -Continue MiraLAX 3 times daily, movantik -Encouraged patient to get out of bed and walk around -Discussed possibly using relastor or reglan with Dr. Benson Norway- felt Relastor and movantik were equal. -Placed on carafate -seems to be improving with current regimen  Duodenitis -Completed course of Cipro and Flagyl -Continue Protonix -CT abdomen pelvis as above  Abnormal LFTs -Trending downward very slowly -Hepatitis panel unremarkable  Hypokalemia -Resolved, Potassium 3.8 today -magnesium was checked, 1.9  Diabetes mellitus, type II -Continue insulin sliding scale with CBG monitoring  Metastatic renal cell carcinoma -Follow-up with oncology as an outpatient -Possibly elevation in alk phos due to metastatic bone disease -Discussed with Dr. Marin Olp- if abdominal pain is referred from cancer, may need nephrectomy- recommended urology consult -discussed with patient, he feels he would like to wait on urology consult and possibly pursue this as an outpatient following a PET scan  Chronic normocytic anemia -Appears to be stable  Right IJ thrombus -Continue Xarelto  DVT Prophylaxis  xarelto  Code Status: Full  Family Communication: None at bedside  Disposition Plan: Admitted.  He continues to need admission given his pain has been uncontrolled with both IV and oral narcotics.  Patient continues to receive these medications frequently. Will add on IV toradol.  Consultants Gastroenterology General surgery Oncology  Procedures  none  Antibiotics   Anti-infectives (From admission, onward)  Start      Dose/Rate Route Frequency Ordered Stop   08/07/18 1800  ciprofloxacin (CIPRO) IVPB 400 mg  Status:  Discontinued     400 mg 200 mL/hr over 60 Minutes Intravenous Every 12 hours 08/07/18 0321 08/09/18 1514   08/07/18 0300  metroNIDAZOLE (FLAGYL) IVPB 500 mg  Status:  Discontinued     500 mg 100 mL/hr over 60 Minutes Intravenous Every 8 hours 08/07/18 0253 08/09/18 1514   08/07/18 0300  ciprofloxacin (CIPRO) IVPB 400 mg     400 mg 200 mL/hr over 60 Minutes Intravenous  Once 08/07/18 0258 08/07/18 0415      Subjective:   Jule Schlabach seen and examined today.  Continues to have abdominal pain but states it is improved since prior days.  Continues to have more right flank/back pain.  Feels abdominal bloating has improved.  Able to tolerate clear liquid diet.  Denies current chest pain, shortness of breath, nausea or vomiting, dizziness or headache.  Objective:   Vitals:   08/11/18 0359 08/11/18 1425 08/11/18 2011 08/12/18 0422  BP: (!) 132/92 138/85 (!) 155/86 (!) 152/90  Pulse: 89 87 86 82  Resp: '18 10 18 16  ' Temp: 98.5 F (36.9 C) 98.7 F (37.1 C) 98.5 F (36.9 C) 98.5 F (36.9 C)  TempSrc: Oral Oral Oral Oral  SpO2: 98% 98% 99% 99%  Weight:    98.4 kg  Height:        Intake/Output Summary (Last 24 hours) at 08/12/2018 1050 Last data filed at 08/12/2018 0939 Gross per 24 hour  Intake 2420 ml  Output 3 ml  Net 2417 ml   Filed Weights   08/10/18 0403 08/11/18 0353 08/12/18 0422  Weight: 97.3 kg 97.7 kg 98.4 kg   Exam  General: Well developed, well nourished, NAD, appears stated age  HEENT: NCAT, mucous membranes moist.   Neck: Supple  Cardiovascular: S1 S2 auscultated, no rubs, murmurs or gallops. Regular rate and rhythm.  Respiratory: Clear to auscultation bilaterally with equal chest rise  Abdomen: Soft, mild TTP, nondistended, + bowel sounds  Extremities: warm dry without cyanosis clubbing or edema  Neuro: AAOx3, nonfocal   Skin: Without rashes exudates or  nodules  Psych: Appropriate mood and affect, pleasant   Data Reviewed: I have personally reviewed following labs and imaging studies  CBC: Recent Labs  Lab 08/06/18 2343 08/07/18 0448 08/10/18 0420 08/12/18 0412  WBC 6.5 6.0 6.6 7.9  NEUTROABS 5.2  --   --   --   HGB 12.4* 12.0* 10.9* 11.1*  HCT 38.4* 37.4* 34.1* 34.2*  MCV 75.7* 76.3* 76.5* 76.7*  PLT 248 224 262 488   Basic Metabolic Panel: Recent Labs  Lab 08/08/18 1205 08/09/18 0352 08/09/18 1114 08/10/18 0420 08/11/18 0337 08/12/18 0412  NA 140 138  --  138 139 138  K 3.5 3.2*  --  3.1* 3.1* 3.8  CL 102 101  --  101 102 102  CO2 26 27  --  '27 29 27  ' GLUCOSE 127* 129*  --  149* 117* 134*  BUN 5* <5*  --  <5* <5* <5*  CREATININE 0.44* 0.34*  --  0.34* 0.36* 0.30*  CALCIUM 8.6* 8.5*  --  8.5* 8.3* 8.7*  MG  --   --  2.0  --  1.9  --    GFR: Estimated Creatinine Clearance: 122.3 mL/min (A) (by C-G formula based on SCr of 0.3 mg/dL (L)). Liver Function Tests: Recent Labs  Lab 08/07/18 0448 08/08/18  1205 08/09/18 0352 08/10/18 0420 08/11/18 0337  AST 161* 198* 173* 142* 157*  ALT 177* 188* 167* 146* 140*  ALKPHOS 749* 917* 876* 878* 916*  BILITOT 6.1* 7.9* 8.5* 9.6* 10.1*  PROT 5.8* 5.9* 5.7* 5.6* 5.2*  ALBUMIN 2.7* 2.6* 2.4* 2.4* 2.2*   Recent Labs  Lab 08/06/18 2343 08/07/18 0448 08/09/18 1114 08/09/18 1257 08/11/18 0337  LIPASE 969* 572* 185* 180* 139*   No results for input(s): AMMONIA in the last 168 hours. Coagulation Profile: No results for input(s): INR, PROTIME in the last 168 hours. Cardiac Enzymes: No results for input(s): CKTOTAL, CKMB, CKMBINDEX, TROPONINI in the last 168 hours. BNP (last 3 results) No results for input(s): PROBNP in the last 8760 hours. HbA1C: No results for input(s): HGBA1C in the last 72 hours. CBG: Recent Labs  Lab 08/11/18 1653 08/11/18 2007 08/12/18 0001 08/12/18 0410 08/12/18 0720  GLUCAP 106* 134* 104* 107* 123*   Lipid Profile: No results for  input(s): CHOL, HDL, LDLCALC, TRIG, CHOLHDL, LDLDIRECT in the last 72 hours. Thyroid Function Tests: No results for input(s): TSH, T4TOTAL, FREET4, T3FREE, THYROIDAB in the last 72 hours. Anemia Panel: No results for input(s): VITAMINB12, FOLATE, FERRITIN, TIBC, IRON, RETICCTPCT in the last 72 hours. Urine analysis:    Component Value Date/Time   COLORURINE AMBER (A) 08/01/2018 1107   APPEARANCEUR CLEAR 08/01/2018 1107   LABSPEC 1.021 08/01/2018 1107   PHURINE 5.0 08/01/2018 1107   GLUCOSEU 50 (A) 08/01/2018 1107   HGBUR SMALL (A) 08/01/2018 1107   BILIRUBINUR MODERATE (A) 08/01/2018 1107   KETONESUR 80 (A) 08/01/2018 1107   PROTEINUR 30 (A) 08/01/2018 1107   NITRITE NEGATIVE 08/01/2018 1107   LEUKOCYTESUR SMALL (A) 08/01/2018 1107   Sepsis Labs: '@LABRCNTIP' (procalcitonin:4,lacticidven:4)  )No results found for this or any previous visit (from the past 240 hour(s)).    Radiology Studies: Ct Abdomen W Contrast  Result Date: 08/10/2018 CLINICAL DATA:  History metastatic renal cell carcinoma. Duodenitis. Abdominal pain. EXAM: CT ABDOMEN WITH CONTRAST TECHNIQUE: Multidetector CT imaging of the abdomen was performed using the standard protocol following bolus administration of intravenous contrast. CONTRAST:  174m OMNIPAQUE IOHEXOL 300 MG/ML  SOLN COMPARISON:  CT abdomen pelvis 08/07/2018 FINDINGS: Lower chest: Normal heart size. Small layering bilateral pleural effusions. Dependent atelectasis within the bilateral lower lobes. Stable paraesophageal lymph node measuring 2.0 cm (image 8; series 2), previously 2.2 cm. Hepatobiliary: Liver is normal in size and contour. Multiple stones within the gallbladder lumen. No gallbladder wall thickening. Stable perihepatic ascites. Unchanged 11 mm low-attenuation lesion right hepatic lobe (image 16; series 2). Pancreas: Peripancreatic fluid.  Pancreatic parenchyma enhances. Spleen: Unremarkable Adrenals/Urinary Tract: Stable left adrenal gland  nodularity. Right adrenal gland is normal. Unchanged complex mass within the inferior pole of the right kidney measuring 10.3 x 9.0 cm. Simple cyst superior pole left kidney. No hydronephrosis. Stomach/Bowel: Normal morphology of the stomach. Wall thickening of the second-third portions of the duodenum. No evidence for small bowel obstruction. No free intraperitoneal air. Vascular/Lymphatic: Normal caliber abdominal aorta. Peripheral calcified atherosclerotic plaque. Stable retroperitoneal adenopathy including a 1.6 cm aortocaval lymph node (image 49; series 2) and a 1.2 cm left periaortic lymph node (image 54; series 2). Other: Small volume ascites.  No free intraperitoneal air. Musculoskeletal: Similar-appearing lytic lesions of the L1 and L4 vertebral bodies. IMPRESSION: 1. Similar-appearing wall thickening of the second third portion of the duodenum raising the possibility of duodenitis. 2. There is peripancreatic fluid which may be secondary to duodenitis and reactive fluid.  Possibility of pancreatitis not excluded. 3. Similar-appearing heterogeneous solid and cystic mass inferior pole right kidney, most compatible with renal cell carcinoma. 4. Similar-appearing retroperitoneal and retrocrural adenopathy. 5. Stable lytic osseous lesions. 6. Small bilateral pleural effusions and underlying opacities favored to represent atelectasis. Electronically Signed   By: Lovey Newcomer M.D.   On: 08/10/2018 13:16     Scheduled Meds: . diclofenac sodium  2 g Topical QID  . docusate sodium  100 mg Oral BID  . feeding supplement  1 Container Oral TID BM  . insulin aspart  0-9 Units Subcutaneous Q4H  . linaclotide  145 mcg Oral QAC breakfast  . metoCLOPramide  10 mg Oral QID  . naloxegol oxalate  25 mg Oral Daily  . pantoprazole  40 mg Oral BID  . polyethylene glycol  34 g Oral TID  . rivaroxaban  20 mg Oral Q supper  . sucralfate  1 g Oral TID WC & HS   Continuous Infusions: . sodium chloride Stopped (08/10/18  1357)  . dextrose 5 % and 0.9% NaCl 75 mL/hr at 08/12/18 0420     LOS: 5 days   Time Spent in minutes   30 minutes  Keaghan Staton D.O. on 08/12/2018 at 10:50 AM  Between 7am to 7pm - Please see pager noted on [email or web address]  After 7pm go to [email or web address]  And look for the night coverage person covering for me after hours  [facility] Office  [phone]

## 2018-08-12 NOTE — Progress Notes (Signed)
[facility] Progress Note   (covering for Dr. Carol Ada this weekend)  Chief Complaint: Generalized abdominal pain  History:  Alexander Fry reports that his abdominal pain has decreased from yesterday, and that he also started having semi-formed to liquid bowel movements on the current regimen.  His appetite is starting to come back and he would like to advance to full liquid diet.  He denies vomiting, hematemesis or rectal bleeding.  ROS: Cardiovascular:  no chest pain Respiratory: no dyspnea  Objective:  Med list reviewed  Vital signs in last 24 hrs: Vitals:   08/11/18 2011 08/12/18 0422  BP: (!) 155/86 (!) 152/90  Pulse: 86 82  Resp: 18 16  Temp: 98.5 F (36.9 C) 98.5 F (36.9 C)  SpO2: 99% 99%    Physical Exam   HEENT: sclera anicteric, oral mucosa moist without lesions  Neck: supple, no thyromegaly, JVD or lymphadenopathy  Cardiac: RRR without murmurs, S1S2 heard, no peripheral edema  Pulm: clear to auscultation bilaterally, normal RR and effort noted  Abdomen: soft, mild lower tenderness, with active bowel sounds. No guarding or palpable hepatosplenomegaly  Skin; warm and dry, no jaundice or rash  Recent Labs:  Recent Labs  Lab 08/07/18 0448 08/10/18 0420 08/12/18 0412  WBC 6.0 6.6 7.9  HGB 12.0* 10.9* 11.1*  HCT 37.4* 34.1* 34.2*  PLT 224 262 370   Recent Labs  Lab 08/11/18 0337 08/12/18 0412  NA 139 138  K 3.1* 3.8  CL 102 102  CO2 29 27  BUN <5* <5*  ALBUMIN 2.2*  --   ALKPHOS 916*  --   ALT 140*  --   AST 157*  --   GLUCOSE 117* 134*   No results for input(s): INR in the last 168 hours.  Radiologic studies:  I personally reviewed his most recent CT scan of the abdomen and pelvis.  The large, complex, right renal mass is adjacent to and probably invading the distal duodenum, accounting for the thickening on CT and endoscopic findings.  The renal tumor is also causing ascites.  @ASSESSMENTPLANBEGIN @ Assessment:  Generalized abdominal  pain -this appears to be from a combination of metastatic renal cell carcinoma and constipation.  Pain is improved today. Renal cell carcinoma, multifocal metastasis.  He was on his fourth round of chemotherapeutic agent starting in June, recently stopped.  Based on the CT and endoscopic findings, the duodenitis does not seem likely to be from his chemotherapeutic agent.  I suspect the tumor is invading the small bowel, but at a deeper level then can be determined from the mucosal biopsies that were taken.  Moderate protein calorie malnutrition from metastatic malignancy and recent poor nutritional intake due to abdominal pain.  Plan: Continue bowel regimen and opioid pain control Advance to full liquid diet with protein calorie supplements I will see the patient over the weekend if called, and Dr. Benson Norway will be available Monday.  Total time 25 minutes, over half spent face-to-face with patient discussing diagnosis and plan.  Extra time spent reviewing the radiographic images. Nelida Meuse III Office: [phone]

## 2018-08-13 LAB — COMPREHENSIVE METABOLIC PANEL
ALBUMIN: 2.2 g/dL — AB (ref 3.5–5.0)
ALT: 125 U/L — ABNORMAL HIGH (ref 0–44)
ANION GAP: 6 (ref 5–15)
AST: 148 U/L — ABNORMAL HIGH (ref 15–41)
Alkaline Phosphatase: 1020 U/L — ABNORMAL HIGH (ref 38–126)
BUN: 5 mg/dL — ABNORMAL LOW (ref 6–20)
CO2: 28 mmol/L (ref 22–32)
Calcium: 8.4 mg/dL — ABNORMAL LOW (ref 8.9–10.3)
Chloride: 103 mmol/L (ref 98–111)
Creatinine, Ser: 0.38 mg/dL — ABNORMAL LOW (ref 0.61–1.24)
GFR calc Af Amer: >60 mL/min (ref >60)
GFR calc non Af Amer: >60 mL/min (ref >60)
GLUCOSE: 130 mg/dL — AB (ref 70–99)
Potassium: 3.4 mmol/L — ABNORMAL LOW (ref 3.5–5.1)
Sodium: 137 mmol/L (ref 135–145)
Total Bilirubin: 11.8 mg/dL — ABNORMAL HIGH (ref 0.3–1.2)
Total Protein: 5 g/dL — ABNORMAL LOW (ref 6.5–8.1)

## 2018-08-13 LAB — CBC
HCT: 30.9 % — ABNORMAL LOW (ref 39.0–52.0)
Hemoglobin: 9.9 g/dL — ABNORMAL LOW (ref 13.0–17.0)
MCH: 24.7 pg — AB (ref 26.0–34.0)
MCHC: 32 g/dL (ref 30.0–36.0)
MCV: 77.1 fL — ABNORMAL LOW (ref 78.0–100.0)
Platelets: 292 10*3/uL (ref 150–400)
RBC: 4.01 MIL/uL — AB (ref 4.22–5.81)
RDW: 19.9 % — ABNORMAL HIGH (ref 11.5–15.5)
WBC: 7.1 10*3/uL (ref 4.0–10.5)

## 2018-08-13 LAB — HEMOGLOBIN AND HEMATOCRIT, BLOOD
HCT: 33.1 % — ABNORMAL LOW (ref 39.0–52.0)
Hemoglobin: 10.6 g/dL — ABNORMAL LOW (ref 13.0–17.0)

## 2018-08-13 LAB — GLUCOSE, CAPILLARY
GLUCOSE-CAPILLARY: 116 mg/dL — AB (ref 70–99)
GLUCOSE-CAPILLARY: 119 mg/dL — AB (ref 70–99)
GLUCOSE-CAPILLARY: 123 mg/dL — AB (ref 70–99)
Glucose-Capillary: 126 mg/dL — ABNORMAL HIGH (ref 70–99)
Glucose-Capillary: 119 mg/dL — ABNORMAL HIGH (ref 70–99)
Glucose-Capillary: 140 mg/dL — ABNORMAL HIGH (ref 70–99)

## 2018-08-13 LAB — LIPASE, BLOOD: Lipase: 113 U/L — ABNORMAL HIGH (ref 11–51)

## 2018-08-13 MED ORDER — POTASSIUM CHLORIDE CRYS ER 20 MEQ PO TBCR
40.0000 meq | EXTENDED_RELEASE_TABLET | Freq: Once | ORAL | Status: AC
  Administered 2018-08-13: 40 meq via ORAL
  Filled 2018-08-13: qty 2

## 2018-08-13 MED ORDER — FENTANYL 12 MCG/HR TD PT72
12.5000 ug | MEDICATED_PATCH | TRANSDERMAL | Status: DC
  Administered 2018-08-13: 12.5 ug via TRANSDERMAL
  Filled 2018-08-13: qty 1

## 2018-08-13 MED ORDER — PEG-KCL-NACL-NASULF-NA ASC-C 100 G PO SOLR
0.5000 | Freq: Once | ORAL | Status: DC
  Filled 2018-08-13: qty 1

## 2018-08-13 NOTE — Progress Notes (Signed)
PROGRESS NOTE    Alexander Fry  FXT:[id number] DOB: [birth year] DOA: 08/06/2018 PCP: [name], PA-C   Brief Narrative:  HPI On 08/07/2018 by Dr. [name] Alexander Fry  is a 57 y.o. male, w hx of metastatic renal cell carcinoma (bone, brain, lung), h/o R IJ thrombus, Iron deficiency anemia,  w recent admission for abdominal pain where he was found to have duodenitis.  Pt apparently went home and continued to have abdominal pain which was worse yesterday associated with some n/v.  Pt denies fever, chills, diarrhea, brbpr, black stool.  Pt has been taking protonix.  Denies NSAIDS.    Interim history Admitted for acute pancreatitis.  Gastroenterology consulted and appreciated.  Continues to have abdominal pain and is requiring both IV as well as oral pain medications quite frequently.  Assessment & Plan   Acute pancreatitis -CT abdomen pelvis shows redemonstration of transmural thickening of the second and third portion of the duodenum suggestive of duodenitis, some interval decrease. -Patient continues to need both IV Dilaudid and oral oxycodone frequently. -Triglycerides 73; lipase on admission 969-down to 113 -Gastroenterology consulted and appreciated recommended conservative management -General surgery consulted and appreciated, felt patient is a poor surgical candidate -Discussed with Dr. Benson Norway, gastroenterology, recommended obtaining KUB, wonders if some of patient's abdominal pain could be narcotic related constipation.  Recommended trying Linzess as well as Fleet enema- given on 9/4. -CT abd: possibility of duodenitis, peripancreatic fluid, pancreatitis not excluded   Abdominal pain -?secondary to the above vs constipation vs referred from renal cell -continue IV dilaudid, oxycodone -continues to have pain today, will start on low dose fentanyl patch -Continue toradol for anti-inflammatory properties  -Will discuss with oncology   Constipation -Though patient has not had  any solid intake in many days, suspect medication induced.  Few bowel sounds noted on exam. -Abdominal x-ray on 08/06/2018 showed moderate volume of stool in the ascending colon -Discussed with Dr. Benson Norway today. Patient was given an enema on 9/4, with minimal BM yesterday. Was started on linzess. Dr. Benson Norway recommended repeat CT scan today.  -Continue MiraLAX 3 times daily, movantik -Encouraged patient to get out of bed and walk around -Discussed possibly using relastor or reglan with Dr. Benson Norway- felt Relastor and movantik were equal. -Placed on carafate -Discussed with gastroenterology, Dr. Loletha Carrow recommended Moviprep  Duodenitis -Completed course of Cipro and Flagyl -Continue Protonix -CT abdomen pelvis as above  Abnormal LFTs -Trending downward very slowly -Hepatitis panel unremarkable  Hypokalemia -potassium .3.4, will replace and continue to monitor  -magnesium was checked, 1.9  Diabetes mellitus, type II -Continue insulin sliding scale with CBG monitoring  Metastatic renal cell carcinoma -Follow-up with oncology as an outpatient -Possibly elevation in alk phos due to metastatic bone disease -Discussed with Dr. Marin Olp- if abdominal pain is referred from cancer, may need nephrectomy- recommended urology consult -discussed with patient, he feels he would like to wait on urology consult and possibly pursue this as an outpatient following a PET scan  Chronic normocytic anemia -Appears to be stable, currently 10.6  Right IJ thrombus -Continue Xarelto  DVT Prophylaxis  xarelto  Code Status: Full  Family Communication: None at bedside  Disposition Plan: Admitted.  He continues to need admission given his pain has been uncontrolled with both IV and oral narcotics.  Patient continues to receive these medications frequently.  Have added fentanyl patch  Consultants Gastroenterology General surgery Oncology  Procedures  none  Antibiotics   Anti-infectives (From admission,  onward)   Start  Dose/Rate Route Frequency Ordered Stop   08/07/18 1800  ciprofloxacin (CIPRO) IVPB 400 mg  Status:  Discontinued     400 mg 200 mL/hr over 60 Minutes Intravenous Every 12 hours 08/07/18 0321 08/09/18 1514   08/07/18 0300  metroNIDAZOLE (FLAGYL) IVPB 500 mg  Status:  Discontinued     500 mg 100 mL/hr over 60 Minutes Intravenous Every 8 hours 08/07/18 0253 08/09/18 1514   08/07/18 0300  ciprofloxacin (CIPRO) IVPB 400 mg     400 mg 200 mL/hr over 60 Minutes Intravenous  Once 08/07/18 0258 08/07/18 0415      Subjective:   Alexander Fry seen and examined today.  These have abdominal pain and feeling worse than yesterday.  Also states he had pills noted in his bowel movement.  Discussed with nurse, stated they looked more like remnants of capsules.  Currently denies chest pain, shortness of breath, nausea or vomiting.  Currently eating breakfast.  Feels that the oxycodone does not help him and only thing that helps him is IV Dilaudid.  Also continues to complain of abdominal distention.  Objective:   Vitals:   08/11/18 2011 08/12/18 0422 08/12/18 2011 08/13/18 0451  BP: (!) 155/86 (!) 152/90 (!) 141/81 139/79  Pulse: 86 82 82 74  Resp: '18 16 18 16  ' Temp: 98.5 F (36.9 C) 98.5 F (36.9 C) 98.4 F (36.9 C) 98 F (36.7 C)  TempSrc: Oral Oral Oral Oral  SpO2: 99% 99% 97% 95%  Weight:  98.4 kg  100.2 kg  Height:        Intake/Output Summary (Last 24 hours) at 08/13/2018 1252 Last data filed at 08/13/2018 1000 Gross per 24 hour  Intake 1920 ml  Output 0 ml  Net 1920 ml   Filed Weights   08/11/18 0353 08/12/18 0422 08/13/18 0451  Weight: 97.7 kg 98.4 kg 100.2 kg   Exam  General: Well developed, well nourished, NAD, appears stated age  44: NCAT, mucous membranes moist.   Neck: Supple  Cardiovascular: S1 S2 auscultated, no murmurs, RRR  Respiratory: Clear to auscultation bilaterally with equal chest rise  Abdomen: Soft, nontender, +distended, + bowel  sounds  MSK: R flank fullness and TTP  Extremities: warm dry without cyanosis clubbing or edema  Neuro: AAOx3, nonfocal   Psych: Normal affect and demeanor  Data Reviewed: I have personally reviewed following labs and imaging studies  CBC: Recent Labs  Lab 08/06/18 2343 08/07/18 0448 08/10/18 0420 08/12/18 0412 08/13/18 0352 08/13/18 1005  WBC 6.5 6.0 6.6 7.9 7.1  --   NEUTROABS 5.2  --   --   --   --   --   HGB 12.4* 12.0* 10.9* 11.1* 9.9* 10.6*  HCT 38.4* 37.4* 34.1* 34.2* 30.9* 33.1*  MCV 75.7* 76.3* 76.5* 76.7* 77.1*  --   PLT 248 224 262 370 292  --    Basic Metabolic Panel: Recent Labs  Lab 08/09/18 0352 08/09/18 1114 08/10/18 0420 08/11/18 0337 08/12/18 0412 08/13/18 0352  NA 138  --  138 139 138 137  K 3.2*  --  3.1* 3.1* 3.8 3.4*  CL 101  --  101 102 102 103  CO2 27  --  '27 29 27 28  ' GLUCOSE 129*  --  149* 117* 134* 130*  BUN <5*  --  <5* <5* <5* 5*  CREATININE 0.34*  --  0.34* 0.36* 0.30* 0.38*  CALCIUM 8.5*  --  8.5* 8.3* 8.7* 8.4*  MG  --  2.0  --  1.9  --   --    GFR: Estimated Creatinine Clearance: 123.3 mL/min (A) (by C-G formula based on SCr of 0.38 mg/dL (L)). Liver Function Tests: Recent Labs  Lab 08/08/18 1205 08/09/18 0352 08/10/18 0420 08/11/18 0337 08/13/18 0352  AST 198* 173* 142* 157* 148*  ALT 188* 167* 146* 140* 125*  ALKPHOS 917* 876* 878* 916* 1,020*  BILITOT 7.9* 8.5* 9.6* 10.1* 11.8*  PROT 5.9* 5.7* 5.6* 5.2* 5.0*  ALBUMIN 2.6* 2.4* 2.4* 2.2* 2.2*   Recent Labs  Lab 08/07/18 0448 08/09/18 1114 08/09/18 1257 08/11/18 0337 08/13/18 0352  LIPASE 572* 185* 180* 139* 113*   No results for input(s): AMMONIA in the last 168 hours. Coagulation Profile: No results for input(s): INR, PROTIME in the last 168 hours. Cardiac Enzymes: No results for input(s): CKTOTAL, CKMB, CKMBINDEX, TROPONINI in the last 168 hours. BNP (last 3 results) No results for input(s): PROBNP in the last 8760 hours. HbA1C: No results for  input(s): HGBA1C in the last 72 hours. CBG: Recent Labs  Lab 08/12/18 2008 08/13/18 0011 08/13/18 0448 08/13/18 0804 08/13/18 1148  GLUCAP 129* 119* 126* 123* 119*   Lipid Profile: No results for input(s): CHOL, HDL, LDLCALC, TRIG, CHOLHDL, LDLDIRECT in the last 72 hours. Thyroid Function Tests: No results for input(s): TSH, T4TOTAL, FREET4, T3FREE, THYROIDAB in the last 72 hours. Anemia Panel: No results for input(s): VITAMINB12, FOLATE, FERRITIN, TIBC, IRON, RETICCTPCT in the last 72 hours. Urine analysis:    Component Value Date/Time   COLORURINE AMBER (A) 08/01/2018 1107   APPEARANCEUR CLEAR 08/01/2018 1107   LABSPEC 1.021 08/01/2018 1107   PHURINE 5.0 08/01/2018 1107   GLUCOSEU 50 (A) 08/01/2018 1107   HGBUR SMALL (A) 08/01/2018 1107   BILIRUBINUR MODERATE (A) 08/01/2018 1107   KETONESUR 80 (A) 08/01/2018 1107   PROTEINUR 30 (A) 08/01/2018 1107   NITRITE NEGATIVE 08/01/2018 1107   LEUKOCYTESUR SMALL (A) 08/01/2018 1107   Sepsis Labs: '@LABRCNTIP' (procalcitonin:4,lacticidven:4)  )No results found for this or any previous visit (from the past 240 hour(s)).    Radiology Studies: No results found.   Scheduled Meds: . diclofenac sodium  2 g Topical QID  . docusate sodium  100 mg Oral BID  . feeding supplement  1 Container Oral TID BM  . fentaNYL  12.5 mcg Transdermal Q72H  . insulin aspart  0-9 Units Subcutaneous Q4H  . linaclotide  145 mcg Oral QAC breakfast  . metoCLOPramide  10 mg Oral QID  . naloxegol oxalate  25 mg Oral Daily  . pantoprazole  40 mg Oral BID  . peg 3350 powder  0.5 kit Oral Once  . polyethylene glycol  34 g Oral TID  . rivaroxaban  20 mg Oral Q supper  . sucralfate  1 g Oral TID WC & HS   Continuous Infusions: . sodium chloride Stopped (08/10/18 1357)  . dextrose 5 % and 0.9% NaCl 75 mL/hr at 08/13/18 0415     LOS: 6 days   Time Spent in minutes   45 minutes (greater than 50% of time spent with patient face to face, as well as  reviewing records, calling consults, and formulating a plan)  Cristal Ford D.O. on 08/13/2018 at 12:52 PM  Between 7am to 7pm - Please see pager noted on [email or web address]  After 7pm go to [email or web address]  And look for the night coverage person covering for me after hours  [facility] Office  [phone]

## 2018-08-13 NOTE — Progress Notes (Signed)
Pt called RN to come to room urgently to check BM that patient just had. Patient stated that it "looks like there are pills in my poop." Two RN's went to assess patient's concern. There were three small round pieces in patient's BM that looked oval. RN did take out and examine. If it was a medication, it looks like it would be some kind of capsule. RN explained to patient that no tablet would look like that and when assessing patient's medications, he only takes 1 capsule during the day around 10. The oval pieces looked like a piece of meat almost that wasn't fully digested. RN put hat in patient's room for further BM to monitor. Will continue to monitor patient and update MD on any new findings.

## 2018-08-14 ENCOUNTER — Inpatient Hospital Stay (HOSPITAL_COMMUNITY): Payer: BLUE CROSS/BLUE SHIELD

## 2018-08-14 DIAGNOSIS — C649 Malignant neoplasm of unspecified kidney, except renal pelvis: Secondary | ICD-10-CM

## 2018-08-14 DIAGNOSIS — C799 Secondary malignant neoplasm of unspecified site: Secondary | ICD-10-CM

## 2018-08-14 LAB — HEPARIN LEVEL (UNFRACTIONATED): Heparin Unfractionated: 1.06 IU/mL — ABNORMAL HIGH (ref 0.30–0.70)

## 2018-08-14 LAB — COMPREHENSIVE METABOLIC PANEL
ALK PHOS: 1081 U/L — AB (ref 38–126)
ALT: 116 U/L — AB (ref 0–44)
AST: 127 U/L — AB (ref 15–41)
Albumin: 2.1 g/dL — ABNORMAL LOW (ref 3.5–5.0)
Anion gap: 10 (ref 5–15)
BILIRUBIN TOTAL: 14.7 mg/dL — AB (ref 0.3–1.2)
CHLORIDE: 100 mmol/L (ref 98–111)
CO2: 27 mmol/L (ref 22–32)
Calcium: 8.6 mg/dL — ABNORMAL LOW (ref 8.9–10.3)
Glucose, Bld: 115 mg/dL — ABNORMAL HIGH (ref 70–99)
Potassium: 3.5 mmol/L (ref 3.5–5.1)
Sodium: 137 mmol/L (ref 135–145)
TOTAL PROTEIN: 5.2 g/dL — AB (ref 6.5–8.1)

## 2018-08-14 LAB — GLUCOSE, CAPILLARY
GLUCOSE-CAPILLARY: 129 mg/dL — AB (ref 70–99)
GLUCOSE-CAPILLARY: 115 mg/dL — AB (ref 70–99)
Glucose-Capillary: 110 mg/dL — ABNORMAL HIGH (ref 70–99)
Glucose-Capillary: 115 mg/dL — ABNORMAL HIGH (ref 70–99)
Glucose-Capillary: 116 mg/dL — ABNORMAL HIGH (ref 70–99)
Glucose-Capillary: 123 mg/dL — ABNORMAL HIGH (ref 70–99)

## 2018-08-14 LAB — CBC
HCT: 31.8 % — ABNORMAL LOW (ref 39.0–52.0)
Hemoglobin: 10.3 g/dL — ABNORMAL LOW (ref 13.0–17.0)
MCH: 25 pg — ABNORMAL LOW (ref 26.0–34.0)
MCHC: 32.4 g/dL (ref 30.0–36.0)
MCV: 77.2 fL — AB (ref 78.0–100.0)
PLATELETS: 360 10*3/uL (ref 150–400)
RBC: 4.12 MIL/uL — ABNORMAL LOW (ref 4.22–5.81)
RDW: 20.2 % — AB (ref 11.5–15.5)
WBC: 9.2 10*3/uL (ref 4.0–10.5)

## 2018-08-14 LAB — APTT: aPTT: 36 seconds (ref 24–36)

## 2018-08-14 MED ORDER — HEPARIN (PORCINE) IN NACL 100-0.45 UNIT/ML-% IJ SOLN
1400.0000 [IU]/h | INTRAMUSCULAR | Status: DC
  Administered 2018-08-14: 1500 [IU]/h via INTRAVENOUS
  Administered 2018-08-15 – 2018-08-16 (×3): 1400 [IU]/h via INTRAVENOUS
  Filled 2018-08-14 (×4): qty 250

## 2018-08-14 MED ORDER — GADOBUTROL 1 MMOL/ML IV SOLN
10.0000 mL | Freq: Once | INTRAVENOUS | Status: AC | PRN
  Administered 2018-08-14: 10 mL via INTRAVENOUS

## 2018-08-14 MED ORDER — FUROSEMIDE 10 MG/ML IJ SOLN
40.0000 mg | Freq: Once | INTRAMUSCULAR | Status: AC
  Administered 2018-08-14: 40 mg via INTRAVENOUS

## 2018-08-14 MED ORDER — FUROSEMIDE 10 MG/ML IJ SOLN
40.0000 mg | Freq: Once | INTRAMUSCULAR | Status: DC
  Filled 2018-08-14: qty 4

## 2018-08-14 MED ORDER — HEPARIN SOD (PORK) LOCK FLUSH 100 UNIT/ML IV SOLN
INTRAVENOUS | Status: AC
  Filled 2018-08-14: qty 5

## 2018-08-14 MED ORDER — FENTANYL 25 MCG/HR TD PT72
25.0000 ug | MEDICATED_PATCH | TRANSDERMAL | Status: DC
  Administered 2018-08-14 – 2018-08-17 (×2): 25 ug via TRANSDERMAL
  Filled 2018-08-14 (×3): qty 1

## 2018-08-14 MED ORDER — LIDOCAINE-PRILOCAINE 2.5-2.5 % EX CREA
TOPICAL_CREAM | Freq: Once | CUTANEOUS | Status: AC
  Administered 2018-08-14: 15:00:00 via TOPICAL
  Filled 2018-08-14: qty 5

## 2018-08-14 NOTE — Care Management Note (Signed)
Case Management Note  Patient Details  Name: Alexander Fry MRN: [id number] Date of Birth: Mar 12, 1961  Subjective/Objective: Acute pancreatitis. Readmit. From home.Hx: Renal Cell Ca. Oncology following.Has pcp, pharmacy,support,transp. Noted not a surgical candidate. MRCP.No CM needs.                   Action/Plan:d/c home.   Expected Discharge Date:  08/10/18               Expected Discharge Plan:  Home/Self Care  In-House Referral:     Discharge planning Services  CM Consult  Post Acute Care Choice:    Choice offered to:     DME Arranged:    DME Agency:     HH Arranged:    HH Agency:     Status of Service:  In process, will continue to follow  If discussed at Long Length of Stay Meetings, dates discussed:    Additional Comments:  Dessa Phi, RN 08/14/2018, 12:15 PM

## 2018-08-14 NOTE — Progress Notes (Signed)
I was called by Dr Ree Kida to discuss possible cholecystectomy.  Pt is not a surgical candidate until duodenitis has resolved.  Currently, his risk of duodenal injury is very high, if cholecystectomy is attempted.  If he has a duodenal injury, it will almost surely be fatal.  Due to these very high risks, I have recommended conservative management.  If you are concerned about cholecystitis, a HIDA scan may be obtained and if positive, a cholecystostomy tube can be placed.  If you are concerned about biliary obstruction, a MRCP and/or ERCP can be performed.  We have nothing further to offer this patient at this moment.      Rosario Adie, MD  [facility]

## 2018-08-14 NOTE — Progress Notes (Signed)
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 Feraheme [Ferumoxytol] Anaphylaxis and Other (See Comments)    Flush. Patient denies anaphylaxis.    Patient Measurements: Height: 6' (182.9 cm) Weight: 222 lb 4.8 oz (100.8 kg) IBW/kg (Calculated) : 77.6 Heparin Dosing Weight: 97 kg  Vital Signs: Temp: 98.7 F (37.1 C) (09/09 0453) Temp Source: Oral (09/09 0453) BP: 131/79 (09/09 0453) Pulse Rate: 83 (09/09 0453)  Labs: Recent Labs    08/12/18 0412 08/13/18 0352 08/13/18 1005 08/14/18 0638  HGB 11.1* 9.9* 10.6* 10.3*  HCT 34.2* 30.9* 33.1* 31.8*  PLT 370 292  --  360  CREATININE 0.30* 0.38*  --  <0.30*    CrCl cannot be calculated (This lab value cannot be used to calculate CrCl because it is not a number: <0.30).   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 GI is currently seeing patient for duodentitis/duodenal ulcers (does not suspect pt has pancreatis).  Xarelto was resumed on 9/2.  Per Dr. Ennever's request, To transition patient to heparin drip on 9/9.  Today, 08/14/2018: - AST/ALT elevated but trending down (127/116) - Alk phos elevated, TBili trending up (14.7) - Pt's child pugh score is C.  Of note, Xarelto should ve avoided in pts with child pugh score of B or C. - last dose of xarellto 20 mg given on 08/14/18 at 1617  Goal of Therapy:  Heparin level 0.3-0.7 units/ml aPTT 66-102 seconds Monitor platelets by anticoagulation protocol: Yes   Plan:  - start heparin drip at 1500 units/hr at 4PM today (no bolus) - baseline aPTT and Heparin level at 3PM - check aPTT and heparin level 6 hrs after starting heparin drip -monitor for s/s bleeding  Pham, Anh P 08/14/2018,8:43 AM  

## 2018-08-14 NOTE — Progress Notes (Addendum)
PROGRESS NOTE    Lloyd Ayo  HUT:[id number] DOB: 06-08-1961 DOA: 08/06/2018 PCP: [name], PA-C   Brief Narrative:  HPI On 08/07/2018 by Dr. [name] Alexander Fry  is a 57 y.o. male, w hx of metastatic renal cell carcinoma (bone, brain, lung), h/o R IJ thrombus, Iron deficiency anemia,  w recent admission for abdominal pain where he was found to have duodenitis.  Pt apparently went home and continued to have abdominal pain which was worse yesterday associated with some n/v.  Pt denies fever, chills, diarrhea, brbpr, black stool.  Pt has been taking protonix.  Denies NSAIDS.    Interim history Admitted for acute pancreatitis.  Gastroenterology consulted and appreciated.  Continues to have abdominal pain and is requiring both IV as well as oral pain medications quite frequently.  Assessment & Plan   Acute pancreatitis -CT abdomen pelvis shows redemonstration of transmural thickening of the second and third portion of the duodenum suggestive of duodenitis, some interval decrease. -Patient continues to need both IV Dilaudid and oral oxycodone frequently. -Triglycerides 73; lipase on admission 969-down to 113 -Gastroenterology consulted and appreciated recommended conservative management -General surgery consulted and appreciated, felt patient is a poor surgical candidate -Discussed with Dr. Benson Norway, gastroenterology, recommended obtaining KUB, wonders if some of patient's abdominal pain could be narcotic related constipation.  Recommended trying Linzess as well as Fleet enema- given on 9/4. -CT abd: possibility of duodenitis, peripancreatic fluid, pancreatitis not excluded   Abdominal pain -?secondary to the above vs constipation vs referred from renal cell -continue IV dilaudid, oxycodone -continues to have pain today, will increase fentanyl patch -Continue toradol for anti-inflammatory properties  -Oncology, Dr. Marin Olp, feels this is related to the gallbladder and feels the  gallbladder needs to be removed as patient's bilirubin continues to rise -Discussed with surgery, Dr. Marcello Moores, feels patient would be high risk for surgery given the inflammation.  Feels inflammation will likely take months to subside.  Concerned that if cholecystectomy is attempted there could be duodenal injury which would be fatal.  She continues to recommend conservative management at this time. -Unfortunately HIDA scan cannot be done as patient cannot come off of narcotic medications at this time.  This was also discussed with Dr. Benson Norway via phone. -MRCP ordered.  Constipation -Though patient has not had any solid intake in many days, suspect medication induced.  Few bowel sounds noted on exam. -Abdominal x-ray on 08/06/2018 showed moderate volume of stool in the ascending colon -Discussed with Dr. Benson Norway today. Patient was given an enema on 9/4, with minimal BM yesterday. Was started on linzess. Dr. Benson Norway recommended repeat CT scan today.  -Continue MiraLAX 3 times daily, movanti, carafate, reglan -Encouraged patient to get out of bed and walk around -patient has had several bowel movements  Duodenitis -Completed course of Cipro and Flagyl -Continue Protonix -CT abdomen pelvis as above  Abnormal LFTs -Trending downward very slowly -Hepatitis panel unremarkable  Hypokalemia -potassium 3.5, will continue to replace, goal of 4 -magnesium was checked, 1.9  Diabetes mellitus, type II -Continue insulin sliding scale with CBG monitoring  Metastatic renal cell carcinoma -Follow-up with oncology as an outpatient -Possibly elevation in alk phos due to metastatic bone disease -Discussed with Dr. Marin Olp- if abdominal pain is referred from cancer, may need nephrectomy- recommended urology consult -discussed with patient, he feels he would like to wait on urology consult and possibly pursue this as an outpatient following a PET scan  Chronic normocytic anemia -Appears to be stable, currently  10.3  Right IJ thrombus -Continue Xarelto  Lower extremity edema -suspect from abdominal pathology and venous insuffiencey -Do not feel Lasix is the appropriate treatment -Feel patient to keep his legs elevated and ambulate when possible -Ted hose ordered  DVT Prophylaxis  xarelto  Code Status: Full  Family Communication: None at bedside  Disposition Plan: Admitted.  Pending improvement in abdominal pain and MRCP.   Consultants Gastroenterology General surgery Oncology  Procedures  none  Antibiotics   Anti-infectives (From admission, onward)   Start     Dose/Rate Route Frequency Ordered Stop   08/07/18 1800  ciprofloxacin (CIPRO) IVPB 400 mg  Status:  Discontinued     400 mg 200 mL/hr over 60 Minutes Intravenous Every 12 hours 08/07/18 0321 08/09/18 1514   08/07/18 0300  metroNIDAZOLE (FLAGYL) IVPB 500 mg  Status:  Discontinued     500 mg 100 mL/hr over 60 Minutes Intravenous Every 8 hours 08/07/18 0253 08/09/18 1514   08/07/18 0300  ciprofloxacin (CIPRO) IVPB 400 mg     400 mg 200 mL/hr over 60 Minutes Intravenous  Once 08/07/18 0258 08/07/18 0415      Subjective:   Phi Avans seen and examined today.  He is to have abdominal pain and back pain.  Able to eat.  Having bowel movements.  Continues to have abdominal bloating.  Denies chest pain, shortness of breath, nausea or vomiting.  Complains of lower extremity swelling.  Objective:   Vitals:   08/13/18 0451 08/13/18 1341 08/13/18 2016 08/14/18 0453  BP: 139/79 130/82 (!) 142/78 131/79  Pulse: 74 79 85 83  Resp: _0 Temp: 98 F (36.7 C) 99.3 F (37.4 C) 97.9 F (36.6 C) 98.7 F (37.1 C)  TempSrc: Oral Oral Oral Oral  SpO2: 95% 96% 98% 98%  Weight: 100.2 kg   100.8 kg  Height:        Intake/Output Summary (Last 24 hours) at 08/14/2018 1040 Last data filed at 08/14/2018 0953 Gross per 24 hour  Intake 300 ml  Output 1100 ml  Net -800 ml   Filed Weights   08/12/18 0422 08/13/18 0451 08/14/18  0453  Weight: 98.4 kg 100.2 kg 100.8 kg   Exam  General: Well developed, well nourished, NAD, appears stated age  HEENT: NCAT, mucous membranes moist.   Neck: Supple  Cardiovascular: S1 S2 auscultated, no murmurs, RRR  Respiratory: Clear to auscultation bilaterally with equal chest rise  Abdomen: Soft, nontender, nondistended, + bowel sounds  MSK: Right flank fullness  Extremities: warm dry without cyanosis clubbing. LE edema B/L   Neuro: AAOx3, nonfocal  Psych: Normal affect and demeanor  Data Reviewed: I have personally reviewed following labs and imaging studies  CBC: Recent Labs  Lab 08/10/18 0420 08/12/18 0412 08/13/18 0352 08/13/18 1005 08/14/18 0638  WBC 6.6 7.9 7.1  --  9.2  HGB 10.9* 11.1* 9.9* 10.6* 10.3*  HCT 34.1* 34.2* 30.9* 33.1* 31.8*  MCV 76.5* 76.7* 77.1*  --  77.2*  PLT 262 370 292  --  536   Basic Metabolic Panel: Recent Labs  Lab 08/09/18 1114 08/10/18 0420 08/11/18 0337 08/12/18 0412 08/13/18 0352 08/14/18 0638  NA  --  138 139 138 137 137  K  --  3.1* 3.1* 3.8 3.4* 3.5  CL  --  101 102 102 103 100  CO2  --  _1 GLUCOSE  --  149* 117* 134* 130* 115*  BUN  --  <5* <5* <  5* 5* <5*  CREATININE  --  0.34* 0.36* 0.30* 0.38* <0.30*  CALCIUM  --  8.5* 8.3* 8.7* 8.4* 8.6*  MG 2.0  --  1.9  --   --   --    GFR: CrCl cannot be calculated (This lab value cannot be used to calculate CrCl because it is not a number: <0.30). Liver Function Tests: Recent Labs  Lab 08/09/18 0352 08/10/18 0420 08/11/18 0337 08/13/18 0352 08/14/18 0638  AST 173* 142* 157* 148* 127*  ALT 167* 146* 140* 125* 116*  ALKPHOS 876* 878* 916* 1,020* 1,081*  BILITOT 8.5* 9.6* 10.1* 11.8* 14.7*  PROT 5.7* 5.6* 5.2* 5.0* 5.2*  ALBUMIN 2.4* 2.4* 2.2* 2.2* 2.1*   Recent Labs  Lab 08/09/18 1114 08/09/18 1257 08/11/18 0337 08/13/18 0352  LIPASE 185* 180* 139* 113*   No results for input(s): AMMONIA in the last 168 hours. Coagulation Profile: No  results for input(s): INR, PROTIME in the last 168 hours. Cardiac Enzymes: No results for input(s): CKTOTAL, CKMB, CKMBINDEX, TROPONINI in the last 168 hours. BNP (last 3 results) No results for input(s): PROBNP in the last 8760 hours. HbA1C: No results for input(s): HGBA1C in the last 72 hours. CBG: Recent Labs  Lab 08/13/18 1615 08/13/18 2013 08/14/18 0004 08/14/18 0449 08/14/18 0736  GLUCAP 140* 116* 110* 123* 129*   Lipid Profile: No results for input(s): CHOL, HDL, LDLCALC, TRIG, CHOLHDL, LDLDIRECT in the last 72 hours. Thyroid Function Tests: No results for input(s): TSH, T4TOTAL, FREET4, T3FREE, THYROIDAB in the last 72 hours. Anemia Panel: No results for input(s): VITAMINB12, FOLATE, FERRITIN, TIBC, IRON, RETICCTPCT in the last 72 hours. Urine analysis:    Component Value Date/Time   COLORURINE AMBER (A) 08/01/2018 1107   APPEARANCEUR CLEAR 08/01/2018 1107   LABSPEC 1.021 08/01/2018 1107   PHURINE 5.0 08/01/2018 1107   GLUCOSEU 50 (A) 08/01/2018 1107   HGBUR SMALL (A) 08/01/2018 1107   BILIRUBINUR MODERATE (A) 08/01/2018 1107   KETONESUR 80 (A) 08/01/2018 1107   PROTEINUR 30 (A) 08/01/2018 1107   NITRITE NEGATIVE 08/01/2018 1107   LEUKOCYTESUR SMALL (A) 08/01/2018 1107   Sepsis Labs: _0 (procalcitonin:4,lacticidven:4)  )No results found for this or any previous visit (from the past 240 hour(s)).    Radiology Studies: No results found.   Scheduled Meds: . diclofenac sodium  2 g Topical QID  . docusate sodium  100 mg Oral BID  . feeding supplement  1 Container Oral TID BM  . fentaNYL  25 mcg Transdermal Q72H  . insulin aspart  0-9 Units Subcutaneous Q4H  . linaclotide  145 mcg Oral QAC breakfast  . metoCLOPramide  10 mg Oral QID  . naloxegol oxalate  25 mg Oral Daily  . pantoprazole  40 mg Oral BID  . peg 3350 powder  0.5 kit Oral Once  . polyethylene glycol  34 g Oral TID  . sucralfate  1 g Oral TID WC & HS   Continuous Infusions: . sodium  chloride Stopped (08/10/18 1357)  . dextrose 5 % and 0.9% NaCl 10 mL/hr at 08/14/18 1025     LOS: 7 days   Time Spent in minutes   45 minutes (greater than 50% of time spent with patient face to face, as well as reviewing records, calling consults, and formulating a plan)  Cristal Ford D.O. on 08/14/2018 at 10:40 AM  Between 7am to 7pm - Please see pager noted on [email or web address]  After 7pm go to [email or web address]  And look for the night coverage  person covering for me after hours  [facility] Office  [phone]

## 2018-08-14 NOTE — Progress Notes (Signed)
Pt to MRI. MD made aware earlier and Heparin Drip stopped for this procedure. Pharmacy also updated that Heparin Drip stopped for MRI at 1830

## 2018-08-14 NOTE — Consult Note (Signed)
Referral MD  Reason for Referral: Metastatic kidney cancer; severe abdominal pain; worsening hyperbilirubinemia  Chief Complaint  Patient presents with  . Abdominal Pain  . Constipation  : My abdominal pain came back.  HPI: Alexander Fry is well-known to me.  Is a 57 year old white male.  He was just in the hospital for abdominal pain.  He is found to have duodenitis which was thought to be a reflection of the Afinitor that he was on.  He was discharged.  Unfortunate, came back a few days later with worsening abdominal pain.  He was not going to the bathroom.  He was constipated.  When he was initially in the hospital, his bilirubin and LFTs were elevated.  They are coming down nicely.  Now, they are going up quickly.  He had a CT of the abdomen pelvis on 08/10/2018.  This showed some more thickening of the second and third portion of the duodenum.  He had stable retroperitoneal and retrocrural adenopathy.  He has gallstones.  He has the similar appearing right renal mass.  His liver tests are going up quickly.  His bilirubin yesterday was  11.8.  His SGPT is 125; SGOT 148.  Alkaline phosphatase 1000.  Today, his white cell count is 9.2.  Hemoglobin 10.3.  Platelet count 360,000.  I think that part part of his problem is that he probably has gallstones.  I really think that the gallbladder needs to come out.  I am sure surgery will have a difficult time agreeing to this given that he has metastatic kidney cancer.  However, since his liver looks okay without any hepatic mets, something has to be causing this rapidly rising bilirubin level.  When he was discharged from the hospital the first time back in late August, his bilirubin was 1.9.  He is gaining a lot of weight.  I think this is fluid weight.  He is on a ton of medication to go to the bathroom.  There is no fever.  He is just on liquids right now.  He has a lot of swelling in his legs.  I am sure this is probably from fluids that he is  getting.  He has had no cough or shortness of breath.  Overall, his performance status is ECOG 1.    Past Medical History:  Diagnosis Date  . Bilateral primary osteoarthritis of knee 04/03/2018  . Duodenitis   . Goals of care, counseling/discussion 12/22/2016  . History of radiation therapy 02/11/2017   SRT right posterior frontal 12 mm target 20 Gy, Right anterior frontal 48m 20 Gy  . History of radiation therapy 02/21/2017   SRT Right post parietal 469mtarget 20 Gy, left Post parietal 70m38m0 Gy, Right Occipital 4 mm 20Gy, Left Occipital 4 mm 20 Gy  . History of radiation therapy 02/25/2017, 02/28/2017, 03/02/2017   SRT L1 spine 27 Gy 3 fractions, L4 Spine 27 Gy 3 fractions  . History of radiation therapy 06/07/2017   SRS- Brain  . History of radiation therapy 04/03/2018   Right ilium, 8 Gy in 1 fraction for a total dose of 8 Gy  . Inguinal hernia of left side without obstruction or gangrene   . Iron deficiency anemia due to chronic blood loss 05/23/2018  . Pneumonia    left lung  . [place]  . 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 His vision has "spots" at times  :  Past  Surgical History:  Procedure Laterality Date  . BIOPSY  08/02/2018   Procedure: BIOPSY;  Surgeon: Juanita Craver, MD;  Location: [facility];  Service: Endoscopy;;  . ESOPHAGOGASTRODUODENOSCOPY N/A 08/02/2018   Procedure: ESOPHAGOGASTRODUODENOSCOPY (EGD);  Surgeon: Juanita Craver, MD;  Location: [facility];  Service: Endoscopy;  Laterality: N/A;  . HERNIA REPAIR    . INGUINAL HERNIA REPAIR Left   . IR FLUORO GUIDE PORT INSERTION RIGHT  02/06/2018  . IR US GUIDE VASC ACCESS RIGHT  02/06/2018  :   Current Facility-Administered Medications:  .  0.9 %  sodium chloride infusion, , Intravenous, PRN, Cristal Ford, DO, Stopped at 08/10/18 1357 .  acetaminophen (TYLENOL) tablet 650 mg, 650 mg, Oral, Q6H PRN, 650 mg at 08/07/18 2245 **OR** acetaminophen (TYLENOL)  suppository 650 mg, 650 mg, Rectal, Q6H PRN, [name], MD .  dextrose 5 %-0.9 % sodium chloride infusion, , Intravenous, Continuous, Jazlyn Tippens, Rudell Cobb, MD, Last Rate: 75 mL/hr at 08/14/18 0509 .  diclofenac sodium (VOLTAREN) 1 % transdermal gel 2 g, 2 g, Topical, QID, Mikhail, [place], DO, 2 g at 08/13/18 2147 .  docusate sodium (COLACE) capsule 100 mg, 100 mg, Oral, BID, [name], MD, 100 mg at 08/13/18 2146 .  feeding supplement (BOOST / RESOURCE BREEZE) liquid 1 Container, 1 Container, Oral, TID BM, Nelida Meuse III, MD, 1 Container at 08/13/18 1405 .  fentaNYL (DURAGESIC - dosed mcg/hr) 12.5 mcg, 12.5 mcg, Transdermal, Q72H, Mikhail, [place], DO, 12.5 mcg at 08/13/18 0946 .  furosemide (LASIX) injection 40 mg, 40 mg, Intramuscular, Once, Itzelle Gains, Rudell Cobb, MD .  HYDROmorphone (DILAUDID) injection 1 mg, 1 mg, Intravenous, Q2H PRN, Charlynne Cousins, MD, 1 mg at 08/14/18 [phone] .  insulin aspart (novoLOG) injection 0-9 Units, 0-9 Units, Subcutaneous, Q4H, [place], Karsten Fells, NP, 1 Units at 08/14/18 0503 .  linaclotide (LINZESS) capsule 145 mcg, 145 mcg, Oral, QAC breakfast, Cristal Ford, DO, 145 mcg at 08/13/18 7673 .  metoCLOPramide (REGLAN) tablet 10 mg, 10 mg, Oral, QID, [name], MD, 10 mg at 08/13/18 2146 .  naloxegol oxalate (MOVANTIK) tablet 25 mg, 25 mg, Oral, Daily, Juanita Craver, MD, 25 mg at 08/13/18 0918 .  ondansetron (ZOFRAN) injection 4 mg, 4 mg, Intravenous, Q6H PRN, [name], MD, 4 mg at 08/14/18 0503 .  pantoprazole (PROTONIX) EC tablet 40 mg, 40 mg, Oral, BID, [name], MD, 40 mg at 08/13/18 2146 .  peg 3350 powder (MOVIPREP) kit 100 g, 0.5 kit, Oral, Once, Cristal Ford, DO, Stopped at 08/13/18 1134 .  polyethylene glycol (MIRALAX / GLYCOLAX) packet 34 g, 34 g, Oral, TID, Juanita Craver, MD, 34 g at 08/13/18 2146 .  sodium chloride flush (NS) 0.9 % injection 10-40 mL, 10-40 mL, Intracatheter, PRN, Charlynne Cousins, MD, 10 mL at 08/11/18 0337 .  sucralfate  (CARAFATE) tablet 1 g, 1 g, Oral, TID WC & HS, Carol Ada, MD, 1 g at 08/13/18 2146:  . diclofenac sodium  2 g Topical QID  . docusate sodium  100 mg Oral BID  . feeding supplement  1 Container Oral TID BM  . fentaNYL  12.5 mcg Transdermal Q72H  . furosemide  40 mg Intramuscular Once  . insulin aspart  0-9 Units Subcutaneous Q4H  . linaclotide  145 mcg Oral QAC breakfast  . metoCLOPramide  10 mg Oral QID  . naloxegol oxalate  25 mg Oral Daily  . pantoprazole  40 mg Oral BID  . peg 3350 powder  0.5 kit Oral Once  .  polyethylene glycol  34 g Oral TID  . sucralfate  1 g Oral TID WC & HS  :  Allergies  Allergen Reactions  . Feraheme [Ferumoxytol] Anaphylaxis and Other (See Comments)    Flush. Patient denies anaphylaxis.  :  Family History  Problem Relation Age of Onset  . Heart disease Mother   . Diabetes Mother   . Heart disease Father   . Alcohol abuse Brother   :  Social History   Socioeconomic History  . Marital status: Single    Spouse name: Not on file  . Number of children: Not on file  . Years of education: Not on file  . Highest education level: Not on file  Occupational History  . Not on file  Social Needs  . Financial resource strain: Not on file  . Food insecurity:    Worry: Not on file    Inability: Not on file  . Transportation needs:    Medical: Not on file    Non-medical: Not on file  Tobacco Use  . Smoking status: Never Smoker  . Smokeless tobacco: Never Used  Substance and Sexual Activity  . Alcohol use: Yes    Comment: social use in the past  . Drug use: No  . Sexual activity: Not Currently  Lifestyle  . Physical activity:    Days per week: Not on file    Minutes per session: Not on file  . Stress: Not on file  Relationships  . Social connections:    Talks on phone: Not on file    Gets together: Not on file    Attends religious service: Not on file    Active member of club or organization: Not on file    Attends meetings of clubs  or organizations: Not on file    Relationship status: Not on file  . Intimate partner violence:    Fear of current or ex partner: Not on file    Emotionally abused: Not on file    Physically abused: Not on file    Forced sexual activity: Not on file  Other Topics Concern  . Not on file  Social History Narrative  . Not on file  :  Pertinent items are noted in HPI.  Exam: Patient Vitals for the past 24 hrs:  BP Temp Temp src Pulse Resp SpO2 Weight  08/14/18 0453 131/79 98.7 F (37.1 C) Oral 83 18 98 % 222 lb 4.8 oz (100.8 kg)  08/13/18 2016 (!) 142/78 97.9 F (36.6 C) Oral 85 16 98 % -  08/13/18 1341 130/82 99.3 F (37.4 C) Oral 79 20 96 % -     Recent Labs    08/13/18 0352 08/13/18 1005 08/14/18 0638  WBC 7.1  --  9.2  HGB 9.9* 10.6* 10.3*  HCT 30.9* 33.1* 31.8*  PLT 292  --  360   Recent Labs    08/12/18 0412 08/13/18 0352  NA 138 137  K 3.8 3.4*  CL 102 103  CO2 27 28  GLUCOSE 134* 130*  BUN <5* 5*  CREATININE 0.30* 0.38*  CALCIUM 8.7* 8.4*    Blood smear review: None  Pathology: None    Assessment and Plan: Mr. Mah is a 57 year old white male with metastatic renal cell carcinoma.  His problem however is more biliary right now.  I am sure some of the pain that he has could be from the duodenitis.  However, I am more worried about the fact that his bilirubin has been  going up quickly.  On the CAT scan that had done last week, does show gallstones.  I really think that the gallbladder needs to come out.  I would like to hope that this could be done by surgery.  I think have been a cholecystotomy tube and would be a real nuisance and a real inconvenience for the patient since he has a very good quality of life and a good performance status.  The only problem is that he is on Xarelto.  I will get him off Xarelto and put on heparin right now until we know what is going on with surgery.  I would be really, really nice if the primary renal cell tumor could be  resected.  I think is also would help with any pain issues.  Is also may help with respect to his metastatic disease.  Since the renal cells on the right side, it be nice to do a combination procedure.  However, I am sure that urology and surgery would have a lot of issues with doing some like this.  We will follow along.  I appreciate AlexanderChichester's frustration.  He just hates being in the hospital.  He hates having this abdominal pain.  Lattie Haw, MD  [facility] 6:38

## 2018-08-15 DIAGNOSIS — Z7189 Other specified counseling: Secondary | ICD-10-CM

## 2018-08-15 DIAGNOSIS — K8500 Idiopathic acute pancreatitis without necrosis or infection: Secondary | ICD-10-CM

## 2018-08-15 DIAGNOSIS — R52 Pain, unspecified: Secondary | ICD-10-CM

## 2018-08-15 DIAGNOSIS — K859 Acute pancreatitis without necrosis or infection, unspecified: Principal | ICD-10-CM

## 2018-08-15 LAB — COMPREHENSIVE METABOLIC PANEL
ALT: 98 U/L — ABNORMAL HIGH (ref 0–44)
AST: 91 U/L — AB (ref 15–41)
Albumin: 2.2 g/dL — ABNORMAL LOW (ref 3.5–5.0)
Alkaline Phosphatase: 1129 U/L — ABNORMAL HIGH (ref 38–126)
Anion gap: 11 (ref 5–15)
BUN: 7 mg/dL (ref 6–20)
CHLORIDE: 95 mmol/L — AB (ref 98–111)
CO2: 27 mmol/L (ref 22–32)
Calcium: 8.5 mg/dL — ABNORMAL LOW (ref 8.9–10.3)
Creatinine, Ser: 0.39 mg/dL — ABNORMAL LOW (ref 0.61–1.24)
GFR calc Af Amer: >60 mL/min (ref >60)
GFR calc non Af Amer: >60 mL/min (ref >60)
GLUCOSE: 113 mg/dL — AB (ref 70–99)
POTASSIUM: 3.2 mmol/L — AB (ref 3.5–5.1)
Sodium: 133 mmol/L — ABNORMAL LOW (ref 135–145)
Total Bilirubin: 16 mg/dL — ABNORMAL HIGH (ref 0.3–1.2)
Total Protein: 5.6 g/dL — ABNORMAL LOW (ref 6.5–8.1)

## 2018-08-15 LAB — GLUCOSE, CAPILLARY
GLUCOSE-CAPILLARY: 106 mg/dL — AB (ref 70–99)
GLUCOSE-CAPILLARY: 172 mg/dL — AB (ref 70–99)
GLUCOSE-CAPILLARY: 158 mg/dL — AB (ref 70–99)
Glucose-Capillary: 98 mg/dL (ref 70–99)
Glucose-Capillary: 107 mg/dL — ABNORMAL HIGH (ref 70–99)
Glucose-Capillary: 185 mg/dL — ABNORMAL HIGH (ref 70–99)

## 2018-08-15 LAB — CBC
HCT: 32.6 % — ABNORMAL LOW (ref 39.0–52.0)
Hemoglobin: 10.4 g/dL — ABNORMAL LOW (ref 13.0–17.0)
MCH: 24.8 pg — ABNORMAL LOW (ref 26.0–34.0)
MCHC: 31.9 g/dL (ref 30.0–36.0)
MCV: 77.8 fL — AB (ref 78.0–100.0)
PLATELETS: 367 10*3/uL (ref 150–400)
RBC: 4.19 MIL/uL — ABNORMAL LOW (ref 4.22–5.81)
RDW: 20.8 % — AB (ref 11.5–15.5)
WBC: 10.6 10*3/uL — ABNORMAL HIGH (ref 4.0–10.5)

## 2018-08-15 LAB — APTT: aPTT: 117 seconds — ABNORMAL HIGH (ref 24–36)

## 2018-08-15 LAB — LIPASE, BLOOD: LIPASE: 80 U/L — AB (ref 11–51)

## 2018-08-15 LAB — HEPARIN LEVEL (UNFRACTIONATED)
HEPARIN UNFRACTIONATED: 0.55 [IU]/mL (ref 0.30–0.70)
Heparin Unfractionated: 0.76 IU/mL — ABNORMAL HIGH (ref 0.30–0.70)

## 2018-08-15 MED ORDER — KETOROLAC TROMETHAMINE 15 MG/ML IJ SOLN
15.0000 mg | Freq: Four times a day (QID) | INTRAMUSCULAR | Status: AC | PRN
  Administered 2018-08-15 (×2): 15 mg via INTRAVENOUS
  Filled 2018-08-15 (×2): qty 1

## 2018-08-15 MED ORDER — METHYLPREDNISOLONE SODIUM SUCC 125 MG IJ SOLR
80.0000 mg | Freq: Two times a day (BID) | INTRAMUSCULAR | Status: DC
  Administered 2018-08-15 – 2018-08-16 (×4): 80 mg via INTRAVENOUS
  Filled 2018-08-15 (×4): qty 2

## 2018-08-15 MED ORDER — HYDROMORPHONE HCL 1 MG/ML IJ SOLN
1.0000 mg | Freq: Once | INTRAMUSCULAR | Status: AC
  Administered 2018-08-15: 1 mg via INTRAVENOUS
  Filled 2018-08-15: qty 1

## 2018-08-15 MED ORDER — POTASSIUM CHLORIDE CRYS ER 20 MEQ PO TBCR
40.0000 meq | EXTENDED_RELEASE_TABLET | Freq: Once | ORAL | Status: AC
  Administered 2018-08-15: 40 meq via ORAL
  Filled 2018-08-15: qty 2

## 2018-08-15 NOTE — Progress Notes (Signed)
Alexander Fry is feeling better this morning.  He says his pain is doing much better.  Unfortunately, his bilirubin yesterday was 14.8.  This keep going up.  He had a MRCP yesterday.  This showed findings compatible with acute pancreatitis.  He had abnormal increase in caliber of intrahepatic ducts.  He had gallstones and gallbladder wall thickening.  He had the large right renal mass.  There was some ascites.  Wall thickening of the second and third portion of the duodenum.  Surgery left a note saying that they would not operate on him as long as he has duodenitis.  I will start him on some steroids to see if this can help a little bit.  Maybe, steroids can help take down some of the inflammation.  His alkaline phosphatase is 1080.  His SGPT is 116 SGOT 127.  White cell count 9.2.  Hemoglobin 10.3.  He is on heparin infusion right now.  I am not sure what else we can do to help with the elevated bilirubin.  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  Typically, the treatment is to remove the renal mass.  He has had no fever.  He still is not eating more than just clear liquids.  His vital signs show temperature of 98.1.  Pulse 79.  Blood pressure 128/83.  I am not sure where he stands with respect to having bowel movements.  He is on quite a few agents to try to help him go to the bathroom.  At least, he is feeling better.  We will see what his bilirubin is today.  I appreciate all the care that he is getting from everybody on 4 E.  The staff is doing a tremendous job.  Lattie Haw, MD  [facility] 4:2

## 2018-08-15 NOTE — Progress Notes (Signed)
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 Feraheme [Ferumoxytol] Anaphylaxis and Other (See Comments)    Flush. Patient denies anaphylaxis.    Patient Measurements: Height: 6' (182.9 cm) Weight: 222 lb 9.6 oz (101 kg) IBW/kg (Calculated) : 77.6 Heparin Dosing Weight: 97 kg  Vital Signs: Temp: 98.9 F (37.2 C) (09/10 1210) Temp Source: Oral (09/10 1210) BP: 142/94 (09/10 1210) Pulse Rate: 91 (09/10 1210)  Labs: Recent Labs    08/13/18 0352 08/13/18 1005 08/14/18 0638 08/14/18 1545 08/15/18 0240 08/15/18 0836  HGB 9.9* 10.6* 10.3*  --   --  10.4*  HCT 30.9* 33.1* 31.8*  --   --  32.6*  PLT 292  --  360  --   --  367  APTT  --   --   --  36 117*  --   HEPARINUNFRC  --   --   --  1.06* 0.76*  --   CREATININE 0.38*  --  <0.30*  --   --  0.39*    Estimated Creatinine Clearance: 123.9 mL/min (A) (by C-G formula based on SCr of 0.39 mg/dL (L)).   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 GI is currently seeing patient for duodentitis/duodenal ulcers (does not suspect pt has pancreatis).  Xarelto was resumed on 9/2.  Per Dr. Antonieta Pert request, To transition patient to heparin drip on 9/9.  - [facility] indicated that patient is not a candidate for cholecystectomy at this time d/t duodenitis - MRCP on 9/9 showed Acute pancreatitis, no evidence of pancreatic pseudocyst or necrosis. Large kidney mass compatible with renal call carcinoma. ascites  Today, 08/15/2018: - heparin level now back therapeutic at 0.55 - no bleeding documented - AST/ALT elevated but trending down  - Alk phos elevated, TBili trending up (16) - Pt's child pugh score is C.  Of note, Xarelto should ve avoided in pts with child pugh  score of B or C.   Goal of Therapy:  Heparin level 0.3-0.7 units/ml aPTT 66-102 seconds Monitor platelets by anticoagulation protocol: Yes   Plan:  - heparin drip at 1400 units/hr - daily heparin level -monitor for s/s bleeding  Chevez Sambrano P 08/15/2018,2:09 PM

## 2018-08-15 NOTE — Progress Notes (Signed)
Resumed care of patient at 1500. Agree with prior RNs assessment. Will continue to monitor.

## 2018-08-15 NOTE — Discharge Instructions (Signed)
Everolimus tablets What is this medicine? EVEROLIMUS (eve ROE li mus) decreases the activity of your immune system. Afinitor is used to treat certain types of cancer. Zortress is used for kidney and liver transplant rejection prophylaxis. This medicine may be used for other purposes; ask your health care provider or pharmacist if you have questions. COMMON BRAND NAME(S): Afinitor, Zortress What should I tell my health care provider before I take this medicine? They need to know if you have any of these conditions: -diabetes -heart disease -high cholesterol -immune system problems -infection (especially a virus infection such as chickenpox, cold sores, or herpes) -kidney disease -liver disease -low blood counts, like low white cell, platelet, or red cell counts -rare hereditary problems of galactose intolerance, the Lapp lactase deficiency, or glucose-galactose malabsorption -an unusual or allergic reaction to everolimus, other medicines, foods, dyes, or preservatives -pregnant or trying to get pregnant -breast-feeding How should I use this medicine? Take this medicine by mouth with a full glass of water. Follow the directions on the prescription label. You can take this medicine with or without food, but always take Zortress the same way. Do not cut, crush, or chew this medicine. Do not take with grapefruit juice. Take your medicine at regular intervals. Do not take it more often than directed. Do not stop taking except on your doctor's advice. A special MedGuide will be given to you by the pharmacist with each prescription and refill. Be sure to read this information carefully each time. Talk to your pediatrician regarding the use of this medicine in children. Special care may be needed. Overdosage: If you think you have taken too much of this medicine contact a poison control center or emergency room at once. NOTE: This medicine is only for you. Do not share this medicine with others. What  if I miss a dose? If you miss a dose, take it as soon as you can. If it is almost time for your next dose, take only that dose. Do not take double or extra doses. What may interact with this medicine? This medicine may interact with the following medications: -antiviral medicines for HIV or AIDS -aprepitant -carbamazepine -certain medicines for cholesterol like simvastatin -clarithromycin -cyclosporine -dexamethasone -diltiazem -erythromycin -fluconazole -grapefruit juice -itraconazole -ketoconazole -live vaccines -nefazodone -nicardipine -phenobarbital -phenytoin -rifabutin -rifampin -telithromycin -verapamil -voriconazole This list may not describe all possible interactions. Give your health care provider a list of all the medicines, herbs, non-prescription drugs, or dietary supplements you use. Also tell them if you smoke, drink alcohol, or use illegal drugs. Some items may interact with your medicine. What should I watch for while using this medicine? This drug may make you feel generally unwell. This is not uncommon, as chemotherapy can affect healthy cells as well as cancer cells. Report any side effects. Continue your course of treatment even though you feel ill unless your doctor tells you to stop. Visit your doctor or health care professional for regular check-ups. You may need regular tests to monitor possible side effects of the drug. This medicine may affect blood sugar levels. If you have diabetes, check with your doctor or health care professional before you change your diet or the dose of your diabetic medicine. Do not become pregnant while taking this medicine or for 8 weeks after stopping it. Women should inform their doctor if they wish to become pregnant or think they might be pregnant. Men should not father a child while taking this medicine and for 4 weeks after stopping it. There  is a potential for serious side effects to an unborn child. Talk to your health care  professional or pharmacist for more information. Do not breast-feed an infant while taking this medicine or for 2 weeks after stopping it. This medicine has caused ovarian failure in some women and reduced sperm counts in some men. This medicine may interfere with the ability to have a child. You should talk with your doctor or health care professional if you are concerned about your fertility. This medicine has caused reduced sperm counts in some men. This may interfere with the ability to father a child. You should talk to your doctor or health care professional if you are concerned about your fertility. Call your doctor or health care professional for advice if you get a fever, chills or sore throat, or other symptoms of a cold or flu. Do not treat yourself. This drug decreases your body's ability to fight infections. Try to avoid being around people who are sick. This medicine may increase your risk to bruise or bleed. Call your doctor or health care professional if you notice any unusual bleeding. If you have had a kidney transplant, immediately tell your doctor if your incision site is red, warm, or painful. Also, tell your doctor if your incision site opens up or swells or if contains blood, fluid, or pus. Keep out of the sun. If you cannot avoid being in the sun, wear protective clothing and use sunscreen. Do not use sun lamps or tanning beds/booths. What side effects may I notice from receiving this medicine? Side effects that you should report to your doctor or health care professional as soon as possible: -allergic reactions like skin rash, itching or hives, swelling of the face, lips, or tongue -breathing problems -chest pain -cough -dark urine -fever or chills, sore throat -increased hunger or thirst -increased urination -nausea, vomiting -stomach pain -swelling of ankles, feet, hands -trouble passing urine or change in the amount of urine -unusual bleeding or bruising -unusually  weak or tired Side effects that usually do not require medical attention (report to your doctor or health care professional if they continue or are bothersome): -constipation -diarrhea -dizziness -dry mouth or mouth sores -headache -nausea, vomiting This list may not describe all possible side effects. Call your doctor for medical advice about side effects. You may report side effects to FDA at 1-800-FDA-1088. Where should I keep my medicine? Keep out of the reach of children. Store at room temperature between 15 and 30 degrees C (59 and 86 degrees F). Throw away any unused medicine after the expiration date. NOTE: This sheet is a summary. It may not cover all possible information. If you have questions about this medicine, talk to your doctor, pharmacist, or health care provider.  2018 [employer] (2016-09-09 06:31:11)

## 2018-08-15 NOTE — Progress Notes (Signed)
Patient's pain 7/10. Too early to give IV diluadid. Paged Bodenheimer. New order for 1 time dose diluadid and order for IV toradol q6h restarted. Continue to monitor.

## 2018-08-15 NOTE — Progress Notes (Signed)
PROGRESS NOTE    Alexander Fry  OIT:[id number] DOB: 09-Feb-1961 DOA: 08/06/2018 PCP: [name], PA-C   Brief Narrative:  HPI On 08/07/2018 by Dr. [name] Alexander Fry  is a 57 y.o. male, w hx of metastatic renal cell carcinoma (bone, brain, lung), h/o R IJ thrombus, Iron deficiency anemia,  w recent admission for abdominal pain where he was found to have duodenitis.  Pt apparently went home and continued to have abdominal pain which was worse yesterday associated with some n/v.  Pt denies fever, chills, diarrhea, brbpr, black stool.  Pt has been taking protonix.  Denies NSAIDS.    Interim history Admitted for acute pancreatitis.  Gastroenterology consulted and appreciated.  Continues to have abdominal pain and is requiring both IV as well as oral pain medications quite frequently.  Assessment & Plan   Acute pancreatitis -CT abdomen pelvis shows redemonstration of transmural thickening of the second and third portion of the duodenum suggestive of duodenitis, some interval decrease. -Patient continues to need both IV Dilaudid and oral oxycodone frequently. -Triglycerides 73; lipase on admission 969-down to 113 -Gastroenterology consulted and appreciated recommended conservative management -General surgery consulted and appreciated, felt patient is a poor surgical candidate -Discussed with Dr. Benson Norway, gastroenterology, recommended obtaining KUB, wonders if some of patient's abdominal pain could be narcotic related constipation.  Recommended trying Linzess as well as Fleet enema- given on 9/4. -CT abd: possibility of duodenitis, peripancreatic fluid, pancreatitis not excluded   Abdominal pain -?secondary to the above vs constipation vs referred from renal cell -continue IV dilaudid, oxycodone -continues to have pain today, increased fentanyl patch -Continue toradol for anti-inflammatory properties  -Oncology, Dr. Marin Olp, feels this is related to the gallbladder and feels the  gallbladder needs to be removed as patient's bilirubin continues to rise -Discussed with surgery, Dr. Marcello Moores, feels patient would be high risk for surgery given the inflammation.  Feels inflammation will likely take months to subside.  Concerned that if cholecystectomy is attempted there could be duodenal injury which would be fatal.  She continues to recommend conservative management at this time. -Unfortunately HIDA scan cannot be done as patient cannot come off of narcotic medications at this time.  This was also discussed with Dr. Benson Norway via phone. -MRCP: Acute pancreatitis, no evidence of pancreatic pseudocyst or necrosis.  Abnormal increased caliber of intrahepatic ducts; enhancement of common bile duct identified, nonspecific but may reflect cholangitis.  Gallstones and diffuse gallbladder wall thickening with enhancement.  Large right kidney mass compatible with renal cell carcinoma.  Ascites.  Constipation -Though patient has not had any solid intake in many days, suspect medication induced.  Few bowel sounds noted on exam. -Abdominal x-ray on 08/06/2018 showed moderate volume of stool in the ascending colon -Discussed with Dr. Benson Norway today. Patient was given an enema on 9/4, with minimal BM yesterday. Was started on linzess. Dr. Benson Norway recommended repeat CT scan today.  -Continue MiraLAX 3 times daily, movanti, carafate, reglan -Encouraged patient to get out of bed and walk around -patient has had several bowel movements  Duodenitis -Completed course of Cipro and Flagyl -Continue Protonix -CT abdomen pelvis as above  Abnormal LFTs -Trending downward very slowly -Hepatitis panel unremarkable  Hypokalemia -potassium 3.2, will replace and monitor  -magnesium was checked, 1.9  Diabetes mellitus, type II -Continue insulin sliding scale with CBG monitoring  Metastatic renal cell carcinoma -Follow-up with oncology as an outpatient -Possibly elevation in alk phos due to metastatic bone  disease -Discussed with Dr. Marin Olp- if  abdominal pain is referred from cancer, may need nephrectomy- recommended urology consult -discussed with patient, he feels he would like to wait on urology consult and possibly pursue this as an outpatient following a PET scan  Chronic normocytic anemia -Appears to be stable, currently 10.3  Right IJ thrombus -was placed on xarelto- but given liver function, currently Child Pugh Class B or C  -placed on heparin  Lower extremity edema -suspect from abdominal pathology and venous insuffiencey -Do not feel Lasix is the appropriate treatment -Feel patient to keep his legs elevated and ambulate when possible -Ted hose ordered  Goals of care -discussed with patient, agreed to palliative care consulted and appreciated- especially for symptom management  DVT Prophylaxis  heparin  Code Status: Full  Family Communication: None at bedside  Disposition Plan: Admitted.  Pending improvement in abdominal pain and MRCP.   Consultants Gastroenterology General surgery Oncology Palliative care  Procedures  MRCP  Antibiotics   Anti-infectives (From admission, onward)   Start     Dose/Rate Route Frequency Ordered Stop   08/07/18 1800  ciprofloxacin (CIPRO) IVPB 400 mg  Status:  Discontinued     400 mg 200 mL/hr over 60 Minutes Intravenous Every 12 hours 08/07/18 0321 08/09/18 1514   08/07/18 0300  metroNIDAZOLE (FLAGYL) IVPB 500 mg  Status:  Discontinued     500 mg 100 mL/hr over 60 Minutes Intravenous Every 8 hours 08/07/18 0253 08/09/18 1514   08/07/18 0300  ciprofloxacin (CIPRO) IVPB 400 mg     400 mg 200 mL/hr over 60 Minutes Intravenous  Once 08/07/18 0258 08/07/18 0415      Subjective:   Alexander Fry seen and examined today.  Feels pain is mildly improved, currently 2 out of 10.  Denies current chest pain, shortness of breath, dizziness or headache.  Continues to feel abdominal pain and distention as well as back pain.  Objective:    Vitals:   08/14/18 1957 08/14/18 1957 08/15/18 0411 08/15/18 1210  BP: (!) 148/86 (!) 148/86 128/83 (!) 142/94  Pulse: 91 91 79 91  Resp: '18 18 16 16  ' Temp: (!) 97.5 F (36.4 C) (!) 97.5 F (36.4 C) 98.1 F (36.7 C) 98.9 F (37.2 C)  TempSrc: Oral Oral Oral Oral  SpO2: 99% 99% 99% 98%  Weight:   101 kg   Height:        Intake/Output Summary (Last 24 hours) at 08/15/2018 1307 Last data filed at 08/15/2018 1251 Gross per 24 hour  Intake 770.31 ml  Output 1750 ml  Net -979.69 ml   Filed Weights   08/13/18 0451 08/14/18 0453 08/15/18 0411  Weight: 100.2 kg 100.8 kg 101 kg   Exam  General: Well developed, well nourished, NAD, appears stated age  71: NCAT, mucous membranes moist.   Neck: Supple  Cardiovascular: S1 S2 auscultated, no murmurs, RRR  Respiratory: Clear to auscultation bilaterally with equal chest rise  Abdomen: Soft, nontender, nondistended, + bowel sounds  Extremities: warm dry without cyanosis clubbing. LE edema B/L  Neuro: AAOx3, nonfocal  Psych: Normal affect and demeanor  Data Reviewed: I have personally reviewed following labs and imaging studies  CBC: Recent Labs  Lab 08/10/18 0420 08/12/18 0412 08/13/18 0352 08/13/18 1005 08/14/18 0638 08/15/18 0836  WBC 6.6 7.9 7.1  --  9.2 10.6*  HGB 10.9* 11.1* 9.9* 10.6* 10.3* 10.4*  HCT 34.1* 34.2* 30.9* 33.1* 31.8* 32.6*  MCV 76.5* 76.7* 77.1*  --  77.2* 77.8*  PLT 262 370 292  --  360 024   Basic Metabolic Panel: Recent Labs  Lab 08/09/18 1114  08/11/18 0337 08/12/18 0412 08/13/18 0352 08/14/18 0638 08/15/18 0836  NA  --    < > 139 138 137 137 133*  K  --    < > 3.1* 3.8 3.4* 3.5 3.2*  CL  --    < > 102 102 103 100 95*  CO2  --    < > '29 27 28 27 27  ' GLUCOSE  --    < > 117* 134* 130* 115* 113*  BUN  --    < > <5* <5* 5* <5* 7  CREATININE  --    < > 0.36* 0.30* 0.38* <0.30* 0.39*  CALCIUM  --    < > 8.3* 8.7* 8.4* 8.6* 8.5*  MG 2.0  --  1.9  --   --   --   --    < > = values  in this interval not displayed.   GFR: Estimated Creatinine Clearance: 123.9 mL/min (A) (by C-G formula based on SCr of 0.39 mg/dL (L)). Liver Function Tests: Recent Labs  Lab 08/10/18 0420 08/11/18 0337 08/13/18 0352 08/14/18 0638 08/15/18 0836  AST 142* 157* 148* 127* 91*  ALT 146* 140* 125* 116* 98*  ALKPHOS 878* 916* 1,020* 1,081* 1,129*  BILITOT 9.6* 10.1* 11.8* 14.7* 16.0*  PROT 5.6* 5.2* 5.0* 5.2* 5.6*  ALBUMIN 2.4* 2.2* 2.2* 2.1* 2.2*   Recent Labs  Lab 08/09/18 1114 08/09/18 1257 08/11/18 0337 08/13/18 0352 08/15/18 0836  LIPASE 185* 180* 139* 113* 80*   No results for input(s): AMMONIA in the last 168 hours. Coagulation Profile: No results for input(s): INR, PROTIME in the last 168 hours. Cardiac Enzymes: No results for input(s): CKTOTAL, CKMB, CKMBINDEX, TROPONINI in the last 168 hours. BNP (last 3 results) No results for input(s): PROBNP in the last 8760 hours. HbA1C: No results for input(s): HGBA1C in the last 72 hours. CBG: Recent Labs  Lab 08/14/18 1955 08/14/18 2338 08/15/18 0409 08/15/18 0742 08/15/18 1213  GLUCAP 98 116* 107* 106* 185*   Lipid Profile: No results for input(s): CHOL, HDL, LDLCALC, TRIG, CHOLHDL, LDLDIRECT in the last 72 hours. Thyroid Function Tests: No results for input(s): TSH, T4TOTAL, FREET4, T3FREE, THYROIDAB in the last 72 hours. Anemia Panel: No results for input(s): VITAMINB12, FOLATE, FERRITIN, TIBC, IRON, RETICCTPCT in the last 72 hours. Urine analysis:    Component Value Date/Time   COLORURINE AMBER (A) 08/01/2018 1107   APPEARANCEUR CLEAR 08/01/2018 1107   LABSPEC 1.021 08/01/2018 1107   PHURINE 5.0 08/01/2018 1107   GLUCOSEU 50 (A) 08/01/2018 1107   HGBUR SMALL (A) 08/01/2018 1107   BILIRUBINUR MODERATE (A) 08/01/2018 1107   KETONESUR 80 (A) 08/01/2018 1107   PROTEINUR 30 (A) 08/01/2018 1107   NITRITE NEGATIVE 08/01/2018 1107   LEUKOCYTESUR SMALL (A) 08/01/2018 1107   Sepsis  Labs: '@LABRCNTIP' (procalcitonin:4,lacticidven:4)  )No results found for this or any previous visit (from the past 240 hour(s)).    Radiology Studies: Mr 3d Recon At Scanner  Result Date: 08/14/2018 CLINICAL DATA:  Renal cell carcinoma.  Abdominal distention. EXAM: MRI ABDOMEN WITHOUT AND WITH CONTRAST (INCLUDING MRCP) TECHNIQUE: Multiplanar multisequence MR imaging of the abdomen was performed both before and after the administration of intravenous contrast. Heavily T2-weighted images of the biliary and pancreatic ducts were obtained, and three-dimensional MRCP images were rendered by post processing. CONTRAST:  10 cc of Gadavist COMPARISON:  08/07/2018 FINDINGS: Lower chest: Small bilateral pleural effusions identified. Hepatobiliary: Mild  T2 hyperintense and T1 hypointense structure within right lobe of liver measures 1 cm, image 41/1100 1. No additional focal liver abnormalities identified. Stones are identified within the gallbladder. The largest measures 1.6 cm. There is mild diffuse gallbladder wall thickening. Moderate to marked intrahepatic bile duct dilatation identified. The intrahepatic ducts are dilated to the level of the central confluence, image 118/6 and image number 72/12. No discrete mass identified. The common bile duct is relatively normal in caliber measuring 6 mm with diffuse mural enhancement. The caliber of the common bile duct becomes diminutive at the level of the head of pancreas. No choledocholithiasis identified. Pancreas: Mild diffuse pancreatic edema and peripancreatic fat stranding and free fluid is noted compatible with acute pancreatitis. No pancreatic mass identified. No evidence for pancreatic necrosis or pseudocyst formation. Spleen:  Within normal limits in size and appearance. Adrenals/Urinary Tract: Normal appearance of the right adrenal gland. Small left adrenal gland nodule measuring 8 mm is nonspecific. Simple appearing cyst arises from upper pole of left kidney  measuring 2.8 cm. Large complex enhancing solid mass arising from inferior pole of right kidney is again noted measuring 10 cm, image 95/1101, compatible with a renal cell carcinoma. Small right renal veins appear patent. No tumor thrombus identified within the IVC. Stomach/Bowel: The stomach is nondistended. Wall thickening involving the second and third portions of the duodenum identified. Vascular/Lymphatic: Normal appearance of the abdominal aorta. Enlarged abdominal lymph nodes are identified compatible with metastatic adenopathy. Index left retroperitoneal node measures 1.8 cm, image 89/1100 1. Prominent bilateral retrocrural lymph nodes are also noted and worrisome for metastatic disease. Index node on the right measures 1.2 cm, image 44/1100 1. Within the chest there is an enlarged subcarinal node measuring 2.4 cm. Other: Moderate volume of ascites identified within the upper abdomen. Increased in volume from recent CT. Musculoskeletal: Enhancing bone lesions are identified involving L1 and L4 vertebra. IMPRESSION: 1. Imaging findings compatible with acute pancreatitis. No evidence for pancreatic pseudocyst or necrosis. 2. There is abnormal increase caliber of the intrahepatic ducts to the level of the confluence with a relative normal caliber common bile duct. Enhancement of the common bile duct is identified, nonspecific but may reflect cholangitis. No choledocholithiasis or obstructing mass noted. 3. Gallstones and diffuse gallbladder wall thickening with enhancement. Cannot rule out cholecystitis. 4. Large right kidney mass compatible with renal cell carcinoma. There is evidence of nodal metastasis within the subcarinal region of the mediastinum, retrocrural stations and upper abdomen. Enhancing lesions within the lumbar spine compatible with bone metastases. 5. Ascites.  Increased from previous exam. 6. Wall thickening of the second and third portions of the duodenum which may reflect duodenitis. This  may reflect primary duodenitis or secondary inflammation due to pancreatitis. Electronically Signed   By: Kerby Moors M.D.   On: 08/14/2018 21:33   Mr Abdomen Mrcp Moise Boring Contast  Result Date: 08/14/2018 CLINICAL DATA:  Renal cell carcinoma.  Abdominal distention. EXAM: MRI ABDOMEN WITHOUT AND WITH CONTRAST (INCLUDING MRCP) TECHNIQUE: Multiplanar multisequence MR imaging of the abdomen was performed both before and after the administration of intravenous contrast. Heavily T2-weighted images of the biliary and pancreatic ducts were obtained, and three-dimensional MRCP images were rendered by post processing. CONTRAST:  10 cc of Gadavist COMPARISON:  08/07/2018 FINDINGS: Lower chest: Small bilateral pleural effusions identified. Hepatobiliary: Mild T2 hyperintense and T1 hypointense structure within right lobe of liver measures 1 cm, image 41/1100 1. No additional focal liver abnormalities identified. Stones are identified within the gallbladder.  The largest measures 1.6 cm. There is mild diffuse gallbladder wall thickening. Moderate to marked intrahepatic bile duct dilatation identified. The intrahepatic ducts are dilated to the level of the central confluence, image 118/6 and image number 72/12. No discrete mass identified. The common bile duct is relatively normal in caliber measuring 6 mm with diffuse mural enhancement. The caliber of the common bile duct becomes diminutive at the level of the head of pancreas. No choledocholithiasis identified. Pancreas: Mild diffuse pancreatic edema and peripancreatic fat stranding and free fluid is noted compatible with acute pancreatitis. No pancreatic mass identified. No evidence for pancreatic necrosis or pseudocyst formation. Spleen:  Within normal limits in size and appearance. Adrenals/Urinary Tract: Normal appearance of the right adrenal gland. Small left adrenal gland nodule measuring 8 mm is nonspecific. Simple appearing cyst arises from upper pole of left kidney  measuring 2.8 cm. Large complex enhancing solid mass arising from inferior pole of right kidney is again noted measuring 10 cm, image 95/1101, compatible with a renal cell carcinoma. Small right renal veins appear patent. No tumor thrombus identified within the IVC. Stomach/Bowel: The stomach is nondistended. Wall thickening involving the second and third portions of the duodenum identified. Vascular/Lymphatic: Normal appearance of the abdominal aorta. Enlarged abdominal lymph nodes are identified compatible with metastatic adenopathy. Index left retroperitoneal node measures 1.8 cm, image 89/1100 1. Prominent bilateral retrocrural lymph nodes are also noted and worrisome for metastatic disease. Index node on the right measures 1.2 cm, image 44/1100 1. Within the chest there is an enlarged subcarinal node measuring 2.4 cm. Other: Moderate volume of ascites identified within the upper abdomen. Increased in volume from recent CT. Musculoskeletal: Enhancing bone lesions are identified involving L1 and L4 vertebra. IMPRESSION: 1. Imaging findings compatible with acute pancreatitis. No evidence for pancreatic pseudocyst or necrosis. 2. There is abnormal increase caliber of the intrahepatic ducts to the level of the confluence with a relative normal caliber common bile duct. Enhancement of the common bile duct is identified, nonspecific but may reflect cholangitis. No choledocholithiasis or obstructing mass noted. 3. Gallstones and diffuse gallbladder wall thickening with enhancement. Cannot rule out cholecystitis. 4. Large right kidney mass compatible with renal cell carcinoma. There is evidence of nodal metastasis within the subcarinal region of the mediastinum, retrocrural stations and upper abdomen. Enhancing lesions within the lumbar spine compatible with bone metastases. 5. Ascites.  Increased from previous exam. 6. Wall thickening of the second and third portions of the duodenum which may reflect duodenitis. This  may reflect primary duodenitis or secondary inflammation due to pancreatitis. Electronically Signed   By: Kerby Moors M.D.   On: 08/14/2018 21:33     Scheduled Meds: . diclofenac sodium  2 g Topical QID  . docusate sodium  100 mg Oral BID  . feeding supplement  1 Container Oral TID BM  . fentaNYL  25 mcg Transdermal Q72H  . insulin aspart  0-9 Units Subcutaneous Q4H  . linaclotide  145 mcg Oral QAC breakfast  . methylPREDNISolone (SOLU-MEDROL) injection  80 mg Intravenous Q12H  . metoCLOPramide  10 mg Oral QID  . naloxegol oxalate  25 mg Oral Daily  . pantoprazole  40 mg Oral BID  . peg 3350 powder  0.5 kit Oral Once  . polyethylene glycol  34 g Oral TID  . sucralfate  1 g Oral TID WC & HS   Continuous Infusions: . sodium chloride Stopped (08/10/18 1357)  . dextrose 5 % and 0.9% NaCl 10 mL/hr at 08/14/18  1957  . heparin 1,400 Units/hr (08/15/18 0639)     LOS: 8 days   Time Spent in minutes   45 minutes (greater than 50% of time spent with patient face to face, as well as reviewing records, calling consults, and formulating a plan)  Cristal Ford D.O. on 08/15/2018 at 1:07 PM  Between 7am to 7pm - Please see pager noted on [email or web address]  After 7pm go to [email or web address]  And look for the night coverage person covering for me after hours  [facility] Office  [phone]

## 2018-08-15 NOTE — Progress Notes (Signed)
ANTICOAGULATION CONSULT NOTE - Follow Up Consult  Pharmacy Consult for Heparin Indication: hx right IJ thrombus (xarelto on hold)  Allergies  Allergen Reactions  . Feraheme [Ferumoxytol] Anaphylaxis and Other (See Comments)    Flush. Patient denies anaphylaxis.    Patient Measurements: Height: 6' (182.9 cm) Weight: 222 lb 9.6 oz (101 kg) IBW/kg (Calculated) : 77.6 Heparin Dosing Weight:   Vital Signs: Temp: 98.1 F (36.7 C) (09/10 0411) Temp Source: Oral (09/10 0411) BP: 128/83 (09/10 0411) Pulse Rate: 79 (09/10 0411)  Labs: Recent Labs    08/13/18 0352 08/13/18 1005 08/14/18 0638 08/14/18 1545 08/15/18 0240  HGB 9.9* 10.6* 10.3*  --   --   HCT 30.9* 33.1* 31.8*  --   --   PLT 292  --  360  --   --   APTT  --   --   --  36 117*  HEPARINUNFRC  --   --   --  1.06* 0.76*  CREATININE 0.38*  --  <0.30*  --   --     CrCl cannot be calculated (This lab value cannot be used to calculate CrCl because it is not a number: <0.30).   Medications:  Infusions:  . sodium chloride Stopped (08/10/18 1357)  . dextrose 5 % and 0.9% NaCl 10 mL/hr at 08/14/18 1957  . heparin 1,400 Units/hr (08/15/18 0441)    Assessment: Patient with high PTT and heparin level.   PTT ordered with Heparin level until both correlate due to possible drug-lab interaction between oral anticoagulant (rivaroxaban, edoxaban, or apixaban) and anti-Xa level (aka heparin level).  PTT and heparin are correlating now and therefore will only use heparin level going forward.  No heparin issues per RN.  Goal of Therapy:  Heparin level 0.3-0.7 units/ml Monitor platelets by anticoagulation protocol: Yes   Plan:  Decrease heparin to 1400 units/hr Recheck level at [address], [place] 08/15/2018,4:43 AM

## 2018-08-15 NOTE — Progress Notes (Signed)
Palliative:  Full note to follow. I have met with Alexander Fry who is a very pleasant gentleman as well as his son at bedside. He is also a very complicated gentleman with many health issues currently including poorly controlled pain. Overall his pain has been better controlled today not increasing above 5-6/10 but is still requiring frequent IV dilaudid doses to maintain pain at this adequate level for him with goal 3/10. Pain is acute on chronic in right lower back stabbing pain slightly tender to palpation in certain areas. This pain will on occasion radiate around to abd. Plan to continue to titrate fentanyl patch to maintain tolerable pain and to transition potentially to oral dilaudid for breakthrough pain once need for IV dilaudid decreases hopefully from fentanyl patch titration.   Hospitalization complicated by acute pancreatitis and rising bilirubin which complicates situation as he is not a surgical candidate to remove gallbladder or kidney per surgical consult given inflammation. Alexander Fry agrees that if the surgery is very risky he would not want "to be cut on" anyway. I gently explained to them that I am worried if we cannot reverse the cause and his bilirubin continues to worsen where this will lead [name]. His son does share that through all his father has been through that this is the worst.   Answered all questions/concerns. Emotional support provided. I will continue to follow and assist with pain management as well as GOC. Goals today were to introduce palliative care and build rapport. I will follow up tomorrow.   Vinie Sill, NP Palliative Medicine Team Pager # [phone] (M-F 8a-5p) Team Phone # [phone] (Nights/Weekends)

## 2018-08-15 NOTE — Progress Notes (Signed)
Subjective: Feeling better from the GI standpoint.  Objective: Vital signs in last 24 hours: Temp:  [98.1 F (36.7 C)-98.9 F (37.2 C)] 98.1 F (36.7 C) (09/10 2000) Pulse Rate:  [79-91] 83 (09/10 2000) Resp:  [14-16] 14 (09/10 2000) BP: (128-142)/(78-94) 128/78 (09/10 2000) SpO2:  [93 %-99 %] 93 % (09/10 2000) Weight:  [101 kg] 101 kg (09/10 0411) Last BM Date: 08/15/18  Intake/Output from previous day: 09/09 0701 - 09/10 0700 In: 520.3 [P.O.:400; I.V.:120.3] Out: 2300 [Urine:2300] Intake/Output this shift: No intake/output data recorded.  General appearance: alert, icteric, no distress and jaundiced GI: soft, non-tender; bowel sounds normal; no masses,  no organomegaly  Lab Results: Recent Labs    08/13/18 0352 08/13/18 1005 08/14/18 0638 08/15/18 0836  WBC 7.1  --  9.2 10.6*  HGB 9.9* 10.6* 10.3* 10.4*  HCT 30.9* 33.1* 31.8* 32.6*  PLT 292  --  360 367   BMET Recent Labs    08/13/18 0352 08/14/18 0638 08/15/18 0836  NA 137 137 133*  K 3.4* 3.5 3.2*  CL 103 100 95*  CO2 _0 GLUCOSE 130* 115* 113*  BUN 5* <5* 7  CREATININE 0.38* <0.30* 0.39*  CALCIUM 8.4* 8.6* 8.5*   LFT Recent Labs    08/15/18 0836  PROT 5.6*  ALBUMIN 2.2*  AST 91*  ALT 98*  ALKPHOS 1,129*  BILITOT 16.0*   PT/INR No results for input(s): LABPROT, INR in the last 72 hours. Hepatitis Panel No results for input(s): HEPBSAG, HCVAB, HEPAIGM, HEPBIGM in the last 72 hours. C-Diff No results for input(s): CDIFFTOX in the last 72 hours. Fecal Lactopherrin No results for input(s): FECLLACTOFRN in the last 72 hours.  Studies/Results: Mr 3d Recon At Scanner  Result Date: 08/14/2018 CLINICAL DATA:  Renal cell carcinoma.  Abdominal distention. EXAM: MRI ABDOMEN WITHOUT AND WITH CONTRAST (INCLUDING MRCP) TECHNIQUE: Multiplanar multisequence MR imaging of the abdomen was performed both before and after the administration of intravenous contrast. Heavily T2-weighted images of the  biliary and pancreatic ducts were obtained, and three-dimensional MRCP images were rendered by post processing. CONTRAST:  10 cc of Gadavist COMPARISON:  08/07/2018 FINDINGS: Lower chest: Small bilateral pleural effusions identified. Hepatobiliary: Mild T2 hyperintense and T1 hypointense structure within right lobe of liver measures 1 cm, image 41/1100 1. No additional focal liver abnormalities identified. Stones are identified within the gallbladder. The largest measures 1.6 cm. There is mild diffuse gallbladder wall thickening. Moderate to marked intrahepatic bile duct dilatation identified. The intrahepatic ducts are dilated to the level of the central confluence, image 118/6 and image number 72/12. No discrete mass identified. The common bile duct is relatively normal in caliber measuring 6 mm with diffuse mural enhancement. The caliber of the common bile duct becomes diminutive at the level of the head of pancreas. No choledocholithiasis identified. Pancreas: Mild diffuse pancreatic edema and peripancreatic fat stranding and free fluid is noted compatible with acute pancreatitis. No pancreatic mass identified. No evidence for pancreatic necrosis or pseudocyst formation. Spleen:  Within normal limits in size and appearance. Adrenals/Urinary Tract: Normal appearance of the right adrenal gland. Small left adrenal gland nodule measuring 8 mm is nonspecific. Simple appearing cyst arises from upper pole of left kidney measuring 2.8 cm. Large complex enhancing solid mass arising from inferior pole of right kidney is again noted measuring 10 cm, image 95/1101, compatible with a renal cell carcinoma. Small right renal veins appear patent. No tumor thrombus identified within the IVC. Stomach/Bowel: The stomach is  nondistended. Wall thickening involving the second and third portions of the duodenum identified. Vascular/Lymphatic: Normal appearance of the abdominal aorta. Enlarged abdominal lymph nodes are identified  compatible with metastatic adenopathy. Index left retroperitoneal node measures 1.8 cm, image 89/1100 1. Prominent bilateral retrocrural lymph nodes are also noted and worrisome for metastatic disease. Index node on the right measures 1.2 cm, image 44/1100 1. Within the chest there is an enlarged subcarinal node measuring 2.4 cm. Other: Moderate volume of ascites identified within the upper abdomen. Increased in volume from recent CT. Musculoskeletal: Enhancing bone lesions are identified involving L1 and L4 vertebra. IMPRESSION: 1. Imaging findings compatible with acute pancreatitis. No evidence for pancreatic pseudocyst or necrosis. 2. There is abnormal increase caliber of the intrahepatic ducts to the level of the confluence with a relative normal caliber common bile duct. Enhancement of the common bile duct is identified, nonspecific but may reflect cholangitis. No choledocholithiasis or obstructing mass noted. 3. Gallstones and diffuse gallbladder wall thickening with enhancement. Cannot rule out cholecystitis. 4. Large right kidney mass compatible with renal cell carcinoma. There is evidence of nodal metastasis within the subcarinal region of the mediastinum, retrocrural stations and upper abdomen. Enhancing lesions within the lumbar spine compatible with bone metastases. 5. Ascites.  Increased from previous exam. 6. Wall thickening of the second and third portions of the duodenum which may reflect duodenitis. This may reflect primary duodenitis or secondary inflammation due to pancreatitis. Electronically Signed   By: Kerby Moors M.D.   On: 08/14/2018 21:33   Mr Abdomen Mrcp Moise Boring Contast  Result Date: 08/14/2018 CLINICAL DATA:  Renal cell carcinoma.  Abdominal distention. EXAM: MRI ABDOMEN WITHOUT AND WITH CONTRAST (INCLUDING MRCP) TECHNIQUE: Multiplanar multisequence MR imaging of the abdomen was performed both before and after the administration of intravenous contrast. Heavily T2-weighted images of  the biliary and pancreatic ducts were obtained, and three-dimensional MRCP images were rendered by post processing. CONTRAST:  10 cc of Gadavist COMPARISON:  08/07/2018 FINDINGS: Lower chest: Small bilateral pleural effusions identified. Hepatobiliary: Mild T2 hyperintense and T1 hypointense structure within right lobe of liver measures 1 cm, image 41/1100 1. No additional focal liver abnormalities identified. Stones are identified within the gallbladder. The largest measures 1.6 cm. There is mild diffuse gallbladder wall thickening. Moderate to marked intrahepatic bile duct dilatation identified. The intrahepatic ducts are dilated to the level of the central confluence, image 118/6 and image number 72/12. No discrete mass identified. The common bile duct is relatively normal in caliber measuring 6 mm with diffuse mural enhancement. The caliber of the common bile duct becomes diminutive at the level of the head of pancreas. No choledocholithiasis identified. Pancreas: Mild diffuse pancreatic edema and peripancreatic fat stranding and free fluid is noted compatible with acute pancreatitis. No pancreatic mass identified. No evidence for pancreatic necrosis or pseudocyst formation. Spleen:  Within normal limits in size and appearance. Adrenals/Urinary Tract: Normal appearance of the right adrenal gland. Small left adrenal gland nodule measuring 8 mm is nonspecific. Simple appearing cyst arises from upper pole of left kidney measuring 2.8 cm. Large complex enhancing solid mass arising from inferior pole of right kidney is again noted measuring 10 cm, image 95/1101, compatible with a renal cell carcinoma. Small right renal veins appear patent. No tumor thrombus identified within the IVC. Stomach/Bowel: The stomach is nondistended. Wall thickening involving the second and third portions of the duodenum identified. Vascular/Lymphatic: Normal appearance of the abdominal aorta. Enlarged abdominal lymph nodes are identified  compatible with  metastatic adenopathy. Index left retroperitoneal node measures 1.8 cm, image 89/1100 1. Prominent bilateral retrocrural lymph nodes are also noted and worrisome for metastatic disease. Index node on the right measures 1.2 cm, image 44/1100 1. Within the chest there is an enlarged subcarinal node measuring 2.4 cm. Other: Moderate volume of ascites identified within the upper abdomen. Increased in volume from recent CT. Musculoskeletal: Enhancing bone lesions are identified involving L1 and L4 vertebra. IMPRESSION: 1. Imaging findings compatible with acute pancreatitis. No evidence for pancreatic pseudocyst or necrosis. 2. There is abnormal increase caliber of the intrahepatic ducts to the level of the confluence with a relative normal caliber common bile duct. Enhancement of the common bile duct is identified, nonspecific but may reflect cholangitis. No choledocholithiasis or obstructing mass noted. 3. Gallstones and diffuse gallbladder wall thickening with enhancement. Cannot rule out cholecystitis. 4. Large right kidney mass compatible with renal cell carcinoma. There is evidence of nodal metastasis within the subcarinal region of the mediastinum, retrocrural stations and upper abdomen. Enhancing lesions within the lumbar spine compatible with bone metastases. 5. Ascites.  Increased from previous exam. 6. Wall thickening of the second and third portions of the duodenum which may reflect duodenitis. This may reflect primary duodenitis or secondary inflammation due to pancreatitis. Electronically Signed   By: Kerby Moors M.D.   On: 08/14/2018 21:33    Medications:  Scheduled: . diclofenac sodium  2 g Topical QID  . docusate sodium  100 mg Oral BID  . feeding supplement  1 Container Oral TID BM  . fentaNYL  25 mcg Transdermal Q72H  . insulin aspart  0-9 Units Subcutaneous Q4H  . linaclotide  145 mcg Oral QAC breakfast  . methylPREDNISolone (SOLU-MEDROL) injection  80 mg Intravenous Q12H   . metoCLOPramide  10 mg Oral QID  . naloxegol oxalate  25 mg Oral Daily  . pantoprazole  40 mg Oral BID  . peg 3350 powder  0.5 kit Oral Once  . polyethylene glycol  34 g Oral TID  . sucralfate  1 g Oral TID WC & HS   Continuous: . sodium chloride Stopped (08/10/18 1357)  . dextrose 5 % and 0.9% NaCl 10 mL/hr at 08/14/18 1957  . heparin 1,400 Units/hr (08/15/18 7062)    Assessment/Plan: 1) Acute pancreatitis by MRI. 2) Cholelithiasis. 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 Martha Clan.   Clinically the patient does not appear to have acute pancreatitis.  He is tolerating PO without any difficulty and he reports that his abdominal pain is improving.  The only reason that he continues to use Dilaudid is to help control his lower back pain that is secondary to his boney mets.  There is no evidence of biliary ductal dilation or an overt cholecystitis, although there is the possibility of the latter with the MRI.  Surgery does not deem him to be a surgical candidate.  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  Plan: 1) Continue with supportive care. 2) No new recommendations.  The GI pain is improving in spite of the current report of an acute pancreatitis on the MRI.  He can continue with PO as he does not have any further pain. 3) Signing off from the GI standpoint.  Call if there are any other questions.  LOS: 8 days   Shikha Bibb D 08/15/2018, 10:02 PM

## 2018-08-16 DIAGNOSIS — C641 Malignant neoplasm of right kidney, except renal pelvis: Secondary | ICD-10-CM

## 2018-08-16 DIAGNOSIS — R52 Pain, unspecified: Secondary | ICD-10-CM

## 2018-08-16 DIAGNOSIS — C649 Malignant neoplasm of unspecified kidney, except renal pelvis: Secondary | ICD-10-CM

## 2018-08-16 LAB — COMPREHENSIVE METABOLIC PANEL
ALK PHOS: 1062 U/L — AB (ref 38–126)
ALT: 82 U/L — ABNORMAL HIGH (ref 0–44)
ANION GAP: 12 (ref 5–15)
AST: 49 U/L — ABNORMAL HIGH (ref 15–41)
Albumin: 2.3 g/dL — ABNORMAL LOW (ref 3.5–5.0)
BILIRUBIN TOTAL: 8.2 mg/dL — AB (ref 0.3–1.2)
BUN: 10 mg/dL (ref 6–20)
CALCIUM: 8.9 mg/dL (ref 8.9–10.3)
CO2: 24 mmol/L (ref 22–32)
Chloride: 97 mmol/L — ABNORMAL LOW (ref 98–111)
Creatinine, Ser: 0.52 mg/dL — ABNORMAL LOW (ref 0.61–1.24)
GFR calc non Af Amer: >60 mL/min (ref >60)
Glucose, Bld: 158 mg/dL — ABNORMAL HIGH (ref 70–99)
Potassium: 3.6 mmol/L (ref 3.5–5.1)
SODIUM: 133 mmol/L — AB (ref 135–145)
Total Protein: 6.1 g/dL — ABNORMAL LOW (ref 6.5–8.1)

## 2018-08-16 LAB — CBC
HEMATOCRIT: 33.9 % — AB (ref 39.0–52.0)
HEMOGLOBIN: 10.9 g/dL — AB (ref 13.0–17.0)
MCH: 24.8 pg — AB (ref 26.0–34.0)
MCHC: 32.2 g/dL (ref 30.0–36.0)
MCV: 77.2 fL — ABNORMAL LOW (ref 78.0–100.0)
Platelets: 489 10*3/uL — ABNORMAL HIGH (ref 150–400)
RBC: 4.39 MIL/uL (ref 4.22–5.81)
RDW: 21 % — ABNORMAL HIGH (ref 11.5–15.5)
WBC: 16.2 10*3/uL — ABNORMAL HIGH (ref 4.0–10.5)

## 2018-08-16 LAB — GLUCOSE, CAPILLARY
GLUCOSE-CAPILLARY: 123 mg/dL — AB (ref 70–99)
GLUCOSE-CAPILLARY: 139 mg/dL — AB (ref 70–99)
GLUCOSE-CAPILLARY: 154 mg/dL — AB (ref 70–99)
GLUCOSE-CAPILLARY: 167 mg/dL — AB (ref 70–99)
Glucose-Capillary: 128 mg/dL — ABNORMAL HIGH (ref 70–99)
Glucose-Capillary: 151 mg/dL — ABNORMAL HIGH (ref 70–99)
Glucose-Capillary: 120 mg/dL — ABNORMAL HIGH (ref 70–99)

## 2018-08-16 LAB — MAGNESIUM: Magnesium: 1.9 mg/dL (ref 1.7–2.4)

## 2018-08-16 LAB — PROTIME-INR
INR: 1.25
Prothrombin Time: 15.6 seconds — ABNORMAL HIGH (ref 11.4–15.2)

## 2018-08-16 LAB — HEPARIN LEVEL (UNFRACTIONATED): HEPARIN UNFRACTIONATED: 0.51 [IU]/mL (ref 0.30–0.70)

## 2018-08-16 NOTE — Progress Notes (Signed)
PROGRESS NOTE    Alexander Fry  TFT:[id number] DOB: 07/05/1961 DOA: 08/06/2018 PCP: [name], PA-C    Brief [name] y.o.male,w hx of metastatic renal cell carcinoma (bone, brain, lung), h/o R IJ thrombus, Iron deficiency anemia, w recent admission for abdominal pain where he was found to have duodenitis. Pt apparently went home and continued to have abdominal pain which was worse yesterday associated with some n/v. Pt denies fever, chills, diarrhea, brbpr, black stool. Pt has been taking protonix. Denies NSAIDS.   Interim history Admitted for acute pancreatitis.  Gastroenterology consulted and appreciated.  Continues to have abdominal pain and is requiring both IV as well as oral pain medications quite frequently.   Assessment & Plan:   Principal Problem:   Pancreatitis Active Problems:   Transaminitis   Abdominal pain   Nausea & vomiting   Hypokalemia   Diabetes mellitus type 2 in obese (HCC)   Internal jugular (IJ) vein thromboembolism, chronic, right (HCC)   Normocytic anemia   Duodenitis   Uncontrolled pain  Acute pancreatitis -CT abdomen pelvis shows redemonstration of transmural thickening of the second and third portion of the duodenum suggestive of duodenitis, some interval decrease. -Patient continues to need both IV Dilaudid and oral oxycodone frequently. -Triglycerides 73; lipase on admission 969-down to 113 -Gastroenterology consulted and appreciated recommended conservative management -General surgery consulted and appreciated, felt patient is a poor surgical candidate -Discussed with Dr. Benson Norway, gastroenterology, recommended obtaining KUB, wonders if some of patient's abdominal pain could be narcotic related constipation.  Recommended trying Linzess as well as Fleet enema- given on 9/4. -CT abd: possibility of duodenitis, peripancreatic fluid, pancreatitis not excluded   Abdominal pain -?secondary to the above vs constipation vs referred  from renal cell -continue IV dilaudid, oxycodone -continues to have pain today, increased fentanyl patch -Continue toradol for anti-inflammatory properties  -MRCP: Acute pancreatitis, no evidence of pancreatic pseudocyst or necrosis.  Abnormal increased caliber of intrahepatic ducts; enhancement of common bile duct identified, nonspecific but may reflect cholangitis.  Gallstones and diffuse gallbladder wall thickening with enhancement.  Large right kidney mass compatible with renal cell carcinoma.  Ascites.  Constipation-after decrease the dose of MiraLAX.  Continue stool softener.  Duodenitis -Completed course of Cipro and Flagyl -Continue Protonix -CT abdomen pelvis as above  Abnormal LFTs -Trending downward -Hepatitis panel unremarkable  Hypokalemia -potassium 3.4, will replace and monitor  -magnesium was checked, 1.9 replete.  Diabetes mellitus, type II -Continue insulin sliding scale with CBG monitoring  Metastatic renal cell carcinoma -Follow-up with oncology as an outpatient -Possibly elevation in alk phos due to metastatic bone disease -Discussed with Dr. Marin Olp- if abdominal pain is referred from cancer, may need nephrectomy- recommended urology consult -discussed with patient, he feels he would like to wait on urology consult and possibly pursue this as an outpatient following a PET scan  Chronic normocytic anemia -Appears to be stable, currently 10.3  Right IJ thrombus-discussed with-Dr. Marin Olp patient is a candidate for only Coumadin or Lovenox because of abnormal liver function  currently Child Pugh Class B or C  -We will continue heparin with Lovenox until INR is therapeutic.  Right IJ thrombus was at the site of Port-A-Cath.  Lower extremity edema could be multifactorial -suspect from abdominal pathology and venous insuffiencey/albuminemia 2.3 -Do not feel Lasix is the appropriate treatment -Feel patient to keep his legs elevated and ambulate when  possible -Ted hose ordered  Goals of care -discussed with patient, agreed to palliative care consulted and appreciated- especially  for symptom management    DVT prophylaxis:heparin Code Statusfull Family Communication:none Disposition Plan: Once medically stable Consultants:  Gastroenterology, general surgery, oncology, palliative care Procedures: MRCP  antimicrobials: Ciprofloxacin Subjective: Patient sitting up by the side of the bed feels 100% better IV steroids and IV Dilaudid feels like he has probably passed a stone last night.  Abdominal pain and distention is better with IV narcotics. Objective: Vitals:   08/15/18 1210 08/15/18 2000 08/16/18 0441 08/16/18 0443  BP: (!) 142/94 128/78 121/82   Pulse: 91 83 75   Resp: 16 14 14    Temp: 98.9 F (37.2 C) 98.1 F (36.7 C) 98.1 F (36.7 C)   TempSrc: Oral Oral Oral   SpO2: 98% 93% 98%   Weight:    100.1 kg  Height:        Intake/Output Summary (Last 24 hours) at 08/16/2018 1230 Last data filed at 08/16/2018 1128 Gross per 24 hour  Intake 1439.76 ml  Output 2100 ml  Net -660.24 ml   Filed Weights   08/14/18 0453 08/15/18 0411 08/16/18 0443  Weight: 100.8 kg 101 kg 100.1 kg    Examination:  General exam: Appears calm and comfortable  Respiratory system: Clear to auscultation. Respiratory effort normal. Cardiovascular system: S1 & S2 heard, RRR. No JVD, murmurs, rubs, gallops or clicks. No pedal edema. Gastrointestinal system: Abdomen is distended, soft and nontender. No organomegaly or masses felt. Normal bowel sounds heard. Central nervous system: Alert and oriented. No focal neurological deficits. Extremities: 2 plus edema Skin: No rashes, lesions or ulcers Psychiatry: Judgement and insight appear normal. Mood & affect appropriate.     Data Reviewed: I have personally reviewed following labs and imaging studies  CBC: Recent Labs  Lab 08/12/18 0412 08/13/18 0352 08/13/18 1005 08/14/18 0638  08/15/18 0836 08/16/18 0556  WBC 7.9 7.1  --  9.2 10.6* 16.2*  HGB 11.1* 9.9* 10.6* 10.3* 10.4* 10.9*  HCT 34.2* 30.9* 33.1* 31.8* 32.6* 33.9*  MCV 76.7* 77.1*  --  77.2* 77.8* 77.2*  PLT 370 292  --  360 367 505*   Basic Metabolic Panel: Recent Labs  Lab 08/11/18 0337 08/12/18 0412 08/13/18 0352 08/14/18 0638 08/15/18 0836 08/16/18 0556  NA 139 138 137 137 133* 133*  K 3.1* 3.8 3.4* 3.5 3.2* 3.6  CL 102 102 103 100 95* 97*  CO2 29 27 28 27 27 24   GLUCOSE 117* 134* 130* 115* 113* 158*  BUN <5* <5* 5* <5* 7 10  CREATININE 0.36* 0.30* 0.38* <0.30* 0.39* 0.52*  CALCIUM 8.3* 8.7* 8.4* 8.6* 8.5* 8.9  MG 1.9  --   --   --   --  1.9   GFR: Estimated Creatinine Clearance: 123.3 mL/min (A) (by C-G formula based on SCr of 0.52 mg/dL (L)). Liver Function Tests: Recent Labs  Lab 08/11/18 0337 08/13/18 0352 08/14/18 0638 08/15/18 0836 08/16/18 0556  AST 157* 148* 127* 91* 49*  ALT 140* 125* 116* 98* 82*  ALKPHOS 916* 1,020* 1,081* 1,129* 1,062*  BILITOT 10.1* 11.8* 14.7* 16.0* 8.2*  PROT 5.2* 5.0* 5.2* 5.6* 6.1*  ALBUMIN 2.2* 2.2* 2.1* 2.2* 2.3*   Recent Labs  Lab 08/09/18 1257 08/11/18 0337 08/13/18 0352 08/15/18 0836  LIPASE 180* 139* 113* 80*   No results for input(s): AMMONIA in the last 168 hours. Coagulation Profile: Recent Labs  Lab 08/16/18 1033  INR 1.25   Cardiac Enzymes: No results for input(s): CKTOTAL, CKMB, CKMBINDEX, TROPONINI in the last 168 hours. BNP (last 3 results) No  results for input(s): PROBNP in the last 8760 hours. HbA1C: No results for input(s): HGBA1C in the last 72 hours. CBG: Recent Labs  Lab 08/15/18 1958 08/16/18 0022 08/16/18 0437 08/16/18 0732 08/16/18 1134  GLUCAP 158* 139* 128* 151* 154*   Lipid Profile: No results for input(s): CHOL, HDL, LDLCALC, TRIG, CHOLHDL, LDLDIRECT in the last 72 hours. Thyroid Function Tests: No results for input(s): TSH, T4TOTAL, FREET4, T3FREE, THYROIDAB in the last 72 hours. Anemia  Panel: No results for input(s): VITAMINB12, FOLATE, FERRITIN, TIBC, IRON, RETICCTPCT in the last 72 hours. Sepsis Labs: No results for input(s): PROCALCITON, LATICACIDVEN in the last 168 hours.  No results found for this or any previous visit (from the past 240 hour(s)).       Radiology Studies: Mr 3d Recon At Scanner  Result Date: 08/14/2018 CLINICAL DATA:  Renal cell carcinoma.  Abdominal distention. EXAM: MRI ABDOMEN WITHOUT AND WITH CONTRAST (INCLUDING MRCP) TECHNIQUE: Multiplanar multisequence MR imaging of the abdomen was performed both before and after the administration of intravenous contrast. Heavily T2-weighted images of the biliary and pancreatic ducts were obtained, and three-dimensional MRCP images were rendered by post processing. CONTRAST:  10 cc of Gadavist COMPARISON:  08/07/2018 FINDINGS: Lower chest: Small bilateral pleural effusions identified. Hepatobiliary: Mild T2 hyperintense and T1 hypointense structure within right lobe of liver measures 1 cm, image 41/1100 1. No additional focal liver abnormalities identified. Stones are identified within the gallbladder. The largest measures 1.6 cm. There is mild diffuse gallbladder wall thickening. Moderate to marked intrahepatic bile duct dilatation identified. The intrahepatic ducts are dilated to the level of the central confluence, image 118/6 and image number 72/12. No discrete mass identified. The common bile duct is relatively normal in caliber measuring 6 mm with diffuse mural enhancement. The caliber of the common bile duct becomes diminutive at the level of the head of pancreas. No choledocholithiasis identified. Pancreas: Mild diffuse pancreatic edema and peripancreatic fat stranding and free fluid is noted compatible with acute pancreatitis. No pancreatic mass identified. No evidence for pancreatic necrosis or pseudocyst formation. Spleen:  Within normal limits in size and appearance. Adrenals/Urinary Tract: Normal appearance of  the right adrenal gland. Small left adrenal gland nodule measuring 8 mm is nonspecific. Simple appearing cyst arises from upper pole of left kidney measuring 2.8 cm. Large complex enhancing solid mass arising from inferior pole of right kidney is again noted measuring 10 cm, image 95/1101, compatible with a renal cell carcinoma. Small right renal veins appear patent. No tumor thrombus identified within the IVC. Stomach/Bowel: The stomach is nondistended. Wall thickening involving the second and third portions of the duodenum identified. Vascular/Lymphatic: Normal appearance of the abdominal aorta. Enlarged abdominal lymph nodes are identified compatible with metastatic adenopathy. Index left retroperitoneal node measures 1.8 cm, image 89/1100 1. Prominent bilateral retrocrural lymph nodes are also noted and worrisome for metastatic disease. Index node on the right measures 1.2 cm, image 44/1100 1. Within the chest there is an enlarged subcarinal node measuring 2.4 cm. Other: Moderate volume of ascites identified within the upper abdomen. Increased in volume from recent CT. Musculoskeletal: Enhancing bone lesions are identified involving L1 and L4 vertebra. IMPRESSION: 1. Imaging findings compatible with acute pancreatitis. No evidence for pancreatic pseudocyst or necrosis. 2. There is abnormal increase caliber of the intrahepatic ducts to the level of the confluence with a relative normal caliber common bile duct. Enhancement of the common bile duct is identified, nonspecific but may reflect cholangitis. No choledocholithiasis or obstructing mass noted.  3. Gallstones and diffuse gallbladder wall thickening with enhancement. Cannot rule out cholecystitis. 4. Large right kidney mass compatible with renal cell carcinoma. There is evidence of nodal metastasis within the subcarinal region of the mediastinum, retrocrural stations and upper abdomen. Enhancing lesions within the lumbar spine compatible with bone metastases.  5. Ascites.  Increased from previous exam. 6. Wall thickening of the second and third portions of the duodenum which may reflect duodenitis. This may reflect primary duodenitis or secondary inflammation due to pancreatitis. Electronically Signed   By: Kerby Moors M.D.   On: 08/14/2018 21:33   Mr Abdomen Mrcp Moise Boring Contast  Result Date: 08/14/2018 CLINICAL DATA:  Renal cell carcinoma.  Abdominal distention. EXAM: MRI ABDOMEN WITHOUT AND WITH CONTRAST (INCLUDING MRCP) TECHNIQUE: Multiplanar multisequence MR imaging of the abdomen was performed both before and after the administration of intravenous contrast. Heavily T2-weighted images of the biliary and pancreatic ducts were obtained, and three-dimensional MRCP images were rendered by post processing. CONTRAST:  10 cc of Gadavist COMPARISON:  08/07/2018 FINDINGS: Lower chest: Small bilateral pleural effusions identified. Hepatobiliary: Mild T2 hyperintense and T1 hypointense structure within right lobe of liver measures 1 cm, image 41/1100 1. No additional focal liver abnormalities identified. Stones are identified within the gallbladder. The largest measures 1.6 cm. There is mild diffuse gallbladder wall thickening. Moderate to marked intrahepatic bile duct dilatation identified. The intrahepatic ducts are dilated to the level of the central confluence, image 118/6 and image number 72/12. No discrete mass identified. The common bile duct is relatively normal in caliber measuring 6 mm with diffuse mural enhancement. The caliber of the common bile duct becomes diminutive at the level of the head of pancreas. No choledocholithiasis identified. Pancreas: Mild diffuse pancreatic edema and peripancreatic fat stranding and free fluid is noted compatible with acute pancreatitis. No pancreatic mass identified. No evidence for pancreatic necrosis or pseudocyst formation. Spleen:  Within normal limits in size and appearance. Adrenals/Urinary Tract: Normal appearance of the  right adrenal gland. Small left adrenal gland nodule measuring 8 mm is nonspecific. Simple appearing cyst arises from upper pole of left kidney measuring 2.8 cm. Large complex enhancing solid mass arising from inferior pole of right kidney is again noted measuring 10 cm, image 95/1101, compatible with a renal cell carcinoma. Small right renal veins appear patent. No tumor thrombus identified within the IVC. Stomach/Bowel: The stomach is nondistended. Wall thickening involving the second and third portions of the duodenum identified. Vascular/Lymphatic: Normal appearance of the abdominal aorta. Enlarged abdominal lymph nodes are identified compatible with metastatic adenopathy. Index left retroperitoneal node measures 1.8 cm, image 89/1100 1. Prominent bilateral retrocrural lymph nodes are also noted and worrisome for metastatic disease. Index node on the right measures 1.2 cm, image 44/1100 1. Within the chest there is an enlarged subcarinal node measuring 2.4 cm. Other: Moderate volume of ascites identified within the upper abdomen. Increased in volume from recent CT. Musculoskeletal: Enhancing bone lesions are identified involving L1 and L4 vertebra. IMPRESSION: 1. Imaging findings compatible with acute pancreatitis. No evidence for pancreatic pseudocyst or necrosis. 2. There is abnormal increase caliber of the intrahepatic ducts to the level of the confluence with a relative normal caliber common bile duct. Enhancement of the common bile duct is identified, nonspecific but may reflect cholangitis. No choledocholithiasis or obstructing mass noted. 3. Gallstones and diffuse gallbladder wall thickening with enhancement. Cannot rule out cholecystitis. 4. Large right kidney mass compatible with renal cell carcinoma. There is evidence of nodal metastasis  within the subcarinal region of the mediastinum, retrocrural stations and upper abdomen. Enhancing lesions within the lumbar spine compatible with bone metastases. 5.  Ascites.  Increased from previous exam. 6. Wall thickening of the second and third portions of the duodenum which may reflect duodenitis. This may reflect primary duodenitis or secondary inflammation due to pancreatitis. Electronically Signed   By: Kerby Moors M.D.   On: 08/14/2018 21:33        Scheduled Meds: . diclofenac sodium  2 g Topical QID  . docusate sodium  100 mg Oral BID  . feeding supplement  1 Container Oral TID BM  . fentaNYL  25 mcg Transdermal Q72H  . insulin aspart  0-9 Units Subcutaneous Q4H  . linaclotide  145 mcg Oral QAC breakfast  . methylPREDNISolone (SOLU-MEDROL) injection  80 mg Intravenous Q12H  . metoCLOPramide  10 mg Oral QID  . naloxegol oxalate  25 mg Oral Daily  . pantoprazole  40 mg Oral BID  . peg 3350 powder  0.5 kit Oral Once  . polyethylene glycol  34 g Oral TID  . sucralfate  1 g Oral TID WC & HS   Continuous Infusions: . sodium chloride Stopped (08/10/18 1357)  . dextrose 5 % and 0.9% NaCl 10 mL/hr at 08/14/18 1957  . heparin 1,400 Units/hr (08/16/18 0026)     LOS: 9 days     Georgette Shell, MD [facility] If 7PM-7AM, please contact night-coverage [email or web address] Password [email or web address] 08/16/2018, 12:30 PM

## 2018-08-16 NOTE — Progress Notes (Signed)
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 Feraheme [Ferumoxytol] Anaphylaxis and Other (See Comments)    Flush. Patient denies anaphylaxis.    Patient Measurements: Height: 6' (182.9 cm) Weight: 220 lb 9.6 oz (100.1 kg) IBW/kg (Calculated) : 77.6 Heparin Dosing Weight: 97 kg  Vital Signs: Temp: 98.1 F (36.7 C) (09/11 0441) Temp Source: Oral (09/11 0441) BP: 121/82 (09/11 0441) Pulse Rate: 75 (09/11 0441)  Labs: Recent Labs    08/14/18 6948  08/14/18 1545 08/15/18 0240 08/15/18 0836 08/15/18 1414 08/16/18 0556  HGB 10.3*  --   --   --  10.4*  --  10.9*  HCT 31.8*  --   --   --  32.6*  --  33.9*  PLT 360  --   --   --  367  --  489*  APTT  --   --  36 117*  --   --   --   HEPARINUNFRC  --    < > 1.06* 0.76*  --  0.55 0.51  CREATININE <0.30*  --   --   --  0.39*  --  0.52*   < > = values in this interval not displayed.    Estimated Creatinine Clearance: 123.3 mL/min (A) (by C-G formula based on SCr of 0.52 mg/dL (L)).   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 GI is currently seeing patient for duodentitis/duodenal ulcers (does not suspect pt has pancreatis).  Xarelto was resumed on 9/2.  Per Dr. Antonieta Pert request, To transition patient to heparin drip on 9/9.  Significant Events: - CCS indicated that patient is not a candidate for cholecystectomy at this time d/t duodenitis - MRCP on 9/9 showed Acute pancreatitis, no evidence of pancreatic pseudocyst or necrosis. Large kidney mass compatible with renal call carcinoma. ascites  Today, 08/16/2018: - HL = 0.51 is therapeutic - Hgb 10.3, Plt 360 are stable - No signs/symptoms of bleeding per RN - AST/ALT elevated but trending down  -  Alk phos elevated, TBili trending up but decreased from yesterday (8.2) - Pt's child pugh score is B-C.  Of note, Xarelto should be avoided in pts with child pugh score of B or C.  Goal of Therapy:  Heparin level 0.3-0.7 units/ml Monitor platelets by anticoagulation protocol: Yes   Plan:   Continue heparin infusion at current rate of 1400 units/hr  Monitor CBC and HL daily  Continue to monitor for any signs/symptoms of bleeding  Lenis Noon, PharmD, BCPS Clinical Pharmacist 08/16/2018,7:04 AM

## 2018-08-16 NOTE — Care Management Note (Signed)
Case Management Note  Patient Details  Name: Alexander Fry MRN: [id number] Date of Birth: 1961-01-17  Subjective/Objective:Will check benefit for eliquis/apixaban 2.5mg  po bid-await insurance response.                    Action/Plan:d/c home.   Expected Discharge Date:  08/10/18               Expected Discharge Plan:  Home/Self Care  In-House Referral:     Discharge planning Services  CM Consult, Other - See comment(benefit check-eliquis)  Post Acute Care Choice:    Choice offered to:     DME Arranged:    DME Agency:     HH Arranged:    HH Agency:     Status of Service:  In process, will continue to follow  If discussed at Long Length of Stay Meetings, dates discussed:    Additional Comments:  Dessa Phi, RN 08/16/2018, 9:38 AM

## 2018-08-16 NOTE — Care Management Note (Signed)
Case Management Note  Patient Details  Name: Alexander Fry MRN: [id number] Date of Birth: [birth year]  Subjective/Objective: No co pay for eliquis or enoxaparin-attending/patient informed.                   Action/Plan:d/c home.   Expected Discharge Date:  08/10/18               Expected Discharge Plan:  Home/Self Care  In-House Referral:     Discharge planning Services  CM Consult, Other - See comment(benefit check-eliquis)  Post Acute Care Choice:    Choice offered to:     DME Arranged:    DME Agency:     HH Arranged:    HH Agency:     Status of Service:  In process, will continue to follow  If discussed at Long Length of Stay Meetings, dates discussed:    Additional Comments:  Dessa Phi, RN 08/16/2018, 3:28 PM

## 2018-08-16 NOTE — Consult Note (Addendum)
Consultation Note Date: 08/15/2018  Patient Name: Alexander Fry  DOB: July 02, 1961  MRN: [id number]  Age / Sex: 57 y.o., male  PCP: [name], PA-C Referring Physician: Georgette Shell, MD  Reason for Consultation: Establishing goals of care and Pain control  HPI/Patient Profile: 57 y.o. male  with past medical history of metastatic renal cell carcinoma (bone, brain, lung) undergoing treatment with Dr. Marin Olp, h/o right IJ thrombus, iron deficiency anemia, recent admission for abdominal pain and found to have duodenitis admitted on 08/06/2018 with abd pain and constipation.   Clinical Assessment and Goals of Care: I have met with Alexander Fry who is a very pleasant gentleman as well as his nephew at bedside. He is also a very complicated gentleman with many health issues currently including poorly controlled pain. Overall his pain has been better controlled today not increasing above 5-6/10 but is still requiring frequent IV dilaudid doses to maintain pain at this adequate level for him with goal 3/10. Pain is acute on chronic in right lower back stabbing pain slightly tender to palpation in certain areas. This pain will on occasion radiate around to abd. Plan to continue to titrate fentanyl patch to maintain tolerable pain and to transition potentially to oral dilaudid for breakthrough pain once need for IV dilaudid decreases hopefully from fentanyl patch titration.   Hospitalization complicated by acute pancreatitis and rising bilirubin which complicates situation as he is not a surgical candidate to remove gallbladder or kidney per surgical consult given inflammation. Mr. Sheerin agrees that if the surgery is very risky he would not want "to be cut on" anyway. I gently explained to them that I am worried if we cannot reverse the cause and his bilirubin continues to worsen where this will lead [name]. His nephew  does share that through all he has been through that this is the worst.   Answered all questions/concerns. Emotional support provided. I will continue to follow and assist with pain management as well as GOC. Goals today were to introduce palliative care and build rapport. I will follow up tomorrow.   Discussed with Dr. Ree Kida.   Primary Decision Maker PATIENT    SUMMARY OF RECOMMENDATIONS   - Discussed plan for pain medication titration - anticipate increasing fentanyl patch tomorrow (just increased today so will not change again today) - Monitor bilirubin closely, he is very jaundice, I worry about options to manage this and believe he could decline quickly  Code Status/Advance Care Planning:  Full code - I did not discuss today   Symptom Management:   Bowel Regimen: GI following. Aggressive regimen currently with Miralax TID, Linzess, colace. Having some diarrhea today. Consider decreasing Miralax to BID and half dose. Discussed need for bowel regimen as long as he is on pain medication.   Pain right low back acute on chronic: Stabbing pain. Goal 3/10.   Fentanyl patch 25 mcg/hr added today. Anticipate increase tomorrow.  Continue dilaudid 1 mg IV every 2 hours prn.   Agree with steroids added/managed by Dr.  Ennever (also wait for increase fentanyl to see if steroids will help overall pain control).   Consider gabapentin.   Palliative Prophylaxis:   Bowel Regimen, Delirium Protocol, Frequent Pain Assessment and Turn Reposition  Additional Recommendations (Limitations, Scope, Preferences):  Full Scope Treatment  Psycho-social/Spiritual:   Desire for further Chaplaincy support:yes  Additional Recommendations: Caregiving  Support/Resources  Prognosis:   Depending on progression and options over the next few days.   Discharge Planning: To be determined.      Primary Diagnoses: Present on Admission: . Pancreatitis . Abdominal pain . Nausea & vomiting .  Transaminitis   I have reviewed the medical record, interviewed the patient and family, and examined the patient. The following aspects are pertinent.  Past Medical History:  Diagnosis Date  . Bilateral primary osteoarthritis of knee 04/03/2018  . Duodenitis   . Goals of care, counseling/discussion 12/22/2016  . History of radiation therapy 02/11/2017   SRT right posterior frontal 12 mm target 20 Gy, Right anterior frontal 39m 20 Gy  . History of radiation therapy 02/21/2017   SRT Right post parietal 459mtarget 20 Gy, left Post parietal 73m62m0 Gy, Right Occipital 4 mm 20Gy, Left Occipital 4 mm 20 Gy  . History of radiation therapy 02/25/2017, 02/28/2017, 03/02/2017   SRT L1 spine 27 Gy 3 fractions, L4 Spine 27 Gy 3 fractions  . History of radiation therapy 06/07/2017   SRS- Brain  . History of radiation therapy 04/03/2018   Right ilium, 8 Gy in 1 fraction for a total dose of 8 Gy  . Inguinal hernia of left side without obstruction or gangrene   . Iron deficiency anemia due to chronic blood loss 05/23/2018  . Pneumonia    left lung  . Renal cell carcinoma, right [place]/[place]  . 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 His vision has "spots" at times   Social History   Socioeconomic History  . Marital status: Single    Spouse name: Not on file  . Number of children: Not on file  . Years of education: Not on file  . Highest education level: Not on file  Occupational History  . Not on file  Social Needs  . Financial resource strain: Not on file  . Food insecurity:    Worry: Not on file    Inability: Not on file  . Transportation needs:    Medical: Not on file    Non-medical: Not on file  Tobacco Use  . Smoking status: Never Smoker  . Smokeless tobacco: Never Used  Substance and Sexual Activity  . Alcohol use: Yes    Comment: social use in the past  . Drug use: No  . Sexual activity: Not Currently  Lifestyle  . Physical activity:    Days per week:  Not on file    Minutes per session: Not on file  . Stress: Not on file  Relationships  . Social connections:    Talks on phone: Not on file    Gets together: Not on file    Attends religious service: Not on file    Active member of club or organization: Not on file    Attends meetings of clubs or organizations: Not on file    Relationship status: Not on file  Other Topics Concern  . Not on file  Social History Narrative  . Not on file   Family History  Problem Relation Age of Onset  . Heart disease Mother   .  Diabetes Mother   . Heart disease Father   . Alcohol abuse Brother    Scheduled Meds: . diclofenac sodium  2 g Topical QID  . docusate sodium  100 mg Oral BID  . feeding supplement  1 Container Oral TID BM  . fentaNYL  25 mcg Transdermal Q72H  . insulin aspart  0-9 Units Subcutaneous Q4H  . linaclotide  145 mcg Oral QAC breakfast  . methylPREDNISolone (SOLU-MEDROL) injection  80 mg Intravenous Q12H  . metoCLOPramide  10 mg Oral QID  . naloxegol oxalate  25 mg Oral Daily  . pantoprazole  40 mg Oral BID  . peg 3350 powder  0.5 kit Oral Once  . polyethylene glycol  34 g Oral TID  . sucralfate  1 g Oral TID WC & HS   Continuous Infusions: . sodium chloride Stopped (08/10/18 1357)  . dextrose 5 % and 0.9% NaCl 10 mL/hr at 08/14/18 1957  . heparin 1,400 Units/hr (08/16/18 0026)   PRN Meds:.sodium chloride, acetaminophen **OR** acetaminophen, HYDROmorphone (DILAUDID) injection, ondansetron (ZOFRAN) IV, sodium chloride flush Allergies  Allergen Reactions  . Feraheme [Ferumoxytol] Anaphylaxis and Other (See Comments)    Flush. Patient denies anaphylaxis.   Review of Systems  Constitutional: Positive for activity change, appetite change and fatigue.  Gastrointestinal: Positive for abdominal distention and abdominal pain.  Musculoskeletal: Positive for back pain.  Neurological: Positive for weakness.    Physical Exam  Constitutional: He is oriented to person, place,  and time. He appears well-developed. He appears ill.  Jaundice   HENT:  Head: Normocephalic and atraumatic.  Cardiovascular: Normal rate.  Pulmonary/Chest: Effort normal. No accessory muscle usage. No tachypnea. No respiratory distress.  Abdominal: Normal appearance.  Neurological: He is alert and oriented to person, place, and time.  Nursing note and vitals reviewed.   Vital Signs: BP 121/82 (BP Location: Right Arm)   Pulse 75   Temp 98.1 F (36.7 C) (Oral)   Resp 14   Ht 6' (1.829 m)   Wt 100.1 kg   SpO2 98%   BMI 29.92 kg/m  Pain Scale: 0-10 POSS *See Group Information*: 2-Acceptable,Slightly drowsy, easily aroused Pain Score: 3    SpO2: SpO2: 98 % O2 Device:SpO2: 98 % O2 Flow Rate: .   IO: Intake/output summary:   Intake/Output Summary (Last 24 hours) at 08/16/2018 0806 Last data filed at 08/16/2018 0600 Gross per 24 hour  Intake 1569.76 ml  Output 1900 ml  Net -330.24 ml    LBM: Last BM Date: 08/15/18 Baseline Weight: Weight: 97.5 kg Most recent weight: Weight: 100.1 kg     Palliative Assessment/Data: 50%     Time In: 1620 Time Out: 1750 Time Total: 90 min Greater than 50%  of this time was spent counseling and coordinating care related to the above assessment and plan.  Signed by: Vinie Sill, NP Palliative Medicine Team Pager # [phone] (M-F 8a-5p) Team Phone # [phone] (Nights/Weekends)

## 2018-08-16 NOTE — Progress Notes (Signed)
Palliative:  I have had a wonderful conversation today with Alexander Fry. 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 He shares that he enjoyed his job and that this past position has included traveling which he enjoyed domestic and global travel. He shares some of the world views and values that he has gained from his travel experience.   Alexander Fry also shares his experience with dealing with his father's death last year. He has learned much from dealing with his siblings and trying to ensure his mother is having her needs provided for that he has applied to his own situation with his three children. He says that in June he has had conversations with his children regarding where he is in his cancer and health. He expressed to them that he would not want to be on life support. I confirmed DNR with him and he tearfully confirms that this is his wish as "I saw what my father went through." He also doesn't want his children to suffer by seeing him in this condition. I reassured him that this does not mean that we cannot do everything possible to prevent [name] from reaching the point of resuscitation but it is important and brave that he has considered and even expressed to his children this decision.   We went on talking further of various topics but Alexander Fry is coming to terms with his mortality while at the same time hoping for more time. Therapeutic listening and emotional support provided.   As far as pain management he feels much improved today and is utilizing less IV dilaudid. However, he is fearful of waiting too long before taking dilaudid that his pain will become severe again. He requests to transition to po dilaudid tomorrow. I do not feel that we need to increase fentanyl patch at this time. For now we will continue current regimen.   Exam: Alert, oriented. No distress. Less jaundice today. Visibly  feels better as he is more mobile and active during my visit. Abd appears non-distended.   Plan:  DNR confirmed  Bowel Regimen: GI following. Aggressive regimen currently with Miralax TID, Linzess, colace. Having some diarrhea today. Consider decreasing Miralax to BID and half dose. Discussed need for bowel regimen as long as he is on pain medication. He continues to have some liquid stools today.   Pain right low back acute on chronic: Stabbing pain. Goal 3/10.  ? Fentanyl patch 25 mcg/hr added 9/10.  ? Continue dilaudid 1 mg IV every 2 hours prn.  ? Agree with steroids added/managed by Dr. Marin Olp (also wait for increase fentanyl to see if steroids will help overall pain control).  ? Could consider gabapentin.  [id number] 440 min  Vinie Sill, NP Palliative Medicine Team Pager # [phone] (M-F 8a-5p) Team Phone # [phone] (Nights/Weekends)

## 2018-08-16 NOTE — Progress Notes (Signed)
Alexander Fry is feeling a lot better.  I had believe that the steroids are probably helping out right now.  He feels that he may have passed a couple stones last night.  He lacks a lot of pain in his right groin area.  I appreciate palliative care coming to see him.  His bilirubin still is going up.  I still have to believe that this is from the gallbladder.  I still think that he will eventually be a surgical candidate for his gallbladder to be taken out.  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  Despite the high bilirubin, he does not look as jaundiced.  He is now on Solu-Medrol.  I think this is helping him.  His appetite is doing better.  He is eating better.  I am just happy that he is feeling better.  Pain seems to be under much better control.  Is on a fentanyl patch.  We had him on a fentanyl patch before.    We will continue to follow along.  Again, I still believe that at some point he is going to need to have surgery.  It seems like this duodenitis is the only thing that is preventing surgery from happening for his gallbladder.  Lattie Haw, MD  Proverbs 18:10

## 2018-08-17 ENCOUNTER — Inpatient Hospital Stay (HOSPITAL_COMMUNITY): Payer: BLUE CROSS/BLUE SHIELD

## 2018-08-17 DIAGNOSIS — Z789 Other specified health status: Secondary | ICD-10-CM

## 2018-08-17 LAB — COMPREHENSIVE METABOLIC PANEL
ALT: 60 U/L — AB (ref 0–44)
AST: 27 U/L (ref 15–41)
Albumin: 2.3 g/dL — ABNORMAL LOW (ref 3.5–5.0)
Alkaline Phosphatase: 875 U/L — ABNORMAL HIGH (ref 38–126)
Anion gap: 13 (ref 5–15)
BUN: 13 mg/dL (ref 6–20)
CO2: 26 mmol/L (ref 22–32)
CREATININE: 0.57 mg/dL — AB (ref 0.61–1.24)
Calcium: 9.2 mg/dL (ref 8.9–10.3)
Chloride: 99 mmol/L (ref 98–111)
Glucose, Bld: 141 mg/dL — ABNORMAL HIGH (ref 70–99)
Potassium: 4 mmol/L (ref 3.5–5.1)
Sodium: 138 mmol/L (ref 135–145)
Total Bilirubin: 6 mg/dL — ABNORMAL HIGH (ref 0.3–1.2)
Total Protein: 6.1 g/dL — ABNORMAL LOW (ref 6.5–8.1)

## 2018-08-17 LAB — CBC
HEMATOCRIT: 33.2 % — AB (ref 39.0–52.0)
HEMOGLOBIN: 10.7 g/dL — AB (ref 13.0–17.0)
MCH: 25.6 pg — AB (ref 26.0–34.0)
MCHC: 32.2 g/dL (ref 30.0–36.0)
MCV: 79.4 fL (ref 78.0–100.0)
Platelets: 447 10*3/uL — ABNORMAL HIGH (ref 150–400)
RBC: 4.18 MIL/uL — ABNORMAL LOW (ref 4.22–5.81)
RDW: 21 % — ABNORMAL HIGH (ref 11.5–15.5)
WBC: 19.9 10*3/uL — ABNORMAL HIGH (ref 4.0–10.5)

## 2018-08-17 LAB — HEPARIN LEVEL (UNFRACTIONATED): Heparin Unfractionated: 0.4 IU/mL (ref 0.30–0.70)

## 2018-08-17 LAB — GLUCOSE, CAPILLARY
GLUCOSE-CAPILLARY: 84 mg/dL (ref 70–99)
Glucose-Capillary: 134 mg/dL — ABNORMAL HIGH (ref 70–99)
Glucose-Capillary: 121 mg/dL — ABNORMAL HIGH (ref 70–99)
Glucose-Capillary: 99 mg/dL (ref 70–99)
Glucose-Capillary: 119 mg/dL — ABNORMAL HIGH (ref 70–99)

## 2018-08-17 MED ORDER — FENTANYL 12 MCG/HR TD PT72: 50.0000 ug | MEDICATED_PATCH | TRANSDERMAL | Status: DC

## 2018-08-17 MED ORDER — IOPAMIDOL (ISOVUE-300) INJECTION 61%: 15.0000 mL | Freq: Once | INTRAVENOUS | Status: DC | PRN

## 2018-08-17 MED ORDER — FUROSEMIDE 10 MG/ML IJ SOLN
20.0000 mg | Freq: Once | INTRAMUSCULAR | Status: AC
  Administered 2018-08-17: 20 mg via INTRAVENOUS
  Filled 2018-08-17: qty 2

## 2018-08-17 MED ORDER — RIVAROXABAN 10 MG PO TABS
10.0000 mg | ORAL_TABLET | Freq: Every day | ORAL | Status: DC
  Administered 2018-08-17 – 2018-08-28 (×12): 10 mg via ORAL
  Filled 2018-08-17 (×12): qty 1

## 2018-08-17 MED ORDER — SIMETHICONE 80 MG PO CHEW
80.0000 mg | CHEWABLE_TABLET | Freq: Four times a day (QID) | ORAL | Status: DC | PRN
  Administered 2018-08-18 – 2018-08-28 (×27): 80 mg via ORAL
  Filled 2018-08-17 (×27): qty 1

## 2018-08-17 MED ORDER — IOHEXOL 300 MG/ML  SOLN
100.0000 mL | Freq: Once | INTRAMUSCULAR | Status: AC | PRN
  Administered 2018-08-17: 100 mL via INTRAVENOUS

## 2018-08-17 MED ORDER — RIVAROXABAN 10 MG PO TABS: 10.0000 mg | ORAL_TABLET | Freq: Every day | ORAL | Status: DC

## 2018-08-17 MED ORDER — ALUM & MAG HYDROXIDE-SIMETH 200-200-20 MG/5ML PO SUSP
30.0000 mL | ORAL | Status: DC | PRN
  Administered 2018-08-17 – 2018-08-18 (×2): 30 mL via ORAL
  Filled 2018-08-17 (×2): qty 30

## 2018-08-17 MED ORDER — FENTANYL 50 MCG/HR TD PT72
50.0000 ug | MEDICATED_PATCH | TRANSDERMAL | Status: DC
  Administered 2018-08-17: 50 ug via TRANSDERMAL
  Filled 2018-08-17: qty 1

## 2018-08-17 MED ORDER — IOPAMIDOL (ISOVUE-300) INJECTION 61%
INTRAVENOUS | Status: AC
  Filled 2018-08-17: qty 30

## 2018-08-17 MED ORDER — PREDNISONE 20 MG PO TABS
60.0000 mg | ORAL_TABLET | Freq: Every day | ORAL | Status: DC
  Administered 2018-08-17 – 2018-08-21 (×5): 60 mg via ORAL
  Filled 2018-08-17 (×5): qty 3

## 2018-08-17 NOTE — Progress Notes (Signed)
Alexander Fry is having some abdominal fullness this morning.  He has had movements.  His bilirubin is come down incredibly well.  I do not know if he may have passed stone.  I will know if the prednisone may be helping.  His bilirubin went from 16 down to 8 yesterday.  Today, the bilirubin is 6.  His alkaline phosphatase is also improving.  His SGPT/SGOT are also improving.  I think it would be worthwhile getting a CT scan of his abdomen to see how everything looks with that duodenitis.  I see no reason why cannot be put back on Xarelto.  He does not have liver failure.  I think Xarelto would be perfect for him.  He is eating a little bit more.  Very said he had quite a few bowel movements yesterday.  He has had no fever.  There is been no bleeding.  All of his vital signs look stable.  His temperature is 98.2.  Pulse 80.  Blood pressure 125/77.  His abdomen is slightly distended.  Bowel sounds are present.  There is some slight epigastric tenderness to palpation.  There is no fluid wave.  There is no hepatomegaly.  Lungs are clear.  Cardiac exam regular rate and rhythm.  Extremities shows some 1+ edema.  Neurological exam is nonfocal.  Hopefully, Alexander Fry is "turning the corner."  I do not know if this is just the natural order of his disease.  I will know if he has passed a gallstone.  I will know if the steroids are doing the job.  I do think a CT scan would be worthwhile to get again.  If there is no duodenitis, I still think that it would be worthwhile to get his gallbladder taken out since there are stones.  I appreciate the great care that he is getting from everybody up on 4 E.  Lattie Haw, MD  Darlyn Chamber 30:17

## 2018-08-17 NOTE — Progress Notes (Signed)
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 Feraheme [Ferumoxytol] Anaphylaxis and Other (See Comments)    Flush. Patient denies anaphylaxis.    Patient Measurements: Height: 6' (182.9 cm) Weight: 222 lb 4.8 oz (100.8 kg) IBW/kg (Calculated) : 77.6 Heparin Dosing Weight: 97 kg  Vital Signs: Temp: 98.2 F (36.8 C) (09/12 0407) Temp Source: Oral (09/12 0407) BP: 125/77 (09/12 0407) Pulse Rate: 80 (09/12 0407)  Labs: Recent Labs    08/14/18 1545 08/15/18 0240  08/15/18 0836 08/15/18 1414 08/16/18 0556 08/16/18 1033 08/17/18 0503  HGB  --   --    < > 10.4*  --  10.9*  --  10.7*  HCT  --   --   --  32.6*  --  33.9*  --  33.2*  PLT  --   --   --  367  --  489*  --  447*  APTT 36 117*  --   --   --   --   --   --   LABPROT  --   --   --   --   --   --  15.6*  --   INR  --   --   --   --   --   --  1.25  --   HEPARINUNFRC 1.06* 0.76*  --   --  0.55 0.51  --  0.40  CREATININE  --   --   --  0.39*  --  0.52*  --  0.57*   < > = values in this interval not displayed.    Estimated Creatinine Clearance: 123.7 mL/min (A) (by C-G formula based on SCr of 0.57 mg/dL (L)).   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 GI is currently seeing patient for duodentitis/duodenal ulcers (does not suspect pt has pancreatis).  Xarelto was resumed on 9/2.  Per Dr. Ennever's request he was transitioned to heparin drip on 9/9 in anticipation of possible surgery.   Significant Events: - CCS indicated that patient is not a candidate for cholecystectomy at this time d/t duodenitis - MRCP on 9/9 showed Acute pancreatitis, no evidence of pancreatic pseudocyst or necrosis. Large kidney mass compatible with renal call  carcinoma. ascites  Today, 08/17/2018: - HL = 0.40 is therapeutic on heparin infusion of 1400 units/hr - Hgb 10.7, Plt 447 are stable - No signs/symptoms of bleeding per RN - AST/ALT elevated but trending down  - Alk phos elevated, TBili remains elevated but continues to trend down  - Pt's child pugh score is B/C.  Of note, Xarelto should be avoided in pts with child pugh score of B or C  Goal of Therapy:  Heparin level 0.3-0.7 units/ml Monitor platelets by anticoagulation protocol: Yes   Plan:   Per oncology, anticoagulation is being transitioned from heparin infusion to rivaroxaban 10 mg PO daily. Confirmed with oncologist that they would like to continue with rivaroxaban 10 mg PO dose despite Child-Pugh score of B/C.   Heparin drip discontinued, pharmacy signing off.   Mary M Swayne, PharmD, BCPS Clinical Pharmacist 08/17/2018,10:47 AM  

## 2018-08-17 NOTE — Progress Notes (Addendum)
PROGRESS NOTE    Alexander Fry  PQZ:[id number] DOB: [birth year] DOA: 08/06/2018 PCP: [name], PA-C Brief Alexander Fry y.o.male,w hx of metastatic renal cell carcinoma (bone, brain, lung), h/o R IJ thrombus, Iron deficiency anemia, w recent admission for abdominal pain where he was found to have duodenitis. Pt apparently went home and continued to have abdominal pain which was worse yesterday associated with some n/v. Pt denies fever, chills, diarrhea, brbpr, black stool. Pt has been taking protonix. Denies NSAIDS.   Interim history Admitted for acute pancreatitis. Gastroenterology consulted and appreciated. Continues to have abdominal pain and is requiring both IV as well as oral pain medications quite frequently.  Assessment & Plan:   Principal Problem:   Pancreatitis Active Problems:   Transaminitis   Abdominal pain   Nausea & vomiting   Hypokalemia   Diabetes mellitus type 2 in obese (HCC)   Internal jugular (IJ) vein thromboembolism, chronic, right (HCC)   Normocytic anemia   Duodenitis   Uncontrolled pain   Metastatic renal cell carcinoma (HCC)  Acute pancreatitis-resolving patient able to tolerate p.o. intake.  Today he had more complaints of nausea compared to yesterday.  Has not had a bowel movement today but had multiple bowel movements yesterday.  Follow-up CT scan today. Constipation-after decrease the dose of MiraLAX.  Continue stool softener. Worsening leukocytosis could be secondary to IV steroids since patient is clinically getting better.  ADDENDUM CT ABDOMEN -Dilated loops of proximal small bowel without an obvious cause. There a transition in the mid small-bowel between dilated and nondilated loops. The appearance could be due to ileus perhaps related to pancreatitis. Moderately distended stomach. 2. Continued wall thickening in the proximal duodenum compatible with duodenitis. 3. Increase in fluid along the lesser sac and mild increase  in ascites. There is also increased infiltrative edema in the omentum which could be inflammatory or less likely due to peritoneal spread of tumor. 4. Stable degree of intrahepatic biliary dilatation. The extrahepatic biliary tree is markedly narrowed, possibly due to extrinsic narrowing related to pancreatitis, or possibly from cholangitis. 5. Gallstones are observed. The right kidney lower pole mass, metastatic adenopathy, and osseous metastatic disease all appear stable compared to recent CT scans. 6. There is no evidence of pancreatic abscess, necrosis, or pseudocyst. 7. Subcarinal and left infrahilar adenopathy appears stable. 8. 1.2 cm right hepatic lobe lesion. This was previously much larger and hypermetabolic on remote prior PET-CT but more recently the hypermetabolic activity had resolved and the lesion was notably smaller than on 01/03/2017. 9. There is some mild thickening of the left adrenal gland which merits surveillance.  Duodenitis -Completed course of Cipro and Flagyl -Continue Protonix -CT abdomen pelvis as above  Abnormal LFTs -Trending downward -Hepatitis panel unremarkable -Patient does not want cholecystectomy done at this time.  Hypokalemia-resolved  Diabetes mellitus, type II -Continue insulin sliding scale with CBG monitoring  Metastatic renal cell carcinoma -Follow-up with oncology as an outpatient -Possibly elevation in alk phos due to metastatic bone disease alkaline phosphatase decreasing. -Discussed with Dr. Marin Olp- if abdominal pain is referred from cancer, may need nephrectomy- recommended urology consult -discussed with patient, he feels he would like to wait on urology consult and possibly pursue this as an outpatient following a PET scan  Chronic normocytic anemia -Appears to be stable, currently 10.3  Right IJ thrombus-and number would like patient to be started on Xarelto 10 mg daily.  Patient does not want to be on Lovenox and  does not want to pick himself  at home.    Lower extremity edema could be multifactorial -suspect from abdominal pathology and venous insuffiencey/albuminemia 2.3 -Do not feel Lasix is the appropriate treatment -Feel patient to keep his legs elevated and ambulate when possible -Ted hose ordered  Goals of care -discussed with patient, agreed to palliative care consulted and appreciated- especially for symptom management   DVT prophylaxis: Xarelto Code Status full code Family Communication: None Disposition Plan: Pending clinical improvement  Consultants:  GI, oncology, general surgery, palliative care  Procedures: MRCP Antimicrobials: Subjective: Patient sitting up in chair complaining of nausea denied any vomiting or diarrhea.   Objective: Vitals:   08/16/18 0443 08/16/18 1616 08/16/18 1959 08/17/18 0407  BP:  135/81 136/77 125/77  Pulse:  80 77 80  Resp:   18 18  Temp:  98 F (36.7 C) 98.7 F (37.1 C) 98.2 F (36.8 C)  TempSrc:  Oral Oral Oral  SpO2:  98% 98% 94%  Weight: 100.1 kg   100.8 kg  Height:        Intake/Output Summary (Last 24 hours) at 08/17/2018 1327 Last data filed at 08/17/2018 0623 Gross per 24 hour  Intake 520 ml  Output 1375 ml  Net -855 ml   Filed Weights   08/15/18 0411 08/16/18 0443 08/17/18 0407  Weight: 101 kg 100.1 kg 100.8 kg    Examination:  General exam: Appears calm and comfortable  Respiratory system: Clear to auscultation. Respiratory effort normal. Cardiovascular system: S1 & S2 heard, RRR. No JVD, murmurs, rubs, gallops or clicks. No pedal edema. Gastrointestinal system: Abdomen is nondistended, soft and nontender. No organomegaly or masses felt. Normal bowel sounds heard. Central nervous system: Alert and oriented. No focal neurological deficits. Extremities: Symmetric 5 x 5 power. Skin: No rashes, lesions or ulcers Psychiatry: Judgement and insight appear normal. Mood & affect appropriate.     Data Reviewed: I have  personally reviewed following labs and imaging studies  CBC: Recent Labs  Lab 08/13/18 0352 08/13/18 1005 08/14/18 0638 08/15/18 0836 08/16/18 0556 08/17/18 0503  WBC 7.1  --  9.2 10.6* 16.2* 19.9*  HGB 9.9* 10.6* 10.3* 10.4* 10.9* 10.7*  HCT 30.9* 33.1* 31.8* 32.6* 33.9* 33.2*  MCV 77.1*  --  77.2* 77.8* 77.2* 79.4  PLT 292  --  360 367 489* 169*   Basic Metabolic Panel: Recent Labs  Lab 08/11/18 0337  08/13/18 0352 08/14/18 0638 08/15/18 0836 08/16/18 0556 08/17/18 0503  NA 139   < > 137 137 133* 133* 138  K 3.1*   < > 3.4* 3.5 3.2* 3.6 4.0  CL 102   < > 103 100 95* 97* 99  CO2 29   < > _0 GLUCOSE 117*   < > 130* 115* 113* 158* 141*  BUN <5*   < > 5* <5* _1 CREATININE 0.36*   < > 0.38* <0.30* 0.39* 0.52* 0.57*  CALCIUM 8.3*   < > 8.4* 8.6* 8.5* 8.9 9.2  MG 1.9  --   --   --   --  1.9  --    < > = values in this interval not displayed.   GFR: Estimated Creatinine Clearance: 123.7 mL/min (A) (by C-G formula based on SCr of 0.57 mg/dL (L)). Liver Function Tests: Recent Labs  Lab 08/13/18 0352 08/14/18 0638 08/15/18 0836 08/16/18 0556 08/17/18 0503  AST 148* 127* 91* 49* 27  ALT 125* 116* 98* 82* 60*  ALKPHOS 1,020* 1,081* 1,129* 1,062* 875*  BILITOT 11.8* 14.7* 16.0* 8.2* 6.0*  PROT 5.0* 5.2* 5.6* 6.1* 6.1*  ALBUMIN 2.2* 2.1* 2.2* 2.3* 2.3*   Recent Labs  Lab 08/11/18 0337 08/13/18 0352 08/15/18 0836  LIPASE 139* 113* 80*   No results for input(s): AMMONIA in the last 168 hours. Coagulation Profile: Recent Labs  Lab 08/16/18 1033  INR 1.25   Cardiac Enzymes: No results for input(s): CKTOTAL, CKMB, CKMBINDEX, TROPONINI in the last 168 hours. BNP (last 3 results) No results for input(s): PROBNP in the last 8760 hours. HbA1C: No results for input(s): HGBA1C in the last 72 hours. CBG: Recent Labs  Lab 08/16/18 1956 08/16/18 2349 08/17/18 0404 08/17/18 0742 08/17/18 1302  GLUCAP 167* 123* 134* 121* 84   Lipid  Profile: No results for input(s): CHOL, HDL, LDLCALC, TRIG, CHOLHDL, LDLDIRECT in the last 72 hours. Thyroid Function Tests: No results for input(s): TSH, T4TOTAL, FREET4, T3FREE, THYROIDAB in the last 72 hours. Anemia Panel: No results for input(s): VITAMINB12, FOLATE, FERRITIN, TIBC, IRON, RETICCTPCT in the last 72 hours. Sepsis Labs: No results for input(s): PROCALCITON, LATICACIDVEN in the last 168 hours.  No results found for this or any previous visit (from the past 240 hour(s)).       Radiology Studies: Ct Abdomen Pelvis W Contrast  Result Date: 08/17/2018 CLINICAL DATA:  Metastatic renal cell carcinoma. Jaundice and abdominal distention. Duodenitis. Pancreatitis. EXAM: CT ABDOMEN AND PELVIS WITH CONTRAST TECHNIQUE: Multidetector CT imaging of the abdomen and pelvis was performed using the standard protocol following bolus administration of intravenous contrast. CONTRAST:  154m OMNIPAQUE IOHEXOL 300 MG/ML  SOLN COMPARISON:  Multiple exams, including 08/10/2018 and MRI abdomen from 08/14/2018 FINDINGS: Lower chest: We partially image a centrally necrotic subcarinal lymph node measuring about 1.8 cm in short axis on image 1/2. Suspected left infrahilar node, 1 point 4 cm in short axis also on the top most image. Lower paraesophageal lymph node 2.1 cm in short axis on image 8/2, stable. Hepatobiliary: Stable intrahepatic biliary dilatation extending to the confluence of the right and left bile ducts. Attenuated proximal common hepatic duct and common bile duct, poorly seen, cannot exclude wall thickening. Contracted gallbladder with mild gallbladder wall thickening. Two gallstones are present, the larger 1.7 cm in diameter. Hypodense 1.2 cm right hepatic lobe lesion on image 18/2, nonspecific, possibly a metastatic lesion, not appreciably changed from the exam from 3 days ago. Pancreas: There is fluid along some of the margins the pancreas including in the lesser sac region which could be a  manifestation of pancreatitis. No dorsal pancreatic duct dilatation, pancreatic necrosis, pancreatic pseudocyst, or abscess. Spleen: Unremarkable Adrenals/Urinary Tract: Subtle thickening of the left adrenal gland merits observation. Exophytic 9.7 cm solid mass of the right kidney lower pole not appreciably changed from 08/14/2018, compatible with renal cell carcinoma. No appreciable tumor thrombus in the right renal vein. Stomach/Bowel: Distended stomach. Dilated loops of proximal small bowel extending to a transition in the vicinity of image 56/2 just to the right of midline where the bowel is no longer dilated but there is no obvious cause for obstruction. There is some stranding around the duodenum and possible mild proximal duodenitis, with wall thickening in the duodenal bulb as on image 34/2. Vascular/Lymphatic: Aortoiliac atherosclerotic vascular disease. No appreciable tumor thrombus in the right renal vein currently. Notable retrocrural and retroperitoneal adenopathy. A left periaortic node measures 2.1 cm in short axis on image 47/2. Reproductive: Unremarkable Other: Scattered moderate ascites. There is infiltration of the omentum primarily by fluid which  appears increased compared to 08/10/2018, for example in the right upper quadrant. I do not see definite tumor nodularity in the omentum or along the ascites, although peritoneal spread of tumor can be a cause for omental stranding and infiltration. Subcutaneous edema is present. Musculoskeletal: Large lytic metastatic lesions with associated compression fractures at L2 and L4, no change from 08/10/2018. Small lytic lesions in the left iliac bone are likewise stable from 08/07/2018. IMPRESSION: 1. Dilated loops of proximal small bowel without an obvious cause. There a transition in the mid small-bowel between dilated and nondilated loops. The appearance could be due to ileus perhaps related to pancreatitis. Moderately distended stomach. 2. Continued wall  thickening in the proximal duodenum compatible with duodenitis. 3. Increase in fluid along the lesser sac and mild increase in ascites. There is also increased infiltrative edema in the omentum which could be inflammatory or less likely due to peritoneal spread of tumor. 4. Stable degree of intrahepatic biliary dilatation. The extrahepatic biliary tree is markedly narrowed, possibly due to extrinsic narrowing related to pancreatitis, or possibly from cholangitis. 5. Gallstones are observed. The right kidney lower pole mass, metastatic adenopathy, and osseous metastatic disease all appear stable compared to recent CT scans. 6. There is no evidence of pancreatic abscess, necrosis, or pseudocyst. 7. Subcarinal and left infrahilar adenopathy appears stable. 8. 1.2 cm right hepatic lobe lesion. This was previously much larger and hypermetabolic on remote prior PET-CT but more recently the hypermetabolic activity had resolved and the lesion was notably smaller than on 01/03/2017. 9. There is some mild thickening of the left adrenal gland which merits surveillance. Electronically Signed   By: Van Clines M.D.   On: 08/17/2018 11:33        Scheduled Meds: . diclofenac sodium  2 g Topical QID  . docusate sodium  100 mg Oral BID  . feeding supplement  1 Container Oral TID BM  . fentaNYL  25 mcg Transdermal Q72H  . insulin aspart  0-9 Units Subcutaneous Q4H  . iopamidol      . linaclotide  145 mcg Oral QAC breakfast  . metoCLOPramide  10 mg Oral QID  . naloxegol oxalate  25 mg Oral Daily  . pantoprazole  40 mg Oral BID  . peg 3350 powder  0.5 kit Oral Once  . polyethylene glycol  34 g Oral TID  . predniSONE  60 mg Oral Q breakfast  . rivaroxaban  10 mg Oral Daily  . sucralfate  1 g Oral TID WC & HS   Continuous Infusions: . sodium chloride Stopped (08/10/18 1357)  . dextrose 5 % and 0.9% NaCl 10 mL/hr at 08/14/18 1957     LOS: 10 days     Georgette Shell, MD [facility]  If  7PM-7AM, please contact night-coverage [email or web address] Password [facility] 08/17/2018, 1:27 PM

## 2018-08-17 NOTE — Progress Notes (Signed)
Palliative:  I met again today with Mr. Ternes. He was tearful as I came in and has had a difficult day today. Per RN his pain and discomfort has increased since drinking contrast for CT scan. He has been miserable since then. He has just a minute ago received dilaudid and has pain 8/10 low right back and abd. We discussed plan to increase fentanyl patch as he is continuing to require frequent dilaudid pushes. Hopeful for plan to transition to po prn pain meds soon that he can utilize at home.   He also talks with me further about DNR that was established yesterday. He tells me today that he wants resuscitation but would only want to be on life support 24 hours to allow his family to arrive. Code status changed back to full code. Emotional support provided.   Plan:  DNR reversed back to full code.   Bowel Regimen: GI following. Aggressive regimen currently with Miralax TID, Linzess, colace. Consider decreasing Miralax to BID and half dose. Discussed need for bowel regimen as long as he is on pain medication. He continues to have some liquid stools today.   Pain right low back acute on chronic: Stabbing pain. Goal 8/10.  ? Fentanyl patch increased 25 to 50 mcg/hr afternoon  9/12.  ? Continue dilaudid 1 mg IV every 2 hours prn. Has used total 8 mg IV over past 24 hours.  ? Agree with steroids added/managed by Dr. Marin Olp (also wait for increase fentanyl to see if steroids will help overall pain control).  ? Could consider gabapentin.       20 min  Vinie Sill, NP Palliative Medicine Team Pager # [phone] (M-F 8a-5p) Team Phone # [phone] (Nights/Weekends)

## 2018-08-18 ENCOUNTER — Inpatient Hospital Stay (HOSPITAL_COMMUNITY): Payer: BLUE CROSS/BLUE SHIELD

## 2018-08-18 DIAGNOSIS — G893 Neoplasm related pain (acute) (chronic): Secondary | ICD-10-CM

## 2018-08-18 DIAGNOSIS — Z515 Encounter for palliative care: Secondary | ICD-10-CM

## 2018-08-18 LAB — CBC
HCT: 32.8 % — ABNORMAL LOW (ref 39.0–52.0)
Hemoglobin: 10.2 g/dL — ABNORMAL LOW (ref 13.0–17.0)
MCH: 25.4 pg — AB (ref 26.0–34.0)
MCHC: 31.1 g/dL (ref 30.0–36.0)
MCV: 81.6 fL (ref 78.0–100.0)
PLATELETS: 411 10*3/uL — AB (ref 150–400)
RBC: 4.02 MIL/uL — ABNORMAL LOW (ref 4.22–5.81)
RDW: 21.1 % — AB (ref 11.5–15.5)
WBC: 13.4 10*3/uL — ABNORMAL HIGH (ref 4.0–10.5)

## 2018-08-18 LAB — GLUCOSE, CAPILLARY
GLUCOSE-CAPILLARY: 120 mg/dL — AB (ref 70–99)
GLUCOSE-CAPILLARY: 128 mg/dL — AB (ref 70–99)
Glucose-Capillary: 133 mg/dL — ABNORMAL HIGH (ref 70–99)
Glucose-Capillary: 138 mg/dL — ABNORMAL HIGH (ref 70–99)
Glucose-Capillary: 138 mg/dL — ABNORMAL HIGH (ref 70–99)

## 2018-08-18 LAB — COMPREHENSIVE METABOLIC PANEL
ALT: 54 U/L — ABNORMAL HIGH (ref 0–44)
AST: 34 U/L (ref 15–41)
Albumin: 2.3 g/dL — ABNORMAL LOW (ref 3.5–5.0)
Alkaline Phosphatase: 778 U/L — ABNORMAL HIGH (ref 38–126)
Anion gap: 10 (ref 5–15)
BUN: 14 mg/dL (ref 6–20)
CALCIUM: 8.8 mg/dL — AB (ref 8.9–10.3)
CHLORIDE: 99 mmol/L (ref 98–111)
CO2: 28 mmol/L (ref 22–32)
CREATININE: 0.6 mg/dL — AB (ref 0.61–1.24)
GFR calc non Af Amer: >60 mL/min (ref >60)
Glucose, Bld: 142 mg/dL — ABNORMAL HIGH (ref 70–99)
POTASSIUM: 4 mmol/L (ref 3.5–5.1)
Sodium: 137 mmol/L (ref 135–145)
Total Bilirubin: 5.3 mg/dL — ABNORMAL HIGH (ref 0.3–1.2)
Total Protein: 5.9 g/dL — ABNORMAL LOW (ref 6.5–8.1)

## 2018-08-18 MED ORDER — POLYETHYLENE GLYCOL 3350 17 G PO PACK
34.0000 g | PACK | Freq: Every day | ORAL | Status: DC
  Administered 2018-08-18: 34 g via ORAL
  Administered 2018-08-20: 17 g via ORAL
  Filled 2018-08-18 (×4): qty 2

## 2018-08-18 MED ORDER — METOCLOPRAMIDE HCL 10 MG PO TABS: 10.0000 mg | ORAL_TABLET | Freq: Four times a day (QID) | ORAL | Status: DC | PRN

## 2018-08-18 NOTE — Progress Notes (Signed)
Daily Progress Note   Patient Name: Alexander Fry       Date: 08/18/2018 DOB: 13-May-1961  Age: 57 y.o. MRN#: [id number] Attending Physician: Georgette Shell, MD Primary Care Physician: Kris Mouton Admit Date: 08/06/2018  Reason for Consultation/Follow-up: Establishing goals of care and Pain control  Subjective: Chart reviewed including personal review of pertinent labs and imaging.    Note abdominal x ray this am with pattern indicative of persistent small bowel obstruction.    I saw and examined Mr. Frediani today.  We discussed his pain at length.  Concern is that increased pain may be related to possible SBO.  He does report that he has started having bowel movements this morning.  Reviewed his medication usage over the last 24 hours.      Length of Stay: 11  Current Medications: Scheduled Meds:  . diclofenac sodium  2 g Topical QID  . docusate sodium  100 mg Oral BID  . fentaNYL  50 mcg Transdermal Q72H  . insulin aspart  0-9 Units Subcutaneous Q4H  . linaclotide  145 mcg Oral QAC breakfast  . naloxegol oxalate  25 mg Oral Daily  . pantoprazole  40 mg Oral BID  . peg 3350 powder  0.5 kit Oral Once  . polyethylene glycol  34 g Oral Daily  . predniSONE  60 mg Oral Q breakfast  . rivaroxaban  10 mg Oral Daily  . sucralfate  1 g Oral TID WC & HS    Continuous Infusions: . sodium chloride Stopped (08/10/18 1357)  . dextrose 5 % and 0.9% NaCl 10 mL/hr at 08/14/18 1957    PRN Meds: sodium chloride, acetaminophen **OR** acetaminophen, alum & mag hydroxide-simeth, HYDROmorphone (DILAUDID) injection, iopamidol, metoCLOPramide, ondansetron (ZOFRAN) IV, simethicone, sodium chloride flush  Physical Exam      General: Alert, awake, in no acute distress. Standing at  the bedside. HEENT: No bruits, no goiter, no JVD Heart: Regular rate and rhythm. No murmur appreciated. Lungs: Good air movement, clear Abdomen: Soft, nontender, + distended, positive bowel sounds.  Ext: Some edema Skin: Warm and dry Neuro: Grossly intact, nonfocal.      Vital Signs: BP 136/87 (BP Location: Right Arm)   Pulse 78   Temp 98 F (36.7 C) (Oral)   Resp 16   Ht 6' (1.829  m)   Wt 99.4 kg   SpO2 99%   BMI 29.72 kg/m  SpO2: SpO2: 99 % O2 Device: O2 Device: Room Air O2 Flow Rate:    Intake/output summary:   Intake/Output Summary (Last 24 hours) at 08/18/2018 1335 Last data filed at 08/18/2018 0600 Gross per 24 hour  Intake 240 ml  Output 1300 ml  Net -1060 ml   LBM: Last BM Date: 08/18/18 Baseline Weight: Weight: 97.5 kg Most recent weight: Weight: 99.4 kg       Palliative Assessment/Data:    Flowsheet Rows     Most Recent Value  Intake Tab  Referral Department  Hospitalist  Unit at Time of Referral  Other (Comment) [urology/telementry]  Palliative Care Primary Diagnosis  Cancer  Date Notified  08/15/18  Palliative Care Type  New Palliative care  Reason for referral  Clarify Goals of Care, Non-pain Symptom  Date of Admission  08/06/18  Date first seen by Palliative Care  08/15/18  # of days Palliative referral response time  0 Day(s)  # of days IP prior to Palliative referral  9  Clinical Assessment  Psychosocial & Spiritual Assessment  Palliative Care Outcomes      Patient Active Problem List   Diagnosis Date Noted  . Full code status   . Uncontrolled pain   . Metastatic renal cell carcinoma ([place])   . Pancreatitis 08/07/2018  . Abdominal pain 08/07/2018  . Nausea & vomiting 08/07/2018  . Hypokalemia 08/07/2018  . Diabetes mellitus type 2 in obese ([place]) 08/07/2018  . Internal jugular (IJ) vein thromboembolism, chronic, right ([place]) 08/07/2018  . Normocytic anemia 08/07/2018  . Duodenitis 08/07/2018  . Malnutrition of moderate degree  08/04/2018  . Elevated LFTs 08/01/2018  . Iron deficiency anemia due to chronic blood loss 05/23/2018  . Hematuria, gross 05/03/2018  . Sleep disturbance 04/06/2018  . Knee arthropathy 04/03/2018  . Bilateral primary osteoarthritis of knee 04/03/2018  . Drug-induced jaundice 04/21/2017  . Rash and nonspecific skin eruption 04/21/2017  . Transaminitis 02/20/2017  . Onychomycosis of left great toe 02/17/2017  . Corn of foot 02/17/2017  . Brain metastases ([place]) 01/30/2017  . Bone metastases ([place]) 01/30/2017  . Internal hemorrhoids 01/07/2017  . Kidney cancer, primary, with metastasis from kidney to other site, right ([place]) 01/05/2017  . Acute right-sided low back pain with right-sided sciatica 12/24/2016  . Goals of care, counseling/discussion 12/22/2016  . Benign prostatic hyperplasia with nocturia 12/10/2016  . Abnormal CT of the chest 12/10/2016  . Hilar adenopathy 12/10/2016  . Multiple pulmonary nodules determined by computed tomography of lung 12/10/2016    Palliative Care Assessment & Plan   Recommendations/Plan:  Pain: Has been requiring significant amount of IV pain medications.  His fentanyl patch was increase less than 24 hours ago.  Increase in pain may be related to possible SBO.  Reports having BMs now.  Would continue current therapies to see if pain improves as BM increase.    Goals of Care and Additional Recommendations:  Limitations on Scope of Treatment: Full Scope Treatment  Code Status:    Code Status Orders  (From admission, onward)         Start     Ordered   08/17/18 1602  Full code  Continuous     08/17/18 1601        Code Status History    Date Active Date Inactive Code Status Order ID Comments User Context   08/16/2018 1451 08/17/2018 1601 DNR [id number]  Jerline Pain,  Gifford Shave, NP Inpatient   08/07/2018 0228 08/16/2018 1451 Full Code [id number]  [name], MD ED   08/01/2018 1500 08/04/2018 1719 Full Code [id number]  [name], MD ED   04/21/2017 1741  04/23/2017 1218 Full Code [id number]  [name], MD Inpatient       Prognosis:   Unable to determine  Discharge Planning:  To Be Determined  Care plan was discussed with patient, RN  Thank you for allowing the Palliative Medicine Team to assist in the care of this patient.   Total Time 50 Prolonged Time Billed No      Greater than 50%  of this time was spent counseling and coordinating care related to the above assessment and plan.  Micheline Rough, MD  Please contact Palliative Medicine Team phone at [phone] for questions and concerns.

## 2018-08-18 NOTE — Progress Notes (Signed)
PROGRESS NOTE    Alexander Fry  JAS:[id number] DOB: [birth year] DOA: 08/06/2018 PCP: [name], PA-C  Brief Narrative:57 y.o.male,w hx of metastatic renal cell carcinoma (bone, brain, lung), h/o R IJ thrombus, Iron deficiency anemia, w recent admission for abdominal pain where he was found to have duodenitis. Pt apparently went home and continued to have abdominal pain which was worse yesterday associated with some n/v. Pt denies fever, chills, diarrhea, brbpr, black stool. Pt has been taking protonix. Denies NSAIDS.   Interim history Admitted for acute pancreatitis. Gastroenterology consulted and appreciated. Continues to have abdominal pain and is requiring both IV as well as oral pain medications quite frequently.  Assessment & Plan:   Principal Problem:   Pancreatitis Active Problems:   Transaminitis   Abdominal pain   Nausea & vomiting   Hypokalemia   Diabetes mellitus type 2 in obese (HCC)   Internal jugular (IJ) vein thromboembolism, chronic, right (HCC)   Normocytic anemia   Duodenitis   Uncontrolled pain   Metastatic renal cell carcinoma ([place])   Full code status   Acute pancreatitis-resolving patient able to tolerate p.o. intake.  Patient has had multiple bowel movements.  Is of MiraLAX.  Leukocytosis improved. CT ABDOMEN -Dilated loops of proximal small bowel without an obvious cause. There a transition in the mid small-bowel between dilated and nondilated loops. The appearance could be due to ileus perhaps related to pancreatitis. Moderately distended stomach. 2. Continued wall thickening in the proximal duodenum compatible with duodenitis. 3. Increase in fluid along the lesser sac and mild increase in ascites. There is also increased infiltrative edema in the omentum which could be inflammatory or less likely due to peritoneal spread of tumor. 4. Stable degree of intrahepatic biliary dilatation. The extrahepatic biliary tree is markedly  narrowed, possibly due to extrinsic narrowing related to pancreatitis, or possibly from cholangitis. 5. Gallstones are observed. The right kidney lower pole mass, metastatic adenopathy, and osseous metastatic disease all appear stable compared to recent CT scans. 6. There is no evidence of pancreatic abscess, necrosis, or pseudocyst. 7. Subcarinal and left infrahilar adenopathy appears stable. 8. 1.2 cm right hepatic lobe lesion. This was previously much larger and hypermetabolic on remote prior PET-CT but more recently the hypermetabolic activity had resolved and the lesion was notably smaller than on 01/03/2017. 9. There is some mild thickening of the left adrenal gland which merits surveillance. Duodenitis -Completed course of Cipro and Flagyl -Continue Protonix -CT abdomen pelvis as above  -Steroids changed to p.o. 08/17/2018 on prednisone 60 mg daily. Abnormal LFTs -Trending downward -Hepatitis panel unremarkable -Patient does not want cholecystectomy done at this time.  Hypokalemia-resolved  Diabetes mellitus, type II -Continue insulin sliding scale with CBG monitoring  Metastatic renal cell carcinoma -Possibly elevation in alk phos due to metastatic bone disease alkaline phosphatase decreasing. -Discussed with Dr. Marin Olp- if abdominal pain is referred from cancer, may need nephrectomy- recommended urology consult -discussed with patient, he feels he would like to wait on urology consult and possibly pursue this as an outpatient following a PET scan  Chronic normocytic anemia -Appears to be stable, currently 10.3  Right IJ thrombus-and number would like patient to be started on Xarelto 10 mg daily.  Patient does not want to be on Lovenox and does not want to pick himself at home.    Lower extremity edemacould be multifactorial -suspect from abdominal pathology and venous insuffiencey/albuminemia 2.3 DVT prophylaxis:XARELTO Code Status FULL Family  Communication NONE Disposition Plan Hope to discharge him  early next week provided his LFTs continues to get normal and his pain is controlled. Consultants:  Palliative, oncology  Procedures: MRCP Antimicrobials: NONE Subjective: Sitting up in chair reports that he did not eat anything yesterday had multiple bowel movements on multiple stool softeners and laxatives which he does not want me to stop nausea better today  Objective: Vitals:   08/17/18 0407 08/17/18 1355 08/17/18 2149 08/18/18 0413  BP: 125/77 139/78 120/81 136/87  Pulse: 80 84 75 78  Resp: _0 Temp: 98.2 F (36.8 C) 97.8 F (36.6 C) 98.4 F (36.9 C) 98 F (36.7 C)  TempSrc: Oral Oral Oral Oral  SpO2: 94% 97% 97% 99%  Weight: 100.8 kg   99.4 kg  Height:        Intake/Output Summary (Last 24 hours) at 08/18/2018 1639 Last data filed at 08/18/2018 1500 Gross per 24 hour  Intake 240 ml  Output 1600 ml  Net -1360 ml   Filed Weights   08/16/18 0443 08/17/18 0407 08/18/18 0413  Weight: 100.1 kg 100.8 kg 99.4 kg    Examination:  General exam: Appears calm and comfortable  Respiratory system: Clear to auscultation. Respiratory effort normal. Cardiovascular system: S1 & S2 heard, RRR. No JVD, murmurs, rubs, gallops or clicks. No pedal edema. Gastrointestinal system: Abdomen is DISTENDED , soft and nontender. No organomegaly or masses felt. Normal bowel sounds heard. Central nervous system: Alert and oriented. No focal neurological deficits. Extremities: Symmetric 5 x 5 power. Skin: No rashes, lesions or ulcers Psychiatry: Judgement and insight appear normal. Mood & affect appropriate.     Data Reviewed: I have personally reviewed following labs and imaging studies  CBC: Recent Labs  Lab 08/14/18 0638 08/15/18 0836 08/16/18 0556 08/17/18 0503 08/18/18 0431  WBC 9.2 10.6* 16.2* 19.9* 13.4*  HGB 10.3* 10.4* 10.9* 10.7* 10.2*  HCT 31.8* 32.6* 33.9* 33.2* 32.8*  MCV 77.2* 77.8* 77.2* 79.4 81.6    PLT 360 367 489* 447* 941*   Basic Metabolic Panel: Recent Labs  Lab 08/14/18 0638 08/15/18 0836 08/16/18 0556 08/17/18 0503 08/18/18 0431  NA 137 133* 133* 138 137  K 3.5 3.2* 3.6 4.0 4.0  CL 100 95* 97* 99 99  CO2 _1 GLUCOSE 115* 113* 158* 141* 142*  BUN <5* _2 CREATININE <0.30* 0.39* 0.52* 0.57* 0.60*  CALCIUM 8.6* 8.5* 8.9 9.2 8.8*  MG  --   --  1.9  --   --    GFR: Estimated Creatinine Clearance: 122.9 mL/min (A) (by C-G formula based on SCr of 0.6 mg/dL (L)). Liver Function Tests: Recent Labs  Lab 08/14/18 7408 08/15/18 0836 08/16/18 0556 08/17/18 0503 08/18/18 0431  AST 127* 91* 49* 27 34  ALT 116* 98* 82* 60* 54*  ALKPHOS 1,081* 1,129* 1,062* 875* 778*  BILITOT 14.7* 16.0* 8.2* 6.0* 5.3*  PROT 5.2* 5.6* 6.1* 6.1* 5.9*  ALBUMIN 2.1* 2.2* 2.3* 2.3* 2.3*   Recent Labs  Lab 08/13/18 0352 08/15/18 0836  LIPASE 113* 80*   No results for input(s): AMMONIA in the last 168 hours. Coagulation Profile: Recent Labs  Lab 08/16/18 1033  INR 1.25   Cardiac Enzymes: No results for input(s): CKTOTAL, CKMB, CKMBINDEX, TROPONINI in the last 168 hours. BNP (last 3 results) No results for input(s): PROBNP in the last 8760 hours. HbA1C: No results for input(s): HGBA1C in the last 72 hours. CBG: Recent Labs  Lab 08/17/18 2007 08/17/18 2345 08/18/18 0411 08/18/18  0800 08/18/18 1208  GLUCAP 119* 120* 133* 128* 138*   Lipid Profile: No results for input(s): CHOL, HDL, LDLCALC, TRIG, CHOLHDL, LDLDIRECT in the last 72 hours. Thyroid Function Tests: No results for input(s): TSH, T4TOTAL, FREET4, T3FREE, THYROIDAB in the last 72 hours. Anemia Panel: No results for input(s): VITAMINB12, FOLATE, FERRITIN, TIBC, IRON, RETICCTPCT in the last 72 hours. Sepsis Labs: No results for input(s): PROCALCITON, LATICACIDVEN in the last 168 hours.  No results found for this or any previous visit (from the past 240 hour(s)).       Radiology  Studies: Ct Abdomen Pelvis W Contrast  Result Date: 08/17/2018 CLINICAL DATA:  Metastatic renal cell carcinoma. Jaundice and abdominal distention. Duodenitis. Pancreatitis. EXAM: CT ABDOMEN AND PELVIS WITH CONTRAST TECHNIQUE: Multidetector CT imaging of the abdomen and pelvis was performed using the standard protocol following bolus administration of intravenous contrast. CONTRAST:  162m OMNIPAQUE IOHEXOL 300 MG/ML  SOLN COMPARISON:  Multiple exams, including 08/10/2018 and MRI abdomen from 08/14/2018 FINDINGS: Lower chest: We partially image a centrally necrotic subcarinal lymph node measuring about 1.8 cm in short axis on image 1/2. Suspected left infrahilar node, 1 point 4 cm in short axis also on the top most image. Lower paraesophageal lymph node 2.1 cm in short axis on image 8/2, stable. Hepatobiliary: Stable intrahepatic biliary dilatation extending to the confluence of the right and left bile ducts. Attenuated proximal common hepatic duct and common bile duct, poorly seen, cannot exclude wall thickening. Contracted gallbladder with mild gallbladder wall thickening. Two gallstones are present, the larger 1.7 cm in diameter. Hypodense 1.2 cm right hepatic lobe lesion on image 18/2, nonspecific, possibly a metastatic lesion, not appreciably changed from the exam from 3 days ago. Pancreas: There is fluid along some of the margins the pancreas including in the lesser sac region which could be a manifestation of pancreatitis. No dorsal pancreatic duct dilatation, pancreatic necrosis, pancreatic pseudocyst, or abscess. Spleen: Unremarkable Adrenals/Urinary Tract: Subtle thickening of the left adrenal gland merits observation. Exophytic 9.7 cm solid mass of the right kidney lower pole not appreciably changed from 08/14/2018, compatible with renal cell carcinoma. No appreciable tumor thrombus in the right renal vein. Stomach/Bowel: Distended stomach. Dilated loops of proximal small bowel extending to a transition  in the vicinity of image 56/2 just to the right of midline where the bowel is no longer dilated but there is no obvious cause for obstruction. There is some stranding around the duodenum and possible mild proximal duodenitis, with wall thickening in the duodenal bulb as on image 34/2. Vascular/Lymphatic: Aortoiliac atherosclerotic vascular disease. No appreciable tumor thrombus in the right renal vein currently. Notable retrocrural and retroperitoneal adenopathy. A left periaortic node measures 2.1 cm in short axis on image 47/2. Reproductive: Unremarkable Other: Scattered moderate ascites. There is infiltration of the omentum primarily by fluid which appears increased compared to 08/10/2018, for example in the right upper quadrant. I do not see definite tumor nodularity in the omentum or along the ascites, although peritoneal spread of tumor can be a cause for omental stranding and infiltration. Subcutaneous edema is present. Musculoskeletal: Large lytic metastatic lesions with associated compression fractures at L2 and L4, no change from 08/10/2018. Small lytic lesions in the left iliac bone are likewise stable from 08/07/2018. IMPRESSION: 1. Dilated loops of proximal small bowel without an obvious cause. There a transition in the mid small-bowel between dilated and nondilated loops. The appearance could be due to ileus perhaps related to pancreatitis. Moderately distended stomach. 2. Continued  wall thickening in the proximal duodenum compatible with duodenitis. 3. Increase in fluid along the lesser sac and mild increase in ascites. There is also increased infiltrative edema in the omentum which could be inflammatory or less likely due to peritoneal spread of tumor. 4. Stable degree of intrahepatic biliary dilatation. The extrahepatic biliary tree is markedly narrowed, possibly due to extrinsic narrowing related to pancreatitis, or possibly from cholangitis. 5. Gallstones are observed. The right kidney lower pole  mass, metastatic adenopathy, and osseous metastatic disease all appear stable compared to recent CT scans. 6. There is no evidence of pancreatic abscess, necrosis, or pseudocyst. 7. Subcarinal and left infrahilar adenopathy appears stable. 8. 1.2 cm right hepatic lobe lesion. This was previously much larger and hypermetabolic on remote prior PET-CT but more recently the hypermetabolic activity had resolved and the lesion was notably smaller than on 01/03/2017. 9. There is some mild thickening of the left adrenal gland which merits surveillance. Electronically Signed   By: Van Clines M.D.   On: 08/17/2018 11:33   Dg Abd 2 Views  Result Date: 08/18/2018 CLINICAL DATA:  Small bowel obstruction EXAM: ABDOMEN - 2 VIEW COMPARISON:  CT abdomen and pelvis August 17, 2018 FINDINGS: Supine and upright images were obtained. There remain loops of dilated small bowel with multiple air-fluid levels. There is only a small amount of air in the colon. There is no demonstrable free air. No abnormal calcifications. Lung bases clear. IMPRESSION: Small bowel dilatation with multiple air-fluid levels, a pattern indicative of persistent small bowel obstruction. No free air. Lung bases clear. Electronically Signed   By: Lowella Grip III M.D.   On: 08/18/2018 09:43        Scheduled Meds: . diclofenac sodium  2 g Topical QID  . docusate sodium  100 mg Oral BID  . fentaNYL  50 mcg Transdermal Q72H  . insulin aspart  0-9 Units Subcutaneous Q4H  . linaclotide  145 mcg Oral QAC breakfast  . naloxegol oxalate  25 mg Oral Daily  . pantoprazole  40 mg Oral BID  . peg 3350 powder  0.5 kit Oral Once  . polyethylene glycol  34 g Oral Daily  . predniSONE  60 mg Oral Q breakfast  . rivaroxaban  10 mg Oral Daily  . sucralfate  1 g Oral TID WC & HS   Continuous Infusions: . sodium chloride Stopped (08/10/18 1357)  . dextrose 5 % and 0.9% NaCl 10 mL/hr at 08/14/18 1957     LOS: 11 days    Georgette Shell, MD [facility]  If 7PM-7AM, please contact night-coverage [email or web address] Password [facility] 08/18/2018, 4:39 PM

## 2018-08-18 NOTE — Progress Notes (Signed)
Alexander Fry is feeling a little bit better today.  He had a rough day yesterday.  His CT of the abdomen seem to show an area of bowel obstruction.  I am not sure exactly how this would happen.  His liver looked okay.  There is still some biliary dilatation.  He had some thickening of the duodenum but not as much.  He is having bowel movements.  This is made him feel better.  His bilirubin is come down nicely.  Is now 5.3.  I am not sure this is from the steroids that we put him on what this was just the passage of a stone.  His alkaline phosphatase is now down to 778.  His creatinine is great at 0.6.  His calcium is 8.8 with an albumin of 2.3.  His white cell count is 13.4 hemoglobin 10.2.  He does appear to be somewhat distended this morning.  I do not think this is fluid although the CT scan did show some ascites.  We will see about a plain film (KUB) to see if there is any obstruction.  He has had no fever.  He really did not eat much yesterday.  He is out of bed.  His vital signs all look pretty stable.  On his exam, his lungs sound good.  Cardiac exam is regular rate and rhythm without any murmurs.  Abdomen is slightly distended.  Bowel sounds are present.  He has no obvious fluid wave.  There is no abdominal mass.  There is no palpable liver or spleen tip.  Extremities shows about 1+ edema.  We will see what the abdominal film shows.  Hopefully, there is no obstruction.  He is still on a liquid diet I think.  He now is on Xarelto at 10 mg a day.  I appreciate everybody's help with Mr. Spurgeon.  I know that this is a complicated situation.  Lattie Haw, MD  Colossians 3:23

## 2018-08-19 LAB — COMPREHENSIVE METABOLIC PANEL
ALBUMIN: 2.6 g/dL — AB (ref 3.5–5.0)
ALT: 67 U/L — ABNORMAL HIGH (ref 0–44)
AST: 56 U/L — AB (ref 15–41)
Alkaline Phosphatase: 870 U/L — ABNORMAL HIGH (ref 38–126)
Anion gap: 10 (ref 5–15)
BUN: 14 mg/dL (ref 6–20)
CALCIUM: 9.1 mg/dL (ref 8.9–10.3)
CO2: 29 mmol/L (ref 22–32)
Chloride: 100 mmol/L (ref 98–111)
Creatinine, Ser: 0.59 mg/dL — ABNORMAL LOW (ref 0.61–1.24)
GFR calc Af Amer: >60 mL/min (ref >60)
Glucose, Bld: 117 mg/dL — ABNORMAL HIGH (ref 70–99)
Potassium: 3.5 mmol/L (ref 3.5–5.1)
Sodium: 139 mmol/L (ref 135–145)
Total Bilirubin: 5.4 mg/dL — ABNORMAL HIGH (ref 0.3–1.2)
Total Protein: 5.9 g/dL — ABNORMAL LOW (ref 6.5–8.1)

## 2018-08-19 LAB — CBC
HCT: 34.1 % — ABNORMAL LOW (ref 39.0–52.0)
Hemoglobin: 10.7 g/dL — ABNORMAL LOW (ref 13.0–17.0)
MCH: 25.4 pg — ABNORMAL LOW (ref 26.0–34.0)
MCHC: 31.4 g/dL (ref 30.0–36.0)
MCV: 81 fL (ref 78.0–100.0)
PLATELETS: 425 10*3/uL — AB (ref 150–400)
RBC: 4.21 MIL/uL — ABNORMAL LOW (ref 4.22–5.81)
RDW: 20.6 % — ABNORMAL HIGH (ref 11.5–15.5)
WBC: 14.1 10*3/uL — ABNORMAL HIGH (ref 4.0–10.5)

## 2018-08-19 LAB — GLUCOSE, CAPILLARY
GLUCOSE-CAPILLARY: 123 mg/dL — AB (ref 70–99)
GLUCOSE-CAPILLARY: 115 mg/dL — AB (ref 70–99)
GLUCOSE-CAPILLARY: 85 mg/dL (ref 70–99)
Glucose-Capillary: 97 mg/dL (ref 70–99)
Glucose-Capillary: 84 mg/dL (ref 70–99)
Glucose-Capillary: 152 mg/dL — ABNORMAL HIGH (ref 70–99)
Glucose-Capillary: 132 mg/dL — ABNORMAL HIGH (ref 70–99)

## 2018-08-19 MED ORDER — FENTANYL 100 MCG/HR TD PT72
100.0000 ug | MEDICATED_PATCH | TRANSDERMAL | Status: DC
  Administered 2018-08-19 – 2018-08-28 (×4): 100 ug via TRANSDERMAL
  Filled 2018-08-19 (×4): qty 1

## 2018-08-19 MED ORDER — ONDANSETRON HCL 4 MG/2ML IJ SOLN
4.0000 mg | Freq: Three times a day (TID) | INTRAMUSCULAR | Status: DC
  Administered 2018-08-19 – 2018-08-24 (×14): 4 mg via INTRAVENOUS
  Filled 2018-08-19 (×14): qty 2

## 2018-08-19 NOTE — Progress Notes (Signed)
PROGRESS NOTE    Alexander Fry  GGY:[id number] DOB: 11/25/1961 DOA: 08/06/2018 PCP: [name], PA-C Brief Narrative::57 y.o.male,w hx of metastatic renal cell carcinoma (bone, brain, lung), h/o R IJ thrombus, Iron deficiency anemia, w recent admission for abdominal pain where he was found to have duodenitis. Pt apparently went home and continued to have abdominal pain which was worse yesterday associated with some n/v. Pt denies fever, chills, diarrhea, brbpr, black stool. Pt has been taking protonix. Denies NSAIDS.   Interim history Admitted for acute pancreatitis. Gastroenterology consulted and appreciated. Continues to have abdominal pain and is requiring both IV as well as oral pain medications quite frequently.   Assessment & Plan:   Principal Problem:   Pancreatitis Active Problems:   Transaminitis   Abdominal pain   Nausea & vomiting   Hypokalemia   Diabetes mellitus type 2 in obese (HCC)   Internal jugular (IJ) vein thromboembolism, chronic, right (HCC)   Normocytic anemia   Duodenitis   Uncontrolled pain   Metastatic renal cell carcinoma ([place])   Full code status #1 acute pancreatitis resolved.  CT scan of the abdomen repeated shows findings questionable findings of bowel obstruction but patient has been passing gas and moving bowels and is using narcotics for pain.  At this time there is no evidence of acute bowel obstruction.  CT scan also revealed some evidence of duodenitis which was treated with Cipro and Flagyl.  IV steroids changed to prednisone on September 12.  Abnormal LFTs have been trending down and it looks like it has plateaued yesterday and today.  #2 metastatic renal cell carcinoma -follow-up PET scan as an outpatient patient has no interest to see a urologist at this time.  He will consider seeing a urologist as an outpatient.  He does not want to have any kind of surgery at this time.  #3 right IJ thrombus on Xarelto continue.  Per Dr.  Marin Olp.  #4 lower extremity edema multifactorial advised him to keep his feet elevated when he is in bed.  Ambulate as much as possible.  Continue TED hose.  #5 hypokalemia resolved.     DVT prophylaxis Xarelto Code Status: Full code Family Communication none Disposition Plan: I have discussed with the patient that we will discharge him early next week.  And patient to palliative care input.  Duragesic patch dose has been increased to 100 MCG today.  Hopefully he will decrease his use of IV Dilaudid.   Consultants:  Oncology and palliative care  Procedures: MRCP Antimicrobials: None Subjective: Sitting up in chair with his legs down specific complaints had a bowel movement yesterday some nausea   Objective: Vitals:   08/18/18 0413 08/18/18 1500 08/18/18 2009 08/19/18 0330  BP: 136/87 128/79 (!) 143/82 135/87  Pulse: 78 77 78 85  Resp: '16 18 18 18  ' Temp: 98 F (36.7 C) 98.1 F (36.7 C) 98.9 F (37.2 C) 98.6 F (37 C)  TempSrc: Oral Oral Oral Oral  SpO2: 99% 99% 100% 92%  Weight: 99.4 kg   98.7 kg  Height:        Intake/Output Summary (Last 24 hours) at 08/19/2018 1241 Last data filed at 08/19/2018 0604 Gross per 24 hour  Intake 250 ml  Output 500 ml  Net -250 ml   Filed Weights   08/17/18 0407 08/18/18 0413 08/19/18 0330  Weight: 100.8 kg 99.4 kg 98.7 kg    Examination:  General exam: Appears calm and comfortable  Respiratory system: Clear to auscultation. Respiratory  effort normal. Cardiovascular system: S1 & S2 heard, RRR. No JVD, murmurs, rubs, gallops or clicks. No pedal edema. Gastrointestinal system: Abdomen is nondistended, soft and nontender. No organomegaly or masses felt. Normal bowel sounds heard. Central nervous system: Alert and oriented. No focal neurological deficits. Extremities: Symmetric 5 x 5 power. Skin: No rashes, lesions or ulcers Psychiatry: Judgement and insight appear normal. Mood & affect appropriate.     Data Reviewed: I have  personally reviewed following labs and imaging studies  CBC: Recent Labs  Lab 08/15/18 0836 08/16/18 0556 08/17/18 0503 08/18/18 0431 08/19/18 0329  WBC 10.6* 16.2* 19.9* 13.4* 14.1*  HGB 10.4* 10.9* 10.7* 10.2* 10.7*  HCT 32.6* 33.9* 33.2* 32.8* 34.1*  MCV 77.8* 77.2* 79.4 81.6 81.0  PLT 367 489* 447* 411* 203*   Basic Metabolic Panel: Recent Labs  Lab 08/15/18 0836 08/16/18 0556 08/17/18 0503 08/18/18 0431 08/19/18 0329  NA 133* 133* 138 137 139  K 3.2* 3.6 4.0 4.0 3.5  CL 95* 97* 99 99 100  CO2 '27 24 26 28 29  ' GLUCOSE 113* 158* 141* 142* 117*  BUN '7 10 13 14 14  ' CREATININE 0.39* 0.52* 0.57* 0.60* 0.59*  CALCIUM 8.5* 8.9 9.2 8.8* 9.1  MG  --  1.9  --   --   --    GFR: Estimated Creatinine Clearance: 122.4 mL/min (A) (by C-G formula based on SCr of 0.59 mg/dL (L)). Liver Function Tests: Recent Labs  Lab 08/15/18 0836 08/16/18 0556 08/17/18 0503 08/18/18 0431 08/19/18 0329  AST 91* 49* 27 34 56*  ALT 98* 82* 60* 54* 67*  ALKPHOS 1,129* 1,062* 875* 778* 870*  BILITOT 16.0* 8.2* 6.0* 5.3* 5.4*  PROT 5.6* 6.1* 6.1* 5.9* 5.9*  ALBUMIN 2.2* 2.3* 2.3* 2.3* 2.6*   Recent Labs  Lab 08/13/18 0352 08/15/18 0836  LIPASE 113* 80*   No results for input(s): AMMONIA in the last 168 hours. Coagulation Profile: Recent Labs  Lab 08/16/18 1033  INR 1.25   Cardiac Enzymes: No results for input(s): CKTOTAL, CKMB, CKMBINDEX, TROPONINI in the last 168 hours. BNP (last 3 results) No results for input(s): PROBNP in the last 8760 hours. HbA1C: No results for input(s): HGBA1C in the last 72 hours. CBG: Recent Labs  Lab 08/18/18 2007 08/19/18 0049 08/19/18 0328 08/19/18 0753 08/19/18 1143  GLUCAP 123* 97 115* 84 85   Lipid Profile: No results for input(s): CHOL, HDL, LDLCALC, TRIG, CHOLHDL, LDLDIRECT in the last 72 hours. Thyroid Function Tests: No results for input(s): TSH, T4TOTAL, FREET4, T3FREE, THYROIDAB in the last 72 hours. Anemia Panel: No results for  input(s): VITAMINB12, FOLATE, FERRITIN, TIBC, IRON, RETICCTPCT in the last 72 hours. Sepsis Labs: No results for input(s): PROCALCITON, LATICACIDVEN in the last 168 hours.  No results found for this or any previous visit (from the past 240 hour(s)).       Radiology Studies: Dg Abd 2 Views  Result Date: 08/18/2018 CLINICAL DATA:  Small bowel obstruction EXAM: ABDOMEN - 2 VIEW COMPARISON:  CT abdomen and pelvis August 17, 2018 FINDINGS: Supine and upright images were obtained. There remain loops of dilated small bowel with multiple air-fluid levels. There is only a small amount of air in the colon. There is no demonstrable free air. No abnormal calcifications. Lung bases clear. IMPRESSION: Small bowel dilatation with multiple air-fluid levels, a pattern indicative of persistent small bowel obstruction. No free air. Lung bases clear. Electronically Signed   By: Lowella Grip III M.D.   On: 08/18/2018 09:43  Scheduled Meds: . diclofenac sodium  2 g Topical QID  . docusate sodium  100 mg Oral BID  . fentaNYL  50 mcg Transdermal Q72H  . insulin aspart  0-9 Units Subcutaneous Q4H  . linaclotide  145 mcg Oral QAC breakfast  . naloxegol oxalate  25 mg Oral Daily  . pantoprazole  40 mg Oral BID  . peg 3350 powder  0.5 kit Oral Once  . polyethylene glycol  34 g Oral Daily  . predniSONE  60 mg Oral Q breakfast  . rivaroxaban  10 mg Oral Daily  . sucralfate  1 g Oral TID WC & HS   Continuous Infusions: . sodium chloride Stopped (08/10/18 1357)  . dextrose 5 % and 0.9% NaCl 10 mL/hr at 08/14/18 1957     LOS: 12 days     Georgette Shell, MD [facility]  If 7PM-7AM, please contact night-coverage [email or web address] Password [facility] 08/19/2018, 12:41 PM

## 2018-08-19 NOTE — Progress Notes (Signed)
Patient had a medium/large soft, snake-like, brown BM. Pt states he feels "relieved" and is hoping this "larger BM" will help relieve some of his back pain.

## 2018-08-19 NOTE — Progress Notes (Signed)
Daily Progress Note   Patient Name: Alexander Fry       Date: 08/19/2018 DOB: Feb 19, 1961  Age: 57 y.o. MRN#: [id number] Attending Physician: Georgette Shell, MD Primary Care Physician: Kris Mouton Admit Date: 08/06/2018  Reason for Consultation/Follow-up: Establishing goals of care and Pain control  Subjective: Chart reviewed including personal review of pertinent labs and imaging.    I saw and examined Alexander Fry today.  Again discussed his pain at length.  He does report that he has started having bowel movements this morning.  Reviewed his medication usage over the last 24 hours.    Discussed case and plan with Dr. Zigmund Daniel.    See below.  Length of Stay: 12  Current Medications: Scheduled Meds:  . diclofenac sodium  2 g Topical QID  . docusate sodium  100 mg Oral BID  . fentaNYL  50 mcg Transdermal Q72H  . insulin aspart  0-9 Units Subcutaneous Q4H  . linaclotide  145 mcg Oral QAC breakfast  . naloxegol oxalate  25 mg Oral Daily  . pantoprazole  40 mg Oral BID  . peg 3350 powder  0.5 kit Oral Once  . polyethylene glycol  34 g Oral Daily  . predniSONE  60 mg Oral Q breakfast  . rivaroxaban  10 mg Oral Daily  . sucralfate  1 g Oral TID WC & HS    Continuous Infusions: . sodium chloride Stopped (08/10/18 1357)  . dextrose 5 % and 0.9% NaCl 10 mL/hr at 08/14/18 1957    PRN Meds: sodium chloride, acetaminophen **OR** acetaminophen, alum & mag hydroxide-simeth, HYDROmorphone (DILAUDID) injection, iopamidol, metoCLOPramide, ondansetron (ZOFRAN) IV, simethicone, sodium chloride flush  Physical Exam      General: Alert, awake, in no acute distress. Lying in bed. HEENT: No bruits, no goiter, no JVD Heart: Regular rate and rhythm. No murmur  appreciated. Lungs: Good air movement, clear Abdomen: Soft, nontender, + distended, positive bowel sounds.  Ext: Some edema Skin: Warm and dry Neuro: Grossly intact, nonfocal.      Vital Signs: BP 135/87 (BP Location: Left Arm)   Pulse 85   Temp 98.6 F (37 C) (Oral)   Resp 18   Ht 6' (1.829 m)   Wt 98.7 kg   SpO2 92%   BMI 29.50 kg/m  SpO2: SpO2: 92 %  O2 Device: O2 Device: Room Air O2 Flow Rate:    Intake/output summary:   Intake/Output Summary (Last 24 hours) at 08/19/2018 1207 Last data filed at 08/19/2018 0604 Gross per 24 hour  Intake 250 ml  Output 500 ml  Net -250 ml   LBM: Last BM Date: 08/18/18 Baseline Weight: Weight: 97.5 kg Most recent weight: Weight: 98.7 kg       Palliative Assessment/Data:    Flowsheet Rows     Most Recent Value  Intake Tab  Referral Department  Hospitalist  Unit at Time of Referral  Other (Comment) [urology/telementry]  Palliative Care Primary Diagnosis  Cancer  Date Notified  08/15/18  Palliative Care Type  New Palliative care  Reason for referral  Clarify Goals of Care, Non-pain Symptom  Date of Admission  08/06/18  Date first seen by Palliative Care  08/15/18  # of days Palliative referral response time  0 Day(s)  # of days IP prior to Palliative referral  9  Clinical Assessment  Psychosocial & Spiritual Assessment  Palliative Care Outcomes      Patient Active Problem List   Diagnosis Date Noted  . Full code status   . Uncontrolled pain   . Metastatic renal cell carcinoma ([place])   . Pancreatitis 08/07/2018  . Abdominal pain 08/07/2018  . Nausea & vomiting 08/07/2018  . Hypokalemia 08/07/2018  . Diabetes mellitus type 2 in obese ([name]) 08/07/2018  . Internal jugular (IJ) vein thromboembolism, chronic, right ([name]) 08/07/2018  . Normocytic anemia 08/07/2018  . Duodenitis 08/07/2018  . Malnutrition of moderate degree 08/04/2018  . Elevated LFTs 08/01/2018  . Iron deficiency anemia due to chronic blood loss  05/23/2018  . Hematuria, gross 05/03/2018  . Sleep disturbance 04/06/2018  . Knee arthropathy 04/03/2018  . Bilateral primary osteoarthritis of knee 04/03/2018  . Drug-induced jaundice 04/21/2017  . Rash and nonspecific skin eruption 04/21/2017  . Transaminitis 02/20/2017  . Onychomycosis of left great toe 02/17/2017  . Corn of foot 02/17/2017  . Brain metastases ([name]) 01/30/2017  . Bone metastases ([name]) 01/30/2017  . Internal hemorrhoids 01/07/2017  . Kidney cancer, primary, with metastasis from kidney to other site, right ([name]) 01/05/2017  . Acute right-sided low back pain with right-sided sciatica 12/24/2016  . Goals of care, counseling/discussion 12/22/2016  . Benign prostatic hyperplasia with nocturia 12/10/2016  . Abnormal CT of the chest 12/10/2016  . Hilar adenopathy 12/10/2016  . Multiple pulmonary nodules determined by computed tomography of lung 12/10/2016    Palliative Care Assessment & Plan   Recommendations/Plan:  Pain: Continues to require significant amount of IV pain medications.  His fentanyl patch is currently 22mg.  He has used 837mIV dialudid (16075mral morphine equivalent) in the last 24 hours.  Continues to report pain not well controlled.  Will plan to increase fentanyl patch to 100m41maily today.  Hopeful to begin working to transition off of IV rescue medications once higher dose fentanyl patch reaches steady state (late tomorrow).    Goals of Care and Additional Recommendations:  Limitations on Scope of Treatment: Full Scope Treatment  Code Status:    Code Status Orders  (From admission, onward)         Start     Ordered   08/17/18 1602  Full code  Continuous     08/17/18 1601        Code Status History    Date Active Date Inactive Code Status Order ID Comments User Context   08/16/2018 1451 08/17/2018  [name] DNR [id number]  Pershing Proud, NP Inpatient   08/07/2018 0228 08/16/2018 1451 Full Code [id number]  [name], MD ED   08/01/2018 1500  08/04/2018 1719 Full Code [id number]  [name], MD ED   04/21/2017 1741 04/23/2017 1218 Full Code [id number]  [name], MD Inpatient       Prognosis:   Unable to determine  Discharge Planning:  To Be Determined  Care plan was discussed with patient, RN  Thank you for allowing the Palliative Medicine Team to assist in the care of this patient.   Total Time 30 Prolonged Time Billed No      Greater than 50%  of this time was spent counseling and coordinating care related to the above assessment and plan.  Micheline Rough, MD  Please contact Palliative Medicine Team phone at [phone] for questions and concerns.

## 2018-08-20 LAB — COMPREHENSIVE METABOLIC PANEL
ALBUMIN: 2.2 g/dL — AB (ref 3.5–5.0)
ALT: 58 U/L — AB (ref 0–44)
AST: 44 U/L — AB (ref 15–41)
Alkaline Phosphatase: 671 U/L — ABNORMAL HIGH (ref 38–126)
Anion gap: 8 (ref 5–15)
BUN: 15 mg/dL (ref 6–20)
CHLORIDE: 101 mmol/L (ref 98–111)
CO2: 29 mmol/L (ref 22–32)
CREATININE: 0.73 mg/dL (ref 0.61–1.24)
Calcium: 8.5 mg/dL — ABNORMAL LOW (ref 8.9–10.3)
GFR calc Af Amer: >60 mL/min (ref >60)
GLUCOSE: 107 mg/dL — AB (ref 70–99)
POTASSIUM: 3.4 mmol/L — AB (ref 3.5–5.1)
SODIUM: 138 mmol/L (ref 135–145)
Total Bilirubin: 4.5 mg/dL — ABNORMAL HIGH (ref 0.3–1.2)
Total Protein: 5.2 g/dL — ABNORMAL LOW (ref 6.5–8.1)

## 2018-08-20 LAB — GLUCOSE, CAPILLARY
GLUCOSE-CAPILLARY: 126 mg/dL — AB (ref 70–99)
Glucose-Capillary: 102 mg/dL — ABNORMAL HIGH (ref 70–99)
Glucose-Capillary: 113 mg/dL — ABNORMAL HIGH (ref 70–99)
Glucose-Capillary: 116 mg/dL — ABNORMAL HIGH (ref 70–99)
Glucose-Capillary: 124 mg/dL — ABNORMAL HIGH (ref 70–99)
Glucose-Capillary: 140 mg/dL — ABNORMAL HIGH (ref 70–99)

## 2018-08-20 LAB — CBC
HCT: 29.4 % — ABNORMAL LOW (ref 39.0–52.0)
Hemoglobin: 9.2 g/dL — ABNORMAL LOW (ref 13.0–17.0)
MCH: 25.6 pg — ABNORMAL LOW (ref 26.0–34.0)
MCHC: 31.3 g/dL (ref 30.0–36.0)
MCV: 81.7 fL (ref 78.0–100.0)
PLATELETS: 329 10*3/uL (ref 150–400)
RBC: 3.6 MIL/uL — AB (ref 4.22–5.81)
RDW: 20.3 % — AB (ref 11.5–15.5)
WBC: 9.9 10*3/uL (ref 4.0–10.5)

## 2018-08-20 MED ORDER — HYDROMORPHONE HCL 1 MG/ML IJ SOLN
1.0000 mg | INTRAMUSCULAR | Status: DC | PRN
  Administered 2018-08-21 – 2018-08-24 (×13): 1 mg via INTRAVENOUS
  Filled 2018-08-20 (×13): qty 1

## 2018-08-20 MED ORDER — HYDROMORPHONE HCL 2 MG PO TABS
4.0000 mg | ORAL_TABLET | ORAL | Status: DC | PRN
  Administered 2018-08-20 – 2018-08-28 (×19): 4 mg via ORAL
  Filled 2018-08-20 (×20): qty 2

## 2018-08-20 NOTE — Progress Notes (Signed)
PROGRESS NOTE    Alexander Fry  AGT:[id number] DOB: 02/26/1961 DOA: 08/06/2018 PCP: [name], PA-C Brief Narrative:57 y.o.male,w hx of metastatic renal cell carcinoma (bone, brain, lung), h/o R IJ thrombus, Iron deficiency anemia, w recent admission for abdominal pain where he was found to have duodenitis. Pt apparently went home and continued to have abdominal pain which was worse yesterday associated with some n/v. Pt denies fever, chills, diarrhea, brbpr, black stool. Pt has been taking protonix. Denies NSAIDS.   Interim history Admitted for acute pancreatitis. Gastroenterology consulted and appreciated. Continues to have abdominal pain and is requiring both IV as well as oral pain medications quite frequently.   Assessment & Plan:   Principal Problem:   Pancreatitis Active Problems:   Transaminitis   Abdominal pain   Nausea & vomiting   Hypokalemia   Diabetes mellitus type 2 in obese (H[facility])   Internal jugular (IJ) vein thromboembolism, chronic, right ([facility])   Normocytic anemia   Duodenitis   Uncontrolled pain   Metastatic renal cell carcinoma ([place])   Full code status  #1 acute pancreatitis resolved.  CT scan of the abdomen repeated shows findings questionable findings of bowel obstruction but patient has been passing gas and moving bowels and is using narcotics for pain.  At this time there is no evidence of acute bowel obstruction.  CT scan also revealed some evidence of duodenitis which was treated with Cipro and Flagyl.  IV steroids changed to prednisone on September 12.  Abnormal LFTs have been trending down   #2 metastatic renal cell carcinoma -follow-up PET scan as an outpatient patient has no interest to see a urologist at this time.  He will consider seeing a urologist as an outpatient.  He does not want to have any kind of surgery at this time.  #3 right IJ thrombus on Xarelto continue.  Per Dr. Marin Olp.  #4 lower extremity edema  multifactorial advised him to keep his feet elevated when he is in bed.  Ambulate as much as possible.  Continue TED hose.  #5 hypokalemia resolved.   DVT prophylaxis: xarelto Code Status:full Family Communication:none Disposition Plan: plan dc in 24 hours Consultants:  onc palliative Procedures:mrcp Antimicrobials: none Subjective:feels better had bowel movementS PAIN BETTER after increasing the patch to 100 mcg  Objective: Vitals:   08/19/18 2025 08/20/18 0411 08/20/18 0555 08/20/18 1259  BP: 131/83 135/80  130/80  Pulse: 80 82  90  Resp: 18 16  (!) 21  Temp: 98 F (36.7 C) 97.8 F (36.6 C)  98.1 F (36.7 C)  TempSrc:  Oral  Oral  SpO2: 97% 98%  99%  Weight:   99.8 kg   Height:        Intake/Output Summary (Last 24 hours) at 08/20/2018 1413 Last data filed at 08/20/2018 1300 Gross per 24 hour  Intake 820 ml  Output 850 ml  Net -30 ml   Filed Weights   08/18/18 0413 08/19/18 0330 08/20/18 0555  Weight: 99.4 kg 98.7 kg 99.8 kg    Examination:  General exam: Appears calm and comfortable  Respiratory system: Clear to auscultation. Respiratory effort normal. Cardiovascular system: S1 & S2 heard, RRR. No JVD, murmurs, rubs, gallops or clicks. No pedal edema. Gastrointestinal system: Abdomen is distended, and nontender. No organomegaly or masses felt. Normal bowel sounds heard. Central nervous system: Alert and oriented. No focal neurological deficits. Extremities: Symmetric 5 x 5 power. Skin: No rashes, lesions or ulcers Psychiatry: Judgement and insight appear normal. Mood &  affect appropriate.     Data Reviewed: I have personally reviewed following labs and imaging studies  CBC: Recent Labs  Lab 08/16/18 0556 08/17/18 0503 08/18/18 0431 08/19/18 0329 08/20/18 0328  WBC 16.2* 19.9* 13.4* 14.1* 9.9  HGB 10.9* 10.7* 10.2* 10.7* 9.2*  HCT 33.9* 33.2* 32.8* 34.1* 29.4*  MCV 77.2* 79.4 81.6 81.0 81.7  PLT 489* 447* 411* 425* 578   Basic Metabolic  Panel: Recent Labs  Lab 08/16/18 0556 08/17/18 0503 08/18/18 0431 08/19/18 0329 08/20/18 0328  NA 133* 138 137 139 138  K 3.6 4.0 4.0 3.5 3.4*  CL 97* 99 99 100 101  CO2 _0 GLUCOSE 158* 141* 142* 117* 107*  BUN _1 CREATININE 0.52* 0.57* 0.60* 0.59* 0.73  CALCIUM 8.9 9.2 8.8* 9.1 8.5*  MG 1.9  --   --   --   --    GFR: Estimated Creatinine Clearance: 123.1 mL/min (by C-G formula based on SCr of 0.73 mg/dL). Liver Function Tests: Recent Labs  Lab 08/16/18 0556 08/17/18 0503 08/18/18 0431 08/19/18 0329 08/20/18 0328  AST 49* 27 34 56* 44*  ALT 82* 60* 54* 67* 58*  ALKPHOS 1,062* 875* 778* 870* 671*  BILITOT 8.2* 6.0* 5.3* 5.4* 4.5*  PROT 6.1* 6.1* 5.9* 5.9* 5.2*  ALBUMIN 2.3* 2.3* 2.3* 2.6* 2.2*   Recent Labs  Lab 08/15/18 0836  LIPASE 80*   No results for input(s): AMMONIA in the last 168 hours. Coagulation Profile: Recent Labs  Lab 08/16/18 1033  INR 1.25   Cardiac Enzymes: No results for input(s): CKTOTAL, CKMB, CKMBINDEX, TROPONINI in the last 168 hours. BNP (last 3 results) No results for input(s): PROBNP in the last 8760 hours. HbA1C: No results for input(s): HGBA1C in the last 72 hours. CBG: Recent Labs  Lab 08/19/18 2045 08/20/18 0004 08/20/18 0409 08/20/18 0800 08/20/18 1146  GLUCAP 132* 116* 102* 113* 126*   Lipid Profile: No results for input(s): CHOL, HDL, LDLCALC, TRIG, CHOLHDL, LDLDIRECT in the last 72 hours. Thyroid Function Tests: No results for input(s): TSH, T4TOTAL, FREET4, T3FREE, THYROIDAB in the last 72 hours. Anemia Panel: No results for input(s): VITAMINB12, FOLATE, FERRITIN, TIBC, IRON, RETICCTPCT in the last 72 hours. Sepsis Labs: No results for input(s): PROCALCITON, LATICACIDVEN in the last 168 hours.  No results found for this or any previous visit (from the past 240 hour(s)).       Radiology Studies: No results found.      Scheduled Meds: . diclofenac sodium  2 g Topical QID  .  docusate sodium  100 mg Oral BID  . fentaNYL  100 mcg Transdermal Q72H  . insulin aspart  0-9 Units Subcutaneous Q4H  . linaclotide  145 mcg Oral QAC breakfast  . naloxegol oxalate  25 mg Oral Daily  . ondansetron (ZOFRAN) IV  4 mg Intravenous Q8H  . pantoprazole  40 mg Oral BID  . peg 3350 powder  0.5 kit Oral Once  . polyethylene glycol  34 g Oral Daily  . predniSONE  60 mg Oral Q breakfast  . rivaroxaban  10 mg Oral Daily  . sucralfate  1 g Oral TID WC & HS   Continuous Infusions: . sodium chloride 250 mL (08/19/18 1307)  . dextrose 5 % and 0.9% NaCl 10 mL/hr at 08/14/18 1957     LOS: 13 days     Georgette Shell, MD [facility] If 7PM-7AM, please contact night-coverage [email or web address] Password [facility] 08/20/2018,  2:13 PM

## 2018-08-20 NOTE — Progress Notes (Signed)
                                                                                                                                                                                                         Daily Progress Note   Patient Name: Alexander Fry       Date: 08/20/2018 DOB: 07/18/1961  Age: 57 y.o. MRN#: [id number] Attending Physician: Mathews, Elizabeth G, MD Primary Care Physician: Cummings, Charley Elizabeth, PA-C Admit Date: 08/06/2018  Reason for Consultation/Follow-up: Establishing goals of care and Pain control  Subjective:  I saw and examined Alexander Fry today.  Again discussed his pain at length.  He does report continuing to have good BMs.  Reviewed his medication usage over the last 24 hours.    Discussed case and plan with Dr. Matthews.    See below.  Length of Stay: 13  Current Medications: Scheduled Meds:  . diclofenac sodium  2 g Topical QID  . docusate sodium  100 mg Oral BID  . fentaNYL  100 mcg Transdermal Q72H  . insulin aspart  0-9 Units Subcutaneous Q4H  . linaclotide  145 mcg Oral QAC breakfast  . naloxegol oxalate  25 mg Oral Daily  . ondansetron (ZOFRAN) IV  4 mg Intravenous Q8H  . pantoprazole  40 mg Oral BID  . peg 3350 powder  0.5 kit Oral Once  . polyethylene glycol  34 g Oral Daily  . predniSONE  60 mg Oral Q breakfast  . rivaroxaban  10 mg Oral Daily  . sucralfate  1 g Oral TID WC & HS    Continuous Infusions: . sodium chloride 250 mL (08/20/18 1643)  . dextrose 5 % and 0.9% NaCl 10 mL/hr at 08/14/18 1957    PRN Meds: sodium chloride, acetaminophen **OR** acetaminophen, alum & mag hydroxide-simeth, HYDROmorphone (DILAUDID) injection, HYDROmorphone, iopamidol, metoCLOPramide, simethicone, sodium chloride flush  Physical Exam      General: Alert, awake, in no acute distress. Standing at bedside walking hall and room HEENT: No bruits, no goiter, no JVD Heart: Regular rate and rhythm. No murmur appreciated. Lungs: Good air movement,  clear Abdomen: Soft, nontender, + distended, positive bowel sounds.  Ext: Some edema Skin: Warm and dry Neuro: Grossly intact, nonfocal.      Vital Signs: BP 130/80 (BP Location: Left Arm)   Pulse 90   Temp 98.1 F (36.7 C) (Oral)   Resp (!) 21   Ht 6' (1.829 m)   Wt 99.8 kg   SpO2 99%   BMI 29.84 kg/m  SpO2: SpO2: 99 % O2   Device: O2 Device: Room Air O2 Flow Rate:    Intake/output summary:   Intake/Output Summary (Last 24 hours) at 08/20/2018 1812 Last data filed at 08/20/2018 1300 Gross per 24 hour  Intake 720 ml  Output 400 ml  Net 320 ml   LBM: Last BM Date: 08/19/18 Baseline Weight: Weight: 97.5 kg Most recent weight: Weight: 99.8 kg       Palliative Assessment/Data:    Flowsheet Rows     Most Recent Value  Intake Tab  Referral Department  Hospitalist  Unit at Time of Referral  Other (Comment) [urology/telementry]  Palliative Care Primary Diagnosis  Cancer  Date Notified  08/15/18  Palliative Care Type  New Palliative care  Reason for referral  Clarify Goals of Care, Non-pain Symptom  Date of Admission  08/06/18  Date first seen by Palliative Care  08/15/18  # of days Palliative referral response time  0 Day(s)  # of days IP prior to Palliative referral  9  Clinical Assessment  Psychosocial & Spiritual Assessment  Palliative Care Outcomes      Patient Active Problem List   Diagnosis Date Noted  . Full code status   . Uncontrolled pain   . Metastatic renal cell carcinoma ([place])   . Pancreatitis 08/07/2018  . Abdominal pain 08/07/2018  . Nausea & vomiting 08/07/2018  . Hypokalemia 08/07/2018  . Diabetes mellitus type 2 in obese ([place]) 08/07/2018  . Internal jugular (IJ) vein thromboembolism, chronic, right ([place]) 08/07/2018  . Normocytic anemia 08/07/2018  . Duodenitis 08/07/2018  . Malnutrition of moderate degree 08/04/2018  . Elevated LFTs 08/01/2018  . Iron deficiency anemia due to chronic blood loss 05/23/2018  . Hematuria, gross 05/03/2018   . Sleep disturbance 04/06/2018  . Knee arthropathy 04/03/2018  . Bilateral primary osteoarthritis of knee 04/03/2018  . Drug-induced jaundice 04/21/2017  . Rash and nonspecific skin eruption 04/21/2017  . Transaminitis 02/20/2017  . Onychomycosis of left great toe 02/17/2017  . Corn of foot 02/17/2017  . Brain metastases ([place]) 01/30/2017  . Bone metastases ([place]) 01/30/2017  . Internal hemorrhoids 01/07/2017  . Kidney cancer, primary, with metastasis from kidney to other site, right ([place]) 01/05/2017  . Acute right-sided low back pain with right-sided sciatica 12/24/2016  . Goals of care, counseling/discussion 12/22/2016  . Benign prostatic hyperplasia with nocturia 12/10/2016  . Abnormal CT of the chest 12/10/2016  . Hilar adenopathy 12/10/2016  . Multiple pulmonary nodules determined by computed tomography of lung 12/10/2016    Palliative Care Assessment & Plan   Recommendations/Plan:  Pain: Has had decline in need for IV pain medications since increasing his fentanyl patch is to 190mg/h.  He has used 458mIV dialudid (8044mral morphine equivalent) in the last 24 hours, and has needed only one dose since this morning.  Feels pain now fairly well controlled.  Hopeful to begin working transition off of IV rescue medications and will therefore addition of Dilaudid 4 mg p.o. every 3 hours as needed for pain.  I also left his IV Dilaudid 1 mg every 3 hours as needed for breakthrough pain not relieved by oral pain medications to be given 40 minutes after oral medication if oral medication is ineffective.  Goals of Care and Additional Recommendations:  Limitations on Scope of Treatment: Full Scope Treatment  Code Status:    Code Status Orders  (From admission, onward)         Start     Ordered   08/17/18 1602  Full code  Continuous     08/17/18 1601        Code Status History    Date Active Date Inactive Code Status Order ID Comments User Context   08/16/2018 1451 08/17/2018  1601 DNR [id number]  Pershing Proud, NP Inpatient   08/07/2018 0228 08/16/2018 1451 Full Code [id number]  [name], MD ED   08/01/2018 1500 08/04/2018 1719 Full Code [id number]  [name], MD ED   04/21/2017 1741 04/23/2017 1218 Full Code [id number]  [name], MD Inpatient       Prognosis:   Unable to determine  Discharge Planning:  To Be Determined  Care plan was discussed with patient, RN  Thank you for allowing the Palliative Medicine Team to assist in the care of this patient.   Total Time 30 Prolonged Time Billed No      Greater than 50%  of this time was spent counseling and coordinating care related to the above assessment and plan.  Micheline Rough, MD  Please contact Palliative Medicine Team phone at [phone] for questions and concerns.

## 2018-08-21 ENCOUNTER — Inpatient Hospital Stay (HOSPITAL_COMMUNITY): Payer: BLUE CROSS/BLUE SHIELD

## 2018-08-21 LAB — CBC
HCT: 30.5 % — ABNORMAL LOW (ref 39.0–52.0)
Hemoglobin: 9.6 g/dL — ABNORMAL LOW (ref 13.0–17.0)
MCH: 25.7 pg — AB (ref 26.0–34.0)
MCHC: 31.5 g/dL (ref 30.0–36.0)
MCV: 81.8 fL (ref 78.0–100.0)
Platelets: 372 10*3/uL (ref 150–400)
RBC: 3.73 MIL/uL — ABNORMAL LOW (ref 4.22–5.81)
RDW: 20.4 % — ABNORMAL HIGH (ref 11.5–15.5)
WBC: 12.8 10*3/uL — ABNORMAL HIGH (ref 4.0–10.5)

## 2018-08-21 LAB — COMPREHENSIVE METABOLIC PANEL
ALK PHOS: 937 U/L — AB (ref 38–126)
ALT: 78 U/L — AB (ref 0–44)
AST: 65 U/L — ABNORMAL HIGH (ref 15–41)
Albumin: 2.6 g/dL — ABNORMAL LOW (ref 3.5–5.0)
Anion gap: 10 (ref 5–15)
BILIRUBIN TOTAL: 4.5 mg/dL — AB (ref 0.3–1.2)
BUN: 15 mg/dL (ref 6–20)
CALCIUM: 8.9 mg/dL (ref 8.9–10.3)
CO2: 28 mmol/L (ref 22–32)
Chloride: 98 mmol/L (ref 98–111)
Creatinine, Ser: 0.82 mg/dL (ref 0.61–1.24)
GFR calc non Af Amer: >60 mL/min (ref >60)
Glucose, Bld: 121 mg/dL — ABNORMAL HIGH (ref 70–99)
Potassium: 3.4 mmol/L — ABNORMAL LOW (ref 3.5–5.1)
Sodium: 136 mmol/L (ref 135–145)
TOTAL PROTEIN: 5.9 g/dL — AB (ref 6.5–8.1)

## 2018-08-21 LAB — GLUCOSE, CAPILLARY
GLUCOSE-CAPILLARY: 136 mg/dL — AB (ref 70–99)
GLUCOSE-CAPILLARY: 87 mg/dL (ref 70–99)
GLUCOSE-CAPILLARY: 96 mg/dL (ref 70–99)
Glucose-Capillary: 124 mg/dL — ABNORMAL HIGH (ref 70–99)
Glucose-Capillary: 136 mg/dL — ABNORMAL HIGH (ref 70–99)
Glucose-Capillary: 94 mg/dL (ref 70–99)
Glucose-Capillary: 95 mg/dL (ref 70–99)

## 2018-08-21 MED ORDER — POLYETHYLENE GLYCOL 3350 17 G PO PACK
17.0000 g | PACK | Freq: Three times a day (TID) | ORAL | Status: DC
  Administered 2018-08-21 (×3): 17 g via ORAL
  Filled 2018-08-21 (×2): qty 1

## 2018-08-21 MED ORDER — METOCLOPRAMIDE HCL 10 MG PO TABS
10.0000 mg | ORAL_TABLET | Freq: Three times a day (TID) | ORAL | Status: DC
Start: 2018-08-21 — End: 2018-08-22
  Administered 2018-08-21 (×3): 10 mg via ORAL
  Filled 2018-08-21 (×3): qty 1

## 2018-08-21 MED ORDER — POTASSIUM CHLORIDE CRYS ER 20 MEQ PO TBCR
40.0000 meq | EXTENDED_RELEASE_TABLET | Freq: Once | ORAL | Status: AC
  Administered 2018-08-21: 40 meq via ORAL
  Filled 2018-08-21: qty 2

## 2018-08-21 NOTE — Progress Notes (Signed)
Unfortunately, Alexander Fry is having more problems.  His abdomen is quite distended.  I do not hear a lot of bowel sounds.  As such, I worry about him developing an ileus.  He is passing some gas but no stool.  He is on quite a bit of medicine to help go to the bathroom.  His liver function test yesterday look a little bit better.  His bilirubin was 4.5.  Alkaline phosphatase was 671.  There is no labs back yet today.  I just feel bad for him.  He is getting frustrated by the lack of progress.  Again it sounds and looks like he has an ileus right now.  He will be interesting to see what his electrolytes show today.  He is not complaining much of the way of pain.  There is no bleeding.  He really is not eating much.  I think he is having some clear liquids.  There is been no fever.  He has had no cough.  His vital signs all look pretty good.  His temperature is 97.4.  Pulse 82.  Blood pressure 128/79.  His abdomen is quite distended.  Bowel sounds are markedly decreased.  He is hypertympanic to percussion.  There is no obvious fluid wave.  Lungs are clear.  Cardiac exam regular rate and rhythm.  Extremities shows edema in his legs.  Neurological exam is nonfocal.  Alexander Fry is just not able to go home yet.  He now has this possible ileus.  It just looks and sounds like an ileus.  We will see what his abdominal x-ray shows.  I appreciate everybody's help on 4 E.  I know everybody is trying their best and doing all that they can do.  Lattie Haw, MD  Darlyn Chamber 33:6

## 2018-08-21 NOTE — Progress Notes (Signed)
PROGRESS NOTE    Alexander Fry  KNL:[id number] DOB: March 22, 1961 DOA: 08/06/2018 PCP: [name], PA-C Brief Narrative:57 y.o.male,w hx of metastatic renal cell carcinoma (bone, brain, lung), h/o R IJ thrombus, Iron deficiency anemia, w recent admission for abdominal pain where he was found to have duodenitis. Pt apparently went home and continued to have abdominal pain which was worse yesterday associated with some n/v. Pt denies fever, chills, diarrhea, brbpr, black stool. Pt has been taking protonix. Denies NSAIDS.   Interim history Admitted for acute pancreatitis. Gastroenterology consulted and appreciated. Continues to have abdominal pain and is requiring both IV as well as oral pain medications quite frequently.  Assessment & Plan:   Principal Problem:   Pancreatitis Active Problems:   Transaminitis   Abdominal pain   Nausea & vomiting   Hypokalemia   Diabetes mellitus type 2 in obese (HCC)   Internal jugular (IJ) vein thromboembolism, chronic, right (HCC)   Normocytic anemia   Duodenitis   Uncontrolled pain   Metastatic renal cell carcinoma ([place])   Full code status  #1 acute pancreatitis resolved. CT scan of the abdomen repeated shows findings questionable findings of bowel obstruction but patient has been passing gas and moving bowels and is using narcotics for pain. At this time there is no evidence of acute bowel obstruction. CT scan also revealed some evidence of duodenitis which was treated with Cipro and Flagyl. IV steroids changed to prednisone on September 12. Abnormal LFTs have been trending down .  Patient REPORTED having a bowel movement this morning.  I have encouraged him to take MiraLAX 3 times a day.  He is hesitant to increase the dose of MiraLAX.  He was on a high dose of MiraLAX along with Linzess and Movantik.  Follow-up KUB for today ordered.  Change Reglan to standing dose.  #2 metastatic renal cell carcinoma-follow-up PET scan as  an outpatient patient has no interest to see a urologist at this time. He will consider seeing a urologist as an outpatient. He does not want to have any kind of surgery at this time.  #3 right IJ thrombus on Xarelto continue. Per Dr. Marin Olp.  #4 lower extremity edema multifactorial advised him to keep his feet elevated when he is in bed. Ambulate as much as possible. Continue TED hose.  #5 hypokalemia rePLETE.    DVT prophylaxis: Xarelto Code Status full code Family Communication: None Disposition Plan: Plan discharge tomorrow 08/22/2018 if he continues to have BM and improves.  Patient is ambulating in the hallway multiple times during the day. Consultants:  Palliative care, oncology Procedures: MRCP Antimicrobials Cipro and Flagyl  Subjective:  Feels better after having bowel movements morning however feels belly is distended.  Having flatus. Objective: Vitals:   08/20/18 2036 08/21/18 0416 08/21/18 0802 08/21/18 1252  BP: 139/83 128/79  (!) 135/92  Pulse: 83 82  (!) 101  Resp: 18 18    Temp: 97.6 F (36.4 C) (!) 97.4 F (36.3 C)  98.3 F (36.8 C)  TempSrc:  Oral    SpO2: 100% 100%  100%  Weight:   101.3 kg   Height:        Intake/Output Summary (Last 24 hours) at 08/21/2018 1316 Last data filed at 08/21/2018 0915 Gross per 24 hour  Intake 250 ml  Output 250 ml  Net 0 ml   Filed Weights   08/19/18 0330 08/20/18 0555 08/21/18 0802  Weight: 98.7 kg 99.8 kg 101.3 kg    Examination:  General  exam: Appears calm and comfortable  Respiratory system: Clear to auscultation. Respiratory effort normal. Cardiovascular system: S1 & S2 heard, RRR. No JVD, murmurs, rubs, gallops or clicks. No pedal edema. Gastrointestinal system: Abdomen is distended, soft and nontender. No organomegaly or masses felt. Normal bowel sounds heard. Central nervous system: Alert and oriented. No focal neurological deficits. Extremities: 2 PLUS EDEMA Skin: No rashes, lesions or  ulcers Psychiatry: Judgement and insight appear normal. Mood & affect appropriate.     Data Reviewed: I have personally reviewed following labs and imaging studies  CBC: Recent Labs  Lab 08/17/18 0503 08/18/18 0431 08/19/18 0329 08/20/18 0328 08/21/18 0359  WBC 19.9* 13.4* 14.1* 9.9 12.8*  HGB 10.7* 10.2* 10.7* 9.2* 9.6*  HCT 33.2* 32.8* 34.1* 29.4* 30.5*  MCV 79.4 81.6 81.0 81.7 81.8  PLT 447* 411* 425* 329 789   Basic Metabolic Panel: Recent Labs  Lab 08/16/18 0556 08/17/18 0503 08/18/18 0431 08/19/18 0329 08/20/18 0328 08/21/18 0711  NA 133* 138 137 139 138 136  K 3.6 4.0 4.0 3.5 3.4* 3.4*  CL 97* 99 99 100 101 98  CO2 24 26 28 29 29 28   GLUCOSE 158* 141* 142* 117* 107* 121*  BUN 10 13 14 14 15 15   CREATININE 0.52* 0.57* 0.60* 0.59* 0.73 0.82  CALCIUM 8.9 9.2 8.8* 9.1 8.5* 8.9  MG 1.9  --   --   --   --   --    GFR: Estimated Creatinine Clearance: 121 mL/min (by C-G formula based on SCr of 0.82 mg/dL). Liver Function Tests: Recent Labs  Lab 08/17/18 0503 08/18/18 0431 08/19/18 0329 08/20/18 0328 08/21/18 0711  AST 27 34 56* 44* 65*  ALT 60* 54* 67* 58* 78*  ALKPHOS 875* 778* 870* 671* 937*  BILITOT 6.0* 5.3* 5.4* 4.5* 4.5*  PROT 6.1* 5.9* 5.9* 5.2* 5.9*  ALBUMIN 2.3* 2.3* 2.6* 2.2* 2.6*   Recent Labs  Lab 08/15/18 0836  LIPASE 80*   No results for input(s): AMMONIA in the last 168 hours. Coagulation Profile: Recent Labs  Lab 08/16/18 1033  INR 1.25   Cardiac Enzymes: No results for input(s): CKTOTAL, CKMB, CKMBINDEX, TROPONINI in the last 168 hours. BNP (last 3 results) No results for input(s): PROBNP in the last 8760 hours. HbA1C: No results for input(s): HGBA1C in the last 72 hours. CBG: Recent Labs  Lab 08/20/18 2054 08/21/18 0016 08/21/18 0446 08/21/18 0743 08/21/18 1129  GLUCAP 124* 136* 95 94 136*   Lipid Profile: No results for input(s): CHOL, HDL, LDLCALC, TRIG, CHOLHDL, LDLDIRECT in the last 72 hours. Thyroid Function  Tests: No results for input(s): TSH, T4TOTAL, FREET4, T3FREE, THYROIDAB in the last 72 hours. Anemia Panel: No results for input(s): VITAMINB12, FOLATE, FERRITIN, TIBC, IRON, RETICCTPCT in the last 72 hours. Sepsis Labs: No results for input(s): PROCALCITON, LATICACIDVEN in the last 168 hours.  No results found for this or any previous visit (from the past 240 hour(s)).       Radiology Studies: No results found.      Scheduled Meds: . diclofenac sodium  2 g Topical QID  . docusate sodium  100 mg Oral BID  . fentaNYL  100 mcg Transdermal Q72H  . insulin aspart  0-9 Units Subcutaneous Q4H  . linaclotide  145 mcg Oral QAC breakfast  . metoCLOPramide  10 mg Oral TID AC & HS  . naloxegol oxalate  25 mg Oral Daily  . ondansetron (ZOFRAN) IV  4 mg Intravenous Q8H  . pantoprazole  40  mg Oral BID  . polyethylene glycol  17 g Oral TID  . predniSONE  60 mg Oral Q breakfast  . rivaroxaban  10 mg Oral Daily  . sucralfate  1 g Oral TID WC & HS   Continuous Infusions: . sodium chloride 250 mL (08/20/18 1643)  . dextrose 5 % and 0.9% NaCl 10 mL/hr at 08/14/18 1957     LOS: 14 days    Georgette Shell, MD [facility]  If 7PM-7AM, please contact night-coverage [email or web address] Password [email or web address] 08/21/2018, 1:16 PM

## 2018-08-21 NOTE — Progress Notes (Signed)
Palliative:  I met today with Mr. Divirgilio again and nephew at bedside. He is much less jaundice. He is in good spirits today. He appears to be having better pain control and requiring less frequent prn dilaudid (he still prefers IV dilaudid). He does share that he believes the steroids are vital to his pain relief as well as clearing obstruction and constipation in which I agree. Abd is still tight and distended but he says that he has had good bowel movements today, passing gas, and feels movement in his abd. Discussed small frequent meals and ambulation as well. Emotional support provided.   No changes to pain management regimen. Continue fentanyl and will need po dilaudid at home but I believe he will continue to use IV dilaudid in hospital. Continue bowel regimen as well.   25 min  Vinie Sill, NP Palliative Medicine Team Pager # [phone] (M-F 8a-5p) Team Phone # [phone] (Nights/Weekends)

## 2018-08-22 ENCOUNTER — Inpatient Hospital Stay (HOSPITAL_COMMUNITY): Payer: BLUE CROSS/BLUE SHIELD

## 2018-08-22 DIAGNOSIS — K56609 Unspecified intestinal obstruction, unspecified as to partial versus complete obstruction: Secondary | ICD-10-CM

## 2018-08-22 DIAGNOSIS — R609 Edema, unspecified: Secondary | ICD-10-CM

## 2018-08-22 LAB — CBC WITH DIFFERENTIAL/PLATELET
BASOS ABS: 0 10*3/uL (ref 0.0–0.1)
Basophils Relative: 0 %
EOS ABS: 0 10*3/uL (ref 0.0–0.7)
Eosinophils Relative: 0 %
HCT: 32.3 % — ABNORMAL LOW (ref 39.0–52.0)
Hemoglobin: 10.2 g/dL — ABNORMAL LOW (ref 13.0–17.0)
LYMPHS ABS: 1.6 10*3/uL (ref 0.7–4.0)
Lymphocytes Relative: 11 %
MCH: 25.9 pg — AB (ref 26.0–34.0)
MCHC: 31.6 g/dL (ref 30.0–36.0)
MCV: 82 fL (ref 78.0–100.0)
Monocytes Absolute: 1.3 10*3/uL — ABNORMAL HIGH (ref 0.1–1.0)
Monocytes Relative: 9 %
NEUTROS PCT: 80 %
Neutro Abs: 11.7 10*3/uL — ABNORMAL HIGH (ref 1.7–7.7)
Platelets: 429 10*3/uL — ABNORMAL HIGH (ref 150–400)
RBC: 3.94 MIL/uL — AB (ref 4.22–5.81)
RDW: 20.9 % — ABNORMAL HIGH (ref 11.5–15.5)
WBC: 14.7 10*3/uL — AB (ref 4.0–10.5)

## 2018-08-22 LAB — COMPREHENSIVE METABOLIC PANEL
ALT: 70 U/L — ABNORMAL HIGH (ref 0–44)
AST: 53 U/L — ABNORMAL HIGH (ref 15–41)
Albumin: 2.3 g/dL — ABNORMAL LOW (ref 3.5–5.0)
Alkaline Phosphatase: 878 U/L — ABNORMAL HIGH (ref 38–126)
Anion gap: 10 (ref 5–15)
BUN: 12 mg/dL (ref 6–20)
CHLORIDE: 99 mmol/L (ref 98–111)
CO2: 28 mmol/L (ref 22–32)
Calcium: 8.6 mg/dL — ABNORMAL LOW (ref 8.9–10.3)
Creatinine, Ser: 0.77 mg/dL (ref 0.61–1.24)
GFR calc Af Amer: >60 mL/min (ref >60)
Glucose, Bld: 107 mg/dL — ABNORMAL HIGH (ref 70–99)
POTASSIUM: 3.8 mmol/L (ref 3.5–5.1)
Sodium: 137 mmol/L (ref 135–145)
Total Bilirubin: 3.5 mg/dL — ABNORMAL HIGH (ref 0.3–1.2)
Total Protein: 5.8 g/dL — ABNORMAL LOW (ref 6.5–8.1)

## 2018-08-22 LAB — GLUCOSE, CAPILLARY
GLUCOSE-CAPILLARY: 104 mg/dL — AB (ref 70–99)
GLUCOSE-CAPILLARY: 115 mg/dL — AB (ref 70–99)
Glucose-Capillary: 89 mg/dL (ref 70–99)
Glucose-Capillary: 147 mg/dL — ABNORMAL HIGH (ref 70–99)
Glucose-Capillary: 85 mg/dL (ref 70–99)

## 2018-08-22 LAB — MAGNESIUM: MAGNESIUM: 2.1 mg/dL (ref 1.7–2.4)

## 2018-08-22 MED ORDER — LACTULOSE 10 GM/15ML PO SOLN
20.0000 g | Freq: Once | ORAL | Status: AC
  Administered 2018-08-22: 20 g via ORAL
  Filled 2018-08-22: qty 30

## 2018-08-22 MED ORDER — BISACODYL 10 MG RE SUPP
10.0000 mg | Freq: Once | RECTAL | Status: AC
  Administered 2018-08-22: 10 mg via RECTAL
  Filled 2018-08-22: qty 1

## 2018-08-22 MED ORDER — SODIUM CHLORIDE 0.9 % IV SOLN
INTRAVENOUS | Status: DC
  Administered 2018-08-22 – 2018-08-24 (×4): via INTRAVENOUS

## 2018-08-22 MED ORDER — FLEET ENEMA 7-19 GM/118ML RE ENEM: 1.0000 | ENEMA | Freq: Once | RECTAL | Status: DC | PRN

## 2018-08-22 MED ORDER — BOOST / RESOURCE BREEZE PO LIQD CUSTOM
1.0000 | Freq: Two times a day (BID) | ORAL | Status: DC
  Administered 2018-08-22: 1 via ORAL

## 2018-08-22 MED ORDER — BISACODYL 10 MG RE SUPP: 10.0000 mg | Freq: Every day | RECTAL | Status: DC | PRN

## 2018-08-22 MED ORDER — DEXAMETHASONE SODIUM PHOSPHATE 4 MG/ML IJ SOLN
8.0000 mg | Freq: Two times a day (BID) | INTRAMUSCULAR | Status: DC
  Administered 2018-08-22 – 2018-08-23 (×4): 8 mg via INTRAVENOUS
  Filled 2018-08-22 (×4): qty 2

## 2018-08-22 NOTE — Consult Note (Signed)
[facility] Surgery Consult Note  Alexander Fry 21-Jun-1961  [id number].    Requesting MD: Landis Gandy, MD Chief Complaint/Reason for Consult: SBO vs ileus  HPI:  Alexander Fry is a 57 y/o male with a history of renal cell carcinoma w/ brain, pulmonary, liver, lymph, and bone mets (followed by Dr. Marin Olp), DM, right IJ thrombus on Xarelto, and iron deficiency anemia admitted to the hospital 08/06/18 for management of acute pancreatitis.  He was last treated with chemotherapy prior to labor day.  This was stopped during his previous admission where he was found to have duodenitis and what was felt to be a duodenal ulcer on EGD.  This was felt to be secondary to the chemo he was receiving. The patient has been treated it seems like on and off for this duodenitis and pancreatitis for about the last 3-4 weeks.  This admission he has been treated for the same along with pain control from his multiple areas of metastatic disease. His "pancreatitis" seems to be improving and LFT's trending down.  It is unclear why his LFTs are up, except ALKPHOS could be from bony mets, but GI suspects Stauffer's syndrome a possibility.    Over the last couple of days, the patient has developed some increasing bowel distention on his films.  He is having some abdominal pain secondary to distention.  He did have some ascites on his abdominal scans, albeit, not much on the last scan.  He did pass some flatus today and had 2 BMs.  He has some nausea yesterday, but no vomiting.  We have been asked to see him for further recommendations.  ROS: Review of Systems  Constitutional: Negative for chills and fever.  Gastrointestinal: Positive for abdominal pain and nausea.  Musculoskeletal: Positive for back pain.  All other systems reviewed and are negative.   Family History  Problem Relation Age of Onset  . Heart disease Mother   . Diabetes Mother   . Heart disease Father   . Alcohol abuse Brother     Past Medical  History:  Diagnosis Date  . Bilateral primary osteoarthritis of knee 04/03/2018  . Duodenitis   . Goals of care, counseling/discussion 12/22/2016  . History of radiation therapy 02/11/2017   SRT right posterior frontal 12 mm target 20 Gy, Right anterior frontal 58m 20 Gy  . History of radiation therapy 02/21/2017   SRT Right post parietal 466mtarget 20 Gy, left Post parietal 31m47m0 Gy, Right Occipital 4 mm 20Gy, Left Occipital 4 mm 20 Gy  . History of radiation therapy 02/25/2017, 02/28/2017, 03/02/2017   SRT L1 spine 27 Gy 3 fractions, L4 Spine 27 Gy 3 fractions  . History of radiation therapy 06/07/2017   SRS- Brain  . History of radiation therapy 04/03/2018   Right ilium, 8 Gy in 1 fraction for a total dose of 8 Gy  . Inguinal hernia of left side without obstruction or gangrene   . Iron deficiency anemia due to chronic blood loss 05/23/2018  . Pneumonia    left lung  . Renal cell carcinoma, right ([facility] [facility]/[place]  . 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 His vision has "spots" at times    Past Surgical History:  Procedure Laterality Date  . BIOPSY  08/02/2018   Procedure: BIOPSY;  Surgeon: ManJuanita CraverD;  Location: [facility] ENDOSCOPY;  Service: Endoscopy;;  . ESOPHAGOGASTRODUODENOSCOPY N/A 08/02/2018   Procedure: ESOPHAGOGASTRODUODENOSCOPY (EGD);  Surgeon: ManJuanita CraverD;  [facility];  Service: Endoscopy;  Laterality: N/A;  . HERNIA REPAIR    . INGUINAL HERNIA REPAIR Left   . IR FLUORO GUIDE PORT INSERTION RIGHT  02/06/2018  . IR US GUIDE VASC ACCESS RIGHT  02/06/2018    Social History:  reports that he has never smoked. He has never used smokeless tobacco. He reports that he drinks alcohol. He reports that he does not use drugs.  Allergies:  Allergies  Allergen Reactions  . Feraheme [Ferumoxytol] Anaphylaxis and Other (See Comments)    Flush. Patient denies anaphylaxis.    Medications Prior to Admission  Medication Sig Dispense Refill  . [EXPIRED]  ciprofloxacin (CIPRO) 500 MG tablet Take 1 tablet (500 mg total) by mouth 2 (two) times daily for 4 days. 8 tablet 0  . docusate sodium (COLACE) 100 MG capsule Take 1 capsule (100 mg total) by mouth 2 (two) times daily. 60 capsule 0  . fentaNYL (DURAGESIC - DOSED MCG/HR) 25 MCG/HR patch Place 1 patch (25 mcg total) onto the skin every 3 (three) days. 10 patch 0  . glimepiride (AMARYL) 4 MG tablet Take 1 tablet (4 mg total) by mouth daily with breakfast. 30 tablet 4  . HYDROmorphone (DILAUDID) 4 MG tablet Take 1 tablet (4 mg total) by mouth every 6 (six) hours as needed for moderate pain or severe pain. 90 tablet 0  . metoCLOPramide (REGLAN) 10 MG tablet Take 10 mg by mouth 4 (four) times daily.    . [EXPIRED] metroNIDAZOLE (FLAGYL) 500 MG tablet Take 1 tablet (500 mg total) by mouth 3 (three) times daily for 4 days. 12 tablet 0  . pantoprazole (PROTONIX) 40 MG tablet Take 1 tablet (40 mg total) by mouth daily. 30 tablet 0  . polyethylene glycol (MIRALAX) packet Take 17 g by mouth daily. 30 packet 0  . rivaroxaban (XARELTO) 10 MG TABS tablet Take 1 tablet (10 mg total) by mouth daily with supper. (Patient taking differently: Take 20 mg by mouth daily with supper. ) 30 tablet 3  . dexamethasone (DECADRON) 0.5 MG/5ML solution Take 10 mLs (1 mg total) by mouth 4 (four) times daily. Swish in mouth for 2 minutes and spit out. Avoid eating/drinking for 1hr after rinse (Patient not taking: Reported on 08/06/2018) 500 mL 4  . everolimus (AFINITOR) 5 MG tablet Take 1 tablet (5 mg total) by mouth daily. (Patient not taking: Reported on 08/06/2018) 30 tablet 4    Blood pressure 115/81, pulse 78, temperature 98.1 F (36.7 C), temperature source Oral, resp. rate 17, height 6' (1.829 m), weight 101.8 kg, SpO2 96 %. Physical Exam: Physical Exam  General: pleasant, WD, WN white male who is sitting up in his chair in NAD HEENT: head is normocephalic, atraumatic.  Sclera are icteric.  PERRL.  Ears and nose without any  masses or lesions.  Mouth is pink and moist Heart: regular, rate, and rhythm.  Normal s1,s2. No obvious murmurs, gallops, or rubs noted.  Palpable radial pulses bilaterally.  Difficult to feel pedal pulses due to significant BLE edema, +3 Lungs: CTAB, no wheezes, rhonchi, or rales noted.  Respiratory effort nonlabored Abd: soft, minimally tender on R side, distended, but not tympanitic, dull as if distention may be secondary to some fluid, +BS, no masses, hernias, or organomegaly MS: all 4 extremities are symmetrical with no cyanosis, clubbing Skin: warm and dry with no masses, lesions, or rashes Psych: A&Ox3 with an appropriate affect.   Results for orders placed or performed during the hospital encounter of  08/06/18 (from the past 48 hour(s))  Glucose, capillary     Status: Abnormal   Collection Time: 08/20/18  4:44 PM  Result Value Ref Range   Glucose-Capillary 140 (H) 70 - 99 mg/dL  Glucose, capillary     Status: Abnormal   Collection Time: 08/20/18  8:54 PM  Result Value Ref Range   Glucose-Capillary 124 (H) 70 - 99 mg/dL  Glucose, capillary     Status: Abnormal   Collection Time: 08/21/18 12:16 AM  Result Value Ref Range   Glucose-Capillary 136 (H) 70 - 99 mg/dL  CBC     Status: Abnormal   Collection Time: 08/21/18  3:59 AM  Result Value Ref Range   WBC 12.8 (H) 4.0 - 10.5 K/uL   RBC 3.73 (L) 4.22 - 5.81 MIL/uL   Hemoglobin 9.6 (L) 13.0 - 17.0 g/dL   HCT 30.5 (L) 39.0 - 52.0 %   MCV 81.8 78.0 - 100.0 fL   MCH 25.7 (L) 26.0 - 34.0 pg   MCHC 31.5 30.0 - 36.0 g/dL   RDW 20.4 (H) 11.5 - 15.5 %   Platelets 372 150 - 400 K/uL    Comment: Performed at [facility], [place] [address] [place], [place] [address]  Glucose, capillary     Status: None   Collection Time: 08/21/18  4:46 AM  Result Value Ref Range   Glucose-Capillary 95 70 - 99 mg/dL  Comprehensive metabolic panel     Status: Abnormal   Collection Time: 08/21/18  7:11 AM  Result Value Ref Range    Sodium 136 135 - 145 mmol/L   Potassium 3.4 (L) 3.5 - 5.1 mmol/L   Chloride 98 98 - 111 mmol/L   CO2 28 22 - 32 mmol/L   Glucose, Bld 121 (H) 70 - 99 mg/dL   BUN 15 6 - 20 mg/dL   Creatinine, Ser 0.82 0.61 - 1.24 mg/dL   Calcium 8.9 8.9 - 10.3 mg/dL   Total Protein 5.9 (L) 6.5 - 8.1 g/dL   Albumin 2.6 (L) 3.5 - 5.0 g/dL   AST 65 (H) 15 - 41 U/L   ALT 78 (H) 0 - 44 U/L   Alkaline Phosphatase 937 (H) 38 - 126 U/L   Total Bilirubin 4.5 (H) 0.3 - 1.2 mg/dL   GFR calc non Af Amer >60 >60 mL/min   GFR calc Af Amer >60 >60 mL/min    Comment: (NOTE) The eGFR has been calculated using the CKD EPI equation. This calculation has not been validated in all clinical situations. eGFR's persistently <60 mL/min signify possible Chronic Kidney Disease.    Anion gap 10 5 - 15    Comment: Performed at [facility], [place] [address], [place], [place] [address]  Glucose, capillary     Status: None   Collection Time: 08/21/18  7:43 AM  Result Value Ref Range   Glucose-Capillary 94 70 - 99 mg/dL  Glucose, capillary     Status: Abnormal   Collection Time: 08/21/18 11:29 AM  Result Value Ref Range   Glucose-Capillary 136 (H) 70 - 99 mg/dL  Glucose, capillary     Status: None   Collection Time: 08/21/18  4:32 PM  Result Value Ref Range   Glucose-Capillary 96 70 - 99 mg/dL  Glucose, capillary     Status: Abnormal   Collection Time: 08/21/18  8:53 PM  Result Value Ref Range   Glucose-Capillary 124 (H) 70 - 99 mg/dL  Glucose, capillary  Status: None   Collection Time: 08/21/18 11:52 PM  Result Value Ref Range   Glucose-Capillary 87 70 - 99 mg/dL  Comprehensive metabolic panel     Status: Abnormal   Collection Time: 08/22/18  3:20 AM  Result Value Ref Range   Sodium 137 135 - 145 mmol/L   Potassium 3.8 3.5 - 5.1 mmol/L   Chloride 99 98 - 111 mmol/L   CO2 28 22 - 32 mmol/L   Glucose, Bld 107 (H) 70 - 99 mg/dL   BUN 12 6 - 20 mg/dL   Creatinine, Ser 0.77 0.61 - 1.24 mg/dL    Calcium 8.6 (L) 8.9 - 10.3 mg/dL   Total Protein 5.8 (L) 6.5 - 8.1 g/dL   Albumin 2.3 (L) 3.5 - 5.0 g/dL   AST 53 (H) 15 - 41 U/L   ALT 70 (H) 0 - 44 U/L   Alkaline Phosphatase 878 (H) 38 - 126 U/L   Total Bilirubin 3.5 (H) 0.3 - 1.2 mg/dL   GFR calc non Af Amer >60 >60 mL/min   GFR calc Af Amer >60 >60 mL/min    Comment: (NOTE) The eGFR has been calculated using the CKD EPI equation. This calculation has not been validated in all clinical situations. eGFR's persistently <60 mL/min signify possible Chronic Kidney Disease.    Anion gap 10 5 - 15    Comment: Performed at [facility], [place] [address] [place], [place] [address]  CBC with Differential/Platelet     Status: Abnormal   Collection Time: 08/22/18  3:20 AM  Result Value Ref Range   WBC 14.7 (H) 4.0 - 10.5 K/uL   RBC 3.94 (L) 4.22 - 5.81 MIL/uL   Hemoglobin 10.2 (L) 13.0 - 17.0 g/dL   HCT 32.3 (L) 39.0 - 52.0 %   MCV 82.0 78.0 - 100.0 fL   MCH 25.9 (L) 26.0 - 34.0 pg   MCHC 31.6 30.0 - 36.0 g/dL   RDW 20.9 (H) 11.5 - 15.5 %   Platelets 429 (H) 150 - 400 K/uL   Neutrophils Relative % 80 %   Neutro Abs 11.7 (H) 1.7 - 7.7 K/uL   Lymphocytes Relative 11 %   Lymphs Abs 1.6 0.7 - 4.0 K/uL   Monocytes Relative 9 %   Monocytes Absolute 1.3 (H) 0.1 - 1.0 K/uL   Eosinophils Relative 0 %   Eosinophils Absolute 0.0 0.0 - 0.7 K/uL   Basophils Relative 0 %   Basophils Absolute 0.0 0.0 - 0.1 K/uL    Comment: Performed at [facility], [place] [address] [place], [place] [address]  Magnesium     Status: None   Collection Time: 08/22/18  3:20 AM  Result Value Ref Range   Magnesium 2.1 1.7 - 2.4 mg/dL    Comment: Performed at [facility], [place] [address], [place], [place] [address]  Glucose, capillary     Status: None   Collection Time: 08/22/18  4:59 AM  Result Value Ref Range   Glucose-Capillary 89 70 - 99 mg/dL  Glucose, capillary     Status: Abnormal   Collection Time:  08/22/18  7:39 AM  Result Value Ref Range   Glucose-Capillary 104 (H) 70 - 99 mg/dL  Glucose, capillary     Status: Abnormal   Collection Time: 08/22/18 12:02 PM  Result Value Ref Range   Glucose-Capillary 115 (H) 70 - 99 mg/dL   Dg Abd 1 View  Result Date: 08/22/2018 CLINICAL DATA:  Abdominal distention,  history of renal cell carcinoma EXAM: ABDOMEN - 1 VIEW COMPARISON:  Abdomen films of 08/21/2017 and CT abdomen pelvis of 08/17/2018 FINDINGS: There is slightly more gaseous distention of small bowel loops consistent with small bowel obstruction. Very little colonic bowel gas is noted. No opaque calculi are seen. There are degenerative changes in the lower lumbar spine. IMPRESSION: Worsening gaseous distention of small bowel consistent with increasing small bowel obstruction. Electronically Signed   By: Ivar Drape M.D.   On: 08/22/2018 09:25   Dg Abd 1 View  Result Date: 08/21/2018 CLINICAL DATA:  Abdominal pain and nausea EXAM: ABDOMEN - 1 VIEW COMPARISON:  08/18/2018 FINDINGS: Scattered large and small bowel gas is noted. Persistent small bowel dilatation is noted similar to that seen on the prior exam consistent with partial small bowel obstruction. No acute bony abnormality is noted. No other focal abnormality is seen. IMPRESSION: Persistent changes of small-bowel obstruction. Electronically Signed   By: Inez Catalina M.D.   On: 08/21/2018 14:41   Assessment/Plan Metastatic renal call carcinoma Diabetes mellitus Normocytic anemia Acute pancreatitis - visible on MRI, improving, GI has signed off  Abdominal pain/distention, pSBO vs. Ileus This patient has a very complex history in regards to his abdomen.  He has been treated off and on with steroids and different PPI therapy etc for the last almost month for duodenal ulcers/duodenititis, possible pancreatitis, constipation, with extensive laxative and stool softener usage.  He has had increasing bowel dilatation on his recent x-rays.  he is  passing flatus and had 2 BMs today with the assistance of a suppository.  He does NOT have a complete bowel obstruction at this time as he is passing flatus and stool.  He may however have a partial small bowel obstruction, although he has never had any intra-abdominal operation, making adhesive disease less likely.  His scan does not show specific evidence of carcinomatosis at this time, but they couldn't completely rule it out.  He may just have an ileus secondary to reaction from his duodenitis/pancreatitis etc, along with his narcotic usage for pain control.  Given his bowel function and no nausea today, we will allow him to have clears and see how he does.  He is distended, but this is mostly dull to percussion and not tympanitic as I would expect with just bowel dilatation.  D/W Dr. Rodena Piety, will order an [name] ascites to see if he has an increase in ascites from his recent scan that would account for some of his distention, especially given fluid retention noted in his LEs.  No acute needs for surgical intervention at this time.  We will follow with you.    Jill Alexanders, [facility] Surgery 08/22/2018, 12:54 PM Pager: [phone] Consults: [phone]

## 2018-08-22 NOTE — Progress Notes (Signed)
Mr. Tukes still is having some issues with going to the bathroom.  I suspect that the abdominal x-ray that he had yesterday shows [name] the problem.  He still has a small bowel obstruction.  This appears to be somewhat persistent.  I think that the options for this now would be to put an NG tube down him to see if that can decompress his intestines or try to get surgery to operate on him and see about removing the area of obstruction.  He is not as uncomfortable.  He still is somewhat distended.  His labs with his LFTs are improving.  His bilirubin now is 3.5.  His alkaline phosphatase is 878.  His creatinine is 0.77.  His albumin is 2.3.  He really is not able to eat much right now.  This is becoming more of an issue.  He has had no fever.  He has had no cough.  He has had no bleeding.  His vital signs are temperature 98.2.  Pulse 80.  Blood pressure 121/75.  His abdomen is slightly distended.  He he has some high-pitched bowel sounds.  There is some hypertympany to percussion.  He is slightly tender in the periumbilical area.  There is no palpable hepatomegaly.  Lungs are clear.  Cardiac exam regular rate and rhythm.  Extremities still show some 1+ edema.  Neurological exam is nonfocal.  I suspect that the issue right now is this persistent small bowel obstruction.  Again, I think the options are having an NG tube placed or having surgery see if they would operate on him to resect out the obstruction.  I am not sure why he would have the obstruction.  I would not think this is from his malignancy with his renal cell cancer unless he has carcinomatosis which as far as I can tell he does not have.  I very much appreciate the great care that he is getting from everybody on 4 E.  I know that everybody is doing a great job with him.  Lattie Haw, MD  Jenny Reichmann 5:8

## 2018-08-22 NOTE — Progress Notes (Signed)
Initial Nutrition Assessment  DOCUMENTATION CODES:   Non-severe (moderate) malnutrition in context of chronic illness  INTERVENTION:  - Will order Boost Breeze BID, each supplement provides 250 kcal and 9 grams of protein. - Recommend multivitamin with minerals (if one available without ferumoxytol). - Will continue to monitor POC and associated nutrition-related needs.    NUTRITION DIAGNOSIS:   Moderate Malnutrition related to chronic illness, catabolic illness, cancer and cancer related treatments as evidenced by mild fat depletion, mild muscle depletion.  GOAL:   Patient will meet greater than or equal to 90% of their needs  MONITOR:   PO intake, Supplement acceptance, Diet advancement, Weight trends, Labs  REASON FOR ASSESSMENT:   LOS(15 days)  ASSESSMENT:   57 y.o. male with hx of metastatic renal cell carcinoma (bone, brain, lung), R IJ thrombus, and iron deficiency anemia. He was recently admitted for abdominal pain and was found to have duodenitis. Following d/c, patient continued to have abdominal pain which acutely worsened on the day of admission (9/1) and was associated with N/V. He was taking Protonix PTA. Patient admitted for acute pancreatitis, which is now resolved. Patient continues to have abdominal pain and is now noted to have SBO, Surgery being re-consulted as of 9/17.  BMI indicates obesity but weight +9 lb since admission which is likely fluid related; admission weight indicated overweight status. Patient was previously on Soft diet but being downgraded to CLD starting this afternoon d/t abdominal distention, imaging indicating SBO. Notes indicate plan for Surgery re-consult. Patient reports being able to consume veggie broth for breakfast without worsening in abdominal pain or nausea but states that he has not been passing gas or having BMs so it is difficult to want to eat or drink anything. He states ongoing pressure and rumbling in his stomach.   Patient  states that he often becomes constipated from consuming cheese or other dairy items and last week he ate grilled cheese several times. Also talked with patient about other foods that may cause constipation and encouraged limiting these and/or ensuring chewing very thoroughly and drinking plenty of liquids throughout the day.   He states that he had been on pain medications as well as oral chemo medications and that he was not taking any stool softeners or laxatives and feels that side effects of pills may have slowly led to current blockage.   Per Dr. Rodena Piety' note this AM: "KUB done today shows worsening gaseous distention of small bowel consistent with increasing small bowel obstruction," plan for follow-up PET scan as an outpatient, BLE edema.  Patient was seen by another RD on 8/29 at which time he reported 2-3 week hx of poor appetite d/t abdominal pain and chemo side effects. Patient had been drinking [employer] prior to and during that admission.     Medications reviewed; 100 mg Colace BID, sliding scale Novolog, 20 g lactulose x1 dose today, 40 mg Protonix BID, 40 mEq oral KCl x1 dose yesterday, 1 g Carafate TID. Labs reviewed; CBGs: 89, 104, and 115 mg/dL today, Ca: 8.6 mg/dL, Alk Phos elevated, LFTs elevated.  IVF; NS @ 75 mL/hr; D5-NS @ 10 mL/hr (41 kcal).    NUTRITION - FOCUSED PHYSICAL EXAM:    Most Recent Value  Orbital Region  Mild depletion  Upper Arm Region  Mild depletion  Thoracic and Lumbar Region  Unable to assess  Buccal Region  No depletion  Temple Region  No depletion  Clavicle Bone Region  Mild depletion  Clavicle and Acromion  Bone Region  Mild depletion  Scapular Bone Region  Unable to assess  Dorsal Hand  No depletion  Patellar Region  No depletion  Anterior Thigh Region  Unable to assess  Posterior Calf Region  Mild depletion  Edema (RD Assessment)  None  Hair  Reviewed  Eyes  Reviewed  Mouth  Reviewed  Skin  Reviewed  Nails  Reviewed        Diet Order:   Diet Order            Diet clear liquid Room service appropriate? Yes; Fluid consistency: Thin  Diet effective now              EDUCATION NEEDS:   Education needs have been addressed  Skin:  Skin Assessment: Reviewed RN Assessment  Last BM:  9/16  Height:   Ht Readings from Last 1 Encounters:  08/06/18 6' (1.829 m)    Weight:   Wt Readings from Last 1 Encounters:  08/22/18 101.8 kg    Ideal Body Weight:  80.91 kg  BMI:  Body mass index is 30.45 kg/m.  Estimated Nutritional Needs:   Kcal:  4742-5956 (22-24 kcal/kg)  Protein:  115-125 grams   Fluid:  >/= 2.2 L/day     Jarome Matin, MS, RD, LDN, [facility] Inpatient Clinical Dietitian Pager # [phone] After hours/weekend pager # [phone]

## 2018-08-22 NOTE — Progress Notes (Signed)
PROGRESS NOTE    Alexander Fry  VQQ:[id number] DOB: [birth year] DOA: 08/06/2018 PCP: [name], PA-C Brief Narrative:57 y.o.male,w hx of metastatic renal cell carcinoma (bone, brain, lung), h/o R IJ thrombus, Iron deficiency anemia, w recent admission for abdominal pain where he was found to have duodenitis. Pt apparently went home and continued to have abdominal pain which was worse yesterday associated with some n/v. Pt denies fever, chills, diarrhea, brbpr, black stool. Pt has been taking protonix. Denies NSAIDS.   Interim history Admitted for acute pancreatitis. Gastroenterology consulted and appreciated. Continues to have abdominal pain and is requiring both IV as well as oral pain medications quite frequently.   Assessment & Plan:   Principal Problem:   Pancreatitis Active Problems:   Transaminitis   Abdominal pain   Nausea & vomiting   Hypokalemia   Diabetes mellitus type 2 in obese (HCC)   Internal jugular (IJ) vein thromboembolism, chronic, right (HCC)   Normocytic anemia   Duodenitis   Uncontrolled pain   Metastatic renal cell carcinoma ([place])   Full code status  #1 acute pancreatitis resolved. CT scan of the abdomen repeated shows findings questionable findings of bowel obstruction but patient has been passing gas and moving bowels and is using narcotics for pain. At this time there is no evidence of acute bowel obstruction. CT scan also revealed some evidence of duodenitis which was treated with Cipro and Flagyl. IV steroids changed to prednisone on September 12. Abnormal LFTs have been trending down . KUB done today shows worsening gaseous distention of small bowel consistent with increasing small bowel obstruction.  I will reconsult surgery today.  He has some increased leukocytosis today.  He has had 2 different CT scans during this hospital stay.  I will defer to surgery if they need to repeat a CT scan of the abdomen.  #2 metastatic renal  cell carcinoma-follow-up PET scan as an outpatient patient has no interest to see a urologist at this time. He will consider seeing a urologist as an outpatient. He does not want to have any kind of surgery at this time.  #3 right IJ thrombus on Xarelto continue. Per Dr. Marin Olp.  #4 lower extremity edema multifactorial advised him to keep his feet elevated when he is in bed. Ambulate as much as possible. Continue TED hose.  #5 hypokalemia rePLETE.      DVT prophylaxis: XARELTO Code Status:FULL Family Communication:NONE Disposition Plan: TBD  Consultants: Oncology, palliative care  Procedures: MRCP Antimicrobials:  None Subjective:  Patient reports having increasing abdominal fullness no nausea vomiting but has not had a BM or moving gas. Objective: Vitals:   08/21/18 1252 08/21/18 2032 08/22/18 0457 08/22/18 0500  BP: (!) 135/92 117/84 121/75   Pulse: (!) 101 75 80   Resp:  18 16   Temp: 98.3 F (36.8 C) 98.1 F (36.7 C) 98.2 F (36.8 C)   TempSrc:  Oral Oral   SpO2: 100% 97% 97%   Weight:    101.8 kg  Height:        Intake/Output Summary (Last 24 hours) at 08/22/2018 1129 Last data filed at 08/22/2018 0741 Gross per 24 hour  Intake 180 ml  Output 1775 ml  Net -1595 ml   Filed Weights   08/20/18 0555 08/21/18 0802 08/22/18 0500  Weight: 99.8 kg 101.3 kg 101.8 kg    Examination:  General exam: Appears In mild distress Respiratory system: Clear to auscultation. Respiratory effort normal. Cardiovascular system: S1 & S2 heard, RRR. No  JVD, murmurs, rubs, gallops or clicks. No pedal edema. Gastrointestinal system: Abdomen DISTENDED soft and  tender. No organomegaly or masses felt. BOWEL SOUNDS DIMINISHED Central nervous system: Alert and oriented. No focal neurological deficits. Extremities:3 PLUS EDEMA Skin: No rashes, lesions or ulcers    Data Reviewed: I have personally reviewed following labs and imaging studies  CBC: Recent Labs  Lab  08/18/18 0431 08/19/18 0329 08/20/18 0328 08/21/18 0359 08/22/18 0320  WBC 13.4* 14.1* 9.9 12.8* 14.7*  NEUTROABS  --   --   --   --  11.7*  HGB 10.2* 10.7* 9.2* 9.6* 10.2*  HCT 32.8* 34.1* 29.4* 30.5* 32.3*  MCV 81.6 81.0 81.7 81.8 82.0  PLT 411* 425* 329 372 856*   Basic Metabolic Panel: Recent Labs  Lab 08/16/18 0556  08/18/18 0431 08/19/18 0329 08/20/18 0328 08/21/18 0711 08/22/18 0320  NA 133*   < > 137 139 138 136 137  K 3.6   < > 4.0 3.5 3.4* 3.4* 3.8  CL 97*   < > 99 100 101 98 99  CO2 24   < > 28 29 29 28 28   GLUCOSE 158*   < > 142* 117* 107* 121* 107*  BUN 10   < > 14 14 15 15 12   CREATININE 0.52*   < > 0.60* 0.59* 0.73 0.82 0.77  CALCIUM 8.9   < > 8.8* 9.1 8.5* 8.9 8.6*  MG 1.9  --   --   --   --   --  2.1   < > = values in this interval not displayed.   GFR: Estimated Creatinine Clearance: 124.3 mL/min (by C-G formula based on SCr of 0.77 mg/dL). Liver Function Tests: Recent Labs  Lab 08/18/18 0431 08/19/18 0329 08/20/18 0328 08/21/18 0711 08/22/18 0320  AST 34 56* 44* 65* 53*  ALT 54* 67* 58* 78* 70*  ALKPHOS 778* 870* 671* 937* 878*  BILITOT 5.3* 5.4* 4.5* 4.5* 3.5*  PROT 5.9* 5.9* 5.2* 5.9* 5.8*  ALBUMIN 2.3* 2.6* 2.2* 2.6* 2.3*   No results for input(s): LIPASE, AMYLASE in the last 168 hours. No results for input(s): AMMONIA in the last 168 hours. Coagulation Profile: Recent Labs  Lab 08/16/18 1033  INR 1.25   Cardiac Enzymes: No results for input(s): CKTOTAL, CKMB, CKMBINDEX, TROPONINI in the last 168 hours. BNP (last 3 results) No results for input(s): PROBNP in the last 8760 hours. HbA1C: No results for input(s): HGBA1C in the last 72 hours. CBG: Recent Labs  Lab 08/21/18 1632 08/21/18 2053 08/21/18 2352 08/22/18 0459 08/22/18 0739  GLUCAP 96 124* 87 89 104*   Lipid Profile: No results for input(s): CHOL, HDL, LDLCALC, TRIG, CHOLHDL, LDLDIRECT in the last 72 hours. Thyroid Function Tests: No results for input(s): TSH,  T4TOTAL, FREET4, T3FREE, THYROIDAB in the last 72 hours. Anemia Panel: No results for input(s): VITAMINB12, FOLATE, FERRITIN, TIBC, IRON, RETICCTPCT in the last 72 hours. Sepsis Labs: No results for input(s): PROCALCITON, LATICACIDVEN in the last 168 hours.  No results found for this or any previous visit (from the past 240 hour(s)).       Radiology Studies: Dg Abd 1 View  Result Date: 08/22/2018 CLINICAL DATA:  Abdominal distention, history of renal cell carcinoma EXAM: ABDOMEN - 1 VIEW COMPARISON:  Abdomen films of 08/21/2017 and CT abdomen pelvis of 08/17/2018 FINDINGS: There is slightly more gaseous distention of small bowel loops consistent with small bowel obstruction. Very little colonic bowel gas is noted. No opaque calculi are seen.  There are degenerative changes in the lower lumbar spine. IMPRESSION: Worsening gaseous distention of small bowel consistent with increasing small bowel obstruction. Electronically Signed   By: Ivar Drape M.D.   On: 08/22/2018 09:25   Dg Abd 1 View  Result Date: 08/21/2018 CLINICAL DATA:  Abdominal pain and nausea EXAM: ABDOMEN - 1 VIEW COMPARISON:  08/18/2018 FINDINGS: Scattered large and small bowel gas is noted. Persistent small bowel dilatation is noted similar to that seen on the prior exam consistent with partial small bowel obstruction. No acute bony abnormality is noted. No other focal abnormality is seen. IMPRESSION: Persistent changes of small-bowel obstruction. Electronically Signed   By: Inez Catalina M.D.   On: 08/21/2018 14:41        Scheduled Meds: . dexamethasone  8 mg Intravenous Q12H  . docusate sodium  100 mg Oral BID  . fentaNYL  100 mcg Transdermal Q72H  . insulin aspart  0-9 Units Subcutaneous Q4H  . naloxegol oxalate  25 mg Oral Daily  . ondansetron (ZOFRAN) IV  4 mg Intravenous Q8H  . pantoprazole  40 mg Oral BID  . rivaroxaban  10 mg Oral Daily  . sucralfate  1 g Oral TID WC & HS   Continuous Infusions: . sodium  chloride 250 mL (08/21/18 1430)  . dextrose 5 % and 0.9% NaCl 10 mL/hr at 08/14/18 1957     LOS: 15 days     Georgette Shell, MD [facility] If 7PM-7AM, please contact night-coverage [email or web address] Password [facility] 08/22/2018, 11:29 AM

## 2018-08-23 ENCOUNTER — Inpatient Hospital Stay (HOSPITAL_COMMUNITY): Payer: BLUE CROSS/BLUE SHIELD

## 2018-08-23 LAB — GLUCOSE, CAPILLARY
GLUCOSE-CAPILLARY: 105 mg/dL — AB (ref 70–99)
Glucose-Capillary: 109 mg/dL — ABNORMAL HIGH (ref 70–99)
Glucose-Capillary: 102 mg/dL — ABNORMAL HIGH (ref 70–99)
Glucose-Capillary: 120 mg/dL — ABNORMAL HIGH (ref 70–99)
Glucose-Capillary: 115 mg/dL — ABNORMAL HIGH (ref 70–99)

## 2018-08-23 LAB — COMPREHENSIVE METABOLIC PANEL
ALBUMIN: 2.1 g/dL — AB (ref 3.5–5.0)
ALT: 48 U/L — ABNORMAL HIGH (ref 0–44)
ANION GAP: 10 (ref 5–15)
AST: 30 U/L (ref 15–41)
Alkaline Phosphatase: 660 U/L — ABNORMAL HIGH (ref 38–126)
BILIRUBIN TOTAL: 3.2 mg/dL — AB (ref 0.3–1.2)
BUN: 11 mg/dL (ref 6–20)
CHLORIDE: 101 mmol/L (ref 98–111)
CO2: 28 mmol/L (ref 22–32)
Calcium: 8.4 mg/dL — ABNORMAL LOW (ref 8.9–10.3)
Creatinine, Ser: 0.67 mg/dL (ref 0.61–1.24)
GFR calc Af Amer: >60 mL/min (ref >60)
GFR calc non Af Amer: >60 mL/min (ref >60)
GLUCOSE: 115 mg/dL — AB (ref 70–99)
POTASSIUM: 4.1 mmol/L (ref 3.5–5.1)
SODIUM: 139 mmol/L (ref 135–145)
Total Protein: 5.1 g/dL — ABNORMAL LOW (ref 6.5–8.1)

## 2018-08-23 LAB — CBC WITH DIFFERENTIAL/PLATELET
BASOS ABS: 0 10*3/uL (ref 0.0–0.1)
BASOS PCT: 0 %
EOS PCT: 0 %
Eosinophils Absolute: 0 10*3/uL (ref 0.0–0.7)
HEMATOCRIT: 29.3 % — AB (ref 39.0–52.0)
HEMOGLOBIN: 9.1 g/dL — AB (ref 13.0–17.0)
Lymphocytes Relative: 8 %
Lymphs Abs: 0.8 10*3/uL (ref 0.7–4.0)
MCH: 25.8 pg — ABNORMAL LOW (ref 26.0–34.0)
MCHC: 31.1 g/dL (ref 30.0–36.0)
MCV: 83 fL (ref 78.0–100.0)
MONOS PCT: 8 %
Monocytes Absolute: 0.8 10*3/uL (ref 0.1–1.0)
NEUTROS ABS: 9 10*3/uL — AB (ref 1.7–7.7)
Neutrophils Relative %: 84 %
Platelets: 358 10*3/uL (ref 150–400)
RBC: 3.53 MIL/uL — ABNORMAL LOW (ref 4.22–5.81)
RDW: 21.5 % — AB (ref 11.5–15.5)
WBC: 10.6 10*3/uL — ABNORMAL HIGH (ref 4.0–10.5)

## 2018-08-23 MED ORDER — BISACODYL 5 MG PO TBEC
5.0000 mg | DELAYED_RELEASE_TABLET | Freq: Every day | ORAL | Status: DC
  Administered 2018-08-24 – 2018-08-28 (×5): 5 mg via ORAL
  Filled 2018-08-23 (×5): qty 1

## 2018-08-23 MED ORDER — JUVEN PO PACK
1.0000 | PACK | Freq: Two times a day (BID) | ORAL | Status: DC
  Administered 2018-08-23 – 2018-08-24 (×3): 1 via ORAL
  Filled 2018-08-23 (×6): qty 1

## 2018-08-23 NOTE — Progress Notes (Signed)
Patient ID: Alexander Fry, male   DOB: Apr 17, 1961, 57 y.o.   MRN: [id number]       Subjective: Pt states he feels less bloated today and feels better.  Had some nausea last night, but had not taken in any liquids.  Feels better this morning and drank some veggie broth.  Does not like resource breeze and will not drink it.  Objective: Vital signs in last 24 hours: Temp:  [98 F (36.7 C)-98.4 F (36.9 C)] 98 F (36.7 C) (09/18 0439) Pulse Rate:  [70-80] 70 (09/18 0439) Resp:  [17-18] 18 (09/18 0439) BP: (115-129)/(70-81) 119/70 (09/18 0439) SpO2:  [96 %-98 %] 96 % (09/18 0816) Weight:  [101 kg] 101 kg (09/18 0439) Last BM Date: 08/22/18  Intake/Output from previous day: 09/17 0701 - 09/18 0700 In: 1484.2 [P.O.:120; I.V.:1364.2] Out: 975 [Urine:975] Intake/Output this shift: No intake/output data recorded.  PE: Heart: regular Lungs: CTAB Abd: soft, minimally tender, some distention, some BS present.  Lab Results:  Recent Labs    08/22/18 0320 08/23/18 0348  WBC 14.7* 10.6*  HGB 10.2* 9.1*  HCT 32.3* 29.3*  PLT 429* 358   BMET Recent Labs    08/22/18 0320 08/23/18 0348  NA 137 139  K 3.8 4.1  CL 99 101  CO2 28 28  GLUCOSE 107* 115*  BUN 12 11  CREATININE 0.77 0.67  CALCIUM 8.6* 8.4*   PT/INR No results for input(s): LABPROT, INR in the last 72 hours. CMP     Component Value Date/Time   NA 139 08/23/2018 0348   NA 147 (H) 11/11/2017 1337   NA 141 10/13/2017 1118   K 4.1 08/23/2018 0348   K 3.9 11/11/2017 1337   K 3.6 10/13/2017 1118   CL 101 08/23/2018 0348   CL 107 11/11/2017 1337   CO2 28 08/23/2018 0348   CO2 28 11/11/2017 1337   CO2 25 10/13/2017 1118   GLUCOSE 115 (H) 08/23/2018 0348   GLUCOSE 107 11/11/2017 1337   BUN 11 08/23/2018 0348   BUN 12 11/11/2017 1337   BUN 9.1 10/13/2017 1118   CREATININE 0.67 08/23/2018 0348   CREATININE 0.60 07/31/2018 0916   CREATININE 0.9 11/11/2017 1337   CREATININE 0.7 10/13/2017 1118   CALCIUM 8.4 (L)  08/23/2018 0348   CALCIUM 9.2 11/11/2017 1337   CALCIUM 9.2 10/13/2017 1118   PROT 5.1 (L) 08/23/2018 0348   PROT 6.6 11/11/2017 1337   PROT 7.4 10/13/2017 1118   ALBUMIN 2.1 (L) 08/23/2018 0348   ALBUMIN 3.3 11/11/2017 1337   ALBUMIN 3.9 10/13/2017 1118   AST 30 08/23/2018 0348   AST 185 (HH) 07/31/2018 0916   AST 38 (H) 10/13/2017 1118   ALT 48 (H) 08/23/2018 0348   ALT 151 (H) 07/31/2018 0916   ALT 38 11/11/2017 1337   ALT 34 10/13/2017 1118   ALKPHOS 660 (H) 08/23/2018 0348   ALKPHOS 166 (H) 11/11/2017 1337   ALKPHOS 181 (H) 10/13/2017 1118   BILITOT 3.2 (H) 08/23/2018 0348   BILITOT 4.1 (HH) 07/31/2018 0916   BILITOT 1.02 10/13/2017 1118   GFRNONAA >60 08/23/2018 0348   GFRNONAA >60 12/21/2017 1124   GFRAA >60 08/23/2018 0348   GFRAA >60 12/21/2017 1124   Lipase     Component Value Date/Time   LIPASE 80 (H) 08/15/2018 0836       Studies/Results: Dg Abd 1 View  Result Date: 08/22/2018 CLINICAL DATA:  Abdominal distention, history of renal cell carcinoma EXAM: ABDOMEN - 1 VIEW  COMPARISON:  Abdomen films of 08/21/2017 and CT abdomen pelvis of 08/17/2018 FINDINGS: There is slightly more gaseous distention of small bowel loops consistent with small bowel obstruction. Very little colonic bowel gas is noted. No opaque calculi are seen. There are degenerative changes in the lower lumbar spine. IMPRESSION: Worsening gaseous distention of small bowel consistent with increasing small bowel obstruction. Electronically Signed   By: Ivar Drape M.D.   On: 08/22/2018 09:25   Dg Abd 1 View  Result Date: 08/21/2018 CLINICAL DATA:  Abdominal pain and nausea EXAM: ABDOMEN - 1 VIEW COMPARISON:  08/18/2018 FINDINGS: Scattered large and small bowel gas is noted. Persistent small bowel dilatation is noted similar to that seen on the prior exam consistent with partial small bowel obstruction. No acute bony abnormality is noted. No other focal abnormality is seen. IMPRESSION: Persistent  changes of small-bowel obstruction. Electronically Signed   By: Inez Catalina M.D.   On: 08/21/2018 14:41   [name] Ascites (abdomen Limited)  Result Date: 08/22/2018 CLINICAL DATA:  Ascites. EXAM: LIMITED ABDOMEN ULTRASOUND FOR ASCITES TECHNIQUE: Limited ultrasound survey for ascites was performed in all four abdominal quadrants. COMPARISON:  CT 08/17/2018. FINDINGS: Mild amount of ascites demonstrated. IMPRESSION: Mild amount of ascites. Electronically Signed   By: Marcello Moores  Register   On: 08/22/2018 14:50    Anti-infectives: Anti-infectives (From admission, onward)   Start     Dose/Rate Route Frequency Ordered Stop   08/07/18 1800  ciprofloxacin (CIPRO) IVPB 400 mg  Status:  Discontinued     400 mg 200 mL/hr over 60 Minutes Intravenous Every 12 hours 08/07/18 0321 08/09/18 1514   08/07/18 0300  metroNIDAZOLE (FLAGYL) IVPB 500 mg  Status:  Discontinued     500 mg 100 mL/hr over 60 Minutes Intravenous Every 8 hours 08/07/18 0253 08/09/18 1514   08/07/18 0300  ciprofloxacin (CIPRO) IVPB 400 mg     400 mg 200 mL/hr over 60 Minutes Intravenous  Once 08/07/18 0258 08/07/18 0415       Assessment/Plan Metastatic renal call carcinoma Diabetes mellitus Normocytic anemia Acute pancreatitis - visible on MRI, improving, GI has signed off  Abdominal pain/distention, pSBO vs. Ileus -patient appears to have more of an ileus type pattern.  Films this morning still show some sb dilatation, but he also has air in his colon.  He is passing flatus and had 2 more BMs yesterday after the first 2 he had after the suppository. -continue clear liquids today since he really didn't take any yesterday.  Treat clinically at this time. -doesn't like resource breeze.  Will DC and add Juven. -no surgical indications FEN - clear liquids, Juven VTE - Xarelto ID - none   LOS: 16 days    Henreitta Cea , [facility] Surgery 08/23/2018, 8:31 AM Pager: [phone]

## 2018-08-23 NOTE — Progress Notes (Signed)
Palliative:  I met again this morning with Alexander Fry. He is a little sleepy but arouses and shares that he feels well this morning. Pain appears to be managed well and he received most relief after BM and after utilizing simethicone. No role for increasing opioids at this time. He will need continued aggressive bowel management given his partial obstruction this admission and also now on high dose chronic opioid use. Dulcolax 5 mg po daily added to prevent further constipation. No further questions/concerns. Emotional support provided.   Exam: Awake, alert, oriented. No distress. Abd less taut.   15 min  Vinie Sill, NP Palliative Medicine Team Pager # [phone] (M-F 8a-5p) Team Phone # [phone] (Nights/Weekends)

## 2018-08-23 NOTE — Progress Notes (Signed)
PROGRESS NOTE [facility]   Alexander Fry   [id number]:[id number] DOB: March 25, 1961  DOA: 08/06/2018 PCP: [name], PA-C   Brief Narrative:  Alexander Fry is a 57 year old male with medical history of metastatic renal cell carcinoma (bone, brain, lung) iron deficiency anemia who presented to the emergency department complaining of abdominal pain.  Was found to have acute pancreatitis.  Gastroenterology was consulted and recommended conservative management.  Despite treatment patient continued to have significant pain requiring IV pain medication therefore further images, CT of the abdomen was repeated on 9/12 which showed duodenitis.  He was started on empiric antibiotic.  Despite treatment patient continued to have significant abdominal pain and KUB was ordered were performed revealing small bowel obstruction.  Surgery was consulted and recommended conservative management.  Subjective: Patient seen and examined, passing gas and having bowel movement.  Patient reported that bloating has improved.  Has not taken any significant amount of liquid.  "I will try later ".  Denies nausea and vomiting.  Assessment & Plan: Acute pancreatitis/duodenitis Patient was treated with Cipro and Flagyl.  This patient seems to have resolved.  Small bowel obstruction Likely related to chronic opiate use and poor mobility.  General surgery was consulted recommended conservative management.  Patient passed gas and had bowel movement last night.  Improving.  Advance diet per surgical team.  Encourage ambulation.  Abdominal x-ray shows improvement of SBO.  Encourage oral hydration.  Can repeat suppository today. Check KUB in AM.   Metastatic renal cell carcinoma Continue management per oncology.  Right IJ thrombus Continue Xarelto  Lower extremity edema (chronic), hypoalbuminemia  Multifactorial, will try some lasix today   DVT prophylaxis: Xarelto  Code Status: Full  Family Communication: None    Disposition Plan: Home 1-2 days pending surgical clearance   Consultants:   Oncology   Palliative Care   Procedures:   None   Antimicrobials:   None   Objective: Vitals:   08/22/18 1955 08/23/18 0439 08/23/18 0816 08/23/18 1223  BP: 129/79 119/70  133/83  Pulse: 80 70  86  Resp: 18 18    Temp: 98.4 F (36.9 C) 98 F (36.7 C)  98.1 F (36.7 C)  TempSrc: Oral Oral  Oral  SpO2: 97% 98% 96% 98%  Weight:  101 kg    Height:        Intake/Output Summary (Last 24 hours) at 08/23/2018 1612 Last data filed at 08/23/2018 4742 Gross per 24 hour  Intake 1310.89 ml  Output 875 ml  Net 435.89 ml   Filed Weights   08/21/18 0802 08/22/18 0500 08/23/18 0439  Weight: 101.3 kg 101.8 kg 101 kg    Examination:  General exam: Appears calm and comfortable  HEENT: OP moist and clear Respiratory system: Clear to auscultation. No wheezes,crackle or rhonchi Cardiovascular system: S1 & S2 heard, RRR. No JVD, murmurs, rubs or gallops Gastrointestinal system: Abdomen mild distended, soft non tender. + BS  Central nervous system: Alert and oriented. No focal neurological deficits. Extremities: 2+ pitting edema in feet b/l  Skin: No rashes Psychiatry: Mood & affect appropriate.    Data Reviewed: I have personally reviewed following labs and imaging studies  CBC: Recent Labs  Lab 08/19/18 0329 08/20/18 0328 08/21/18 0359 08/22/18 0320 08/23/18 0348  WBC 14.1* 9.9 12.8* 14.7* 10.6*  NEUTROABS  --   --   --  11.7* 9.0*  HGB 10.7* 9.2* 9.6* 10.2* 9.1*  HCT 34.1* 29.4* 30.5* 32.3* 29.3*  MCV 81.0 81.7 81.8 82.0  83.0  PLT 425* 329 372 429* 737   Basic Metabolic Panel: Recent Labs  Lab 08/19/18 0329 08/20/18 0328 08/21/18 0711 08/22/18 0320 08/23/18 0348  NA 139 138 136 137 139  K 3.5 3.4* 3.4* 3.8 4.1  CL 100 101 98 99 101  CO2 29 29 28 28 28   GLUCOSE 117* 107* 121* 107* 115*  BUN 14 15 15 12 11   CREATININE 0.59* 0.73 0.82 0.77 0.67  CALCIUM 9.1 8.5* 8.9 8.6* 8.4*   MG  --   --   --  2.1  --    GFR: Estimated Creatinine Clearance: 123.9 mL/min (by C-G formula based on SCr of 0.67 mg/dL). Liver Function Tests: Recent Labs  Lab 08/19/18 0329 08/20/18 0328 08/21/18 0711 08/22/18 0320 08/23/18 0348  AST 56* 44* 65* 53* 30  ALT 67* 58* 78* 70* 48*  ALKPHOS 870* 671* 937* 878* 660*  BILITOT 5.4* 4.5* 4.5* 3.5* 3.2*  PROT 5.9* 5.2* 5.9* 5.8* 5.1*  ALBUMIN 2.6* 2.2* 2.6* 2.3* 2.1*   No results for input(s): LIPASE, AMYLASE in the last 168 hours. No results for input(s): AMMONIA in the last 168 hours. Coagulation Profile: No results for input(s): INR, PROTIME in the last 168 hours. Cardiac Enzymes: No results for input(s): CKTOTAL, CKMB, CKMBINDEX, TROPONINI in the last 168 hours. BNP (last 3 results) No results for input(s): PROBNP in the last 8760 hours. HbA1C: No results for input(s): HGBA1C in the last 72 hours. CBG: Recent Labs  Lab 08/22/18 1954 08/23/18 0012 08/23/18 0437 08/23/18 0718 08/23/18 1150  GLUCAP 85 105* 109* 102* 120*   Lipid Profile: No results for input(s): CHOL, HDL, LDLCALC, TRIG, CHOLHDL, LDLDIRECT in the last 72 hours. Thyroid Function Tests: No results for input(s): TSH, T4TOTAL, FREET4, T3FREE, THYROIDAB in the last 72 hours. Anemia Panel: No results for input(s): VITAMINB12, FOLATE, FERRITIN, TIBC, IRON, RETICCTPCT in the last 72 hours. Sepsis Labs: No results for input(s): PROCALCITON, LATICACIDVEN in the last 168 hours.  No results found for this or any previous visit (from the past 240 hour(s)).    Radiology Studies: Dg Abd 1 View  Result Date: 08/23/2018 CLINICAL DATA:  Small bowel obstruction. EXAM: ABDOMEN - 1 VIEW COMPARISON:  Radiographs of August 22, 2018. FINDINGS: Mildly improved small bowel dilatation is noted concerning for ileus or distal small bowel obstruction. No definite colonic dilatation is noted. No abnormal calcifications are noted. IMPRESSION: Mildly improved small bowel  dilatation is noted suggesting improving ileus or distal small bowel obstruction. Continued radiographic follow-up is recommended. Electronically Signed   By: Marijo Conception, M.D.   On: 08/23/2018 10:45   Dg Abd 1 View  Result Date: 08/22/2018 CLINICAL DATA:  Abdominal distention, history of renal cell carcinoma EXAM: ABDOMEN - 1 VIEW COMPARISON:  Abdomen films of 08/21/2017 and CT abdomen pelvis of 08/17/2018 FINDINGS: There is slightly more gaseous distention of small bowel loops consistent with small bowel obstruction. Very little colonic bowel gas is noted. No opaque calculi are seen. There are degenerative changes in the lower lumbar spine. IMPRESSION: Worsening gaseous distention of small bowel consistent with increasing small bowel obstruction. Electronically Signed   By: Ivar Drape M.D.   On: 08/22/2018 09:25   [name] Ascites (abdomen Limited)  Result Date: 08/22/2018 CLINICAL DATA:  Ascites. EXAM: LIMITED ABDOMEN ULTRASOUND FOR ASCITES TECHNIQUE: Limited ultrasound survey for ascites was performed in all four abdominal quadrants. COMPARISON:  CT 08/17/2018. FINDINGS: Mild amount of ascites demonstrated. IMPRESSION: Mild amount of ascites. Electronically Signed  By: [place]   On: 08/22/2018 14:50      Scheduled Meds: . Derrill Memo ON 08/24/2018] bisacodyl  5 mg Oral Daily  . dexamethasone  8 mg Intravenous Q12H  . docusate sodium  100 mg Oral BID  . fentaNYL  100 mcg Transdermal Q72H  . insulin aspart  0-9 Units Subcutaneous Q4H  . naloxegol oxalate  25 mg Oral Daily  . nutrition supplement (JUVEN)  1 packet Oral BID BM  . ondansetron (ZOFRAN) IV  4 mg Intravenous Q8H  . pantoprazole  40 mg Oral BID  . rivaroxaban  10 mg Oral Daily  . sucralfate  1 g Oral TID WC & HS   Continuous Infusions: . sodium chloride 250 mL (08/21/18 1430)  . sodium chloride 75 mL/hr at 08/23/18 1346  . dextrose 5 % and 0.9% NaCl 10 mL/hr at 08/14/18 1957     LOS: 16 days    Time spent: Total of  25 minutes spent with pt, greater than 50% of which was spent in discussion of  treatment, counseling and coordination of care   Chipper Oman, MD Pager: Text Page via [email or web address]   If 7PM-7AM, please contact night-coverage [email or web address] 08/23/2018, 4:12 PM   Note - This record has been created using [employer]. Chart creation errors have been sought, but may not always have been located. Such creation errors do not reflect on the standard of medical care.

## 2018-08-23 NOTE — Progress Notes (Signed)
Alexander Fry is feeling better.  He finally has some bowel movements yesterday.  He was seen by surgery.  As always, the input is very appreciated.  He says he is more comfortable.  He says he felt a whole lot better after he passed a lot of gas yesterday.  We will repeat a abdominal film to see if this bowel obstruction is improved.  His bilirubin is down to 3.2.  Alkaline phosphatase is 660.  Creatinine 0.67.  His white cell count is 10.6.  Hemoglobin 9.1.  Platelet count 358,000.  The MCV is down a little bit.  Will recheck his iron studies see how they look.  His abdomen is not as distended.  This is certainly encouraging.  Hopefully, this blockage has resolved.  He says suppositories seem to help him yesterday with his bowel movements.  His potassium is doing okay.  He has walking.  Hopefully, we will start to see the "light at the end of the tunnel."  He has had no cough.  His vital signs all look stable.  His temperature is 98.  Pulse 70.  Blood pressure 119/70.  Head neck exam shows no ocular or oral lesions.  He has no palpable adenopathy in the neck.  Lungs are clear.  He has good breath sounds bilaterally.  Cardiac exam regular rate and rhythm with no murmurs, rubs or bruits.  Abdomen is slightly distended. He has decreased bowel sounds, but are present.  There is no obvious abdominal mass.  Extremities shows the chronic mild edema in his legs.  Hopefully, things will now get better for Alexander Fry.  I just want to feel better.  We will see what the abdominal KUB shows.  Lattie Haw, MD  [place] 11:1

## 2018-08-24 ENCOUNTER — Inpatient Hospital Stay: Payer: BLUE CROSS/BLUE SHIELD | Admitting: Hematology & Oncology

## 2018-08-24 ENCOUNTER — Inpatient Hospital Stay: Payer: BLUE CROSS/BLUE SHIELD

## 2018-08-24 ENCOUNTER — Inpatient Hospital Stay (HOSPITAL_COMMUNITY): Payer: BLUE CROSS/BLUE SHIELD

## 2018-08-24 LAB — COMPREHENSIVE METABOLIC PANEL
ALBUMIN: 2 g/dL — AB (ref 3.5–5.0)
ALT: 39 U/L (ref 0–44)
ANION GAP: 8 (ref 5–15)
AST: 23 U/L (ref 15–41)
Alkaline Phosphatase: 571 U/L — ABNORMAL HIGH (ref 38–126)
BUN: 16 mg/dL (ref 6–20)
CALCIUM: 8.4 mg/dL — AB (ref 8.9–10.3)
CHLORIDE: 100 mmol/L (ref 98–111)
CO2: 29 mmol/L (ref 22–32)
CREATININE: 0.8 mg/dL (ref 0.61–1.24)
GFR calc Af Amer: >60 mL/min (ref >60)
GFR calc non Af Amer: >60 mL/min (ref >60)
GLUCOSE: 132 mg/dL — AB (ref 70–99)
POTASSIUM: 4.1 mmol/L (ref 3.5–5.1)
SODIUM: 137 mmol/L (ref 135–145)
Total Bilirubin: 2.6 mg/dL — ABNORMAL HIGH (ref 0.3–1.2)
Total Protein: 5.2 g/dL — ABNORMAL LOW (ref 6.5–8.1)

## 2018-08-24 LAB — CBC WITH DIFFERENTIAL/PLATELET
BASOS PCT: 0 %
Basophils Absolute: 0 10*3/uL (ref 0.0–0.1)
EOS PCT: 0 %
Eosinophils Absolute: 0 10*3/uL (ref 0.0–0.7)
HCT: 29.1 % — ABNORMAL LOW (ref 39.0–52.0)
HEMOGLOBIN: 9.2 g/dL — AB (ref 13.0–17.0)
LYMPHS PCT: 6 %
Lymphs Abs: 0.6 10*3/uL — ABNORMAL LOW (ref 0.7–4.0)
MCH: 26.2 pg (ref 26.0–34.0)
MCHC: 31.6 g/dL (ref 30.0–36.0)
MCV: 82.9 fL (ref 78.0–100.0)
MONO ABS: 0.9 10*3/uL (ref 0.1–1.0)
Monocytes Relative: 9 %
NEUTROS PCT: 85 %
Neutro Abs: 8.6 10*3/uL — ABNORMAL HIGH (ref 1.7–7.7)
Platelets: 364 10*3/uL (ref 150–400)
RBC: 3.51 MIL/uL — ABNORMAL LOW (ref 4.22–5.81)
RDW: 21.4 % — ABNORMAL HIGH (ref 11.5–15.5)
WBC: 10.1 10*3/uL (ref 4.0–10.5)

## 2018-08-24 LAB — GLUCOSE, CAPILLARY
GLUCOSE-CAPILLARY: 129 mg/dL — AB (ref 70–99)
GLUCOSE-CAPILLARY: 104 mg/dL — AB (ref 70–99)
GLUCOSE-CAPILLARY: 105 mg/dL — AB (ref 70–99)
GLUCOSE-CAPILLARY: 117 mg/dL — AB (ref 70–99)
Glucose-Capillary: 109 mg/dL — ABNORMAL HIGH (ref 70–99)
Glucose-Capillary: 119 mg/dL — ABNORMAL HIGH (ref 70–99)
Glucose-Capillary: 142 mg/dL — ABNORMAL HIGH (ref 70–99)

## 2018-08-24 LAB — IRON AND TIBC
Iron: 31 ug/dL — ABNORMAL LOW (ref 45–182)
SATURATION RATIOS: 17 % — AB (ref 17.9–39.5)
TIBC: 177 ug/dL — AB (ref 250–450)
UIBC: 146 ug/dL

## 2018-08-24 LAB — FERRITIN: FERRITIN: 3017 ng/mL — AB (ref 24–336)

## 2018-08-24 LAB — MAGNESIUM: Magnesium: 2 mg/dL (ref 1.7–2.4)

## 2018-08-24 MED ORDER — FUROSEMIDE 10 MG/ML IJ SOLN
20.0000 mg | Freq: Once | INTRAMUSCULAR | Status: AC
  Administered 2018-08-24: 20 mg via INTRAVENOUS
  Filled 2018-08-24: qty 2

## 2018-08-24 MED ORDER — SUCRALFATE 1 GM/10ML PO SUSP
1.0000 g | Freq: Three times a day (TID) | ORAL | Status: DC
  Administered 2018-08-24 – 2018-08-28 (×16): 1 g via ORAL
  Filled 2018-08-24 (×16): qty 10

## 2018-08-24 MED ORDER — DEXAMETHASONE 4 MG PO TABS
4.0000 mg | ORAL_TABLET | Freq: Two times a day (BID) | ORAL | Status: DC
  Administered 2018-08-24 – 2018-08-27 (×7): 4 mg via ORAL
  Filled 2018-08-24 (×7): qty 1

## 2018-08-24 MED ORDER — ACETAMINOPHEN 500 MG PO TABS
1000.0000 mg | ORAL_TABLET | Freq: Four times a day (QID) | ORAL | Status: DC | PRN
  Administered 2018-08-25 – 2018-08-26 (×2): 1000 mg via ORAL
  Filled 2018-08-24 (×2): qty 2

## 2018-08-24 MED ORDER — KETOROLAC TROMETHAMINE 30 MG/ML IJ SOLN
30.0000 mg | Freq: Once | INTRAMUSCULAR | Status: AC
  Administered 2018-08-24: 30 mg via INTRAVENOUS
  Filled 2018-08-24: qty 1

## 2018-08-24 MED ORDER — ONDANSETRON HCL 4 MG/2ML IJ SOLN
4.0000 mg | Freq: Three times a day (TID) | INTRAMUSCULAR | Status: DC | PRN
  Administered 2018-08-24 – 2018-08-26 (×3): 4 mg via INTRAVENOUS
  Filled 2018-08-24 (×3): qty 2

## 2018-08-24 NOTE — Progress Notes (Signed)
PROGRESS NOTE [facility]   Alexander Fry   ZSW:[id number] DOB: Mar 06, 1961  DOA: 08/06/2018 PCP: Alexander Dredge, PA-C   Brief Narrative:  Alexander Fry is a 57 year old male with medical history of metastatic renal cell carcinoma (bone, brain, lung) iron deficiency anemia who presented to the emergency department complaining of abdominal pain.  Was found to have acute pancreatitis.  Gastroenterology was consulted and recommended conservative management.  Despite treatment patient continued to have significant pain requiring IV pain medication therefore further images, CT of the abdomen was repeated on 9/12 which showed duodenitis.  He was started on empiric antibiotic.  Despite treatment patient continued to have significant abdominal pain and KUB was ordered were performed revealing small bowel obstruction.  Surgery was consulted and recommended conservative management.  Subjective: Patient seen and examined, continues to have BM and passing gas. Abdominal pain has improved. Tolerated liquid diet yesterday. Feels less bloated. Remains afebrile.   Assessment & Plan: Acute pancreatitis/duodenitis Patient completed abx therapy with Cipro and Flagyl.  This patient seems to have resolved.  Small bowel obstruction/Ileus - clinically improving  Likely related to chronic opiate use and poor mobility.  General surgery was consulted recommended conservative management.  Abdominal x-ray shows ileus type pattern rather than obstruction, clinically SOB resolving.  Tolerating clear liquid diet.  Abdominal pain has improved.  Continue to encourage oral hydration and ambulation.  Advance diet to full liquid today and if tolerated okay to advance to soft in a.m.  No need for further images at this point.  General surgery recommendations appreciated.  Keep magnesium above 2 and potassium above 4.  Continue pain management this has been changed to oral medication.  Discontinue IV Dilaudid.  Metastatic  renal cell carcinoma Continue management per oncology.  Right IJ thrombus Continue Xarelto  Lower extremity edema (chronic), hypoalbuminemia  Multifactorial, did not receive Lasix yesterday.  Will give 1 dose today.  Leg elevation.  DVT prophylaxis: Xarelto  Code Status: Full  Family Communication: None  Disposition Plan: If tolerating diet home hopefully in a.m.  Consultants:   Oncology   Palliative Care   Procedures:   None   Antimicrobials:   None   Objective: Vitals:   08/23/18 1223 08/23/18 2112 08/24/18 0450 08/24/18 0538  BP: 133/83 119/73 137/87   Pulse: 86 76 81   Resp:  16 12   Temp: 98.1 F (36.7 C) 98.7 F (37.1 C) 97.6 F (36.4 C)   TempSrc: Oral Oral Oral   SpO2: 98% 98% 98%   Weight:    102.4 kg  Height:        Intake/Output Summary (Last 24 hours) at 08/24/2018 1127 Last data filed at 08/24/2018 0740 Gross per 24 hour  Intake 2070 ml  Output 500 ml  Net 1570 ml   Filed Weights   08/22/18 0500 08/23/18 0439 08/24/18 0538  Weight: 101.8 kg 101 kg 102.4 kg    Examination:  General: Pt is alert, awake, not in acute distress Cardiovascular: RRR, S1/S2 +, no rubs, no gallops Respiratory: CTA bilaterally, no wheezing, no rhonchi Abdominal: Soft, minimal distention, mild tenderness in the lower abdomen, + BS  Extremities: Pedal edema bilaterally 2+   Data Reviewed: I have personally reviewed following labs and imaging studies  CBC: Recent Labs  Lab 08/20/18 0328 08/21/18 0359 08/22/18 0320 08/23/18 0348 08/24/18 0442  WBC 9.9 12.8* 14.7* 10.6* 10.1  NEUTROABS  --   --  11.7* 9.0* 8.6*  HGB 9.2* 9.6* 10.2* 9.1* 9.2*  HCT 29.4* 30.5* 32.3* 29.3* 29.1*  MCV 81.7 81.8 82.0 83.0 82.9  PLT 329 372 429* 358 629   Basic Metabolic Panel: Recent Labs  Lab 08/20/18 0328 08/21/18 0711 08/22/18 0320 08/23/18 0348 08/24/18 0442  NA 138 136 137 139 137  K 3.4* 3.4* 3.8 4.1 4.1  CL 101 98 99 101 100  CO2 29 28 28 28 29   GLUCOSE 107*  121* 107* 115* 132*  BUN 15 15 12 11 16   CREATININE 0.73 0.82 0.77 0.67 0.80  CALCIUM 8.5* 8.9 8.6* 8.4* 8.4*  MG  --   --  2.1  --  2.0   GFR: Estimated Creatinine Clearance: 124.6 mL/min (by C-G formula based on SCr of 0.8 mg/dL). Liver Function Tests: Recent Labs  Lab 08/20/18 0328 08/21/18 0711 08/22/18 0320 08/23/18 0348 08/24/18 0442  AST 44* 65* 53* 30 23  ALT 58* 78* 70* 48* 39  ALKPHOS 671* 937* 878* 660* 571*  BILITOT 4.5* 4.5* 3.5* 3.2* 2.6*  PROT 5.2* 5.9* 5.8* 5.1* 5.2*  ALBUMIN 2.2* 2.6* 2.3* 2.1* 2.0*   No results for input(s): LIPASE, AMYLASE in the last 168 hours. No results for input(s): AMMONIA in the last 168 hours. Coagulation Profile: No results for input(s): INR, PROTIME in the last 168 hours. Cardiac Enzymes: No results for input(s): CKTOTAL, CKMB, CKMBINDEX, TROPONINI in the last 168 hours. BNP (last 3 results) No results for input(s): PROBNP in the last 8760 hours. HbA1C: No results for input(s): HGBA1C in the last 72 hours. CBG: Recent Labs  Lab 08/23/18 1630 08/23/18 2114 08/24/18 0006 08/24/18 0451 08/24/18 0720  GLUCAP 129* 115* 109* 119* 104*   Lipid Profile: No results for input(s): CHOL, HDL, LDLCALC, TRIG, CHOLHDL, LDLDIRECT in the last 72 hours. Thyroid Function Tests: No results for input(s): TSH, T4TOTAL, FREET4, T3FREE, THYROIDAB in the last 72 hours. Anemia Panel: No results for input(s): VITAMINB12, FOLATE, FERRITIN, TIBC, IRON, RETICCTPCT in the last 72 hours. Sepsis Labs: No results for input(s): PROCALCITON, LATICACIDVEN in the last 168 hours.  No results found for this or any previous visit (from the past 240 hour(s)).    Radiology Studies: Dg Abd 1 View  Result Date: 08/24/2018 CLINICAL DATA:  Small-bowel obstruction. EXAM: ABDOMEN - 1 VIEW COMPARISON:  08/23/2018. FINDINGS: Distended loops of small and large bowel are noted. These findings are consistent with adynamic ileus. Similar findings noted on prior exam.  Continued follow-up exams to demonstrate resolution suggested. No free air. Bony Abby IMPRESSION: Distended loops of small large bowel are again noted. These findings are consistent adynamic ileus. Similar findings noted on prior exam. Continued follow-up exam suggested to demonstrate clearing. Electronically Signed   By: Marcello Moores  Register   On: 08/24/2018 09:19   Dg Abd 1 View  Result Date: 08/23/2018 CLINICAL DATA:  Small bowel obstruction. EXAM: ABDOMEN - 1 VIEW COMPARISON:  Radiographs of August 22, 2018. FINDINGS: Mildly improved small bowel dilatation is noted concerning for ileus or distal small bowel obstruction. No definite colonic dilatation is noted. No abnormal calcifications are noted. IMPRESSION: Mildly improved small bowel dilatation is noted suggesting improving ileus or distal small bowel obstruction. Continued radiographic follow-up is recommended. Electronically Signed   By: Marijo Conception, M.D.   On: 08/23/2018 10:45   [name] Ascites (abdomen Limited)  Result Date: 08/22/2018 CLINICAL DATA:  Ascites. EXAM: LIMITED ABDOMEN ULTRASOUND FOR ASCITES TECHNIQUE: Limited ultrasound survey for ascites was performed in all four abdominal quadrants. COMPARISON:  CT 08/17/2018. FINDINGS: Mild amount of  ascites demonstrated. IMPRESSION: Mild amount of ascites. Electronically Signed   By: Marcello Moores  Register   On: 08/22/2018 14:50      Scheduled Meds: . bisacodyl  5 mg Oral Daily  . dexamethasone  4 mg Oral Q12H  . docusate sodium  100 mg Oral BID  . fentaNYL  100 mcg Transdermal Q72H  . insulin aspart  0-9 Units Subcutaneous Q4H  . naloxegol oxalate  25 mg Oral Daily  . nutrition supplement (JUVEN)  1 packet Oral BID BM  . pantoprazole  40 mg Oral BID  . rivaroxaban  10 mg Oral Daily  . sucralfate  1 g Oral TID WC & HS   Continuous Infusions: . sodium chloride 250 mL (08/21/18 1430)     LOS: 17 days    Time spent: Total of 15 minutes spent with pt, greater than 50% of which was  spent in discussion of  treatment, counseling and coordination of care   Chipper Oman, MD Pager: Text Page via [email or web address]   If 7PM-7AM, please contact night-coverage [email or web address] 08/24/2018, 11:27 AM   Note - This record has been created using [employer]. Chart creation errors have been sought, but may not always have been located. Such creation errors do not reflect on the standard of medical care.

## 2018-08-24 NOTE — Progress Notes (Signed)
Alexander Fry is feeling better.  He is still having bowel movements.  He does not feel as bloated.  He is eating a little bit better.  I think he is still is just having liquids right now.  He has had no fever.  He has had no bleeding.  He has had no problems urinating.  His bilirubin and liver tests continue to improve.  His bilirubin is 2.6.  His alkaline phosphatase is 571.  The creatinine is 0.8.  He is still quite anemic.  I think he is iron deficient.  I will check iron studies on him.  We made to give a dose of IV iron.  He is ambulating.  He has had no rashes.  There is been no cough or shortness of breath.  All of his vital signs look stable.  His temperature is 97.6.  Pulse 81.  Blood pressure 137/87.  Abdomen is still slightly distended.  Bowel sounds are decreased but present.  He has no guarding or rebound tenderness.  Lungs are clear.  Cardiac exam regular rate and rhythm.  Extremities still show some mild edema.  Hopefully, Alexander Fry will continue to improve.  I think the real key will be him in for advancing his diet and still been able to go to the bathroom and not have any issues.  His abdominal x-ray yesterday showed improvement.  Hopefully the x-ray did today will show that also.  I am very happy about the liver test improvement.  I will check his iron studies.  I appreciate the great care that he is getting from everybody on 4 E.  Lattie Haw, MD  Alexander Fry 4:1

## 2018-08-24 NOTE — Progress Notes (Signed)
[facility] Progress Note     Subjective: CC-  Patient ambulating around the room. States that he is feeling more normal today. Still bloated, but he reports less abdominal pain. Denies any n/v over night or this morning. Denies nausea with clear liquids, but he did get nauseated yesterday with medication administration. Passed gas yesterday and had a small BM. No flatus today.  Objective: Vital signs in last 24 hours: Temp:  [97.6 F (36.4 C)-98.7 F (37.1 C)] 97.6 F (36.4 C) (09/19 0450) Pulse Rate:  [76-86] 81 (09/19 0450) Resp:  [12-16] 12 (09/19 0450) BP: (119-137)/(73-87) 137/87 (09/19 0450) SpO2:  [98 %] 98 % (09/19 0450) Weight:  [102.4 kg] 102.4 kg (09/19 0538) Last BM Date: 08/23/18  Intake/Output from previous day: 09/18 0701 - 09/19 0700 In: 1950 [P.O.:150; I.V.:1800] Out: 500 [Urine:500] Intake/Output this shift: Total I/O In: 120 [P.O.:120] Out: -   PE: Gen:  Alert, NAD, pleasant HEENT: EOM's intact, pupils equal and round Card:  RRR, 1+ edema BLE Pulm:  CTAB, no W/R/R, effort normal Abd: Soft, distended, subjective lower abdominal TTP, some BS present Ext:  Calves soft and nontender Skin: no rashes noted, warm and dry  Lab Results:  Recent Labs    08/23/18 0348 08/24/18 0442  WBC 10.6* 10.1  HGB 9.1* 9.2*  HCT 29.3* 29.1*  PLT 358 364   BMET Recent Labs    08/23/18 0348 08/24/18 0442  NA 139 137  K 4.1 4.1  CL 101 100  CO2 28 29  GLUCOSE 115* 132*  BUN 11 16  CREATININE 0.67 0.80  CALCIUM 8.4* 8.4*   PT/INR No results for input(s): LABPROT, INR in the last 72 hours. CMP     Component Value Date/Time   NA 137 08/24/2018 0442   NA 147 (H) 11/11/2017 1337   NA 141 10/13/2017 1118   K 4.1 08/24/2018 0442   K 3.9 11/11/2017 1337   K 3.6 10/13/2017 1118   CL 100 08/24/2018 0442   CL 107 11/11/2017 1337   CO2 29 08/24/2018 0442   CO2 28 11/11/2017 1337   CO2 25 10/13/2017 1118   GLUCOSE 132 (H) 08/24/2018 0442   GLUCOSE 107 11/11/2017 1337   BUN 16 08/24/2018 0442   BUN 12 11/11/2017 1337   BUN 9.1 10/13/2017 1118   CREATININE 0.80 08/24/2018 0442   CREATININE 0.60 07/31/2018 0916   CREATININE 0.9 11/11/2017 1337   CREATININE 0.7 10/13/2017 1118   CALCIUM 8.4 (L) 08/24/2018 0442   CALCIUM 9.2 11/11/2017 1337   CALCIUM 9.2 10/13/2017 1118   PROT 5.2 (L) 08/24/2018 0442   PROT 6.6 11/11/2017 1337   PROT 7.4 10/13/2017 1118   ALBUMIN 2.0 (L) 08/24/2018 0442   ALBUMIN 3.3 11/11/2017 1337   ALBUMIN 3.9 10/13/2017 1118   AST 23 08/24/2018 0442   AST 185 (HH) 07/31/2018 0916   AST 38 (H) 10/13/2017 1118   ALT 39 08/24/2018 0442   ALT 151 (H) 07/31/2018 0916   ALT 38 11/11/2017 1337   ALT 34 10/13/2017 1118   ALKPHOS 571 (H) 08/24/2018 0442   ALKPHOS 166 (H) 11/11/2017 1337   ALKPHOS 181 (H) 10/13/2017 1118   BILITOT 2.6 (H) 08/24/2018 0442   BILITOT 4.1 (HH) 07/31/2018 0916   BILITOT 1.02 10/13/2017 1118   GFRNONAA >60 08/24/2018 0442   GFRNONAA >60 12/21/2017 1124   GFRAA >60 08/24/2018 0442   GFRAA >60 12/21/2017 1124   Lipase     Component Value Date/Time  LIPASE 80 (H) 08/15/2018 0836       Studies/Results: Dg Abd 1 View  Result Date: 08/24/2018 CLINICAL DATA:  Small-bowel obstruction. EXAM: ABDOMEN - 1 VIEW COMPARISON:  08/23/2018. FINDINGS: Distended loops of small and large bowel are noted. These findings are consistent with adynamic ileus. Similar findings noted on prior exam. Continued follow-up exams to demonstrate resolution suggested. No free air. Bony Abby IMPRESSION: Distended loops of small large bowel are again noted. These findings are consistent adynamic ileus. Similar findings noted on prior exam. Continued follow-up exam suggested to demonstrate clearing. Electronically Signed   By: Marcello Moores  Register   On: 08/24/2018 09:19   Dg Abd 1 View  Result Date: 08/23/2018 CLINICAL DATA:  Small bowel obstruction. EXAM: ABDOMEN - 1 VIEW COMPARISON:  Radiographs of  August 22, 2018. FINDINGS: Mildly improved small bowel dilatation is noted concerning for ileus or distal small bowel obstruction. No definite colonic dilatation is noted. No abnormal calcifications are noted. IMPRESSION: Mildly improved small bowel dilatation is noted suggesting improving ileus or distal small bowel obstruction. Continued radiographic follow-up is recommended. Electronically Signed   By: Marijo Conception, M.D.   On: 08/23/2018 10:45   [name] Ascites (abdomen Limited)  Result Date: 08/22/2018 CLINICAL DATA:  Ascites. EXAM: LIMITED ABDOMEN ULTRASOUND FOR ASCITES TECHNIQUE: Limited ultrasound survey for ascites was performed in all four abdominal quadrants. COMPARISON:  CT 08/17/2018. FINDINGS: Mild amount of ascites demonstrated. IMPRESSION: Mild amount of ascites. Electronically Signed   By: Marcello Moores  Register   On: 08/22/2018 14:50    Anti-infectives: Anti-infectives (From admission, onward)   Start     Dose/Rate Route Frequency Ordered Stop   08/07/18 1800  ciprofloxacin (CIPRO) IVPB 400 mg  Status:  Discontinued     400 mg 200 mL/hr over 60 Minutes Intravenous Every 12 hours 08/07/18 0321 08/09/18 1514   08/07/18 0300  metroNIDAZOLE (FLAGYL) IVPB 500 mg  Status:  Discontinued     500 mg 100 mL/hr over 60 Minutes Intravenous Every 8 hours 08/07/18 0253 08/09/18 1514   08/07/18 0300  ciprofloxacin (CIPRO) IVPB 400 mg     400 mg 200 mL/hr over 60 Minutes Intravenous  Once 08/07/18 0258 08/07/18 0415       Assessment/Plan Metastatic renal call carcinoma Diabetes mellitus Normocytic anemia Acute pancreatitis- visible on MRI, improving, GI has signed off  Abdominal pain/distention, pSBO vs. Ileus -patient appears to have more of an ileus type pattern rather than an obstruction, xray today shows distended small and large bowel.   -passed gas yesterday and had a small BM. No n/v with clear liquids. Will advance to full liquids. Continue ambulating and bowel regimen. Monitor  K/Mag (currently in good range).  FEN - IVF, full liquids, Juven VTE - Xarelto ID - none   LOS: 17 days    Wellington Hampshire , [facility] 08/24/2018, 9:23 AM Pager: [phone] Consults: [phone] Mon 7:00 am -11:30 AM Tues-Fri 7:00 am-4:30 pm Sat-Sun 7:00 am-11:30 am

## 2018-08-24 NOTE — Progress Notes (Signed)
Patient could not swallow the Carafate Tablet. PCP was notified to see if Liquid Carafate would be appropriate, awaiting any new orders.

## 2018-08-25 DIAGNOSIS — R52 Pain, unspecified: Secondary | ICD-10-CM

## 2018-08-25 LAB — COMPREHENSIVE METABOLIC PANEL
ALT: 40 U/L (ref 0–44)
ANION GAP: 10 (ref 5–15)
AST: 30 U/L (ref 15–41)
Albumin: 2.3 g/dL — ABNORMAL LOW (ref 3.5–5.0)
Alkaline Phosphatase: 657 U/L — ABNORMAL HIGH (ref 38–126)
BILIRUBIN TOTAL: 3 mg/dL — AB (ref 0.3–1.2)
BUN: 16 mg/dL (ref 6–20)
CALCIUM: 8.9 mg/dL (ref 8.9–10.3)
CO2: 29 mmol/L (ref 22–32)
CREATININE: 0.73 mg/dL (ref 0.61–1.24)
Chloride: 100 mmol/L (ref 98–111)
GFR calc Af Amer: >60 mL/min (ref >60)
GLUCOSE: 120 mg/dL — AB (ref 70–99)
Potassium: 4 mmol/L (ref 3.5–5.1)
Sodium: 139 mmol/L (ref 135–145)
Total Protein: 5.6 g/dL — ABNORMAL LOW (ref 6.5–8.1)

## 2018-08-25 LAB — GLUCOSE, CAPILLARY
GLUCOSE-CAPILLARY: 116 mg/dL — AB (ref 70–99)
GLUCOSE-CAPILLARY: 85 mg/dL (ref 70–99)
Glucose-Capillary: 103 mg/dL — ABNORMAL HIGH (ref 70–99)
Glucose-Capillary: 104 mg/dL — ABNORMAL HIGH (ref 70–99)
Glucose-Capillary: 137 mg/dL — ABNORMAL HIGH (ref 70–99)
Glucose-Capillary: 96 mg/dL (ref 70–99)

## 2018-08-25 LAB — CBC WITH DIFFERENTIAL/PLATELET
BASOS ABS: 0 10*3/uL (ref 0.0–0.1)
Basophils Relative: 0 %
Eosinophils Absolute: 0 10*3/uL (ref 0.0–0.7)
Eosinophils Relative: 0 %
HEMATOCRIT: 33.9 % — AB (ref 39.0–52.0)
Hemoglobin: 10.5 g/dL — ABNORMAL LOW (ref 13.0–17.0)
Lymphocytes Relative: 10 %
Lymphs Abs: 1.3 10*3/uL (ref 0.7–4.0)
MCH: 25.9 pg — ABNORMAL LOW (ref 26.0–34.0)
MCHC: 31 g/dL (ref 30.0–36.0)
MCV: 83.5 fL (ref 78.0–100.0)
Monocytes Absolute: 1.4 10*3/uL — ABNORMAL HIGH (ref 0.1–1.0)
Monocytes Relative: 11 %
NEUTROS ABS: 10.4 10*3/uL — AB (ref 1.7–7.7)
Neutrophils Relative %: 79 %
Platelets: 503 10*3/uL — ABNORMAL HIGH (ref 150–400)
RBC: 4.06 MIL/uL — ABNORMAL LOW (ref 4.22–5.81)
RDW: 21.2 % — ABNORMAL HIGH (ref 11.5–15.5)
WBC: 13.1 10*3/uL — ABNORMAL HIGH (ref 4.0–10.5)

## 2018-08-25 LAB — MAGNESIUM: Magnesium: 2 mg/dL (ref 1.7–2.4)

## 2018-08-25 MED ORDER — FUROSEMIDE 20 MG PO TABS
10.0000 mg | ORAL_TABLET | Freq: Once | ORAL | Status: AC
  Administered 2018-08-25: 10 mg via ORAL
  Filled 2018-08-25: qty 1

## 2018-08-25 MED ORDER — METOCLOPRAMIDE HCL 5 MG/ML IJ SOLN
5.0000 mg | Freq: Three times a day (TID) | INTRAMUSCULAR | Status: AC | PRN
  Administered 2018-08-25 – 2018-08-27 (×3): 5 mg via INTRAVENOUS
  Filled 2018-08-25 (×3): qty 2

## 2018-08-25 MED ORDER — SODIUM CHLORIDE 0.9 % IV SOLN
750.0000 mg | Freq: Once | INTRAVENOUS | Status: AC
  Administered 2018-08-25: 750 mg via INTRAVENOUS
  Filled 2018-08-25: qty 15

## 2018-08-25 NOTE — Progress Notes (Signed)
Unfortunately, Mr. Degrazia really is no better today.  He had no bowel movement yesterday.  His abdomen is quite distended.  He did have an abdominal x-ray yesterday.  It still shows significant bowel dilation.  I really think that he needs to be decompressed.  Since surgery is not that enthusiastic to operate on him, I suspect that he probably will need to have an NG tube placed.  I have to believe that there is some anatomic issue that is causing this problem.  He really has not gotten that much better since he been hospitalized.  I would not think that this duodenitis would be the cause.  His labs show that his bilirubin is 3.  Alkaline phosphatase is 657.  Creatinine 0.73.  Calcium is 8.9 with an albumin of 2.3.  His medications have been switched to oral.  The are not nearly as effective he believes.  He felt that the IV Decadron was doing a nice job with his abdominal inflammation.  On his exam, vital signs show temperature of 97.8.  Pulse 78.  Blood pressure 115/76.  Abdomen is distended.  Bowel sounds are high-pitched.  He has some slight epigastric tenderness to palpation.  There is no obvious fluid wave.  There is no palpable liver or spleen tip.  Lungs are clear.  Cardiac exam regular rate and rhythm.  Extremities shows sounds slight decrease in his edema.  Of note, he does have iron deficiency.  I will give him a dose of IV iron.  This might actually help his intestinal function.  Again, ultimately, I think that he needs to be decompressed.  I would really love if surgery would consider this.  However, they do not feel that surgery would be appropriate due to the duodenitis.  As such, I think a NG tube is what is mandatory.  I just hate that he is not really getting better.  I think that if he were to go home, he would be back in the hospital in a couple days with the same problems.  I am praying hard about this.  I know that he is getting ready care from all the staff up on [address].  Lattie Haw, MD  Darlyn Chamber 30:17

## 2018-08-25 NOTE — Progress Notes (Addendum)
PROGRESS NOTE [facility]   Delwin Raczkowski   TGP:[id number] DOB: 12-18-1960  DOA: 08/06/2018 PCP: [name], PA-C   Brief Narrative:  Alexander Fry is a 57 year old male with medical history of metastatic renal cell carcinoma (bone, brain, lung) iron deficiency anemia who presented to the emergency department complaining of abdominal pain.  Was found to have acute pancreatitis.  Gastroenterology was consulted and recommended conservative management.  Despite treatment patient continued to have significant pain requiring IV pain medication therefore further images, CT of the abdomen was repeated on 9/12 which showed duodenitis.  He was started on empiric antibiotic.  Despite treatment patient continued to have significant abdominal pain and KUB was ordered were performed revealing small bowel obstruction.  Surgery was consulted and recommended conservative management.  Subjective: Patient seen and examined, he has no nausea, he tells me that his abdominal pain was exacerbated yesterday by bladder distention due to the Lasix.  Appetite okay.  Report had to be on yesterday after Lasix.  Continues to pass flatus.  No bowel movement yesterday.  Ambulating with no issues.  Lower extremity edema decreased.   Assessment & Plan: Acute pancreatitis/duodenitis Patient completed abx therapy with Cipro and Flagyl.  This patient seems to have resolved.  Ileus - clinically improving  Felt related to chronic opiate use and poor mobility.  General surgery was consulted recommended conservative management.  Abdominal x-ray shows ileus pattern, no signs of obstruction.  Patient tolerated full liquid diet, will advance to soft.  He tells me that his pain is stable.  Patient continues to clinically improve.  No further x-rays needed at this time unless the patient decompensate.  No need for decompression at this time as patient is tolerating diet well and passing gas.  Continue to monitor electrolytes  and keep potassium above 4 and magnesium above 2.  Continue current pain management with oral medication.  Taper down steroids, no need for abdominal purpose.  Will defer to oncology if needed for renal cell carcinoma.  Leukocytosis Likely to steroids, will taper down.  Metastatic renal cell carcinoma Per oncology  Right IJ thrombus Continue Xarelto  Lower extremity edema (chronic), hypoalbuminemia  Multifactorial, decreasing lower extremity edema after Lasix, will give 10 mg PO today   DVT prophylaxis: Xarelto  Code Status: Full  Family Communication: None  Disposition Plan: If tolerating soft diet hopefully home in a.m.  Consultants:   Oncology   Palliative Care   Procedures:   None   Antimicrobials:   None   Objective: Vitals:   08/24/18 1231 08/24/18 2003 08/25/18 0415 08/25/18 0418  BP: 131/82 123/72  115/76  Pulse: 85 77  78  Resp: 16 18  20   Temp: 97.8 F (36.6 C) 98.8 F (37.1 C)  97.8 F (36.6 C)  TempSrc:  Oral  Oral  SpO2: 98% 99%  96%  Weight:   102.1 kg   Height:        Intake/Output Summary (Last 24 hours) at 08/25/2018 1346 Last data filed at 08/25/2018 0600 Gross per 24 hour  Intake 1200 ml  Output 1150 ml  Net 50 ml   Filed Weights   08/23/18 0439 08/24/18 0538 08/25/18 0415  Weight: 101 kg 102.4 kg 102.1 kg    Examination:  General: Pt is alert, awake, not in acute distress Cardiovascular: RRR, S1/S2 +, no rubs, no gallops Respiratory: CTA bilaterally, no wheezing, no rhonchi Abdominal: Soft, mild distended, nontender, + BS Extremities: Lateral pedal edema 1+  Data Reviewed: I  have personally reviewed following labs and imaging studies  CBC: Recent Labs  Lab 08/21/18 0359 08/22/18 0320 08/23/18 0348 08/24/18 0442 08/25/18 0959  WBC 12.8* 14.7* 10.6* 10.1 13.1*  NEUTROABS  --  11.7* 9.0* 8.6* 10.4*  HGB 9.6* 10.2* 9.1* 9.2* 10.5*  HCT 30.5* 32.3* 29.3* 29.1* 33.9*  MCV 81.8 82.0 83.0 82.9 83.5  PLT 372 429* 358 364  778*   Basic Metabolic Panel: Recent Labs  Lab 08/21/18 0711 08/22/18 0320 08/23/18 0348 08/24/18 0442 08/25/18 0428  NA 136 137 139 137 139  K 3.4* 3.8 4.1 4.1 4.0  CL 98 99 101 100 100  CO2 28 28 28 29 29   GLUCOSE 121* 107* 115* 132* 120*  BUN 15 12 11 16 16   CREATININE 0.82 0.77 0.67 0.80 0.73  CALCIUM 8.9 8.6* 8.4* 8.4* 8.9  MG  --  2.1  --  2.0 2.0   GFR: Estimated Creatinine Clearance: 124.4 mL/min (by C-G formula based on SCr of 0.73 mg/dL). Liver Function Tests: Recent Labs  Lab 08/21/18 0711 08/22/18 0320 08/23/18 0348 08/24/18 0442 08/25/18 0428  AST 65* 53* 30 23 30   ALT 78* 70* 48* 39 40  ALKPHOS 937* 878* 660* 571* 657*  BILITOT 4.5* 3.5* 3.2* 2.6* 3.0*  PROT 5.9* 5.8* 5.1* 5.2* 5.6*  ALBUMIN 2.6* 2.3* 2.1* 2.0* 2.3*   No results for input(s): LIPASE, AMYLASE in the last 168 hours. No results for input(s): AMMONIA in the last 168 hours. Coagulation Profile: No results for input(s): INR, PROTIME in the last 168 hours. Cardiac Enzymes: No results for input(s): CKTOTAL, CKMB, CKMBINDEX, TROPONINI in the last 168 hours. BNP (last 3 results) No results for input(s): PROBNP in the last 8760 hours. HbA1C: No results for input(s): HGBA1C in the last 72 hours. CBG: Recent Labs  Lab 08/24/18 2002 08/25/18 0007 08/25/18 0408 08/25/18 0733 08/25/18 1137  GLUCAP 142* 104* 116* 96 85   Lipid Profile: No results for input(s): CHOL, HDL, LDLCALC, TRIG, CHOLHDL, LDLDIRECT in the last 72 hours. Thyroid Function Tests: No results for input(s): TSH, T4TOTAL, FREET4, T3FREE, THYROIDAB in the last 72 hours. Anemia Panel: Recent Labs    08/24/18 1054  FERRITIN 3,017*  TIBC 177*  IRON 31*   Sepsis Labs: No results for input(s): PROCALCITON, LATICACIDVEN in the last 168 hours.  No results found for this or any previous visit (from the past 240 hour(s)).    Radiology Studies: Dg Abd 1 View  Result Date: 08/24/2018 CLINICAL DATA:  Small-bowel  obstruction. EXAM: ABDOMEN - 1 VIEW COMPARISON:  08/23/2018. FINDINGS: Distended loops of small and large bowel are noted. These findings are consistent with adynamic ileus. Similar findings noted on prior exam. Continued follow-up exams to demonstrate resolution suggested. No free air. Bony Abby IMPRESSION: Distended loops of small large bowel are again noted. These findings are consistent adynamic ileus. Similar findings noted on prior exam. Continued follow-up exam suggested to demonstrate clearing. Electronically Signed   By: Marcello Moores  Register   On: 08/24/2018 09:19    Scheduled Meds: . bisacodyl  5 mg Oral Daily  . dexamethasone  4 mg Oral Q12H  . docusate sodium  100 mg Oral BID  . fentaNYL  100 mcg Transdermal Q72H  . insulin aspart  0-9 Units Subcutaneous Q4H  . naloxegol oxalate  25 mg Oral Daily  . pantoprazole  40 mg Oral BID  . rivaroxaban  10 mg Oral Daily  . sucralfate  1 g Oral TID WC & HS  Continuous Infusions: . sodium chloride 250 mL (08/21/18 1430)     LOS: 18 days   Time spent: Total of 15 minutes spent with pt, greater than 50% of which was spent in discussion of  treatment, counseling and coordination of care   Chipper Oman, MD Pager: Text Page via [email or web address]   If 7PM-7AM, please contact night-coverage [email or web address] 08/25/2018, 1:46 PM   Note - This record has been created using [employer]. Chart creation errors have been sought, but may not always have been located. Such creation errors do not reflect on the standard of medical care.

## 2018-08-25 NOTE — Progress Notes (Signed)
Patient ID: Alexander Fry, male   DOB: 1961-08-16, 57 y.o.   MRN: [id number]       Subjective: Patient with no nausea.  Doesn't like much on the full liquids menu.  Still passing flatus.  Not much stool on his films so no stool in the last day, as expected.  Walking.  Still on fentanyl and diluadid.  Objective: Vital signs in last 24 hours: Temp:  [97.8 F (36.6 C)-98.8 F (37.1 C)] 97.8 F (36.6 C) (09/20 0418) Pulse Rate:  [77-85] 78 (09/20 0418) Resp:  [16-20] 20 (09/20 0418) BP: (115-131)/(72-82) 115/76 (09/20 0418) SpO2:  [96 %-99 %] 96 % (09/20 0418) Weight:  [102.1 kg] 102.1 kg (09/20 0415) Last BM Date: 08/23/18  Intake/Output from previous day: 09/19 0701 - 09/20 0700 In: 1320 [P.O.:1320] Out: 2050 [Urine:2050] Intake/Output this shift: No intake/output data recorded.  PE: Heart: regular Lungs: CTAB Abd: he actually seems a little softer than the last time I saw him, few BS, NT  Lab Results:  Recent Labs    08/23/18 0348 08/24/18 0442  WBC 10.6* 10.1  HGB 9.1* 9.2*  HCT 29.3* 29.1*  PLT 358 364   BMET Recent Labs    08/24/18 0442 08/25/18 0428  NA 137 139  K 4.1 4.0  CL 100 100  CO2 29 29  GLUCOSE 132* 120*  BUN 16 16  CREATININE 0.80 0.73  CALCIUM 8.4* 8.9   PT/INR No results for input(s): LABPROT, INR in the last 72 hours. CMP     Component Value Date/Time   NA 139 08/25/2018 0428   NA 147 (H) 11/11/2017 1337   NA 141 10/13/2017 1118   K 4.0 08/25/2018 0428   K 3.9 11/11/2017 1337   K 3.6 10/13/2017 1118   CL 100 08/25/2018 0428   CL 107 11/11/2017 1337   CO2 29 08/25/2018 0428   CO2 28 11/11/2017 1337   CO2 25 10/13/2017 1118   GLUCOSE 120 (H) 08/25/2018 0428   GLUCOSE 107 11/11/2017 1337   BUN 16 08/25/2018 0428   BUN 12 11/11/2017 1337   BUN 9.1 10/13/2017 1118   CREATININE 0.73 08/25/2018 0428   CREATININE 0.60 07/31/2018 0916   CREATININE 0.9 11/11/2017 1337   CREATININE 0.7 10/13/2017 1118   CALCIUM 8.9 08/25/2018 0428   CALCIUM 9.2 11/11/2017 1337   CALCIUM 9.2 10/13/2017 1118   PROT 5.6 (L) 08/25/2018 0428   PROT 6.6 11/11/2017 1337   PROT 7.4 10/13/2017 1118   ALBUMIN 2.3 (L) 08/25/2018 0428   ALBUMIN 3.3 11/11/2017 1337   ALBUMIN 3.9 10/13/2017 1118   AST 30 08/25/2018 0428   AST 185 (HH) 07/31/2018 0916   AST 38 (H) 10/13/2017 1118   ALT 40 08/25/2018 0428   ALT 151 (H) 07/31/2018 0916   ALT 38 11/11/2017 1337   ALT 34 10/13/2017 1118   ALKPHOS 657 (H) 08/25/2018 0428   ALKPHOS 166 (H) 11/11/2017 1337   ALKPHOS 181 (H) 10/13/2017 1118   BILITOT 3.0 (H) 08/25/2018 0428   BILITOT 4.1 (HH) 07/31/2018 0916   BILITOT 1.02 10/13/2017 1118   GFRNONAA >60 08/25/2018 0428   GFRNONAA >60 12/21/2017 1124   GFRAA >60 08/25/2018 0428   GFRAA >60 12/21/2017 1124   Lipase     Component Value Date/Time   LIPASE 80 (H) 08/15/2018 0836       Studies/Results: Dg Abd 1 View  Result Date: 08/24/2018 CLINICAL DATA:  Small-bowel obstruction. EXAM: ABDOMEN - 1 VIEW COMPARISON:  08/23/2018. FINDINGS:  Distended loops of small and large bowel are noted. These findings are consistent with adynamic ileus. Similar findings noted on prior exam. Continued follow-up exams to demonstrate resolution suggested. No free air. Bony Abby IMPRESSION: Distended loops of small large bowel are again noted. These findings are consistent adynamic ileus. Similar findings noted on prior exam. Continued follow-up exam suggested to demonstrate clearing. Electronically Signed   By: Marcello Moores  Register   On: 08/24/2018 09:19    Anti-infectives: Anti-infectives (From admission, onward)   Start     Dose/Rate Route Frequency Ordered Stop   08/07/18 1800  ciprofloxacin (CIPRO) IVPB 400 mg  Status:  Discontinued     400 mg 200 mL/hr over 60 Minutes Intravenous Every 12 hours 08/07/18 0321 08/09/18 1514   08/07/18 0300  metroNIDAZOLE (FLAGYL) IVPB 500 mg  Status:  Discontinued     500 mg 100 mL/hr over 60 Minutes Intravenous Every 8 hours  08/07/18 0253 08/09/18 1514   08/07/18 0300  ciprofloxacin (CIPRO) IVPB 400 mg     400 mg 200 mL/hr over 60 Minutes Intravenous  Once 08/07/18 0258 08/07/18 0415       Assessment/Plan Metastatic renal call carcinoma Diabetes mellitus Normocytic anemia Acute pancreatitis- visible on MRI, improving, GI has signed off  Abdominal pain/distention, Ileus -patient appears to have an ileus.  His small bowel and his colon show distention on his films yesterday.  He is not obstructed.  Unfortunately, while he is on as much narcotic as he is on, his gut motility is going to continue to slow down.  We can try a low dose of reglan to see if this helps his gut motility.  An NGT will not be useful at this time as it will not improve gut function.  He is not throwing up and therefore an NGT is likely not beneficial at this time. -doesn't like full liquids.  Will give him a soft diet so he can pick what he wants to eat.  He may continue to eat as long as he tolerates this.  Multiple small, frequent meals a day is most beneficial, but difficult in the hospital to do.  He does not like any of the protein supplements.  He can have carnation instant breakfast to help with caloric intake as well. -surgery is contraindicated for an ileus.  Mobility, decreasing narcotics, time, keeping K and mag normal (which they are) and adding motility agents is about all that can be done at this point.  We discussed that a slowing of his motility may be a chronic thing for him going forward due to his narcotic usage.    FEN- IVF, soft diet, carnation instant breakfast VTE- Xarelto ID- none   LOS: 18 days    Henreitta Cea , [facility] Surgery 08/25/2018, 10:15 AM Pager: [phone]

## 2018-08-26 LAB — COMPREHENSIVE METABOLIC PANEL
ALT: 36 U/L (ref 0–44)
AST: 30 U/L (ref 15–41)
Albumin: 2.1 g/dL — ABNORMAL LOW (ref 3.5–5.0)
Alkaline Phosphatase: 590 U/L — ABNORMAL HIGH (ref 38–126)
Anion gap: 8 (ref 5–15)
BILIRUBIN TOTAL: 2.4 mg/dL — AB (ref 0.3–1.2)
BUN: 12 mg/dL (ref 6–20)
CHLORIDE: 99 mmol/L (ref 98–111)
CO2: 31 mmol/L (ref 22–32)
Calcium: 8.3 mg/dL — ABNORMAL LOW (ref 8.9–10.3)
Creatinine, Ser: 0.69 mg/dL (ref 0.61–1.24)
GFR calc Af Amer: >60 mL/min (ref >60)
GFR calc non Af Amer: >60 mL/min (ref >60)
GLUCOSE: 110 mg/dL — AB (ref 70–99)
POTASSIUM: 3.9 mmol/L (ref 3.5–5.1)
Sodium: 138 mmol/L (ref 135–145)
Total Protein: 5 g/dL — ABNORMAL LOW (ref 6.5–8.1)

## 2018-08-26 LAB — CBC WITH DIFFERENTIAL/PLATELET
BASOS PCT: 0 %
Basophils Absolute: 0 10*3/uL (ref 0.0–0.1)
EOS PCT: 0 %
Eosinophils Absolute: 0 10*3/uL (ref 0.0–0.7)
HEMATOCRIT: 30.8 % — AB (ref 39.0–52.0)
Hemoglobin: 9.8 g/dL — ABNORMAL LOW (ref 13.0–17.0)
LYMPHS ABS: 0.6 10*3/uL — AB (ref 0.7–4.0)
Lymphocytes Relative: 5 %
MCH: 26.4 pg (ref 26.0–34.0)
MCHC: 31.8 g/dL (ref 30.0–36.0)
MCV: 83 fL (ref 78.0–100.0)
MONOS PCT: 9 %
Monocytes Absolute: 1.1 10*3/uL — ABNORMAL HIGH (ref 0.1–1.0)
Neutro Abs: 10.2 10*3/uL — ABNORMAL HIGH (ref 1.7–7.7)
Neutrophils Relative %: 86 %
PLATELETS: 442 10*3/uL — AB (ref 150–400)
RBC: 3.71 MIL/uL — ABNORMAL LOW (ref 4.22–5.81)
RDW: 21.2 % — ABNORMAL HIGH (ref 11.5–15.5)
WBC: 11.9 10*3/uL — AB (ref 4.0–10.5)

## 2018-08-26 LAB — GLUCOSE, CAPILLARY
GLUCOSE-CAPILLARY: 100 mg/dL — AB (ref 70–99)
GLUCOSE-CAPILLARY: 125 mg/dL — AB (ref 70–99)
GLUCOSE-CAPILLARY: 149 mg/dL — AB (ref 70–99)
GLUCOSE-CAPILLARY: 81 mg/dL (ref 70–99)
Glucose-Capillary: 133 mg/dL — ABNORMAL HIGH (ref 70–99)
Glucose-Capillary: 92 mg/dL (ref 70–99)
Glucose-Capillary: 98 mg/dL (ref 70–99)

## 2018-08-26 MED ORDER — ONDANSETRON HCL 4 MG/2ML IJ SOLN
4.0000 mg | Freq: Three times a day (TID) | INTRAMUSCULAR | Status: DC | PRN
  Administered 2018-08-27 – 2018-08-28 (×2): 4 mg via INTRAVENOUS
  Filled 2018-08-26 (×2): qty 2

## 2018-08-26 NOTE — Progress Notes (Signed)
PROGRESS NOTE    Alexander Fry  ATF:[id number] DOB: Jan 12, 1961 DOA: 08/06/2018 PCP: [name], PA-C  Brief Narrative:57 year old male with medical history of metastatic renal cell carcinoma (bone, brain, lung) iron deficiency anemia who presented to the emergency department complaining of abdominal pain.  Was found to have acute pancreatitis.  Gastroenterology was consulted and recommended conservative management.  Despite treatment patient continued to have significant pain requiring IV pain medication therefore further images, CT of the abdomen was repeated on 9/12 which showed duodenitis.  He was started on empiric antibiotic.  Despite treatment patient continued to have significant abdominal pain and KUB was ordered were performed revealing small bowel obstruction.  Surgery was consulted and recommended conservative management.   Assessment & Plan:   Principal Problem:   Pancreatitis Active Problems:   Transaminitis   Abdominal pain   Nausea & vomiting   Hypokalemia   Diabetes mellitus type 2 in obese (HCC)   Internal jugular (IJ) vein thromboembolism, chronic, right (HCC)   Normocytic anemia   Duodenitis   Uncontrolled pain   Metastatic renal cell carcinoma ([place])   Full code status   Pain Acute pancreatitis/duodenitis Patient completed abx therapy with Cipro and Flagyl.  This patient seems to have resolved.  Ileus - clinically improving  Felt related to chronic opiate use and poor mobility.  General surgery was consulted recommended conservative management.  Abdominal x-ray shows ileus pattern, no signs of obstruction.  Patient tolerated full liquid diet, will advance to soft.  He tells me that his pain is stable.  Patient continues to clinically improve.  No further x-rays needed at this time unless the patient decompensate.  No need for decompression at this time as patient is tolerating diet well and passing gas.  Continue to monitor electrolytes and keep potassium  above 4 and magnesium above 2.  Continue current pain management with oral medication.  Taper down steroids, no need for abdominal purpose.  Will defer to oncology if needed for renal cell carcinoma.  Leukocytosis Likely to steroids, will taper down.  Metastatic renal cell carcinoma Per oncology  Right IJ thrombus Continue Xarelto  Lower extremity edema (chronic), hypoalbuminemia  Multifactorial, decreasing lower extremity edema after Lasix, will give 10 mg PO today       DVT prophylaxis: xarelto Code Status: full Family Communion none Disposition Plan: tbd   Consultants: onc,surgery  Procedures: mrcp Antimicrobials: none   Subjective:ambulating in hall way had a bm yesterday  Objective: Vitals:   08/25/18 1406 08/25/18 2125 08/26/18 0359 08/26/18 0500  BP: 113/75 (!) 136/100  130/80  Pulse: 80 83  74  Resp: 18 16  18   Temp: 98.2 F (36.8 C) 98.1 F (36.7 C)  (!) 97.5 F (36.4 C)  TempSrc: Oral Oral  Oral  SpO2: 96% 100%  96%  Weight:   101.8 kg   Height:        Intake/Output Summary (Last 24 hours) at 08/26/2018 1029 Last data filed at 08/26/2018 0900 Gross per 24 hour  Intake 1060 ml  Output 1851 ml  Net -791 ml   Filed Weights   08/24/18 0538 08/25/18 0415 08/26/18 0359  Weight: 102.4 kg 102.1 kg 101.8 kg    Examination:  General exam: Appears calm and comfortable  Respiratory system: Clear to auscultation. Respiratory effort normal. Cardiovascular system: S1 & S2 heard, RRR. No JVD, murmurs, rubs, gallops or clicks. No pedal edema. Gastrointestinal system: Abdomen is nondistended, soft and nontender. No organomegaly or masses felt. Normal bowel  sounds heard. Central nervous system: Alert and oriented. No focal neurological deficits. Extremities: Symmetric 5 x 5 power. Skin: No rashes, lesions or ulcers Psychiatry: Judgement and insight appear normal. Mood & affect appropriate.     Data Reviewed: I have personally reviewed following labs  and imaging studies  CBC: Recent Labs  Lab 08/22/18 0320 08/23/18 0348 08/24/18 0442 08/25/18 0959 08/26/18 0335  WBC 14.7* 10.6* 10.1 13.1* 11.9*  NEUTROABS 11.7* 9.0* 8.6* 10.4* 10.2*  HGB 10.2* 9.1* 9.2* 10.5* 9.8*  HCT 32.3* 29.3* 29.1* 33.9* 30.8*  MCV 82.0 83.0 82.9 83.5 83.0  PLT 429* 358 364 503* 409*   Basic Metabolic Panel: Recent Labs  Lab 08/22/18 0320 08/23/18 0348 08/24/18 0442 08/25/18 0428 08/26/18 0335  NA 137 139 137 139 138  K 3.8 4.1 4.1 4.0 3.9  CL 99 101 100 100 99  CO2 28 28 29 29 31   GLUCOSE 107* 115* 132* 120* 110*  BUN 12 11 16 16 12   CREATININE 0.77 0.67 0.80 0.73 0.69  CALCIUM 8.6* 8.4* 8.4* 8.9 8.3*  MG 2.1  --  2.0 2.0  --    GFR: Estimated Creatinine Clearance: 124.3 mL/min (by C-G formula based on SCr of 0.69 mg/dL). Liver Function Tests: Recent Labs  Lab 08/22/18 0320 08/23/18 0348 08/24/18 0442 08/25/18 0428 08/26/18 0335  AST 53* 30 23 30 30   ALT 70* 48* 39 40 36  ALKPHOS 878* 660* 571* 657* 590*  BILITOT 3.5* 3.2* 2.6* 3.0* 2.4*  PROT 5.8* 5.1* 5.2* 5.6* 5.0*  ALBUMIN 2.3* 2.1* 2.0* 2.3* 2.1*   No results for input(s): LIPASE, AMYLASE in the last 168 hours. No results for input(s): AMMONIA in the last 168 hours. Coagulation Profile: No results for input(s): INR, PROTIME in the last 168 hours. Cardiac Enzymes: No results for input(s): CKTOTAL, CKMB, CKMBINDEX, TROPONINI in the last 168 hours. BNP (last 3 results) No results for input(s): PROBNP in the last 8760 hours. HbA1C: No results for input(s): HGBA1C in the last 72 hours. CBG: Recent Labs  Lab 08/25/18 1646 08/25/18 2208 08/26/18 0031 08/26/18 0350 08/26/18 0807  GLUCAP 103* 137* 92 98 149*   Lipid Profile: No results for input(s): CHOL, HDL, LDLCALC, TRIG, CHOLHDL, LDLDIRECT in the last 72 hours. Thyroid Function Tests: No results for input(s): TSH, T4TOTAL, FREET4, T3FREE, THYROIDAB in the last 72 hours. Anemia Panel: Recent Labs    08/24/18 1054   FERRITIN 3,017*  TIBC 177*  IRON 31*   Sepsis Labs: No results for input(s): PROCALCITON, LATICACIDVEN in the last 168 hours.  No results found for this or any previous visit (from the past 240 hour(s)).       Radiology Studies: No results found.      Scheduled Meds: . bisacodyl  5 mg Oral Daily  . dexamethasone  4 mg Oral Q12H  . docusate sodium  100 mg Oral BID  . fentaNYL  100 mcg Transdermal Q72H  . insulin aspart  0-9 Units Subcutaneous Q4H  . naloxegol oxalate  25 mg Oral Daily  . pantoprazole  40 mg Oral BID  . rivaroxaban  10 mg Oral Daily  . sucralfate  1 g Oral TID WC & HS   Continuous Infusions: . sodium chloride 250 mL (08/21/18 1430)     LOS: 19 days    Georgette Shell, MD [facility]  If 7PM-7AM, please contact night-coverage [email or web address] Password [facility] 08/26/2018, 10:29 AM

## 2018-08-26 NOTE — Progress Notes (Signed)
Patient ID: Alexander Fry, male   DOB: [birth year], 57 y.o.   MRN: [id number] [facility] Surgery Progress Note:   * No surgery found *  Subjective: Mental status is clear.  No complaints.  Eating a hearty breakfast Objective: Vital signs in last 24 hours: Temp:  [97.5 F (36.4 C)-98.2 F (36.8 C)] 97.5 F (36.4 C) (09/21 0500) Pulse Rate:  [74-83] 74 (09/21 0500) Resp:  [16-18] 18 (09/21 0500) BP: (113-136)/(75-100) 130/80 (09/21 0500) SpO2:  [96 %-100 %] 96 % (09/21 0500) Weight:  [101.8 kg] 101.8 kg (09/21 0359)  Intake/Output from previous day: 09/20 0701 - 09/21 0700 In: 940 [P.O.:940] Out: 1851 [Urine:1850; Stool:1] Intake/Output this shift: No intake/output data recorded.  Physical Exam: Work of breathing is normal.  Continues with pedal edema.    Lab Results:  Results for orders placed or performed during the hospital encounter of 08/06/18 (from the past 48 hour(s))  Ferritin     Status: Abnormal   Collection Time: 08/24/18 10:54 AM  Result Value Ref Range   Ferritin 3,017 (H) 24 - 336 ng/mL    Comment: Performed at [facility], [place] [address] [place], [place] [address]  Iron and TIBC     Status: Abnormal   Collection Time: 08/24/18 10:54 AM  Result Value Ref Range   Iron 31 (L) 45 - 182 ug/dL   TIBC 177 (L) 250 - 450 ug/dL   Saturation Ratios 17 (L) 17.9 - 39.5 %   UIBC 146 ug/dL    Comment: Performed at [facility], [place] [address], [place], [place] [address]  Glucose, capillary     Status: Abnormal   Collection Time: 08/24/18 11:28 AM  Result Value Ref Range   Glucose-Capillary 105 (H) 70 - 99 mg/dL  Glucose, capillary     Status: Abnormal   Collection Time: 08/24/18  4:35 PM  Result Value Ref Range   Glucose-Capillary 117 (H) 70 - 99 mg/dL  Glucose, capillary     Status: Abnormal   Collection Time: 08/24/18  8:02 PM  Result Value Ref Range   Glucose-Capillary 142 (H) 70 - 99 mg/dL  Glucose, capillary     Status:  Abnormal   Collection Time: 08/25/18 12:07 AM  Result Value Ref Range   Glucose-Capillary 104 (H) 70 - 99 mg/dL  Glucose, capillary     Status: Abnormal   Collection Time: 08/25/18  4:08 AM  Result Value Ref Range   Glucose-Capillary 116 (H) 70 - 99 mg/dL  Comprehensive metabolic panel     Status: Abnormal   Collection Time: 08/25/18  4:28 AM  Result Value Ref Range   Sodium 139 135 - 145 mmol/L   Potassium 4.0 3.5 - 5.1 mmol/L   Chloride 100 98 - 111 mmol/L   CO2 29 22 - 32 mmol/L   Glucose, Bld 120 (H) 70 - 99 mg/dL   BUN 16 6 - 20 mg/dL   Creatinine, Ser 0.73 0.61 - 1.24 mg/dL   Calcium 8.9 8.9 - 10.3 mg/dL   Total Protein 5.6 (L) 6.5 - 8.1 g/dL   Albumin 2.3 (L) 3.5 - 5.0 g/dL   AST 30 15 - 41 U/L   ALT 40 0 - 44 U/L   Alkaline Phosphatase 657 (H) 38 - 126 U/L   Total Bilirubin 3.0 (H) 0.3 - 1.2 mg/dL   GFR calc non Af Amer >60 >60 mL/min   GFR calc Af Amer >60 >60 mL/min    Comment: (NOTE) The  eGFR has been calculated using the CKD EPI equation. This calculation has not been validated in all clinical situations. eGFR's persistently <60 mL/min signify possible Chronic Kidney Disease.    Anion gap 10 5 - 15    Comment: Performed at [facility], [place] [address], [place], [place] [address]  Magnesium     Status: None   Collection Time: 08/25/18  4:28 AM  Result Value Ref Range   Magnesium 2.0 1.7 - 2.4 mg/dL    Comment: Performed at [facility], [place] [address], [place], [place] [address]  Glucose, capillary     Status: None   Collection Time: 08/25/18  7:33 AM  Result Value Ref Range   Glucose-Capillary 96 70 - 99 mg/dL  CBC with Differential/Platelet     Status: Abnormal   Collection Time: 08/25/18  9:59 AM  Result Value Ref Range   WBC 13.1 (H) 4.0 - 10.5 K/uL   RBC 4.06 (L) 4.22 - 5.81 MIL/uL   Hemoglobin 10.5 (L) 13.0 - 17.0 g/dL   HCT 33.9 (L) 39.0 - 52.0 %   MCV 83.5 78.0 - 100.0 fL   MCH 25.9 (L) 26.0 - 34.0 pg   MCHC  31.0 30.0 - 36.0 g/dL   RDW 21.2 (H) 11.5 - 15.5 %   Platelets 503 (H) 150 - 400 K/uL   Neutrophils Relative % 79 %   Lymphocytes Relative 10 %   Monocytes Relative 11 %   Eosinophils Relative 0 %   Basophils Relative 0 %   Neutro Abs 10.4 (H) 1.7 - 7.7 K/uL   Lymphs Abs 1.3 0.7 - 4.0 K/uL   Monocytes Absolute 1.4 (H) 0.1 - 1.0 K/uL   Eosinophils Absolute 0.0 0.0 - 0.7 K/uL   Basophils Absolute 0.0 0.0 - 0.1 K/uL   Smear Review MORPHOLOGY UNREMARKABLE     Comment: Performed at [facility], [place] [address] [place], [place] [address]  Glucose, capillary     Status: None   Collection Time: 08/25/18 11:37 AM  Result Value Ref Range   Glucose-Capillary 85 70 - 99 mg/dL  Glucose, capillary     Status: Abnormal   Collection Time: 08/25/18  4:46 PM  Result Value Ref Range   Glucose-Capillary 103 (H) 70 - 99 mg/dL  Glucose, capillary     Status: Abnormal   Collection Time: 08/25/18 10:08 PM  Result Value Ref Range   Glucose-Capillary 137 (H) 70 - 99 mg/dL  Glucose, capillary     Status: None   Collection Time: 08/26/18 12:31 AM  Result Value Ref Range   Glucose-Capillary 92 70 - 99 mg/dL  CBC with Differential/Platelet     Status: Abnormal   Collection Time: 08/26/18  3:35 AM  Result Value Ref Range   WBC 11.9 (H) 4.0 - 10.5 K/uL   RBC 3.71 (L) 4.22 - 5.81 MIL/uL   Hemoglobin 9.8 (L) 13.0 - 17.0 g/dL   HCT 30.8 (L) 39.0 - 52.0 %   MCV 83.0 78.0 - 100.0 fL   MCH 26.4 26.0 - 34.0 pg   MCHC 31.8 30.0 - 36.0 g/dL   RDW 21.2 (H) 11.5 - 15.5 %   Platelets 442 (H) 150 - 400 K/uL   Neutrophils Relative % 86 %   Lymphocytes Relative 5 %   Monocytes Relative 9 %   Eosinophils Relative 0 %   Basophils Relative 0 %   Neutro Abs 10.2 (H) 1.7 - 7.7 K/uL   Lymphs Abs  0.6 (L) 0.7 - 4.0 K/uL   Monocytes Absolute 1.1 (H) 0.1 - 1.0 K/uL   Eosinophils Absolute 0.0 0.0 - 0.7 K/uL   Basophils Absolute 0.0 0.0 - 0.1 K/uL   Smear Review MORPHOLOGY UNREMARKABLE     Comment:  Performed at [facility], [place] [address] [place], [place] [address]  Comprehensive metabolic panel     Status: Abnormal   Collection Time: 08/26/18  3:35 AM  Result Value Ref Range   Sodium 138 135 - 145 mmol/L   Potassium 3.9 3.5 - 5.1 mmol/L   Chloride 99 98 - 111 mmol/L   CO2 31 22 - 32 mmol/L   Glucose, Bld 110 (H) 70 - 99 mg/dL   BUN 12 6 - 20 mg/dL   Creatinine, Ser 0.69 0.61 - 1.24 mg/dL   Calcium 8.3 (L) 8.9 - 10.3 mg/dL   Total Protein 5.0 (L) 6.5 - 8.1 g/dL   Albumin 2.1 (L) 3.5 - 5.0 g/dL   AST 30 15 - 41 U/L   ALT 36 0 - 44 U/L   Alkaline Phosphatase 590 (H) 38 - 126 U/L   Total Bilirubin 2.4 (H) 0.3 - 1.2 mg/dL   GFR calc non Af Amer >60 >60 mL/min   GFR calc Af Amer >60 >60 mL/min    Comment: (NOTE) The eGFR has been calculated using the CKD EPI equation. This calculation has not been validated in all clinical situations. eGFR's persistently <60 mL/min signify possible Chronic Kidney Disease.    Anion gap 8 5 - 15    Comment: Performed at [facility], [place] [address], [place], [place] [address]  Glucose, capillary     Status: None   Collection Time: 08/26/18  3:50 AM  Result Value Ref Range   Glucose-Capillary 98 70 - 99 mg/dL  Glucose, capillary     Status: Abnormal   Collection Time: 08/26/18  8:07 AM  Result Value Ref Range   Glucose-Capillary 149 (H) 70 - 99 mg/dL    Radiology/Results: No results found.  Anti-infectives: Anti-infectives (From admission, onward)   Start     Dose/Rate Route Frequency Ordered Stop   08/07/18 1800  ciprofloxacin (CIPRO) IVPB 400 mg  Status:  Discontinued     400 mg 200 mL/hr over 60 Minutes Intravenous Every 12 hours 08/07/18 0321 08/09/18 1514   08/07/18 0300  metroNIDAZOLE (FLAGYL) IVPB 500 mg  Status:  Discontinued     500 mg 100 mL/hr over 60 Minutes Intravenous Every 8 hours 08/07/18 0253 08/09/18 1514   08/07/18 0300  ciprofloxacin (CIPRO) IVPB 400 mg     400 mg 200 mL/hr  over 60 Minutes Intravenous  Once 08/07/18 0258 08/07/18 0415      Assessment/Plan: Problem List: Patient Active Problem List   Diagnosis Date Noted  . Pain   . Full code status   . Uncontrolled pain   . Metastatic renal cell carcinoma ([place])   . Pancreatitis 08/07/2018  . Abdominal pain 08/07/2018  . Nausea & vomiting 08/07/2018  . Hypokalemia 08/07/2018  . Diabetes mellitus type 2 in obese ([place]) 08/07/2018  . Internal jugular (IJ) vein thromboembolism, chronic, right ([place]) 08/07/2018  . Normocytic anemia 08/07/2018  . Duodenitis 08/07/2018  . Malnutrition of moderate degree 08/04/2018  . Elevated LFTs 08/01/2018  . Iron deficiency anemia due to chronic blood loss 05/23/2018  . Hematuria, gross 05/03/2018  . Sleep disturbance 04/06/2018  . Knee arthropathy 04/03/2018  . Bilateral primary osteoarthritis of knee 04/03/2018  .  Drug-induced jaundice 04/21/2017  . Rash and nonspecific skin eruption 04/21/2017  . Transaminitis 02/20/2017  . Onychomycosis of left great toe 02/17/2017  . Corn of foot 02/17/2017  . Brain metastases ([place]) 01/30/2017  . Bone metastases ([place]) 01/30/2017  . Internal hemorrhoids 01/07/2017  . Kidney cancer, primary, with metastasis from kidney to other site, right ([place]) 01/05/2017  . Acute right-sided low back pain with right-sided sciatica 12/24/2016  . Goals of care, counseling/discussion 12/22/2016  . Benign prostatic hyperplasia with nocturia 12/10/2016  . Abnormal CT of the chest 12/10/2016  . Hilar adenopathy 12/10/2016  . Multiple pulmonary nodules determined by computed tomography of lung 12/10/2016    Eating and feeling better.  Surgery will sign off.   * No surgery found *    LOS: 19 days   Matt B. Hassell Done, MD, [facility] [phone] beeper [phone]  08/26/2018 8:27 AM

## 2018-08-27 LAB — CBC WITH DIFFERENTIAL/PLATELET
BASOS PCT: 0 %
Basophils Absolute: 0 10*3/uL (ref 0.0–0.1)
EOS ABS: 0 10*3/uL (ref 0.0–0.7)
Eosinophils Relative: 0 %
HCT: 30 % — ABNORMAL LOW (ref 39.0–52.0)
Hemoglobin: 9.4 g/dL — ABNORMAL LOW (ref 13.0–17.0)
Lymphocytes Relative: 5 %
Lymphs Abs: 0.7 10*3/uL (ref 0.7–4.0)
MCH: 26.3 pg (ref 26.0–34.0)
MCHC: 31.3 g/dL (ref 30.0–36.0)
MCV: 83.8 fL (ref 78.0–100.0)
MONO ABS: 0.9 10*3/uL (ref 0.1–1.0)
Monocytes Relative: 7 %
NEUTROS PCT: 88 %
Neutro Abs: 11.5 10*3/uL — ABNORMAL HIGH (ref 1.7–7.7)
PLATELETS: 391 10*3/uL (ref 150–400)
RBC: 3.58 MIL/uL — AB (ref 4.22–5.81)
RDW: 21.1 % — ABNORMAL HIGH (ref 11.5–15.5)
WBC: 13.1 10*3/uL — AB (ref 4.0–10.5)

## 2018-08-27 LAB — GLUCOSE, CAPILLARY
GLUCOSE-CAPILLARY: 119 mg/dL — AB (ref 70–99)
GLUCOSE-CAPILLARY: 105 mg/dL — AB (ref 70–99)
GLUCOSE-CAPILLARY: 94 mg/dL (ref 70–99)
Glucose-Capillary: 93 mg/dL (ref 70–99)
Glucose-Capillary: 132 mg/dL — ABNORMAL HIGH (ref 70–99)

## 2018-08-27 LAB — COMPREHENSIVE METABOLIC PANEL
ALT: 34 U/L (ref 0–44)
AST: 26 U/L (ref 15–41)
Albumin: 2 g/dL — ABNORMAL LOW (ref 3.5–5.0)
Alkaline Phosphatase: 522 U/L — ABNORMAL HIGH (ref 38–126)
Anion gap: 8 (ref 5–15)
BUN: 13 mg/dL (ref 6–20)
CHLORIDE: 99 mmol/L (ref 98–111)
CO2: 31 mmol/L (ref 22–32)
CREATININE: 0.76 mg/dL (ref 0.61–1.24)
Calcium: 8.1 mg/dL — ABNORMAL LOW (ref 8.9–10.3)
GFR calc non Af Amer: >60 mL/min (ref >60)
Glucose, Bld: 136 mg/dL — ABNORMAL HIGH (ref 70–99)
Potassium: 3.8 mmol/L (ref 3.5–5.1)
SODIUM: 138 mmol/L (ref 135–145)
Total Bilirubin: 2.1 mg/dL — ABNORMAL HIGH (ref 0.3–1.2)
Total Protein: 5.1 g/dL — ABNORMAL LOW (ref 6.5–8.1)

## 2018-08-27 MED ORDER — DEXAMETHASONE 4 MG PO TABS
2.0000 mg | ORAL_TABLET | Freq: Two times a day (BID) | ORAL | Status: DC
  Administered 2018-08-27 – 2018-08-28 (×2): 2 mg via ORAL
  Filled 2018-08-27 (×2): qty 1

## 2018-08-27 MED ORDER — POLYETHYLENE GLYCOL 3350 17 G PO PACK
17.0000 g | PACK | Freq: Three times a day (TID) | ORAL | Status: DC
  Administered 2018-08-27 – 2018-08-28 (×2): 17 g via ORAL
  Filled 2018-08-27 (×2): qty 1

## 2018-08-27 NOTE — Progress Notes (Signed)
PROGRESS NOTE    Alexander Fry  SNK:[id number] DOB: [birth year] DOA: 08/06/2018 PCP: [name], PA-C   Brief Narrative:57 year old male with medical history of metastatic renal cell carcinoma (bone, brain, lung) iron deficiency anemia who presented to the emergency department complaining of abdominal pain. Was found to have acute pancreatitis. Gastroenterology was consulted and recommended conservative management. Despite treatment patient continued to have significant pain requiring IV pain medication therefore further images, CT of the abdomen was repeated on 9/12 which showed duodenitis. He was started on empiric antibiotic. Despite treatment patient continued to have significant abdominal pain and KUB was ordered were performed revealing small bowel obstruction. Surgery was consulted and recommended conservative management Assessment & Plan:   Principal Problem:   Pancreatitis Active Problems:   Transaminitis   Abdominal pain   Nausea & vomiting   Hypokalemia   Diabetes mellitus type 2 in obese (HCC)   Internal jugular (IJ) vein thromboembolism, chronic, right (HCC)   Normocytic anemia   Duodenitis   Uncontrolled pain   Metastatic renal cell carcinoma ([place])   Full code status   Pain  Acute pancreatitis/duodenitis Patient completed abx therapy with Cipro and Flagyl. This patient seems to have resolved.  Ileus - clinically improving Feltrelated to chronic opiate use and poor mobility. General surgery was consulted recommended conservative management. Abdominal x-ray shows ileus pattern, no signs of obstruction. Patient tolerating regular diet.taper steroids further.  No decompression is indicated at this time as patient is not having any vomiting.  He is hesitant to be discharged today he wants to wait another day or 2 and make sure he is still feeling better.  He is requesting his MiraLAX to be restarted.  He was on MiraLAX 2 packets 3 times a day.  This dose  was decreased to 1 packet 3 times a day only last week.  Currently he is not getting any MiraLAX.  Will restart MiraLAX 1 packet 3 times a day.  Leukocytosis stable with no signs of infection.  Metastatic renal cell carcinoma Per oncology  Right IJ thrombus Continue Xarelto  Lower extremity edema (chronic), hypoalbuminemia  Multifactorial, does not want to wear TED hose.     DVT prophylaxis: Xarelto Code Status: Full code Family Communication: No family available Disposition Plan TBD   Consultants: Oncology, general surgery   Procedures: MRCP Antimicrobials: None  Subjective: Ambulating in hallway passed gas and had a bowel movement yesterday none today.   Objective: Vitals:   08/27/18 0301 08/27/18 0400 08/27/18 0534 08/27/18 1151  BP:   118/72 114/65  Pulse:   64 78  Resp:   16 16  Temp: 98.4 F (36.9 C)  98.5 F (36.9 C) (!) 97.3 F (36.3 C)  TempSrc: Oral  Oral Oral  SpO2:   98% 98%  Weight:  103.6 kg    Height:        Intake/Output Summary (Last 24 hours) at 08/27/2018 1213 Last data filed at 08/27/2018 1157 Gross per 24 hour  Intake 2120 ml  Output 1200 ml  Net 920 ml   Filed Weights   08/25/18 0415 08/26/18 0359 08/27/18 0400  Weight: 102.1 kg 101.8 kg 103.6 kg    Examination:  General exam: Appears calm and comfortable  Respiratory system: Clear to auscultation. Respiratory effort normal. Cardiovascular system: S1 & S2 heard, RRR. No JVD, murmurs, rubs, gallops or clicks. No pedal edema. Gastrointestinal system: Abdomen is distended, hard and nontender. No organomegaly or masses felt. Normal bowel sounds heard. Central nervous system:  Alert and oriented. No focal neurological deficits. Extremities: Symmetric 5 x 5 power. Skin: No rashes, lesions or ulcers Psychiatry: Judgement and insight appear normal. Mood & affect appropriate.     Data Reviewed: I have personally reviewed following labs and imaging studies  CBC: Recent Labs  Lab  08/23/18 0348 08/24/18 0442 08/25/18 0959 08/26/18 0335 08/27/18 0457  WBC 10.6* 10.1 13.1* 11.9* 13.1*  NEUTROABS 9.0* 8.6* 10.4* 10.2* 11.5*  HGB 9.1* 9.2* 10.5* 9.8* 9.4*  HCT 29.3* 29.1* 33.9* 30.8* 30.0*  MCV 83.0 82.9 83.5 83.0 83.8  PLT 358 364 503* 442* 643   Basic Metabolic Panel: Recent Labs  Lab 08/22/18 0320 08/23/18 0348 08/24/18 0442 08/25/18 0428 08/26/18 0335 08/27/18 0457  NA 137 139 137 139 138 138  K 3.8 4.1 4.1 4.0 3.9 3.8  CL 99 101 100 100 99 99  CO2 28 28 29 29 31 31   GLUCOSE 107* 115* 132* 120* 110* 136*  BUN 12 11 16 16 12 13   CREATININE 0.77 0.67 0.80 0.73 0.69 0.76  CALCIUM 8.6* 8.4* 8.4* 8.9 8.3* 8.1*  MG 2.1  --  2.0 2.0  --   --    GFR: Estimated Creatinine Clearance: 125.3 mL/min (by C-G formula based on SCr of 0.76 mg/dL). Liver Function Tests: Recent Labs  Lab 08/23/18 0348 08/24/18 0442 08/25/18 0428 08/26/18 0335 08/27/18 0457  AST 30 23 30 30 26   ALT 48* 39 40 36 34  ALKPHOS 660* 571* 657* 590* 522*  BILITOT 3.2* 2.6* 3.0* 2.4* 2.1*  PROT 5.1* 5.2* 5.6* 5.0* 5.1*  ALBUMIN 2.1* 2.0* 2.3* 2.1* 2.0*   No results for input(s): LIPASE, AMYLASE in the last 168 hours. No results for input(s): AMMONIA in the last 168 hours. Coagulation Profile: No results for input(s): INR, PROTIME in the last 168 hours. Cardiac Enzymes: No results for input(s): CKTOTAL, CKMB, CKMBINDEX, TROPONINI in the last 168 hours. BNP (last 3 results) No results for input(s): PROBNP in the last 8760 hours. HbA1C: No results for input(s): HGBA1C in the last 72 hours. CBG: Recent Labs  Lab 08/26/18 2023 08/26/18 2344 08/27/18 0340 08/27/18 0758 08/27/18 1145  GLUCAP 133* 81 119* 105* 93   Lipid Profile: No results for input(s): CHOL, HDL, LDLCALC, TRIG, CHOLHDL, LDLDIRECT in the last 72 hours. Thyroid Function Tests: No results for input(s): TSH, T4TOTAL, FREET4, T3FREE, THYROIDAB in the last 72 hours. Anemia Panel: No results for input(s):  VITAMINB12, FOLATE, FERRITIN, TIBC, IRON, RETICCTPCT in the last 72 hours. Sepsis Labs: No results for input(s): PROCALCITON, LATICACIDVEN in the last 168 hours.  No results found for this or any previous visit (from the past 240 hour(s)).       Radiology Studies: No results found.      Scheduled Meds: . bisacodyl  5 mg Oral Daily  . dexamethasone  4 mg Oral Q12H  . docusate sodium  100 mg Oral BID  . fentaNYL  100 mcg Transdermal Q72H  . insulin aspart  0-9 Units Subcutaneous Q4H  . naloxegol oxalate  25 mg Oral Daily  . pantoprazole  40 mg Oral BID  . polyethylene glycol  17 g Oral TID  . rivaroxaban  10 mg Oral Daily  . sucralfate  1 g Oral TID WC & HS   Continuous Infusions: . sodium chloride 250 mL (08/26/18 1638)     LOS: 20 days     Georgette Shell, MD [facility]  If 7PM-7AM, please contact night-coverage [email or web address] Password [facility] 08/27/2018,  12:13 PM

## 2018-08-28 DIAGNOSIS — K567 Ileus, unspecified: Secondary | ICD-10-CM

## 2018-08-28 LAB — CBC WITH DIFFERENTIAL/PLATELET
BASOS ABS: 0 10*3/uL (ref 0.0–0.1)
Basophils Relative: 0 %
Eosinophils Absolute: 0 10*3/uL (ref 0.0–0.7)
Eosinophils Relative: 0 %
HEMATOCRIT: 30.3 % — AB (ref 39.0–52.0)
Hemoglobin: 9.5 g/dL — ABNORMAL LOW (ref 13.0–17.0)
LYMPHS ABS: 0.7 10*3/uL (ref 0.7–4.0)
LYMPHS PCT: 5 %
MCH: 26.5 pg (ref 26.0–34.0)
MCHC: 31.4 g/dL (ref 30.0–36.0)
MCV: 84.6 fL (ref 78.0–100.0)
MONO ABS: 1.1 10*3/uL — AB (ref 0.1–1.0)
MONOS PCT: 8 %
Neutro Abs: 12.5 10*3/uL — ABNORMAL HIGH (ref 1.7–7.7)
Neutrophils Relative %: 87 %
Platelets: 422 10*3/uL — ABNORMAL HIGH (ref 150–400)
RBC: 3.58 MIL/uL — ABNORMAL LOW (ref 4.22–5.81)
RDW: 21 % — AB (ref 11.5–15.5)
WBC: 14.3 10*3/uL — ABNORMAL HIGH (ref 4.0–10.5)

## 2018-08-28 LAB — COMPREHENSIVE METABOLIC PANEL
ALT: 34 U/L (ref 0–44)
AST: 29 U/L (ref 15–41)
Albumin: 2 g/dL — ABNORMAL LOW (ref 3.5–5.0)
Alkaline Phosphatase: 529 U/L — ABNORMAL HIGH (ref 38–126)
Anion gap: 8 (ref 5–15)
BILIRUBIN TOTAL: 1.8 mg/dL — AB (ref 0.3–1.2)
BUN: 14 mg/dL (ref 6–20)
CO2: 30 mmol/L (ref 22–32)
CREATININE: 0.7 mg/dL (ref 0.61–1.24)
Calcium: 8.1 mg/dL — ABNORMAL LOW (ref 8.9–10.3)
Chloride: 100 mmol/L (ref 98–111)
GFR calc Af Amer: >60 mL/min (ref >60)
Glucose, Bld: 118 mg/dL — ABNORMAL HIGH (ref 70–99)
POTASSIUM: 3.7 mmol/L (ref 3.5–5.1)
Sodium: 138 mmol/L (ref 135–145)
TOTAL PROTEIN: 5.1 g/dL — AB (ref 6.5–8.1)

## 2018-08-28 LAB — GLUCOSE, CAPILLARY
GLUCOSE-CAPILLARY: 89 mg/dL (ref 70–99)
Glucose-Capillary: 120 mg/dL — ABNORMAL HIGH (ref 70–99)
Glucose-Capillary: 72 mg/dL (ref 70–99)

## 2018-08-28 MED ORDER — NALOXEGOL OXALATE 25 MG PO TABS: 25.0000 mg | ORAL_TABLET | Freq: Every day | ORAL | 0 refills | Status: AC

## 2018-08-28 MED ORDER — HYDROMORPHONE HCL 4 MG PO TABS: 4.0000 mg | ORAL_TABLET | ORAL | 0 refills | Status: DC | PRN

## 2018-08-28 MED ORDER — FUROSEMIDE 20 MG PO TABS: 20.0000 mg | ORAL_TABLET | Freq: Every day | ORAL | 0 refills | Status: DC

## 2018-08-28 MED ORDER — DEXAMETHASONE 0.5 MG PO TABS: ORAL_TABLET | ORAL | 0 refills | Status: DC

## 2018-08-28 MED ORDER — FUROSEMIDE 10 MG/ML IJ SOLN
40.0000 mg | Freq: Once | INTRAMUSCULAR | Status: AC
  Administered 2018-08-28: 40 mg via INTRAVENOUS
  Filled 2018-08-28: qty 4

## 2018-08-28 MED ORDER — SIMETHICONE 80 MG PO CHEW: 80.0000 mg | CHEWABLE_TABLET | Freq: Four times a day (QID) | ORAL | 0 refills | Status: DC | PRN

## 2018-08-28 MED ORDER — FENTANYL 100 MCG/HR TD PT72: 100.0000 ug | MEDICATED_PATCH | TRANSDERMAL | 0 refills | Status: AC

## 2018-08-28 MED ORDER — BISACODYL 5 MG PO TBEC: 5.0000 mg | DELAYED_RELEASE_TABLET | Freq: Every day | ORAL | 0 refills | Status: DC

## 2018-08-28 MED ORDER — FUROSEMIDE 10 MG/ML IJ SOLN: 60.0000 mg | Freq: Once | INTRAMUSCULAR | Status: DC

## 2018-08-28 MED ORDER — HEPARIN SOD (PORK) LOCK FLUSH 100 UNIT/ML IV SOLN
500.0000 [IU] | INTRAVENOUS | Status: AC | PRN
  Administered 2018-08-28: 500 [IU]

## 2018-08-28 MED ORDER — ALBUMIN HUMAN 25 % IV SOLN
50.0000 g | Freq: Once | INTRAVENOUS | Status: AC
  Administered 2018-08-28: 50 g via INTRAVENOUS
  Filled 2018-08-28: qty 200

## 2018-08-28 MED ORDER — METOCLOPRAMIDE HCL 10 MG PO TABS: 10.0000 mg | ORAL_TABLET | Freq: Three times a day (TID) | ORAL | 0 refills | Status: DC

## 2018-08-28 NOTE — Progress Notes (Signed)
Alexander Fry seems to be doing a little bit better.  He is eating solid food.  He is not having emesis.  He is having some bowel movements.  He was walking this morning when I saw him.  He does have a lot of swelling in his legs.  This is probably a combination of factors.  We did give him some iron last week.  He is anemic.  His abdomen does not look as distended.  He has had no cough.  His bilirubin is down to 1.8.  His alkaline phosphatase is down to 529.  His albumin is 2.  I am sure this is part of the reason why he is having such leg swelling.  May be, some albumin could help him followed by some Lasix.  His white cell count is 14.3.  Hemoglobin 9.5.  Platelet count 422,000.  It probably would be a bad idea to try to do an abdominal x-ray on him today.  May be, he will be able to go home soon.  Mainly, I think he is much more ready to go home now.  On his physical exam, his temperature is 98.1.  Pulse 86.  Blood pressure 123/88.  His lungs are clear.  Cardiac exam regular rate and rhythm.  Abdomen is not as distended.  He has better bowel sounds.  Extremities shows 2+ edema in his legs.  We will see what the abdominal x-ray shows.  I do not think it would be a bad idea to try him on some albumin and then give him some Lasix to see if this can help improve his leg edema.  I appreciate the great care that he is getting from all the staff up on [address].  Lattie Haw, MD  Jeneen Rinks 1:2-4

## 2018-08-28 NOTE — Progress Notes (Signed)
Nutrition Follow-up  DOCUMENTATION CODES:   Non-severe (moderate) malnutrition in context of chronic illness  INTERVENTION:  - Continue to encourage PO intakes.    NUTRITION DIAGNOSIS:   Moderate Malnutrition related to chronic illness, catabolic illness, cancer and cancer related treatments as evidenced by mild fat depletion, mild muscle depletion. -ongoing, improving.   GOAL:   Patient will meet greater than or equal to 90% of their needs -beginning to meet  MONITOR:   PO intake, Weight trends, Labs  ASSESSMENT:   57 y.o. male with hx of metastatic renal cell carcinoma (bone, brain, lung), R IJ thrombus, and iron deficiency anemia. He was recently admitted for abdominal pain and was found to have duodenitis. Following d/c, patient continued to have abdominal pain which acutely worsened on the day of admission (9/1) and was associated with N/V. He was taking Protonix PTA. Patient admitted for acute pancreatitis, which is now resolved. Patient continues to have abdominal pain and is now noted to have SBO, Surgery being re-consulted as of 9/17.  Weight is the highest it has been since admission; likely related to fluids. Diet advanced to Soft on 9/20 and patient has been eating 75-100% of meals since that time. On 9/21 he ate 75% of breakfast and lunch (also ate dinner but percentage not recorded; total of 964 kcal, 40 grams of protein) and yesterday he ate 75% of breakfast and 100% of lunch (total of 1267 kcal, 49 grams of protein), and 100% of dinner which was brought in for patient.   Patient confirms intakes have greatly improved and that he is tolerating foods and beverages much better. He denies nutrition-related questions or concerns at this time and declines offer for any ONS to be ordered at this time.    Medications reviewed; 50 g albumin x1 dose today, 100 mg Colace BID, 40 mg IV Lasix x1 dose today, sliding scale Novolog, 40 mg oral Protonix BID, 1 packet Miralax TID, 1 g  Carafate TID.  Labs reviewed; Ca: 8.1 mg/dL, Alk Phos elevated.       Diet Order:   Diet Order            DIET SOFT Room service appropriate? Yes; Fluid consistency: Thin  Diet effective now              EDUCATION NEEDS:   Education needs have been addressed  Skin:  Skin Assessment: Reviewed RN Assessment  Last BM:  9/22  Height:   Ht Readings from Last 1 Encounters:  08/06/18 6' (1.829 m)    Weight:   Wt Readings from Last 1 Encounters:  08/28/18 103.6 kg    Ideal Body Weight:  80.91 kg  BMI:  Body mass index is 30.96 kg/m.  Estimated Nutritional Needs:   Kcal:  1601-0932 (22-24 kcal/kg)  Protein:  115-125 grams   Fluid:  >/= 2.2 L/day     Jarome Matin, MS, RD, LDN, [facility] Inpatient Clinical Dietitian Pager # [phone] After hours/weekend pager # [phone]

## 2018-08-28 NOTE — Care Management Note (Signed)
Case Management Note  Patient Details  Name: Alexander Fry MRN: [id number] Date of Birth: 06-14-1961  Subjective/Objective:  No CM needs.                  Action/Plan:dc home.   Expected Discharge Date:  08/10/18               Expected Discharge Plan:  Home/Self Care  In-House Referral:     Discharge planning Services  CM Consult, Other - See comment(benefit check-eliquis)  Post Acute Care Choice:    Choice offered to:     DME Arranged:    DME Agency:     HH Arranged:    HH Agency:     Status of Service:  Completed, signed off  If discussed at [employer] of Stay Meetings, dates discussed:    Additional Comments:  Dessa Phi, RN 08/28/2018, 12:31 PM

## 2018-08-28 NOTE — Discharge Summary (Signed)
Physician Discharge Summary  Alexander Fry  XTG:[id number]  DOB: Nov 13, 1961  DOA: 08/06/2018 PCP: [name], PA-C  Admit date: 08/06/2018 Discharge date: 08/28/2018  Admitted From: Home  Disposition: Home   Recommendations for Outpatient Follow-up:  1. Follow up with PCP in 1 weeks 2. Follow up with oncology (Dr. Marin Olp) in 1-2 weeks  3. Please obtain BMP/CBC in one week to monitor Hgb and renal function  4. Steroid taper  5. Advance diet as tolerated 6. Check LFT to monitor ALP   Discharge Condition: Stable   CODE STATUS: Full Code  Diet recommendation: Regular   Brief/Interim Summary: For full details see H&P/Progress note, but in brief, Alexander Fry is a 57 year old male with medical history of type 2 DM, metastatic renal cell carcinoma (bone, brain, lung) iron deficiency anemia who presented to the emergency department complaining of abdominal pain.  Was found to have acute pancreatitis.  Gastroenterology was consulted and recommended conservative management.  Despite treatment patient continued to have significant pain requiring IV pain medication therefore further images, CT of the abdomen was repeated on 9/12 which showed duodenitis.  He was started on empiric antibiotic. Patient improved however, patient continued to have significant abdominal pain and KUB was ordered were performed revealing ileus/small bowel obstruction.  Surgery was consulted and recommended conservative management.  Patient was treated with pain medication and IV hydration.  Subsequently diet was advanced and he tolerated well and having bowel movements.  Patient has clinically improved from was deemed stable for discharge.  Subjective: Patient seen and examined, report feeling well, had barbecue pizza last night tolerated well.  No nausea or vomiting.  Passing gas and having bowel movements daily.  No acute events overnight.  Remains afebrile.  Discharge Diagnoses/Hospital Course:  Principal  Problem:   Pancreatitis Active Problems:   Transaminitis   Abdominal pain   Nausea & vomiting   Hypokalemia   Diabetes mellitus type 2 in obese (HCC)   Internal jugular (IJ) vein thromboembolism, chronic, right (HCC)   Normocytic anemia   Duodenitis   Uncontrolled pain   Metastatic renal cell carcinoma ([place])   Full code status   Pain  Acute pancreatitis/duodenitis - Resolved  Patient completed abx therapy with Cipro and Flagyl.   GI was consulted recommended conservative management   Ileus - clinically improving  Felt related to chronic opiate use and poor mobility.  Abdominal x-ray shows ileus pattern, no signs of obstruction. General surgery was consulted recommended conservative management with pain control, IV fluids and ambulation. Patient now tolerating soft/regular diet. His pain is significantly improved and tolerating PO medications well. He was advised to diminish opiates as much as possible. Patient was also treated by oncology with high dose steroids for possible inflammation, this are been tapered down. Follow up with PCP.    Leukocytosis Demargination from steroids, taper down Decadron over the next 10 days   Metastatic renal cell carcinoma Management per oncology, ALP trending down, T bili 1.8  Right IJ thrombus Continue Xarelto  Lower extremity edema (chronic), hypoalbuminemia  Multifactorial, swelling improve after lasix, will discharge on Lasix 20 mg daily, keep legs elevated when sitting or in supine position. Albumin IV was given. Check labs in 1 -2 weeks    Moderate malnutrition due to chronic illness  Continue protein supplements   DM type II  CBG's stable during hospital stay, patient was treated with SSI. Continue home medications with no changes. Follow up with PCP.   All other chronic medical condition were  stable during the hospitalization.  On the day of the discharge the patient's vitals were stable, and no other acute medical condition were  reported by patient. the patient was felt safe to be discharge to home   Discharge Instructions  You were cared for by a hospitalist during your hospital stay. If you have any questions about your discharge medications or the care you received while you were in the hospital after you are discharged, you can call the unit and asked to speak with the hospitalist on call if the hospitalist that took care of you is not available. Once you are discharged, your primary care physician will handle any further medical issues. Please note that NO REFILLS for any discharge medications will be authorized once you are discharged, as it is imperative that you return to your primary care physician (or establish a relationship with a primary care physician if you do not have one) for your aftercare needs so that they can reassess your need for medications and monitor your lab values.  Discharge Instructions    Call MD for:  difficulty breathing, headache or visual disturbances   Complete by:  As directed    Call MD for:  extreme fatigue   Complete by:  As directed    Call MD for:  hives   Complete by:  As directed    Call MD for:  persistant dizziness or light-headedness   Complete by:  As directed    Call MD for:  persistant nausea and vomiting   Complete by:  As directed    Call MD for:  redness, tenderness, or signs of infection (pain, swelling, redness, odor or green/yellow discharge around incision site)   Complete by:  As directed    Call MD for:  severe uncontrolled pain   Complete by:  As directed    Call MD for:  temperature >100.4   Complete by:  As directed    Diet Carb Modified   Complete by:  As directed    Increase activity slowly   Complete by:  As directed      Allergies as of 08/28/2018      Reactions   Feraheme [ferumoxytol] Anaphylaxis, Other (See Comments)   Flush. Patient denies anaphylaxis.      Medication List    STOP taking these medications   ciprofloxacin 500 MG  tablet Commonly known as:  CIPRO   dexamethasone 0.5 MG/5ML solution Commonly known as:  DECADRON Replaced by:  dexamethasone 0.5 MG tablet   everolimus 5 MG tablet Commonly known as:  AFINITOR   fentaNYL 25 MCG/HR patch Commonly known as:  DURAGESIC - dosed mcg/hr Replaced by:  fentaNYL 100 MCG/HR   metroNIDAZOLE 500 MG tablet Commonly known as:  FLAGYL     TAKE these medications   bisacodyl 5 MG EC tablet Commonly known as:  DULCOLAX Take 1 tablet (5 mg total) by mouth daily. Start taking on:  08/29/2018   dexamethasone 0.5 MG tablet Commonly known as:  DECADRON Take 4 tablets (2 mg total) by mouth 2 (two) times daily for 1 day, THEN 4 tablets (2 mg total) daily for 3 days, THEN 2 tablets (1 mg total) daily for 3 days, THEN 1 tablet (0.5 mg total) daily for 3 days. Start taking on:  08/28/2018 Replaces:  dexamethasone 0.5 MG/5ML solution   docusate sodium 100 MG capsule Commonly known as:  COLACE Take 1 capsule (100 mg total) by mouth 2 (two) times daily.   fentaNYL 100 MCG/HR  Commonly known as:  [place] - dosed mcg/hr Place 1 patch (100 mcg total) onto the skin every 3 (three) days for 15 days. Replaces:  fentaNYL 25 MCG/HR patch   furosemide 20 MG tablet Commonly known as:  LASIX Take 1 tablet (20 mg total) by mouth daily.   glimepiride 4 MG tablet Commonly known as:  AMARYL Take 1 tablet (4 mg total) by mouth daily with breakfast.   HYDROmorphone 4 MG tablet Commonly known as:  DILAUDID Take 1 tablet (4 mg total) by mouth every 4 (four) hours as needed for moderate pain or severe pain. What changed:  when to take this   metoCLOPramide 10 MG tablet Commonly known as:  REGLAN Take 1 tablet (10 mg total) by mouth 3 (three) times daily before meals. What changed:  when to take this   naloxegol oxalate 25 MG Tabs tablet Commonly known as:  MOVANTIK Take 1 tablet (25 mg total) by mouth daily. Start taking on:  08/29/2018   pantoprazole 40 MG  tablet Commonly known as:  PROTONIX Take 1 tablet (40 mg total) by mouth daily.   polyethylene glycol packet Commonly known as:  MIRALAX / GLYCOLAX Take 17 g by mouth daily.   rivaroxaban 10 MG Tabs tablet Commonly known as:  XARELTO Take 1 tablet (10 mg total) by mouth daily with supper. What changed:  how much to take   simethicone 80 MG chewable tablet Commonly known as:  MYLICON Chew 1 tablet (80 mg total) by mouth 4 (four) times daily as needed for flatulence.      Follow-up Information    [name], [place]. Schedule an appointment as soon as possible for a visit in 1 week(s).   Specialty:  Physician Assistant Why:  Hospital follow up  Contact information: [place] [address] [phone]        [name], MD. Schedule an appointment as soon as possible for a visit in 2 week(s).   Specialty:  Oncology Why:  Hospital follow up  Contact information: [address] [place] [address] [phone]          Allergies  Allergen Reactions  . Feraheme [Ferumoxytol] Anaphylaxis and Other (See Comments)    Flush. Patient denies anaphylaxis.    Consultations:  GI  General surgery  Oncology    Procedures/Studies: Dg Abd 1 View  Result Date: 08/24/2018 CLINICAL DATA:  Small-bowel obstruction. EXAM: ABDOMEN - 1 VIEW COMPARISON:  08/23/2018. FINDINGS: Distended loops of small and large bowel are noted. These findings are consistent with adynamic ileus. Similar findings noted on prior exam. Continued follow-up exams to demonstrate resolution suggested. No free air. Bony Abby IMPRESSION: Distended loops of small large bowel are again noted. These findings are consistent adynamic ileus. Similar findings noted on prior exam. Continued follow-up exam suggested to demonstrate clearing. Electronically Signed   By: Marcello Moores  Register   On: 08/24/2018 09:19   Dg Abd 1 View  Result Date: 08/23/2018 CLINICAL  DATA:  Small bowel obstruction. EXAM: ABDOMEN - 1 VIEW COMPARISON:  Radiographs of August 22, 2018. FINDINGS: Mildly improved small bowel dilatation is noted concerning for ileus or distal small bowel obstruction. No definite colonic dilatation is noted. No abnormal calcifications are noted. IMPRESSION: Mildly improved small bowel dilatation is noted suggesting improving ileus or distal small bowel obstruction. Continued radiographic follow-up is recommended. Electronically Signed   By: Marijo Conception, M.D.   On: 08/23/2018 10:45   Dg  Abd 1 View  Result Date: 08/22/2018 CLINICAL DATA:  Abdominal distention, history of renal cell carcinoma EXAM: ABDOMEN - 1 VIEW COMPARISON:  Abdomen films of 08/21/2017 and CT abdomen pelvis of 08/17/2018 FINDINGS: There is slightly more gaseous distention of small bowel loops consistent with small bowel obstruction. Very little colonic bowel gas is noted. No opaque calculi are seen. There are degenerative changes in the lower lumbar spine. IMPRESSION: Worsening gaseous distention of small bowel consistent with increasing small bowel obstruction. Electronically Signed   By: Ivar Drape M.D.   On: 08/22/2018 09:25   Dg Abd 1 View  Result Date: 08/21/2018 CLINICAL DATA:  Abdominal pain and nausea EXAM: ABDOMEN - 1 VIEW COMPARISON:  08/18/2018 FINDINGS: Scattered large and small bowel gas is noted. Persistent small bowel dilatation is noted similar to that seen on the prior exam consistent with partial small bowel obstruction. No acute bony abnormality is noted. No other focal abnormality is seen. IMPRESSION: Persistent changes of small-bowel obstruction. Electronically Signed   By: Inez Catalina M.D.   On: 08/21/2018 14:41   Dg Abd 1 View  Result Date: 08/09/2018 CLINICAL DATA:  Renal cell carcinoma with abdominal pain EXAM: ABDOMEN - 1 VIEW COMPARISON:  CT abdomen and pelvis August 07, 2018 FINDINGS: Contrast is seen in the colon. There is no bowel dilatation or  air-fluid level to suggest bowel obstruction. No free air. Visualized lung bases are clear. No abnormal calcifications. IMPRESSION: No evident bowel obstruction or free air.  Lung bases clear. Electronically Signed   By: Lowella Grip III M.D.   On: 08/09/2018 13:53   Ct Abdomen W Contrast  Result Date: 08/10/2018 CLINICAL DATA:  History metastatic renal cell carcinoma. Duodenitis. Abdominal pain. EXAM: CT ABDOMEN WITH CONTRAST TECHNIQUE: Multidetector CT imaging of the abdomen was performed using the standard protocol following bolus administration of intravenous contrast. CONTRAST:  146mL OMNIPAQUE IOHEXOL 300 MG/ML  SOLN COMPARISON:  CT abdomen pelvis 08/07/2018 FINDINGS: Lower chest: Normal heart size. Small layering bilateral pleural effusions. Dependent atelectasis within the bilateral lower lobes. Stable paraesophageal lymph node measuring 2.0 cm (image 8; series 2), previously 2.2 cm. Hepatobiliary: Liver is normal in size and contour. Multiple stones within the gallbladder lumen. No gallbladder wall thickening. Stable perihepatic ascites. Unchanged 11 mm low-attenuation lesion right hepatic lobe (image 16; series 2). Pancreas: Peripancreatic fluid.  Pancreatic parenchyma enhances. Spleen: Unremarkable Adrenals/Urinary Tract: Stable left adrenal gland nodularity. Right adrenal gland is normal. Unchanged complex mass within the inferior pole of the right kidney measuring 10.3 x 9.0 cm. Simple cyst superior pole left kidney. No hydronephrosis. Stomach/Bowel: Normal morphology of the stomach. Wall thickening of the second-third portions of the duodenum. No evidence for small bowel obstruction. No free intraperitoneal air. Vascular/Lymphatic: Normal caliber abdominal aorta. Peripheral calcified atherosclerotic plaque. Stable retroperitoneal adenopathy including a 1.6 cm aortocaval lymph node (image 49; series 2) and a 1.2 cm left periaortic lymph node (image 54; series 2). Other: Small volume ascites.  No  free intraperitoneal air. Musculoskeletal: Similar-appearing lytic lesions of the L1 and L4 vertebral bodies. IMPRESSION: 1. Similar-appearing wall thickening of the second third portion of the duodenum raising the possibility of duodenitis. 2. There is peripancreatic fluid which may be secondary to duodenitis and reactive fluid. Possibility of pancreatitis not excluded. 3. Similar-appearing heterogeneous solid and cystic mass inferior pole right kidney, most compatible with renal cell carcinoma. 4. Similar-appearing retroperitoneal and retrocrural adenopathy. 5. Stable lytic osseous lesions. 6. Small bilateral pleural effusions and underlying opacities favored  to represent atelectasis. Electronically Signed   By: Lovey Newcomer M.D.   On: 08/10/2018 13:16   Mr Abdomen W Wo Contrast  Result Date: 08/04/2018 EXAM: MRI ABDOMEN WITHOUT AND WITH CONTRAST TECHNIQUE: Multiplanar multisequence MR imaging of the abdomen was performed both before and after the administration of intravenous contrast. CONTRAST:  78mL MULTIHANCE GADOBENATE DIMEGLUMINE 529 MG/ML IV SOLN COMPARISON:  CT 08/01/2018, PET-CT 04/28/2018 FINDINGS: Lower chest:  Small bilateral pleural effusions. Hepatobiliary: Lesion of concern in the RIGHT hepatic lobe is high signal intensity on T2 weighted imaging measuring 10 mm (image 18/5). There is questionable mild peripheral enhancement of this lesion on delayed imaging (image 34/1005). Regardless lesions most consistent with a benign cyst or small hemangioma. The liver parenchyma has low signal intensity on the inphase imaging and low signal intensity on T2 weighted imaging suggesting iron deposition. Gallstones within the gallbladder.  No evidence acute cholecystitis. Pancreas: Normal pancreatic parenchymal intensity. No ductal dilatation or inflammation. Spleen: Low signal intensity within the spleen on T2 weighted imaging additionally Adrenals/urinary tract: Adrenal glands normal. Large enhancing mass in  the lower pole of the RIGHT kidney measuring 9.2 by 9.0 cm and unchanged. Again noted retroperitoneal periaortic lymphadenopathy and retrocrural adenopathy as described on recent CT abdomen pelvis 08/01/2018. Stomach/Bowel: Stomach is normal. The tumor does abut the distal second portion of the duodenum but no direct involvement. Mild thickening of the duodenum and third portion is described on comparison CT. Vascular/Lymphatic: Retroperitoneal Lymphadenopathy retrocrural adenopathy again noted. Musculoskeletal: No metastatic lesion identified. Iliac bone lesions not imaged IMPRESSION: 1. Lesion in the RIGHT hepatic lobe of concern on comparison CT isconsistent with either a benign cyst or small hemangioma. No evidence of hepatic metastasis. 2. Stable RIGHT renal mass as described on recent CT 2 days prior. 3. Periaortic retroperitoneal adenopathy and retrocrural adenopathy as described on recent CT. 4. No evidence of direct extension into the duodenum. 5. Cholelithiasis. 6. Iron deposition in the liver and spleen suggests transfusion therapy. Electronically Signed   By: Suzy Bouchard M.D.   On: 08/04/2018 11:56   Ct Abdomen Pelvis W Contrast  Result Date: 08/17/2018 CLINICAL DATA:  Metastatic renal cell carcinoma. Jaundice and abdominal distention. Duodenitis. Pancreatitis. EXAM: CT ABDOMEN AND PELVIS WITH CONTRAST TECHNIQUE: Multidetector CT imaging of the abdomen and pelvis was performed using the standard protocol following bolus administration of intravenous contrast. CONTRAST:  178mL OMNIPAQUE IOHEXOL 300 MG/ML  SOLN COMPARISON:  Multiple exams, including 08/10/2018 and MRI abdomen from 08/14/2018 FINDINGS: Lower chest: We partially image a centrally necrotic subcarinal lymph node measuring about 1.8 cm in short axis on image 1/2. Suspected left infrahilar node, 1 point 4 cm in short axis also on the top most image. Lower paraesophageal lymph node 2.1 cm in short axis on image 8/2, stable.  Hepatobiliary: Stable intrahepatic biliary dilatation extending to the confluence of the right and left bile ducts. Attenuated proximal common hepatic duct and common bile duct, poorly seen, cannot exclude wall thickening. Contracted gallbladder with mild gallbladder wall thickening. Two gallstones are present, the larger 1.7 cm in diameter. Hypodense 1.2 cm right hepatic lobe lesion on image 18/2, nonspecific, possibly a metastatic lesion, not appreciably changed from the exam from 3 days ago. Pancreas: There is fluid along some of the margins the pancreas including in the lesser sac region which could be a manifestation of pancreatitis. No dorsal pancreatic duct dilatation, pancreatic necrosis, pancreatic pseudocyst, or abscess. Spleen: Unremarkable Adrenals/Urinary Tract: Subtle thickening of the left adrenal gland merits  observation. Exophytic 9.7 cm solid mass of the right kidney lower pole not appreciably changed from 08/14/2018, compatible with renal cell carcinoma. No appreciable tumor thrombus in the right renal vein. Stomach/Bowel: Distended stomach. Dilated loops of proximal small bowel extending to a transition in the vicinity of image 56/2 just to the right of midline where the bowel is no longer dilated but there is no obvious cause for obstruction. There is some stranding around the duodenum and possible mild proximal duodenitis, with wall thickening in the duodenal bulb as on image 34/2. Vascular/Lymphatic: Aortoiliac atherosclerotic vascular disease. No appreciable tumor thrombus in the right renal vein currently. Notable retrocrural and retroperitoneal adenopathy. A left periaortic node measures 2.1 cm in short axis on image 47/2. Reproductive: Unremarkable Other: Scattered moderate ascites. There is infiltration of the omentum primarily by fluid which appears increased compared to 08/10/2018, for example in the right upper quadrant. I do not see definite tumor nodularity in the omentum or along  the ascites, although peritoneal spread of tumor can be a cause for omental stranding and infiltration. Subcutaneous edema is present. Musculoskeletal: Large lytic metastatic lesions with associated compression fractures at L2 and L4, no change from 08/10/2018. Small lytic lesions in the left iliac bone are likewise stable from 08/07/2018. IMPRESSION: 1. Dilated loops of proximal small bowel without an obvious cause. There a transition in the mid small-bowel between dilated and nondilated loops. The appearance could be due to ileus perhaps related to pancreatitis. Moderately distended stomach. 2. Continued wall thickening in the proximal duodenum compatible with duodenitis. 3. Increase in fluid along the lesser sac and mild increase in ascites. There is also increased infiltrative edema in the omentum which could be inflammatory or less likely due to peritoneal spread of tumor. 4. Stable degree of intrahepatic biliary dilatation. The extrahepatic biliary tree is markedly narrowed, possibly due to extrinsic narrowing related to pancreatitis, or possibly from cholangitis. 5. Gallstones are observed. The right kidney lower pole mass, metastatic adenopathy, and osseous metastatic disease all appear stable compared to recent CT scans. 6. There is no evidence of pancreatic abscess, necrosis, or pseudocyst. 7. Subcarinal and left infrahilar adenopathy appears stable. 8. 1.2 cm right hepatic lobe lesion. This was previously much larger and hypermetabolic on remote prior PET-CT but more recently the hypermetabolic activity had resolved and the lesion was notably smaller than on 01/03/2017. 9. There is some mild thickening of the left adrenal gland which merits surveillance. Electronically Signed   By: Van Clines M.D.   On: 08/17/2018 11:33   Ct Abdomen Pelvis W Contrast  Result Date: 08/07/2018 CLINICAL DATA:  Generalized abdominal pain and bloating with constipation. One episode of emesis after discharge. EXAM:  CT ABDOMEN AND PELVIS WITH CONTRAST TECHNIQUE: Multidetector CT imaging of the abdomen and pelvis was performed using the standard protocol following bolus administration of intravenous contrast. CONTRAST:  136mL ISOVUE-300 IOPAMIDOL (ISOVUE-300) INJECTION 61%, 63mL OMNIPAQUE IOHEXOL 300 MG/ML SOLN COMPARISON:  08/01/2018 CT, MRI 08/03/2018 of the liver FINDINGS: Lower chest: Redemonstration of subcarinal adenopathy measuring up to 2.1 cm in short axis. The tip of a port catheter is seen in the SVC-RA juncture. Stable low-density paraesophageal lymph node with central necrosis measuring 2.2 cm short axis is stable. Slight increase in small right effusion. Hepatobiliary: Stable perihepatic ascites with periportal edema. Nondistended gallbladder with gallstones present. No significant extrahepatic biliary dilatation. Patent main portal veins. Small 1.1 cm right hepatic hypodensity consistent with a benign finding by recent MRI, series 2/14. Pancreas: No  pancreatic mass or ductal dilatation. Spleen: Normal size spleen without mass. Adrenals/Urinary Tract: Normal right adrenal gland. Tiny hypodense nodule measuring 6 mm in the left adrenal apex. Again noted is a large hypodense 9.7 x 8.8 cm (series 2/46) heterogeneous mass arising from the mid to lower pole of the right kidney with septations, hypoattenuation and calcifications unchanged from recent comparison. Simple cyst arising from the upper pole the left kidney is unchanged measuring 2.9 cm. No nephrolithiasis. The urinary bladder is decompressed slightly thick-walled as a result. No focal mural thickening or calculus. Stomach/Bowel: Contrast distended stomach without focal mural thickening or gastric fold thickening. Thickened second and third portion of the duodenum as before suspicious for duodenitis. The jejunum is nondistended and noninflamed. The distal and terminal ileum are unremarkable. The colon is stool-filled without obstruction or inflammation. The  appendix is not confidently identified. Vascular/Lymphatic: Nonaneurysmal aorta. Patent branch vessels. Stable 2 cm left para-aortic lymphadenopathy, series 2/44. Additional retroperitoneal adenopathy is stable. Retrocrural adenopathy is also noted, the largest is on the right measuring 1.1 cm short axis and also stable, series 2/21. Reproductive: Small prostate Other: Small volume perihepatic, perisplenic and pelvic ascites. Musculoskeletal: Lytic bone lesions of L1 and L4 with degenerative disc disease L5-S1 is redemonstrated. Lytic lesions of the iliac bones remain stable as well left measuring 10 mm and on the right 21 mm. IMPRESSION: 1. Redemonstration of transmural thickening of the second and third portion of the duodenum suggestive of a duodenitis. There has been some interval decrease in thickening suggested however. 2. Unchanged right dominant cystic mass and with septations and calcifications noted of the right kidney measuring at up to 9.7 cm. 3. Simple cyst of the left kidney measuring 2.9 cm. 4. Stable retrocrural and para-aortic lymphadenopathy. Stable subcarinal and paraesophageal adenopathy. 5. Lytic lesions of L1, L4 both iliac bones are unchanged. 6. Uncomplicated cholelithiasis. 7. 1.1 cm circumscribed hypodense lesion of the right hepatic benign features on MRI. Electronically Signed   By: Ashley Royalty M.D.   On: 08/07/2018 01:57   Ct Abdomen Pelvis W Contrast  Result Date: 08/01/2018 CLINICAL DATA:  57 year old with abdominal distension and acute abdominal pain. History of metastatic renal cell carcinoma. EXAM: CT ABDOMEN AND PELVIS WITH CONTRAST TECHNIQUE: Multidetector CT imaging of the abdomen and pelvis was performed using the standard protocol following bolus administration of intravenous contrast. CONTRAST:  17mL ISOVUE-300 IOPAMIDOL (ISOVUE-300) INJECTION 61% COMPARISON:  PET-CT 04/28/2018 FINDINGS: Lower chest: There is a low-density necrotic lymph node in the subcarinal region that  measures 2.4 cm in the short axis. There was disease in this area on the previous PET-CT. Dependent densities in the lower lobes are suggestive for atelectasis. No large pleural effusions. Hepatobiliary: There is new perihepatic ascites. 1.1 cm right hepatic lesion on sequence 2, image 16. This was not clearly present on the prior PET and could represent a new or recurrent hepatic lesion. There is periportal edema. There is edema or fluid around the gallbladder which contains multiple calcified stones. Mild gallbladder distension compared to the previous CT. There is no significant extrahepatic biliary dilatation. Main portal veins are patent. Pancreas: Unremarkable. No pancreatic ductal dilatation or surrounding inflammatory changes. Spleen: New small amount of perisplenic ascites. Otherwise, normal appearance of the spleen. Adrenals/Urinary Tract: Normal appearance of the right adrenal gland. Question mild fullness of the left adrenal gland. Stable appearance of left kidney with a cortical cyst in the upper pole. Urinary bladder is unremarkable. Again noted is a large heterogeneous mass involving the  right kidney lower pole. Mass has not significantly changed in size measuring 9.8 x 8.5 cm on sequence 2, image 42. The mass abuts the C-loop of the duodenum. Stomach/Bowel: There is diffuse wall thickening involving the transverse segment of the duodenum. Wall thickening measures roughly 1.1 cm. No evidence for wall thickening in the proximal duodenum. No gross abnormality to the stomach. Again noted is mesenteric edema in the right central aspect of the abdomen. There is no evidence for a bowel obstruction. Vascular/Lymphatic: Mild atherosclerotic disease in the aorta without aneurysm. Again noted are enlarged periaortic lymph nodes which have not significantly changed. Left periaortic lymph node on sequence 2 image 40 measures 2 cm in the short axis and recent recently measured 1.9 cm on the PET-CT. Retrocrural  lymph nodes have decreased in size. Left retrocrural lymph node on series 2, image 23 measures 1.1 cm in short axis and previously measured 2.0 cm. Reproductive: Prostate is unremarkable. Other: Small amount of pelvic ascites is new. Mild mesenteric edema. Small amount of ascites in the upper abdomen around the liver and spleen are new. Musculoskeletal: Again noted are lucent bone lesions involving L1 and L4. Lucent bone lesion involving the left ilium on sequence 2 image 66 measures 1.3 cm and recently measured 0.9 cm. Lucent lesion in the left iliac wing measures 0.9 cm and more conspicuous compared to the recent comparison examination. IMPRESSION: New wall thickening involving the third and fourth portions of the duodenum of uncertain etiology. This could be infectious or inflammatory. The primary renal tumor abuts the duodenum but tumor involvement in this area is thought to be less likely. Metastatic renal cell carcinoma with a mixed response to therapy since the recent PET-CT. Concern for a new liver lesion and enlarging bone lesions in the left ilium. In addition, there is new ascites. However, the retrocrural lymph nodes have decreased in size. No significant change in the primary right renal lesion. Cholelithiasis with a small amount of pericholecystic edema or fluid. This pericholecystic fluid could be related to the ascites but limited evaluation for cholecystitis. If there is clinical concern for cholecystitis, recommend further characterization with a right upper quadrant ultrasound. Electronically Signed   By: Markus Daft M.D.   On: 08/01/2018 13:58   Mr 3d Recon At Scanner  Result Date: 08/14/2018 CLINICAL DATA:  Renal cell carcinoma.  Abdominal distention. EXAM: MRI ABDOMEN WITHOUT AND WITH CONTRAST (INCLUDING MRCP) TECHNIQUE: Multiplanar multisequence MR imaging of the abdomen was performed both before and after the administration of intravenous contrast. Heavily T2-weighted images of the biliary  and pancreatic ducts were obtained, and three-dimensional MRCP images were rendered by post processing. CONTRAST:  10 cc of Gadavist COMPARISON:  08/07/2018 FINDINGS: Lower chest: Small bilateral pleural effusions identified. Hepatobiliary: Mild T2 hyperintense and T1 hypointense structure within right lobe of liver measures 1 cm, image 41/1100 1. No additional focal liver abnormalities identified. Stones are identified within the gallbladder. The largest measures 1.6 cm. There is mild diffuse gallbladder wall thickening. Moderate to marked intrahepatic bile duct dilatation identified. The intrahepatic ducts are dilated to the level of the central confluence, image 118/6 and image number 72/12. No discrete mass identified. The common bile duct is relatively normal in caliber measuring 6 mm with diffuse mural enhancement. The caliber of the common bile duct becomes diminutive at the level of the head of pancreas. No choledocholithiasis identified. Pancreas: Mild diffuse pancreatic edema and peripancreatic fat stranding and free fluid is noted compatible with acute pancreatitis. No pancreatic  mass identified. No evidence for pancreatic necrosis or pseudocyst formation. Spleen:  Within normal limits in size and appearance. Adrenals/Urinary Tract: Normal appearance of the right adrenal gland. Small left adrenal gland nodule measuring 8 mm is nonspecific. Simple appearing cyst arises from upper pole of left kidney measuring 2.8 cm. Large complex enhancing solid mass arising from inferior pole of right kidney is again noted measuring 10 cm, image 95/1101, compatible with a renal cell carcinoma. Small right renal veins appear patent. No tumor thrombus identified within the IVC. Stomach/Bowel: The stomach is nondistended. Wall thickening involving the second and third portions of the duodenum identified. Vascular/Lymphatic: Normal appearance of the abdominal aorta. Enlarged abdominal lymph nodes are identified compatible  with metastatic adenopathy. Index left retroperitoneal node measures 1.8 cm, image 89/1100 1. Prominent bilateral retrocrural lymph nodes are also noted and worrisome for metastatic disease. Index node on the right measures 1.2 cm, image 44/1100 1. Within the chest there is an enlarged subcarinal node measuring 2.4 cm. Other: Moderate volume of ascites identified within the upper abdomen. Increased in volume from recent CT. Musculoskeletal: Enhancing bone lesions are identified involving L1 and L4 vertebra. IMPRESSION: 1. Imaging findings compatible with acute pancreatitis. No evidence for pancreatic pseudocyst or necrosis. 2. There is abnormal increase caliber of the intrahepatic ducts to the level of the confluence with a relative normal caliber common bile duct. Enhancement of the common bile duct is identified, nonspecific but may reflect cholangitis. No choledocholithiasis or obstructing mass noted. 3. Gallstones and diffuse gallbladder wall thickening with enhancement. Cannot rule out cholecystitis. 4. Large right kidney mass compatible with renal cell carcinoma. There is evidence of nodal metastasis within the subcarinal region of the mediastinum, retrocrural stations and upper abdomen. Enhancing lesions within the lumbar spine compatible with bone metastases. 5. Ascites.  Increased from previous exam. 6. Wall thickening of the second and third portions of the duodenum which may reflect duodenitis. This may reflect primary duodenitis or secondary inflammation due to pancreatitis. Electronically Signed   By: Kerby Moors M.D.   On: 08/14/2018 21:33   Dg Abd 2 Views  Result Date: 08/18/2018 CLINICAL DATA:  Small bowel obstruction EXAM: ABDOMEN - 2 VIEW COMPARISON:  CT abdomen and pelvis August 17, 2018 FINDINGS: Supine and upright images were obtained. There remain loops of dilated small bowel with multiple air-fluid levels. There is only a small amount of air in the colon. There is no demonstrable  free air. No abnormal calcifications. Lung bases clear. IMPRESSION: Small bowel dilatation with multiple air-fluid levels, a pattern indicative of persistent small bowel obstruction. No free air. Lung bases clear. Electronically Signed   By: Lowella Grip III M.D.   On: 08/18/2018 09:43   Dg Abd Acute W/chest  Result Date: 08/06/2018 CLINICAL DATA:  Abdominal pain EXAM: DG ABDOMEN ACUTE W/ 1V CHEST COMPARISON:  None. FINDINGS: Port in the anterior chest wall with tip in distal SVC. Normal mediastinum and cardiac silhouette. Normal pulmonary vasculature. No evidence of effusion, infiltrate, or pneumothorax. No acute bony abnormality. Moderate volume stool in the ascending colon. Gallstones noted. No dilated large or small bowel. Large RIGHT renal mass in the RIGHT lower quadrant not well appreciated. IMPRESSION: 1. No acute findings in the chest or abdomen. 2. Moderate volume stool in the ascending colon. 3. Large 10 cm RIGHT renal mass demonstrated on CT 08/01/2018 not appreciated. Electronically Signed   By: Suzy Bouchard M.D.   On: 08/06/2018 08:26   Mr Abdomen Mrcp W Wo Contast  Result Date: 08/14/2018 CLINICAL DATA:  Renal cell carcinoma.  Abdominal distention. EXAM: MRI ABDOMEN WITHOUT AND WITH CONTRAST (INCLUDING MRCP) TECHNIQUE: Multiplanar multisequence MR imaging of the abdomen was performed both before and after the administration of intravenous contrast. Heavily T2-weighted images of the biliary and pancreatic ducts were obtained, and three-dimensional MRCP images were rendered by post processing. CONTRAST:  10 cc of Gadavist COMPARISON:  08/07/2018 FINDINGS: Lower chest: Small bilateral pleural effusions identified. Hepatobiliary: Mild T2 hyperintense and T1 hypointense structure within right lobe of liver measures 1 cm, image 41/1100 1. No additional focal liver abnormalities identified. Stones are identified within the gallbladder. The largest measures 1.6 cm. There is mild diffuse  gallbladder wall thickening. Moderate to marked intrahepatic bile duct dilatation identified. The intrahepatic ducts are dilated to the level of the central confluence, image 118/6 and image number 72/12. No discrete mass identified. The common bile duct is relatively normal in caliber measuring 6 mm with diffuse mural enhancement. The caliber of the common bile duct becomes diminutive at the level of the head of pancreas. No choledocholithiasis identified. Pancreas: Mild diffuse pancreatic edema and peripancreatic fat stranding and free fluid is noted compatible with acute pancreatitis. No pancreatic mass identified. No evidence for pancreatic necrosis or pseudocyst formation. Spleen:  Within normal limits in size and appearance. Adrenals/Urinary Tract: Normal appearance of the right adrenal gland. Small left adrenal gland nodule measuring 8 mm is nonspecific. Simple appearing cyst arises from upper pole of left kidney measuring 2.8 cm. Large complex enhancing solid mass arising from inferior pole of right kidney is again noted measuring 10 cm, image 95/1101, compatible with a renal cell carcinoma. Small right renal veins appear patent. No tumor thrombus identified within the IVC. Stomach/Bowel: The stomach is nondistended. Wall thickening involving the second and third portions of the duodenum identified. Vascular/Lymphatic: Normal appearance of the abdominal aorta. Enlarged abdominal lymph nodes are identified compatible with metastatic adenopathy. Index left retroperitoneal node measures 1.8 cm, image 89/1100 1. Prominent bilateral retrocrural lymph nodes are also noted and worrisome for metastatic disease. Index node on the right measures 1.2 cm, image 44/1100 1. Within the chest there is an enlarged subcarinal node measuring 2.4 cm. Other: Moderate volume of ascites identified within the upper abdomen. Increased in volume from recent CT. Musculoskeletal: Enhancing bone lesions are identified involving L1 and  L4 vertebra. IMPRESSION: 1. Imaging findings compatible with acute pancreatitis. No evidence for pancreatic pseudocyst or necrosis. 2. There is abnormal increase caliber of the intrahepatic ducts to the level of the confluence with a relative normal caliber common bile duct. Enhancement of the common bile duct is identified, nonspecific but may reflect cholangitis. No choledocholithiasis or obstructing mass noted. 3. Gallstones and diffuse gallbladder wall thickening with enhancement. Cannot rule out cholecystitis. 4. Large right kidney mass compatible with renal cell carcinoma. There is evidence of nodal metastasis within the subcarinal region of the mediastinum, retrocrural stations and upper abdomen. Enhancing lesions within the lumbar spine compatible with bone metastases. 5. Ascites.  Increased from previous exam. 6. Wall thickening of the second and third portions of the duodenum which may reflect duodenitis. This may reflect primary duodenitis or secondary inflammation due to pancreatitis. Electronically Signed   By: Kerby Moors M.D.   On: 08/14/2018 21:33   [name] Ascites (abdomen Limited)  Result Date: 08/22/2018 CLINICAL DATA:  Ascites. EXAM: LIMITED ABDOMEN ULTRASOUND FOR ASCITES TECHNIQUE: Limited ultrasound survey for ascites was performed in all four abdominal quadrants. COMPARISON:  CT 08/17/2018. FINDINGS:  Mild amount of ascites demonstrated. IMPRESSION: Mild amount of ascites. Electronically Signed   By: Marcello Moores  Register   On: 08/22/2018 14:50    Discharge Exam: Vitals:   08/27/18 2117 08/28/18 0456  BP: 128/79 123/88  Pulse: 84 86  Resp: 18 18  Temp: 99 F (37.2 C) 98.1 F (36.7 C)  SpO2: 96% 99%   Vitals:   08/27/18 1151 08/27/18 2117 08/28/18 0456 08/28/18 0512  BP: 114/65 128/79 123/88   Pulse: 78 84 86   Resp: 16 18 18    Temp: (!) 97.3 F (36.3 C) 99 F (37.2 C) 98.1 F (36.7 C)   TempSrc: Oral Oral Oral   SpO2: 98% 96% 99%   Weight:    103.6 kg  Height:         General: Pt is alert, awake, not in acute distress Cardiovascular: RRR, S1/S2 +, no rubs, no gallops Respiratory: CTA bilaterally, no wheezing, no rhonchi Abdominal: Soft, NT, ND, bowel sounds + Extremities: b/l LE edema 1+  The results of significant diagnostics from this hospitalization (including imaging, microbiology, ancillary and laboratory) are listed below for reference.     Microbiology: No results found for this or any previous visit (from the past 240 hour(s)).   Labs: BNP (last 3 results) No results for input(s): BNP in the last 8760 hours. Basic Metabolic Panel: Recent Labs  Lab 08/22/18 0320  08/24/18 0442 08/25/18 0428 08/26/18 0335 08/27/18 0457 08/28/18 0328  NA 137   < > 137 139 138 138 138  K 3.8   < > 4.1 4.0 3.9 3.8 3.7  CL 99   < > 100 100 99 99 100  CO2 28   < > 29 29 31 31 30   GLUCOSE 107*   < > 132* 120* 110* 136* 118*  BUN 12   < > 16 16 12 13 14   CREATININE 0.77   < > 0.80 0.73 0.69 0.76 0.70  CALCIUM 8.6*   < > 8.4* 8.9 8.3* 8.1* 8.1*  MG 2.1  --  2.0 2.0  --   --   --    < > = values in this interval not displayed.   Liver Function Tests: Recent Labs  Lab 08/24/18 0442 08/25/18 0428 08/26/18 0335 08/27/18 0457 08/28/18 0328  AST 23 30 30 26 29   ALT 39 40 36 34 34  ALKPHOS 571* 657* 590* 522* 529*  BILITOT 2.6* 3.0* 2.4* 2.1* 1.8*  PROT 5.2* 5.6* 5.0* 5.1* 5.1*  ALBUMIN 2.0* 2.3* 2.1* 2.0* 2.0*   No results for input(s): LIPASE, AMYLASE in the last 168 hours. No results for input(s): AMMONIA in the last 168 hours. CBC: Recent Labs  Lab 08/24/18 0442 08/25/18 0959 08/26/18 0335 08/27/18 0457 08/28/18 0328  WBC 10.1 13.1* 11.9* 13.1* 14.3*  NEUTROABS 8.6* 10.4* 10.2* 11.5* 12.5*  HGB 9.2* 10.5* 9.8* 9.4* 9.5*  HCT 29.1* 33.9* 30.8* 30.0* 30.3*  MCV 82.9 83.5 83.0 83.8 84.6  PLT 364 503* 442* 391 422*   Cardiac Enzymes: No results for input(s): CKTOTAL, CKMB, CKMBINDEX, TROPONINI in the last 168 hours. BNP: Invalid  input(s): POCBNP CBG: Recent Labs  Lab 08/27/18 1640 08/27/18 2121 08/28/18 0029 08/28/18 0500 08/28/18 1200  GLUCAP 132* 94 89 120* 72   D-Dimer No results for input(s): DDIMER in the last 72 hours. Hgb A1c No results for input(s): HGBA1C in the last 72 hours. Lipid Profile No results for input(s): CHOL, HDL, LDLCALC, TRIG, CHOLHDL, LDLDIRECT in the last 72 hours.  Thyroid function studies No results for input(s): TSH, T4TOTAL, T3FREE, THYROIDAB in the last 72 hours.  Invalid input(s): FREET3 Anemia work up No results for input(s): VITAMINB12, FOLATE, FERRITIN, TIBC, IRON, RETICCTPCT in the last 72 hours. Urinalysis    Component Value Date/Time   COLORURINE AMBER (A) 08/01/2018 1107   APPEARANCEUR CLEAR 08/01/2018 1107   LABSPEC 1.021 08/01/2018 1107   PHURINE 5.0 08/01/2018 1107   GLUCOSEU 50 (A) 08/01/2018 1107   HGBUR SMALL (A) 08/01/2018 1107   BILIRUBINUR MODERATE (A) 08/01/2018 1107   KETONESUR 80 (A) 08/01/2018 1107   PROTEINUR 30 (A) 08/01/2018 1107   NITRITE NEGATIVE 08/01/2018 1107   LEUKOCYTESUR SMALL (A) 08/01/2018 1107   Sepsis Labs Invalid input(s): PROCALCITONIN,  WBC,  LACTICIDVEN Microbiology No results found for this or any previous visit (from the past 240 hour(s)).   Time coordinating discharge: 35 minutes  SIGNED:  Chipper Oman, MD  [facility] 08/28/2018, 12:45 PM  Pager please text page via  [email or web address]  Note - This record has been created using [employer]. Chart creation errors have been sought, but may not always have been located. Such creation errors do not reflect on the standard of medical care.

## 2018-08-30 ENCOUNTER — Encounter: Payer: Self-pay | Admitting: Physician Assistant

## 2018-08-30 ENCOUNTER — Ambulatory Visit (INDEPENDENT_AMBULATORY_CARE_PROVIDER_SITE_OTHER): Payer: BLUE CROSS/BLUE SHIELD | Admitting: Physician Assistant

## 2018-08-30 VITALS — BP 124/84 | HR 99 | Temp 98.4°F | Wt 224.9 lb

## 2018-08-30 DIAGNOSIS — E119 Type 2 diabetes mellitus without complications: Secondary | ICD-10-CM | POA: Insufficient documentation

## 2018-08-30 DIAGNOSIS — K5903 Drug induced constipation: Secondary | ICD-10-CM | POA: Insufficient documentation

## 2018-08-30 DIAGNOSIS — R180 Malignant ascites: Secondary | ICD-10-CM | POA: Diagnosis not present

## 2018-08-30 DIAGNOSIS — T402X5A Adverse effect of other opioids, initial encounter: Secondary | ICD-10-CM

## 2018-08-30 DIAGNOSIS — D7289 Other specified disorders of white blood cells: Secondary | ICD-10-CM | POA: Insufficient documentation

## 2018-08-30 DIAGNOSIS — E440 Moderate protein-calorie malnutrition: Secondary | ICD-10-CM

## 2018-08-30 DIAGNOSIS — R609 Edema, unspecified: Secondary | ICD-10-CM | POA: Diagnosis not present

## 2018-08-30 DIAGNOSIS — R748 Abnormal levels of other serum enzymes: Secondary | ICD-10-CM

## 2018-08-30 DIAGNOSIS — T380X5A Adverse effect of glucocorticoids and synthetic analogues, initial encounter: Secondary | ICD-10-CM

## 2018-08-30 DIAGNOSIS — D638 Anemia in other chronic diseases classified elsewhere: Secondary | ICD-10-CM | POA: Insufficient documentation

## 2018-08-30 DIAGNOSIS — K2980 Duodenitis without bleeding: Secondary | ICD-10-CM

## 2018-08-30 LAB — GLUCOSE, CAPILLARY: Glucose-Capillary: 128 mg/dL — ABNORMAL HIGH (ref 70–99)

## 2018-08-30 MED ORDER — FUROSEMIDE 20 MG PO TABS: 20.0000 mg | ORAL_TABLET | Freq: Two times a day (BID) | ORAL | 0 refills | Status: DC

## 2018-08-30 NOTE — Patient Instructions (Addendum)
Increase your Lasix to 40 mg daily - you can do this in divided doses (1 tab twice a day) or you can take 2 tabs once per day Continue to elevate your legs above level of your heart  Prediabetes Eating Plan Prediabetes-also called impaired glucose tolerance or impaired fasting glucose-is a condition that causes blood sugar (blood glucose) levels to be higher than normal. Following a healthy diet can help to keep prediabetes under control. It can also help to lower the risk of type 2 diabetes and heart disease, which are increased in people who have prediabetes. Along with regular exercise, a healthy diet:  Promotes weight loss.  Helps to control blood sugar levels.  Helps to improve the way that the body uses insulin.  What do I need to know about this eating plan?  Use the glycemic index (GI) to plan your meals. The index tells you how quickly a food will raise your blood sugar. Choose low-GI foods. These foods take a longer time to raise blood sugar.  Pay close attention to the amount of carbohydrates in the food that you eat. Carbohydrates increase blood sugar levels.  Keep track of how many calories you take in. Eating the right amount of calories will help you to achieve a healthy weight. Losing about 7 percent of your starting weight can help to prevent type 2 diabetes.  You may want to follow a Mediterranean diet. This diet includes a lot of vegetables, lean meats or fish, whole grains, fruits, and healthy oils and fats. What foods can I eat? Grains Whole grains, such as whole-wheat or whole-grain breads, crackers, cereals, and pasta. Unsweetened oatmeal. Bulgur. Barley. Quinoa. Brown rice. Corn or whole-wheat flour tortillas or taco shells. Vegetables Lettuce. Spinach. Peas. Beets. Cauliflower. Cabbage. Broccoli. Carrots. Tomatoes. Squash. Eggplant. Herbs. Peppers. Onions. Cucumbers. Brussels sprouts. Fruits Berries. Bananas. Apples. Oranges. Grapes. Papaya. Mango. Pomegranate.  Kiwi. Grapefruit. Cherries. Meats and Other Protein Sources Seafood. Lean meats, such as chicken and [place] or lean cuts of pork and beef. Tofu. Eggs. Nuts. Beans. Dairy Low-fat or fat-free dairy products, such as yogurt, cottage cheese, and cheese. Beverages Water. Tea. Coffee. Sugar-free or diet soda. Seltzer water. Milk. Milk alternatives, such as soy or almond milk. Condiments Mustard. Relish. Low-fat, low-sugar ketchup. Low-fat, low-sugar barbecue sauce. Low-fat or fat-free mayonnaise. Sweets and Desserts Sugar-free or low-fat pudding. Sugar-free or low-fat ice cream and other frozen treats. Fats and Oils Avocado. Walnuts. Olive oil. The items listed above may not be a complete list of recommended foods or beverages. Contact your dietitian for more options. What foods are not recommended? Grains Refined white flour and flour products, such as bread, pasta, snack foods, and cereals. Beverages Sweetened drinks, such as sweet iced tea and soda. Sweets and Desserts Baked goods, such as cake, cupcakes, pastries, cookies, and cheesecake. The items listed above may not be a complete list of foods and beverages to avoid. Contact your dietitian for more information. This information is not intended to replace advice given to you by your health care provider. Make sure you discuss any questions you have with your health care provider. Document Released: 04/08/2015 Document Revised: 04/29/2016 Document Reviewed: 12/18/2014 Elsevier Interactive Patient Education  2017 [employer].

## 2018-08-30 NOTE — Progress Notes (Signed)
HPI:                                                                Alexander Fry is a 57 y.o. male who presents to [facility]: [facility] today for hospital discharge follow-up  Alexander Fry is a very pleasant 58 yo M with metastatic renal cell carcinoma, transaminitis, drug-induced jaundice, right IJ thrombus, and malnutrition who was admitted to [facility] 08/06/18-08/28/18 for acute pancreatitis and duodenitis. He was very ill and developed an ileus from opioid pain medication, peripheral edema and ascites. He also has a new diagnosis of type 2 diabetes, A1C 6.1 4 weeks ago.   Reports he has had 3 bowel movements today. States he feels very bloated, uncomfortable and gassy. He had some nausea yesterday without vomiting.  His legs are still very swollen. He is having difficulty ambulating. He has tried wearing his compression stockings but even the XL size cause a lot of pain. He is elevating his legs most of the time at home. Currently taking Lasix 20 mg.   He is feeling overwhelmed managing his medications.  He is concerned that he is being tapered off of Decadron. He missed a dose yesterday and noticed worsening back and abdominal pain. He is worried that without steroids is pain will not be controlled.   He has a follow-up appointment with oncology tomorrow.    Past Medical History:  Diagnosis Date  . Bilateral primary osteoarthritis of knee 04/03/2018  . Duodenitis   . Goals of care, counseling/discussion 12/22/2016  . History of radiation therapy 02/11/2017   SRT right posterior frontal 12 mm target 20 Gy, Right anterior frontal 5mm 20 Gy  . History of radiation therapy 02/21/2017   SRT Right post parietal 4mm target 20 Gy, left Post parietal 3mm 20 Gy, Right Occipital 4 mm 20Gy, Left Occipital 4 mm 20 Gy  . History of radiation therapy 02/25/2017, 02/28/2017, 03/02/2017   SRT L1 spine 27 Gy 3 fractions, L4 Spine 27 Gy 3 fractions  . History of  radiation therapy 06/07/2017   SRS- Brain  . History of radiation therapy 04/03/2018   Right ilium, 8 Gy in 1 fraction for a total dose of 8 Gy  . Inguinal hernia of left side without obstruction or gangrene   . Iron deficiency anemia due to chronic blood loss 05/23/2018  . Pneumonia    left lung  . Renal cell carcinoma, right (HCC) 01/05/2017  . 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 His vision has "spots" at times   Past Surgical History:  Procedure Laterality Date  . BIOPSY  08/02/2018   Procedure: BIOPSY;  Surgeon: Mann, Jyothi, MD;  Location: [facility];  Service: Endoscopy;;  . ESOPHAGOGASTRODUODENOSCOPY N/A 08/02/2018   Procedure: ESOPHAGOGASTRODUODENOSCOPY (EGD);  Surgeon: Mann, Jyothi, MD;  Location: [facility];  Service: Endoscopy;  Laterality: N/A;  . HERNIA REPAIR    . INGUINAL HERNIA REPAIR Left   . IR FLUORO GUIDE PORT INSERTION RIGHT  02/06/2018  . IR US GUIDE VASC ACCESS RIGHT  02/06/2018   Social History   Tobacco Use  . Smoking status: Never Smoker  . Smokeless tobacco: Never Used  Substance Use Topics  . Alcohol use: Yes      Comment: social use in the past   family history includes Alcohol abuse in his brother; Diabetes in his mother; Heart disease in his father and mother.    ROS: negative except as noted in the HPI  Medications: Current Outpatient Medications  Medication Sig Dispense Refill  . bisacodyl (DULCOLAX) 5 MG EC tablet Take 1 tablet (5 mg total) by mouth daily. 30 tablet 0  . dexamethasone (DECADRON) 0.5 MG tablet Take 4 tablets (2 mg total) by mouth 2 (two) times daily for 1 day, THEN 4 tablets (2 mg total) daily for 3 days, THEN 2 tablets (1 mg total) daily for 3 days, THEN 1 tablet (0.5 mg total) daily for 3 days. 29 tablet 0  . docusate sodium (COLACE) 100 MG capsule Take 1 capsule (100 mg total) by mouth 2 (two) times daily. 60 capsule 0  . fentaNYL (DURAGESIC - DOSED MCG/HR) 100 MCG/HR Place 1 patch (100 mcg total) onto the  skin every 3 (three) days for 15 days. 5 patch 0  . furosemide (LASIX) 20 MG tablet Take 1 tablet (20 mg total) by mouth daily. 30 tablet 0  . glimepiride (AMARYL) 4 MG tablet Take 1 tablet (4 mg total) by mouth daily with breakfast. 30 tablet 4  . metoCLOPramide (REGLAN) 10 MG tablet Take 1 tablet (10 mg total) by mouth 3 (three) times daily before meals. 90 tablet 0  . naloxegol oxalate (MOVANTIK) 25 MG TABS tablet Take 1 tablet (25 mg total) by mouth daily. 30 tablet 0  . pantoprazole (PROTONIX) 40 MG tablet Take 1 tablet (40 mg total) by mouth daily. 30 tablet 0  . polyethylene glycol (MIRALAX) packet Take 17 g by mouth daily. 30 packet 0  . rivaroxaban (XARELTO) 10 MG TABS tablet Take 1 tablet (10 mg total) by mouth daily with supper. (Patient taking differently: Take 20 mg by mouth daily with supper. ) 30 tablet 3  . simethicone (MYLICON) 80 MG chewable tablet Chew 1 tablet (80 mg total) by mouth 4 (four) times daily as needed for flatulence. 30 tablet 0  . HYDROmorphone (DILAUDID) 4 MG tablet Take 1 tablet (4 mg total) by mouth every 4 (four) hours as needed for moderate pain or severe pain. (Patient not taking: Reported on 08/30/2018) 90 tablet 0   No current facility-administered medications for this visit.    Allergies  Allergen Reactions  . Feraheme [Ferumoxytol] Anaphylaxis and Other (See Comments)    Flush. Patient denies anaphylaxis.       Objective:  BP 124/84 (BP Location: Left Arm, Patient Position: Sitting, Cuff Size: Normal)   Pulse 99   Temp 98.4 F (36.9 C) (Oral)   Wt 224 lb 14.4 oz (102 kg)   BMI 30.50 kg/m  Gen:  alert, he is ill-appearing, skin is jaundiced, he is non-toxic appearing, no acute distress HEENT: head normocephalic without obvious abnormality, b/l scleral icterus, wearing glasses, trachea midline Pulm: Normal work of breathing, normal phonation, clear to auscultation bilaterally, no wheezes, rales or rhonchi CV: Normal rate, regular rhythm, s1 and  s2 distinct, no murmurs, clicks or rubs  GI: abdomen distended, diffusely tender especially in RUQ and bilateral lower quadrants, ascites present Neuro: alert and oriented x 3, no tremor MSK: extremities atraumatic, slowed gait, 4+ peripheral edema bilaterally Skin: jaundiced   Results for orders placed or performed during the hospital encounter of 08/06/18 (from the past 72 hour(s))  Glucose, capillary     Status: None   Collection Time: 08/27/18 11:45 AM  Result Value Ref Range   Glucose-Capillary 93 70 - 99 mg/dL  Glucose, capillary     Status: Abnormal   Collection Time: 08/27/18  4:40 PM  Result Value Ref Range   Glucose-Capillary 132 (H) 70 - 99 mg/dL  Glucose, capillary     Status: None   Collection Time: 08/27/18  9:21 PM  Result Value Ref Range   Glucose-Capillary 94 70 - 99 mg/dL  Glucose, capillary     Status: None   Collection Time: 08/28/18 12:29 AM  Result Value Ref Range   Glucose-Capillary 89 70 - 99 mg/dL  CBC with Differential/Platelet     Status: Abnormal   Collection Time: 08/28/18  3:28 AM  Result Value Ref Range   WBC 14.3 (H) 4.0 - 10.5 K/uL   RBC 3.58 (L) 4.22 - 5.81 MIL/uL   Hemoglobin 9.5 (L) 13.0 - 17.0 g/dL   HCT 30.3 (L) 39.0 - 52.0 %   MCV 84.6 78.0 - 100.0 fL   MCH 26.5 26.0 - 34.0 pg   MCHC 31.4 30.0 - 36.0 g/dL   RDW 21.0 (H) 11.5 - 15.5 %   Platelets 422 (H) 150 - 400 K/uL   Neutrophils Relative % 87 %   Neutro Abs 12.5 (H) 1.7 - 7.7 K/uL   Lymphocytes Relative 5 %   Lymphs Abs 0.7 0.7 - 4.0 K/uL   Monocytes Relative 8 %   Monocytes Absolute 1.1 (H) 0.1 - 1.0 K/uL   Eosinophils Relative 0 %   Eosinophils Absolute 0.0 0.0 - 0.7 K/uL   Basophils Relative 0 %   Basophils Absolute 0.0 0.0 - 0.1 K/uL    Comment: Performed at [facility], [address] [place], [place] [address]  Comprehensive metabolic panel     Status: Abnormal   Collection Time: 08/28/18  3:28 AM  Result Value Ref Range   Sodium 138 135 - 145  mmol/L   Potassium 3.7 3.5 - 5.1 mmol/L   Chloride 100 98 - 111 mmol/L   CO2 30 22 - 32 mmol/L   Glucose, Bld 118 (H) 70 - 99 mg/dL   BUN 14 6 - 20 mg/dL   Creatinine, Ser 0.70 0.61 - 1.24 mg/dL   Calcium 8.1 (L) 8.9 - 10.3 mg/dL   Total Protein 5.1 (L) 6.5 - 8.1 g/dL   Albumin 2.0 (L) 3.5 - 5.0 g/dL   AST 29 15 - 41 U/L   ALT 34 0 - 44 U/L   Alkaline Phosphatase 529 (H) 38 - 126 U/L   Total Bilirubin 1.8 (H) 0.3 - 1.2 mg/dL   GFR calc non Af Amer >60 >60 mL/min   GFR calc Af Amer >60 >60 mL/min    Comment: (NOTE) The eGFR has been calculated using the CKD EPI equation. This calculation has not been validated in all clinical situations. eGFR's persistently <60 mL/min signify possible Chronic Kidney Disease.    Anion gap 8 5 - 15    Comment: Performed at [facility], [address] , [place] [address]  Glucose, capillary     Status: Abnormal   Collection Time: 08/28/18  5:00 AM  Result Value Ref Range   Glucose-Capillary 120 (H) 70 - 99 mg/dL  Glucose, capillary     Status: None   Collection Time: 08/28/18 12:00 PM  Result Value Ref Range   Glucose-Capillary 72 70 - 99 mg/dL   No results found.    Assessment and Plan: 57 y.o. male with   .  Alexander Fry was seen today for hospitalization follow-up.  Diagnoses and all orders for this visit:  Peripheral edema -     furosemide (LASIX) 20 MG tablet; Take 1 tablet (20 mg total) by mouth 2 (two) times daily.  Malignant ascites  Controlled type 2 diabetes mellitus without complication, without long-term current use of insulin (HCC)  Hyperbilirubinemia  Anemia of chronic disease  Moderate protein-calorie malnutrition (HCC)  Alkaline phosphatase elevation  Corticosteroid-induced neutrophilia  Duodenitis  Therapeutic opioid induced constipation   Personally reviewed lab results from 08/28/18. Multiple metabolic abnormalities including: Hypocalcemia (8.1), elevated alk phos (529),  hyperbilirubinemia (1.8), protein calorie malnutrition (albumin 2.0, total protein 5.1). Multiple blood dyscrasias including neutrophilia (12.5) with leukocytosis (14.3), normocytic anemia (9.5), mild thrombocytosis (422) Personally reviewed Abdominal US 08/22/18 report showing mild ascites  1. Peripheral edema - severe 4+, interfering with ambulation. Increasing Lasix to 40 mg daily  2. Ascites - unclear if this has worsened from his hospitalization. Consider repeat abdominal US at Oncology appt tomorrow  3. Type 2 DM - well controlled. Cont Glimeperide.  4. Opioid-induced constipation - having multiple daily BM's with Movantik and Colace. I am discontinuing his Dulcolax. Continue Miralax prn.  He will discuss his Decadron taper with Oncology tomorrow. He will need repeat labs 09/04/18. Since he is following with Dr. Ennever for his active cancer treatment he will obtain his labs through Oncology  We reviewed all of his medications today and organized his medications by dosing schedule.   Patient education and anticipatory guidance given Patient agrees with treatment plan Follow-up as needed if symptoms worsen or fail to improve   E.  PA-C  

## 2018-08-31 ENCOUNTER — Inpatient Hospital Stay: Payer: BLUE CROSS/BLUE SHIELD | Attending: Family | Admitting: Hematology & Oncology

## 2018-08-31 ENCOUNTER — Other Ambulatory Visit: Payer: Self-pay

## 2018-08-31 ENCOUNTER — Inpatient Hospital Stay: Payer: BLUE CROSS/BLUE SHIELD

## 2018-08-31 VITALS — BP 123/87 | HR 108 | Temp 98.3°F | Resp 19 | Wt 222.0 lb

## 2018-08-31 DIAGNOSIS — E032 Hypothyroidism due to medicaments and other exogenous substances: Secondary | ICD-10-CM

## 2018-08-31 DIAGNOSIS — R319 Hematuria, unspecified: Secondary | ICD-10-CM | POA: Insufficient documentation

## 2018-08-31 DIAGNOSIS — D500 Iron deficiency anemia secondary to blood loss (chronic): Secondary | ICD-10-CM | POA: Insufficient documentation

## 2018-08-31 DIAGNOSIS — C641 Malignant neoplasm of right kidney, except renal pelvis: Secondary | ICD-10-CM | POA: Insufficient documentation

## 2018-08-31 DIAGNOSIS — C7951 Secondary malignant neoplasm of bone: Secondary | ICD-10-CM | POA: Diagnosis not present

## 2018-08-31 DIAGNOSIS — C787 Secondary malignant neoplasm of liver and intrahepatic bile duct: Secondary | ICD-10-CM | POA: Diagnosis not present

## 2018-08-31 DIAGNOSIS — Z95828 Presence of other vascular implants and grafts: Secondary | ICD-10-CM

## 2018-08-31 DIAGNOSIS — C7800 Secondary malignant neoplasm of unspecified lung: Secondary | ICD-10-CM | POA: Diagnosis not present

## 2018-08-31 LAB — FERRITIN: Ferritin: 5902 ng/mL — ABNORMAL HIGH (ref 24–336)

## 2018-08-31 LAB — CMP (CANCER CENTER ONLY)
ALT: 28 U/L (ref 10–47)
AST: 29 U/L (ref 11–38)
Albumin: 2.4 g/dL — ABNORMAL LOW (ref 3.5–5.0)
Alkaline Phosphatase: 533 U/L — ABNORMAL HIGH (ref 26–84)
Anion gap: 0 — ABNORMAL LOW (ref 5–15)
BUN: 13 mg/dL (ref 7–22)
CHLORIDE: 106 mmol/L (ref 98–108)
CO2: 30 mmol/L (ref 18–33)
Calcium: 9.1 mg/dL (ref 8.0–10.3)
Creatinine: 0.7 mg/dL (ref 0.60–1.20)
GLUCOSE: 104 mg/dL (ref 73–118)
POTASSIUM: 3.2 mmol/L — AB (ref 3.3–4.7)
Sodium: 136 mmol/L (ref 128–145)
Total Bilirubin: 1.5 mg/dL (ref 0.2–1.6)
Total Protein: 6 g/dL — ABNORMAL LOW (ref 6.4–8.1)

## 2018-08-31 LAB — IRON AND TIBC
Iron: 19 ug/dL — ABNORMAL LOW (ref 42–163)
Saturation Ratios: 14 % — ABNORMAL LOW (ref 42–163)
TIBC: 141 ug/dL — ABNORMAL LOW (ref 202–409)
UIBC: 122 ug/dL

## 2018-08-31 LAB — CBC WITH DIFFERENTIAL (CANCER CENTER ONLY)
Basophils Absolute: 0 10*3/uL (ref 0.0–0.1)
Basophils Relative: 0 %
Eosinophils Absolute: 0 10*3/uL (ref 0.0–0.5)
Eosinophils Relative: 0 %
HCT: 34.6 % — ABNORMAL LOW (ref 38.7–49.9)
Hemoglobin: 10.8 g/dL — ABNORMAL LOW (ref 13.0–17.1)
Lymphocytes Relative: 4 %
Lymphs Abs: 0.7 10*3/uL — ABNORMAL LOW (ref 0.9–3.3)
MCH: 26.4 pg — ABNORMAL LOW (ref 28.0–33.4)
MCHC: 31.2 g/dL — ABNORMAL LOW (ref 32.0–35.9)
MCV: 84.6 fL (ref 82.0–98.0)
Monocytes Absolute: 1 10*3/uL — ABNORMAL HIGH (ref 0.1–0.9)
Monocytes Relative: 6 %
Neutro Abs: 15.5 10*3/uL — ABNORMAL HIGH (ref 1.5–6.5)
Neutrophils Relative %: 90 %
Platelet Count: 387 10*3/uL (ref 145–400)
RBC: 4.09 MIL/uL — ABNORMAL LOW (ref 4.20–5.70)
RDW: 21.5 % — ABNORMAL HIGH (ref 11.1–15.7)
WBC Count: 17.2 10*3/uL — ABNORMAL HIGH (ref 4.0–10.0)

## 2018-08-31 LAB — LACTATE DEHYDROGENASE: LDH: 377 U/L — ABNORMAL HIGH (ref 98–192)

## 2018-08-31 LAB — TSH: TSH: 5.793 u[IU]/mL — ABNORMAL HIGH (ref 0.320–4.118)

## 2018-08-31 MED ORDER — SODIUM CHLORIDE 0.9% FLUSH
10.0000 mL | INTRAVENOUS | Status: AC | PRN
  Administered 2018-08-31: 10 mL via INTRAVENOUS
  Filled 2018-08-31: qty 10

## 2018-08-31 MED ORDER — DEXAMETHASONE 4 MG PO TABS: 4.0000 mg | ORAL_TABLET | Freq: Two times a day (BID) | ORAL | 3 refills | Status: DC

## 2018-08-31 MED ORDER — HEPARIN SOD (PORK) LOCK FLUSH 100 UNIT/ML IV SOLN
500.0000 [IU] | Freq: Once | INTRAVENOUS | Status: AC
  Administered 2018-08-31: 500 [IU] via INTRAVENOUS
  Filled 2018-08-31: qty 5

## 2018-08-31 MED ORDER — METOCLOPRAMIDE HCL 10 MG PO TABS: 20.0000 mg | ORAL_TABLET | Freq: Three times a day (TID) | ORAL | 2 refills | Status: DC

## 2018-08-31 NOTE — Addendum Note (Signed)
Addended by: San Morelle on: 08/31/2018 10:31 AM   Modules accepted: Orders, SmartSet

## 2018-08-31 NOTE — Progress Notes (Signed)
Hematology and Oncology Follow Up Visit  Alexander Fry [id number] 02-29-1961 57 y.o. 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  He was just discharged from the hospital last week.  He had been in the hospital couple times because of abdominal issues.  He has duodenitis.  He was having a hard time going to the bathroom.  He seemed to have what almost look like a small bowel obstruction.  He had multiple abdominal x-rays.  He had some dilated loops of bowel.  He has been a real struggle to try to get his intestines to work.  He is still somewhat distended.  He had a lot of discomfort yesterday.  He is feeling better today.  He is passing a lot of flatus.  I just worry that he has a malignant pseudoobstruction.  There is really no way to prove this unless he has an exploratory laparotomy.  He is on quite a few medications.  What helped him most with his abdominal pain was Decadron.  He was given a Decadron taper.  This I do not think it is needed.  I would just keep him on a standard dose of Decadron at 4 mg p.o. twice daily.  He is also on Reglan.  I will try to increase the Reglan dose to 20 mg p.o. 4 times daily.  I really worry that we are losing ground with his malignancy.  We really have not treated  him for a while.  I just do not think that we can do anything different right now until this abdominal issue gets sorted out.  He is having no problems with fever.  He is not bleeding.  He has had no cough or shortness of breath.  He has had a lot of leg swelling.  He has been put on Lasix.  He does not want any compression stockings for right now.  He really is not eating much at all.  He says whenever he eats, he gets bloated.  Overall, I said that his performance status is ECOG 2.    Medications:  Allergies as of 08/31/2018      Reactions   Feraheme [ferumoxytol] Anaphylaxis, Other (See Comments)   Flush. Patient denies anaphylaxis.      Medication List        Accurate as of 08/31/18  9:42 AM. Always use your most recent med list.          dexamethasone 0.5 MG tablet Commonly known as:  DECADRON Take 4 tablets (2 mg total) by mouth 2 (two) times daily for 1 day, THEN 4 tablets (2 mg total) daily for 3 days, THEN 2 tablets (1 mg total) daily for 3 days, THEN 1 tablet (0.5 mg total) daily for 3 days. Start taking on:  08/28/2018  docusate sodium 100 MG capsule Commonly known as:  COLACE Take 1 capsule (100 mg total) by mouth 2 (two) times daily.   fentaNYL 100 MCG/HR Commonly known as:  DURAGESIC - dosed mcg/hr Place 1 patch (100 mcg total) onto the skin every 3 (three) days for 15 days.   furosemide 20 MG tablet Commonly known as:  LASIX Take 1 tablet (20 mg total) by mouth 2 (two) times daily.   glimepiride 4 MG tablet Commonly known as:  AMARYL Take 1 tablet (4 mg total) by mouth daily with breakfast.   HYDROmorphone 4 MG tablet Commonly known as:  DILAUDID Take 1 tablet (4 mg total) by mouth every 4 (four) hours as needed for moderate pain or severe pain.   metoCLOPramide 10 MG tablet Commonly known as:  REGLAN Take 1 tablet (10 mg total) by mouth 3 (three) times daily before meals.   naloxegol oxalate 25 MG Tabs tablet Commonly known as:  MOVANTIK Take 1  tablet (25 mg total) by mouth daily.   pantoprazole 40 MG tablet Commonly known as:  PROTONIX Take 1 tablet (40 mg total) by mouth daily.   polyethylene glycol packet Commonly known as:  MIRALAX / GLYCOLAX Take 17 g by mouth daily.   rivaroxaban 10 MG Tabs tablet Commonly known as:  XARELTO Take 1 tablet (10 mg total) by mouth daily with supper.   simethicone 80 MG chewable tablet Commonly known as:  MYLICON Chew 1 tablet (80 mg total) by mouth 4 (four) times daily as needed for flatulence.       Allergies:  Allergies  Allergen Reactions  . Feraheme [Ferumoxytol] Anaphylaxis and Other (See Comments)    Flush. Patient denies anaphylaxis.    Past Medical History, Surgical history, Social history, and Family History were reviewed and updated.  Review of Systems: Review of Systems  Constitutional: Negative.   HENT: Negative.   Eyes: Negative.   Respiratory: Negative.   Cardiovascular: Negative.   Gastrointestinal: Negative.   Genitourinary: Negative.   Musculoskeletal: Positive for joint pain.  Skin: Negative.   Neurological: Negative.   Endo/Heme/Allergies: Negative.   Psychiatric/Behavioral: Negative.      Physical Exam:  weight is 222 lb (100.7 kg). His oral temperature is 98.3 F (36.8 C). His blood pressure is 123/87 and his pulse is 108 (abnormal). His respiration is 19 and oxygen saturation is 100%.   Wt Readings from Last 3 Encounters:  08/31/18 222 lb (100.7 kg)  08/30/18 224 lb 14.4 oz (102 kg)  08/28/18 228 lb 4.8 oz (103.6 kg)    Physical Exam  Constitutional: He is oriented to person, place, and time.  HENT:  Head: Normocephalic and atraumatic.  Mouth/Throat: Oropharynx is clear and moist.  Eyes: Pupils are equal, round, and reactive to light. EOM are normal.  Neck: Normal range of motion.  Cardiovascular: Normal rate, regular rhythm and normal heart sounds.  Pulmonary/Chest: Effort normal and breath sounds normal.  Abdominal: Soft. Bowel  sounds are normal.  Abdomen is distended.  I can barely hear any bowel sounds.  There is no guarding or rebound tenderness.  The abdomen is hypertympanic to percussion.  There is no palpable liver or spleen tip.  Musculoskeletal: Normal range of motion. He exhibits no edema, tenderness or deformity.  His extremities shows 2+ edema in his lower legs.  Lymphadenopathy:    He has no cervical adenopathy.  Neurological: He is alert and oriented to person, place, and time.  Skin: Skin is warm and dry. No  rash noted. No erythema.  Psychiatric: He has a normal mood and affect. His behavior is normal. Judgment and thought content normal.  Vitals reviewed.    Lab Results  Component Value Date   WBC 17.2 (H) 08/31/2018   HGB 10.8 (L) 08/31/2018   HCT 34.6 (L) 08/31/2018   MCV 84.6 08/31/2018   PLT 387 08/31/2018   Lab Results  Component Value Date   FERRITIN 3,017 (H) 08/24/2018   IRON 31 (L) 08/24/2018   TIBC 177 (L) 08/24/2018   UIBC 146 08/24/2018   IRONPCTSAT 17 (L) 08/24/2018   Lab Results  Component Value Date   RETICCTPCT 0.4 (L) 07/20/2018   RBC 4.09 (L) 08/31/2018   No results found for: KPAFRELGTCHN, LAMBDASER, KAPLAMBRATIO No results found for: IGGSERUM, IGA, IGMSERUM No results found for: Kathrynn Ducking, MSPIKE, SPEI   Chemistry      Component Value Date/Time   NA 138 08/28/2018 0328   NA 147 (H) 11/11/2017 1337   NA 141 10/13/2017 1118   K 3.7 08/28/2018 0328   K 3.9 11/11/2017 1337   K 3.6 10/13/2017 1118   CL 100 08/28/2018 0328   CL 107 11/11/2017 1337   CO2 30 08/28/2018 0328   CO2 28 11/11/2017 1337   CO2 25 10/13/2017 1118   BUN 14 08/28/2018 0328   BUN 12 11/11/2017 1337   BUN 9.1 10/13/2017 1118   CREATININE 0.70 08/28/2018 0328   CREATININE 0.60 07/31/2018 0916   CREATININE 0.9 11/11/2017 1337   CREATININE 0.7 10/13/2017 1118      Component Value Date/Time   CALCIUM 8.1 (L) 08/28/2018 0328   CALCIUM  9.2 11/11/2017 1337   CALCIUM 9.2 10/13/2017 1118   ALKPHOS 529 (H) 08/28/2018 0328   ALKPHOS 166 (H) 11/11/2017 1337   ALKPHOS 181 (H) 10/13/2017 1118   AST 29 08/28/2018 0328   AST 185 (HH) 07/31/2018 0916   AST 38 (H) 10/13/2017 1118   ALT 34 08/28/2018 0328   ALT 151 (H) 07/31/2018 0916   ALT 38 11/11/2017 1337   ALT 34 10/13/2017 1118   BILITOT 1.8 (H) 08/28/2018 0328   BILITOT 4.1 (HH) 07/31/2018 0916   BILITOT 1.02 10/13/2017 1118      Impression and Plan: Mr. Sitar is a very pleasant 57 yo caucasian gentleman with metastatic renal cell carcinoma.   We are really running into problems with respect to his abdomen.  I just wish there was some way that we could get this sorted out and fix so he will be able to eat more and have more regular bowel movements.  I somehow feel that the only way that we will be able to know what is going on is for him to undergo an exploratory laparotomy.  I do still no or think that surgery would be in favor of this given that he has metastatic renal cell cancer.    I would like to treat him with pembrolizumab and Inlyta.  I think this would be a reasonable treatment for him.  I think this would be tolerated.  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  I spent about 45 minutes with him.  All the time spent face-to-face.  This is incredibly complicated.  I reviewed his medications.  I updated his medicines.    I will plan to see him back in about 2 weeks.  I will get an a abdominal x-ray on him when we see  him back.     [name], MD 9/26/20199:42 AM

## 2018-09-01 ENCOUNTER — Telehealth: Payer: Self-pay | Admitting: *Deleted

## 2018-09-01 NOTE — Telephone Encounter (Addendum)
Patient aware of results. Appointment made  ----- Message from [name], MD sent at 08/31/2018  1:48 PM EDT ----- Call - the iron is low.  He will need 1 dose of Injectafer!!  [employer]

## 2018-09-05 ENCOUNTER — Other Ambulatory Visit: Payer: Self-pay | Admitting: Radiation Therapy

## 2018-09-05 ENCOUNTER — Inpatient Hospital Stay: Payer: BLUE CROSS/BLUE SHIELD | Attending: Family

## 2018-09-05 VITALS — BP 131/72 | HR 86 | Temp 98.5°F | Resp 17

## 2018-09-05 DIAGNOSIS — D500 Iron deficiency anemia secondary to blood loss (chronic): Secondary | ICD-10-CM | POA: Diagnosis present

## 2018-09-05 DIAGNOSIS — C7800 Secondary malignant neoplasm of unspecified lung: Secondary | ICD-10-CM | POA: Insufficient documentation

## 2018-09-05 DIAGNOSIS — C7931 Secondary malignant neoplasm of brain: Secondary | ICD-10-CM | POA: Insufficient documentation

## 2018-09-05 DIAGNOSIS — C786 Secondary malignant neoplasm of retroperitoneum and peritoneum: Secondary | ICD-10-CM | POA: Diagnosis not present

## 2018-09-05 DIAGNOSIS — C649 Malignant neoplasm of unspecified kidney, except renal pelvis: Secondary | ICD-10-CM | POA: Diagnosis not present

## 2018-09-05 DIAGNOSIS — C7951 Secondary malignant neoplasm of bone: Secondary | ICD-10-CM | POA: Insufficient documentation

## 2018-09-05 DIAGNOSIS — C7949 Secondary malignant neoplasm of other parts of nervous system: Principal | ICD-10-CM

## 2018-09-05 DIAGNOSIS — R319 Hematuria, unspecified: Secondary | ICD-10-CM | POA: Diagnosis present

## 2018-09-05 DIAGNOSIS — C641 Malignant neoplasm of right kidney, except renal pelvis: Secondary | ICD-10-CM

## 2018-09-05 MED ORDER — HEPARIN SOD (PORK) LOCK FLUSH 100 UNIT/ML IV SOLN
500.0000 [IU] | Freq: Once | INTRAVENOUS | Status: AC
  Administered 2018-09-05: 500 [IU] via INTRAVENOUS
  Filled 2018-09-05: qty 5

## 2018-09-05 MED ORDER — SODIUM CHLORIDE 0.9 % IV SOLN
750.0000 mg | Freq: Once | INTRAVENOUS | Status: AC
  Administered 2018-09-05: 750 mg via INTRAVENOUS
  Filled 2018-09-05: qty 15

## 2018-09-05 MED ORDER — SODIUM CHLORIDE 0.9% FLUSH
10.0000 mL | INTRAVENOUS | Status: DC | PRN
  Administered 2018-09-05: 10 mL via INTRAVENOUS
  Filled 2018-09-05: qty 10

## 2018-09-05 MED ORDER — SODIUM CHLORIDE 0.9 % IV SOLN
INTRAVENOUS | Status: DC
  Administered 2018-09-05: 12:00:00 via INTRAVENOUS
  Filled 2018-09-05: qty 250

## 2018-09-05 NOTE — Patient Instructions (Signed)

## 2018-09-07 ENCOUNTER — Other Ambulatory Visit: Payer: Self-pay | Admitting: Physician Assistant

## 2018-09-07 ENCOUNTER — Encounter: Payer: Self-pay | Admitting: Physician Assistant

## 2018-09-07 DIAGNOSIS — R609 Edema, unspecified: Secondary | ICD-10-CM

## 2018-09-07 MED ORDER — FUROSEMIDE 20 MG PO TABS: 40.0000 mg | ORAL_TABLET | Freq: Two times a day (BID) | ORAL | 0 refills | Status: DC | PRN

## 2018-09-08 ENCOUNTER — Telehealth: Payer: Self-pay | Admitting: *Deleted

## 2018-09-08 NOTE — Telephone Encounter (Signed)
Patient is moving to [place], [place]. He requests that we make a referral to [facility].   Referral sent to referral fax [phone]

## 2018-09-12 ENCOUNTER — Other Ambulatory Visit: Payer: Self-pay | Admitting: Radiation Therapy

## 2018-09-14 ENCOUNTER — Telehealth: Payer: Self-pay | Admitting: *Deleted

## 2018-09-14 NOTE — Progress Notes (Signed)
error 

## 2018-09-14 NOTE — Telephone Encounter (Signed)
Patient c/o continued BLE edema which isn't getting better while taking his lasix. He states he is also drinking plenty and walking as often as he can.   Reviewing his labs, his albumin and total protein are low.   Explained to the patient that he was likely experiencing third spacing and that lasix wouldn't help with removing this fluid. Instructed patient to increase dietary protein including supplements and powders. The increased protein should help full fluid back into the vascular space to allow for diuresis. He will be followed up on at his next appointment, already scheduled for next week.  He understands the directions and will add a protein powder to his diet.

## 2018-09-16 ENCOUNTER — Ambulatory Visit
Admission: RE | Admit: 2018-09-16 | Discharge: 2018-09-16 | Disposition: A | Discharge status: Home / Self Care | Payer: BLUE CROSS/BLUE SHIELD | Source: Ambulatory Visit | Attending: Radiation Oncology | Admitting: Radiation Oncology

## 2018-09-16 DIAGNOSIS — C7931 Secondary malignant neoplasm of brain: Secondary | ICD-10-CM

## 2018-09-16 DIAGNOSIS — C7949 Secondary malignant neoplasm of other parts of nervous system: Principal | ICD-10-CM

## 2018-09-16 MED ORDER — GADOBENATE DIMEGLUMINE 529 MG/ML IV SOLN
20.0000 mL | Freq: Once | INTRAVENOUS | Status: AC | PRN
  Administered 2018-09-16: 20 mL via INTRAVENOUS

## 2018-09-18 ENCOUNTER — Encounter (HOSPITAL_COMMUNITY)
Admission: RE | Admit: 2018-09-18 | Discharge: 2018-09-18 | Disposition: A | Discharge status: Home / Self Care | Payer: BLUE CROSS/BLUE SHIELD | Source: Ambulatory Visit | Attending: Hematology & Oncology | Admitting: Hematology & Oncology

## 2018-09-18 ENCOUNTER — Other Ambulatory Visit: Payer: BLUE CROSS/BLUE SHIELD

## 2018-09-18 DIAGNOSIS — C641 Malignant neoplasm of right kidney, except renal pelvis: Secondary | ICD-10-CM | POA: Diagnosis not present

## 2018-09-18 LAB — GLUCOSE, CAPILLARY: GLUCOSE-CAPILLARY: 70 mg/dL (ref 70–99)

## 2018-09-18 MED ORDER — FLUDEOXYGLUCOSE F - 18 (FDG) INJECTION
11.2800 | Freq: Once | INTRAVENOUS | Status: AC | PRN
  Administered 2018-09-18: 11.28 via INTRAVENOUS

## 2018-09-19 ENCOUNTER — Inpatient Hospital Stay (HOSPITAL_BASED_OUTPATIENT_CLINIC_OR_DEPARTMENT_OTHER): Payer: BLUE CROSS/BLUE SHIELD | Admitting: Hematology & Oncology

## 2018-09-19 ENCOUNTER — Ambulatory Visit: Payer: BLUE CROSS/BLUE SHIELD | Admitting: Radiation Oncology

## 2018-09-19 ENCOUNTER — Inpatient Hospital Stay: Payer: BLUE CROSS/BLUE SHIELD

## 2018-09-19 ENCOUNTER — Telehealth: Payer: Self-pay | Admitting: Radiation Oncology

## 2018-09-19 ENCOUNTER — Other Ambulatory Visit: Payer: Self-pay

## 2018-09-19 ENCOUNTER — Encounter: Payer: Self-pay | Admitting: Hematology & Oncology

## 2018-09-19 DIAGNOSIS — C649 Malignant neoplasm of unspecified kidney, except renal pelvis: Secondary | ICD-10-CM

## 2018-09-19 DIAGNOSIS — C786 Secondary malignant neoplasm of retroperitoneum and peritoneum: Secondary | ICD-10-CM

## 2018-09-19 DIAGNOSIS — Z95828 Presence of other vascular implants and grafts: Secondary | ICD-10-CM

## 2018-09-19 DIAGNOSIS — C641 Malignant neoplasm of right kidney, except renal pelvis: Secondary | ICD-10-CM

## 2018-09-19 DIAGNOSIS — D500 Iron deficiency anemia secondary to blood loss (chronic): Secondary | ICD-10-CM

## 2018-09-19 DIAGNOSIS — C7800 Secondary malignant neoplasm of unspecified lung: Secondary | ICD-10-CM

## 2018-09-19 DIAGNOSIS — C7951 Secondary malignant neoplasm of bone: Secondary | ICD-10-CM

## 2018-09-19 DIAGNOSIS — C7931 Secondary malignant neoplasm of brain: Secondary | ICD-10-CM | POA: Diagnosis not present

## 2018-09-19 DIAGNOSIS — R319 Hematuria, unspecified: Secondary | ICD-10-CM

## 2018-09-19 DIAGNOSIS — I82890 Acute embolism and thrombosis of other specified veins: Secondary | ICD-10-CM

## 2018-09-19 LAB — CMP (CANCER CENTER ONLY)
ALBUMIN: 2.5 g/dL — AB (ref 3.5–5.0)
ALT: 29 U/L (ref 10–47)
AST: 31 U/L (ref 11–38)
Alkaline Phosphatase: 448 U/L — ABNORMAL HIGH (ref 26–84)
Anion gap: 2 — ABNORMAL LOW (ref 5–15)
BUN: 14 mg/dL (ref 7–22)
CHLORIDE: 104 mmol/L (ref 98–108)
CO2: 32 mmol/L (ref 18–33)
CREATININE: 1.2 mg/dL (ref 0.60–1.20)
Calcium: 8.9 mg/dL (ref 8.0–10.3)
Glucose, Bld: 106 mg/dL (ref 73–118)
POTASSIUM: 3.9 mmol/L (ref 3.3–4.7)
SODIUM: 138 mmol/L (ref 128–145)
Total Bilirubin: 0.9 mg/dL (ref 0.2–1.6)
Total Protein: 6 g/dL — ABNORMAL LOW (ref 6.4–8.1)

## 2018-09-19 LAB — CBC WITH DIFFERENTIAL (CANCER CENTER ONLY)
Abs Immature Granulocytes: 0.96 10*3/uL — ABNORMAL HIGH (ref 0.00–0.07)
BASOS ABS: 0 10*3/uL (ref 0.0–0.1)
BASOS PCT: 0 %
EOS ABS: 0 10*3/uL (ref 0.0–0.5)
EOS PCT: 0 %
HCT: 40.3 % (ref 39.0–52.0)
Hemoglobin: 12.1 g/dL — ABNORMAL LOW (ref 13.0–17.0)
Immature Granulocytes: 6 %
LYMPHS PCT: 5 %
Lymphs Abs: 0.8 10*3/uL (ref 0.7–4.0)
MCH: 25.9 pg — AB (ref 26.0–34.0)
MCHC: 30 g/dL (ref 30.0–36.0)
MCV: 86.1 fL (ref 80.0–100.0)
MONO ABS: 0.6 10*3/uL (ref 0.1–1.0)
Monocytes Relative: 3 %
NRBC: 0 % (ref 0.0–0.2)
Neutro Abs: 14.6 10*3/uL — ABNORMAL HIGH (ref 1.7–7.7)
Neutrophils Relative %: 86 %
PLATELETS: 296 10*3/uL (ref 150–400)
RBC: 4.68 MIL/uL (ref 4.22–5.81)
RDW: 20.5 % — AB (ref 11.5–15.5)
WBC: 17 10*3/uL — AB (ref 4.0–10.5)

## 2018-09-19 MED ORDER — SODIUM CHLORIDE 0.9% FLUSH
10.0000 mL | INTRAVENOUS | Status: DC | PRN
  Administered 2018-09-19: 10 mL via INTRAVENOUS
  Filled 2018-09-19: qty 10

## 2018-09-19 MED ORDER — OXYCODONE HCL 20 MG/ML PO CONC: 20.0000 mg | ORAL | 0 refills | Status: AC | PRN

## 2018-09-19 MED ORDER — HEPARIN SOD (PORK) LOCK FLUSH 100 UNIT/ML IV SOLN
500.0000 [IU] | Freq: Once | INTRAVENOUS | Status: AC
  Administered 2018-09-19: 500 [IU] via INTRAVENOUS
  Filled 2018-09-19: qty 5

## 2018-09-19 MED ORDER — FENTANYL 100 MCG/HR TD PT72: 100.0000 ug | MEDICATED_PATCH | TRANSDERMAL | 0 refills | Status: AC

## 2018-09-19 MED ORDER — RIVAROXABAN 10 MG PO TABS: 10.0000 mg | ORAL_TABLET | Freq: Every day | ORAL | 3 refills | Status: AC

## 2018-09-19 MED FILL — OXYCODONE HCL 100 MG/5 ML S: 100 | 10 days supply | Qty: 60 | Fill #0

## 2018-09-19 MED FILL — XARELTO 10 MG TABLET: 10 | 30 days supply | Qty: 30 | Fill #0

## 2018-09-19 MED FILL — fentaNYL 100 MCG/HR PT72: 100 | 30 days supply | Qty: 15 | Fill #0

## 2018-09-19 NOTE — Progress Notes (Signed)
Hematology and Oncology Follow Up Visit  Alexander Fry [id number] [birth year] 57 y.o. 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  Unfortunately, it is apparent that his cancer is becoming rapidly progressive.  He comes in a wheelchair.  2 relatives come down from [place] to take him home.  He is not eating.  He just cannot eat.  The PET scan really shows [name] why.  He had a PET scan done on October 14.  The PET scan shows substantial progression of disease.  This is widespread.  He has peritoneal carcinomatosis.  He has large volume ascites.  He has lung metastasis.  He has adrenal metastasis.  He has anasarca.  He is ready for hospice.  He is already told our office that he wants to go home and be with his family up in [place].  He actually is going to go to the [facility] for a consultation.  I think this is a great idea.  I really believe that they will concur with our recommendation that we focus on comfort care.  Again he is not eating.  He is having more pain.  I will increase his fentanyl patch up to 100 mcg every 2 days.  Because he really is not able to absorb pills, I will put him on OxyFast elixir.  We went through his medications.  I stopped most of his  medications that he just does not need at this point.  Thankfully he has not gotten sick.  He still having some bowel movements.  He is having no bleeding.  He has had no fever.  He has been how long I thought he had.  I told him that if he made it to Thanksgiving I thought that would be optimistic.  In reality, I am not sure he is going to make it through the month of October.  I really hate this for Alexander Fry.  He has tried his best.  He is done everything we have asked him to do and then some.  He really has gone above and beyond "the call of duty" to try to get better.  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  I would have to say that at this time, his performance status is ECOG 3-4.     Medications:  Allergies as of 09/19/2018      Reactions   Feraheme [ferumoxytol] Anaphylaxis, Other (See Comments)   Flush. Patient denies anaphylaxis.      Medication List        Accurate as of 09/19/18  3:18 PM. Always use your most recent med list.          dexamethasone  4 MG tablet Commonly known as:  DECADRON Take 1 tablet (4 mg total) by mouth 2 (two) times daily.   furosemide 20 MG tablet Commonly known as:  LASIX Take 2 tablets (40 mg total) by mouth 2 (two) times daily as needed for edema.   glimepiride 4 MG tablet Commonly known as:  AMARYL Take 1 tablet (4 mg total) by mouth daily with breakfast.   HYDROmorphone 4 MG tablet Commonly known as:  DILAUDID Take 1 tablet (4 mg total) by mouth every 4 (four) hours as needed for moderate pain or severe pain.   metoCLOPramide 10 MG tablet Commonly known as:  REGLAN Take 2 tablets (20 mg total) by mouth 4 (four) times daily -  before meals and at bedtime.   naloxegol oxalate 25 MG Tabs tablet Commonly known as:  MOVANTIK Take 1 tablet (25 mg total) by mouth daily.   pantoprazole 40 MG tablet Commonly  known as:  PROTONIX Take 1 tablet (40 mg total) by mouth daily.   rivaroxaban 10 MG Tabs tablet Commonly known as:  XARELTO Take 1 tablet (10 mg total) by mouth daily with supper.   simethicone 80 MG chewable tablet Commonly known as:  MYLICON Chew 1 tablet (80 mg total) by mouth 4 (four) times daily as needed for flatulence.       Allergies:  Allergies  Allergen Reactions  . Feraheme [Ferumoxytol] Anaphylaxis and Other (See Comments)    Flush. Patient denies anaphylaxis.    Past Medical History, Surgical history, Social history, and Family History were reviewed and updated.  Review of Systems: Review of Systems  Constitutional: Negative.   HENT: Negative.   Eyes: Negative.   Respiratory: Negative.   Cardiovascular: Negative.   Gastrointestinal: Negative.   Genitourinary: Negative.   Musculoskeletal: Positive for joint pain.  Skin: Negative.   Neurological: Negative.   Endo/Heme/Allergies: Negative.   Psychiatric/Behavioral: Negative.      Physical Exam:  weight is 229 lb (103.9 kg). His oral temperature is 98.6 F (37 C). His blood pressure is 130/78 and his pulse is 109 (abnormal). His respiration is 18 and oxygen saturation is 95%.   Wt Readings from Last 3 Encounters:  09/19/18 229 lb (103.9 kg)  08/31/18 222 lb (100.7 kg)  08/30/18 224 lb 14.4 oz (102 kg)    Physical Exam  Constitutional: He is oriented to person, place, and time.  HENT:  Head: Normocephalic and atraumatic.  Mouth/Throat: Oropharynx is clear and moist.  Eyes: Pupils are equal, round, and reactive to light. EOM are normal.  Neck: Normal range of motion.  Cardiovascular: Normal rate, regular rhythm and normal heart sounds.  Pulmonary/Chest: Effort normal and breath sounds normal.  Abdominal: Soft. Bowel sounds are normal.  Abdomen is distended.  I can barely hear any bowel sounds.  There is no guarding or rebound tenderness.  The abdomen is hypertympanic to percussion.  There is no  palpable liver or spleen tip.  Musculoskeletal: Normal range of motion. He exhibits no edema, tenderness or deformity.  His extremities shows 2+ edema in his lower legs.  Lymphadenopathy:    He has no cervical adenopathy.  Neurological: He is alert and oriented to person, place, and time.  Skin: Skin is warm and dry. No rash noted. No erythema.  Psychiatric: He has a normal mood and affect. His behavior is normal. Judgment and thought content normal.  Vitals reviewed.    Lab Results  Component Value Date   WBC 17.0 (H) 09/19/2018  HGB 12.1 (L) 09/19/2018   HCT 40.3 09/19/2018   MCV 86.1 09/19/2018   PLT 296 09/19/2018   Lab Results  Component Value Date   FERRITIN 5,902 (H) 08/31/2018   IRON 19 (L) 08/31/2018   TIBC 141 (L) 08/31/2018   UIBC 122 08/31/2018   IRONPCTSAT 14 (L) 08/31/2018   Lab Results  Component Value Date   RETICCTPCT 0.4 (L) 07/20/2018   RBC 4.68 09/19/2018   No results found for: KPAFRELGTCHN, LAMBDASER, KAPLAMBRATIO No results found for: IGGSERUM, IGA, IGMSERUM No results found for: Kathrynn Ducking, MSPIKE, SPEI   Chemistry      Component Value Date/Time   NA 138 09/19/2018 1415   NA 147 (H) 11/11/2017 1337   NA 141 10/13/2017 1118   K 3.9 09/19/2018 1415   K 3.9 11/11/2017 1337   K 3.6 10/13/2017 1118   CL 104 09/19/2018 1415   CL 107 11/11/2017 1337   CO2 32 09/19/2018 1415   CO2 28 11/11/2017 1337   CO2 25 10/13/2017 1118   BUN 14 09/19/2018 1415   BUN 12 11/11/2017 1337   BUN 9.1 10/13/2017 1118   CREATININE 1.20 09/19/2018 1415   CREATININE 0.9 11/11/2017 1337   CREATININE 0.7 10/13/2017 1118      Component Value Date/Time   CALCIUM 8.9 09/19/2018 1415   CALCIUM 9.2 11/11/2017 1337   CALCIUM 9.2 10/13/2017 1118   ALKPHOS 448 (H) 09/19/2018 1415   ALKPHOS 166 (H) 11/11/2017 1337   ALKPHOS 181 (H) 10/13/2017 1118   AST 31 09/19/2018 1415   AST 38 (H) 10/13/2017 1118   ALT 29 09/19/2018  1415   ALT 38 11/11/2017 1337   ALT 34 10/13/2017 1118   BILITOT 0.9 09/19/2018 1415   BILITOT 1.02 10/13/2017 1118      Impression and Plan: Alexander Fry is a very pleasant 57 yo caucasian gentleman with metastatic renal cell carcinoma.   Again, we spent most of the time just saying our goodbyes.  He really liked our nurses.  They all came in to say goodbye to him.  This was very difficult for our staff.  Alexander Fry is a real decent human being.  This world is a better place because of him.  I am glad that his family is so concerned and so considerate to have driven down here to take him home.   [name], MD 10/15/20193:18 PM

## 2018-09-19 NOTE — Telephone Encounter (Signed)
Dr. Antonieta Pert office called patient had an appointment with Dr. Marin Olp today 09/19/2018 they stated Mr. Alexander Fry is moving back to [place] will not be at his appointment with Dr. Isidore Moos on 09/20/2018. They did not want to reschedule

## 2018-09-20 ENCOUNTER — Inpatient Hospital Stay
Admission: RE | Admit: 2018-09-20 | Discharge: 2018-09-20 | Disposition: A | Discharge status: Home / Self Care | Payer: BLUE CROSS/BLUE SHIELD | Source: Ambulatory Visit | Attending: Radiation Oncology | Admitting: Radiation Oncology

## 2018-09-20 LAB — IRON AND TIBC
Iron: 30 ug/dL — ABNORMAL LOW (ref 42–163)
Saturation Ratios: 17 % — ABNORMAL LOW (ref 42–163)
TIBC: 169 ug/dL — AB (ref 202–409)
UIBC: 139 ug/dL

## 2018-09-20 LAB — LACTATE DEHYDROGENASE: LDH: 607 U/L — AB (ref 98–192)

## 2018-09-20 LAB — FERRITIN: Ferritin: 13190 ng/mL — ABNORMAL HIGH (ref 24–336)

## 2018-09-21 NOTE — Progress Notes (Signed)
Brain and Spine Tumor Board Documentation  Gerlad Pelzel was presented by Cecil Cobbs, MD at Brain and Spine Tumor Board on 09/21/2018, which included representatives from neuro oncology, radiation oncology, surgical oncology, radiology, pathology, navigation.  Josiah was presented as a current patient with history of the following treatments:  .  Additionally, we reviewed previous medical and familial history, history of present illness, and recent lab results along with all available histopathologic and imaging studies. The tumor board considered available treatment options and made the following recommendations:  Active surveillance    Tumor board is a meeting of clinicians from various specialty areas who evaluate and discuss patients for whom a multidisciplinary approach is being considered. Final determinations in the plan of care are those of the provider(s). The responsibility for follow up of recommendations given during tumor board is that of the provider.   Today's extended care, comprehensive team conference, Bertram was not present for the discussion and was not examined.

## 2018-09-22 ENCOUNTER — Other Ambulatory Visit: Payer: BLUE CROSS/BLUE SHIELD

## 2018-09-26 ENCOUNTER — Ambulatory Visit: Payer: Self-pay | Admitting: Radiation Oncology

## 2018-10-06 DEATH — deceased

## 2018-11-30 ENCOUNTER — Encounter: Payer: Self-pay | Admitting: Hematology & Oncology

## 2019-01-30 IMAGING — MR MR HEAD WO/W CM
11 series · 48 of 48 positions shown · IV contrast (multihance)
Comparison: MRI head 06/23/2018, 02/03/2018

CLINICAL DATA: Metastatic renal cell carcinoma. Post SRS. 4 targets
11/18/2017, 15 targets treated 06/07/2017. Four targets treated
02/21/2017

EXAM:
MRI HEAD WITHOUT AND WITH CONTRAST
TECHNIQUE: Multiplanar, multiecho pulse sequences of the brain and surrounding
structures were obtained without and with intravenous contrast.
CONTRAST:  20mL MULTIHANCE GADOBENATE DIMEGLUMINE 529 MG/ML IV SOLN

[Series 2: FLAIR · sagittal · 3.0mm · 0.75mm/px · 2 of 39 slices shown (1 of 2)]
[im 1/39]
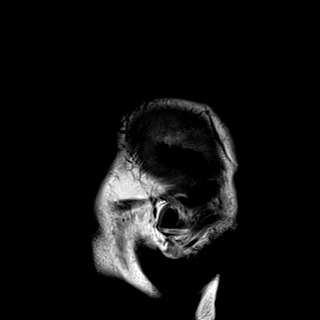
[im 39/39]
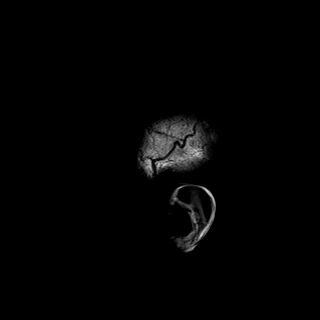

[Series 3: DWI · axial · 3.0mm · 1.50mm/px · z∈[-76,+72]mm · 5 of 78 slices shown (1 of 2)]
[im 1/78]
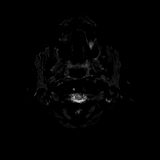
[im 20/78]
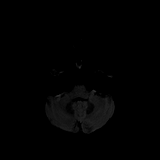
[im 39/78]
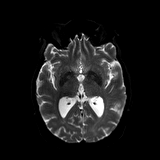
[im 58/78]
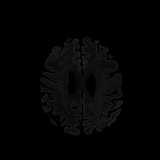
[im 78/78]
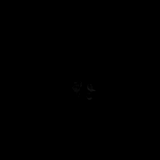

[Series 4: DWI · axial · 3.0mm · 1.50mm/px · z∈[-76,+72]mm · 3 of 39 slices shown (2 of 2)]
[im 1/39]
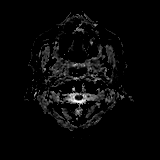
[im 20/39]
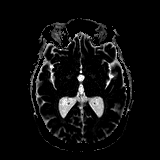
[im 39/39]
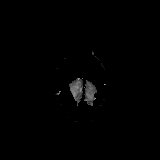

[Series 5: T2 · axial · 5.0mm · 0.60mm/px · z∈[-82,+74]mm · 2 of 27 slices shown]
[im 1/27]
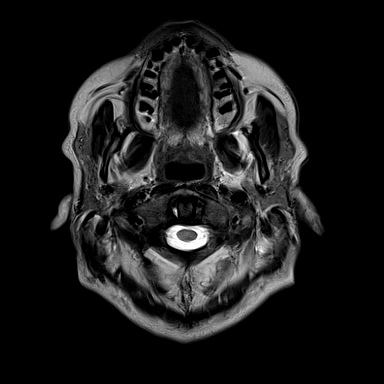
[im 27/27]
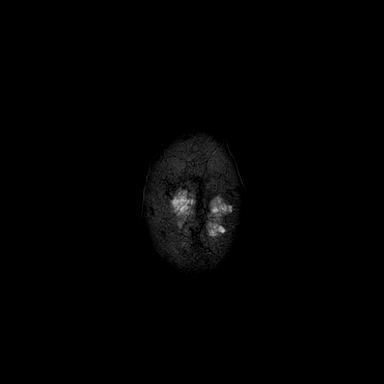

[Series 6: GRE · axial · 5.0mm · 0.57mm/px · z∈[-82,+74]mm · 2 of 27 slices shown]
[im 1/27]
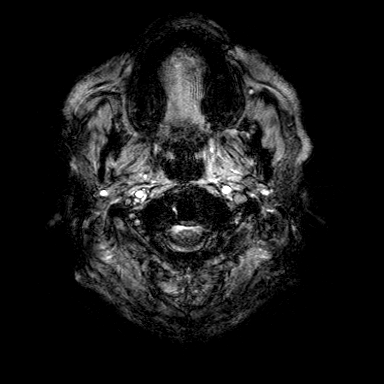
[im 27/27]
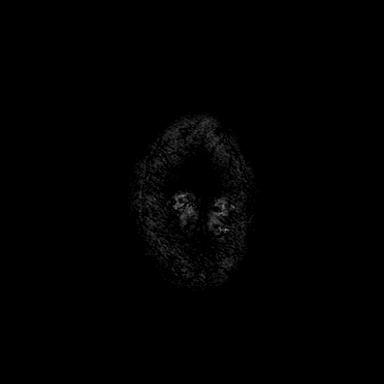

[Series 7: FLAIR · axial · 3.0mm · 0.60mm/px · z∈[-80,+73]mm · 3 of 52 slices shown (2 of 2)]
[im 1/52]
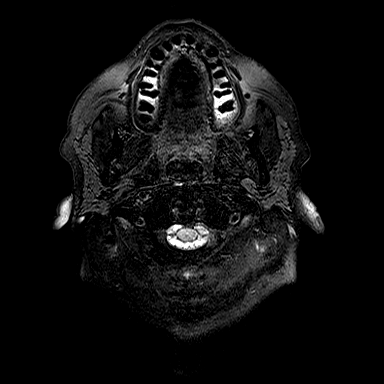
[im 26/52]
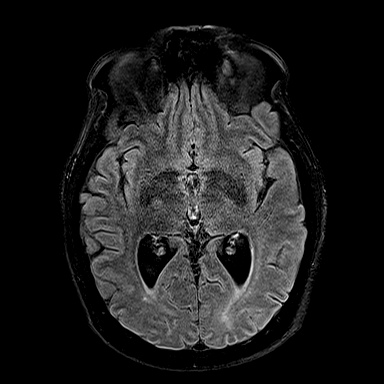
[im 52/52]
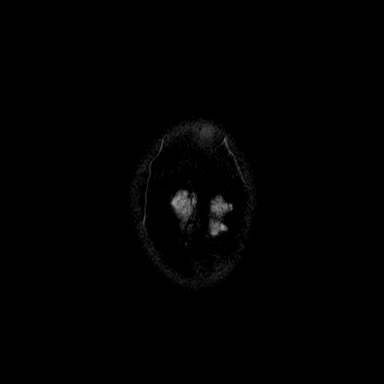

[Series 8: T1 · axial · 1.0mm · 0.75mm/px · z∈[-83,+76]mm · 11 of 160 slices shown]
[im 1/160]
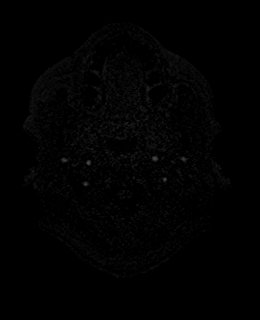
[im 16/160]
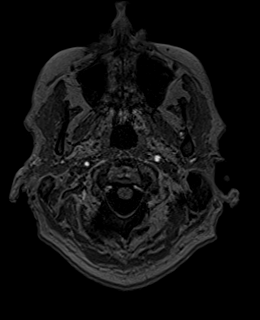
[im 32/160]
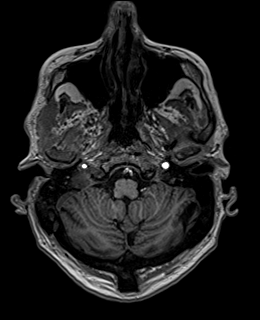
[im 48/160]
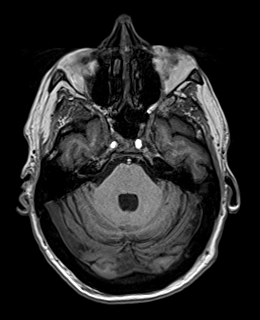
[im 64/160]
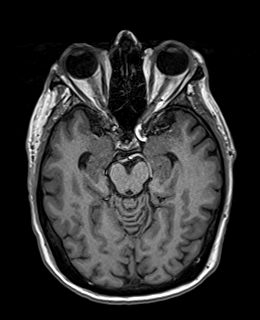
[im 80/160]
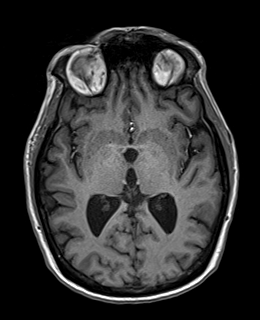
[im 96/160]
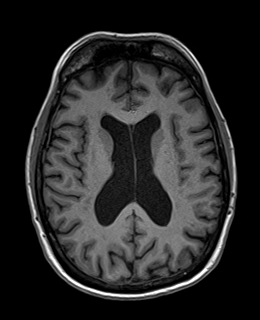
[im 112/160]
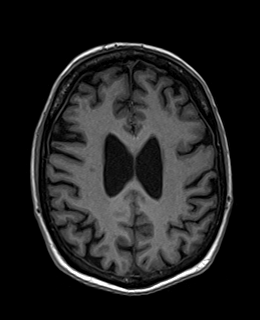
[im 128/160]
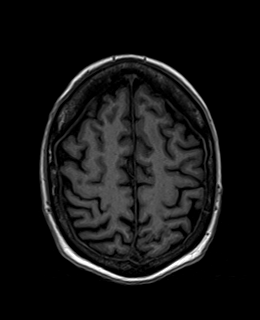
[im 144/160]
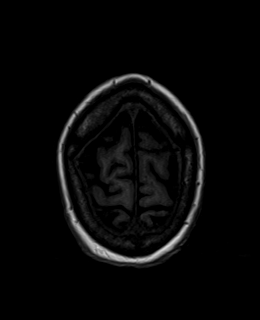
[im 160/160]
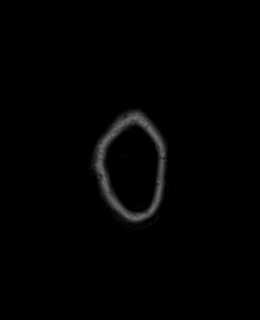

[Series 9: T2 post-contrast · coronal · 3.0mm · 0.57mm/px · 3 of 47 slices shown]
[im 1/47]
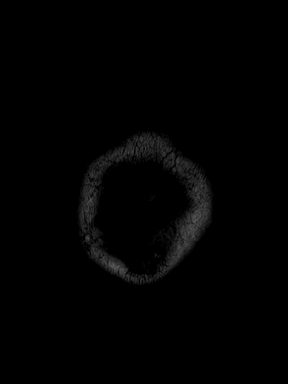
[im 24/47]
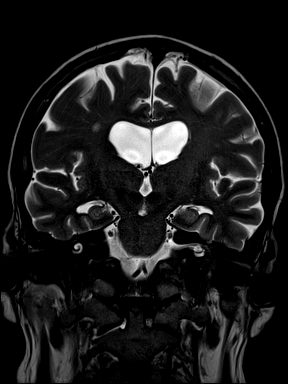
[im 47/47]
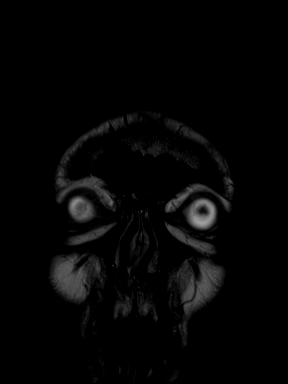

[Series 10: T1 post-contrast · axial · 1.0mm · 0.75mm/px · z∈[-83,+76]mm · 11 of 160 slices shown (1 of 2)]
[im 1/160]
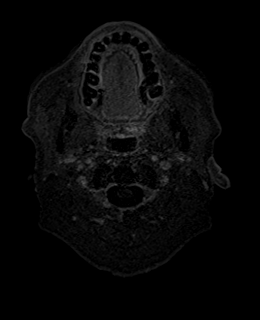
[im 16/160]
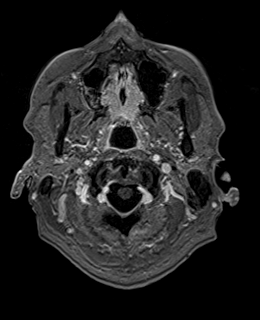
[im 32/160]
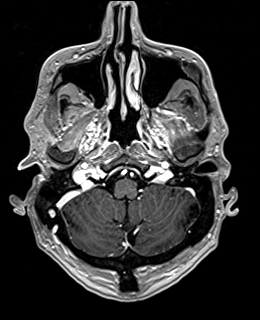
[im 48/160]
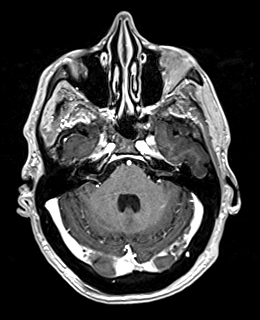
[im 64/160]
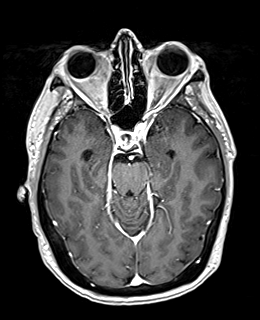
[im 80/160]
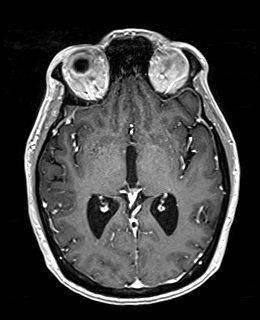
[im 96/160]
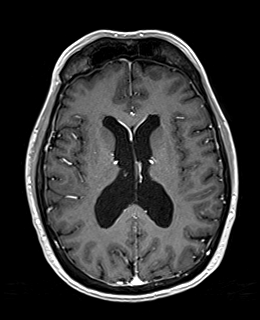
[im 112/160]
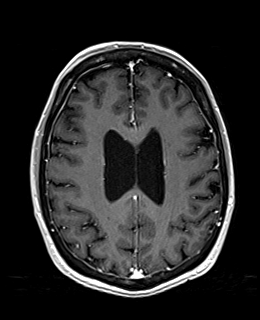
[im 128/160]
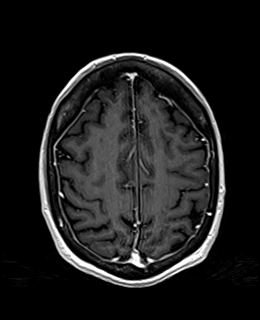
[im 144/160]
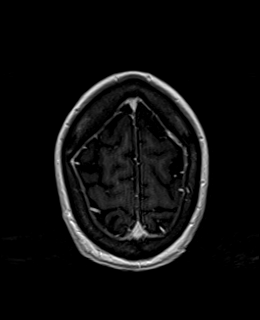
[im 160/160]
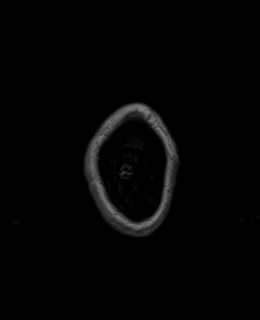

[Series 11: T1 post-contrast · coronal · 3.0mm · 0.57mm/px · 3 of 47 slices shown (2 of 2)]
[im 1/47]
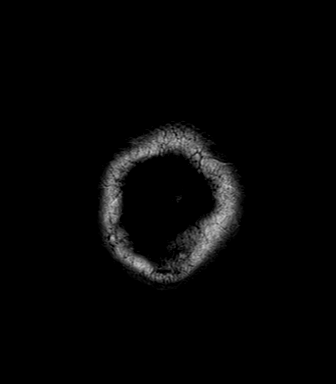
[im 24/47]
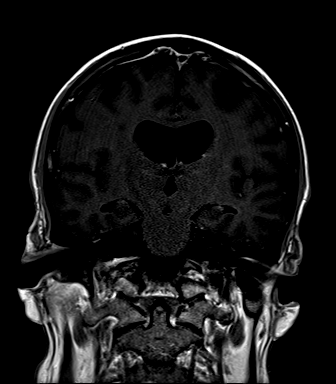
[im 47/47]
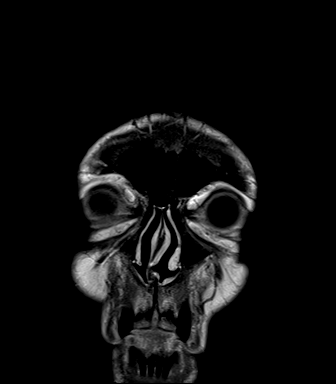

[Series 12: FLAIR post-contrast · sagittal · 3.0mm · 0.75mm/px · 3 of 39 slices shown]
[im 1/39]
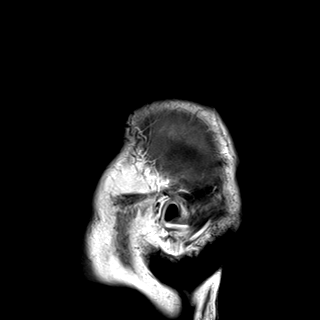
[im 20/39]
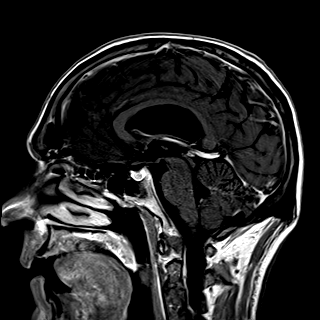
[im 39/39]
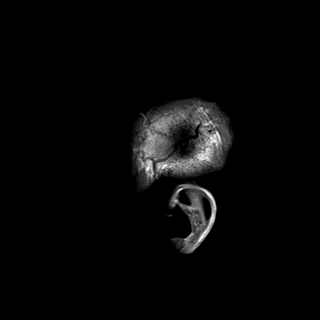

[48 of 48 positions shown; findings below may reference images not displayed]

FINDINGS: Brain: No new or recurrent tumor identified in the brain. Right
frontal cortical lesion no longer enhances and is unchanged. Right
lateral cerebellar lesion no longer enhances and is unchanged.
Multiple other treated lesions no longer enhance.

Mild ventricular enlargement is stable. Mild chronic white matter
changes compatible with chronic microvascular ischemia and treatment
effect. No hemorrhage or edema. Leptomeningeal enhancement is
normal.

Vascular: Normal arterial flow voids

Skull and upper cervical spine: Negative

Sinuses/Orbits: Mild mucosal edema paranasal sinuses.  Normal orbit

Other: None
IMPRESSION: No significant change from the prior MRI. No new or recurrent
enhancing lesions identified. Leptomeningeal enhancement normal.

## 2019-09-28 ENCOUNTER — Encounter (INDEPENDENT_AMBULATORY_CARE_PROVIDER_SITE_OTHER): Payer: Self-pay

## 2020-10-06 DEATH — deceased
